# Patient Record
Sex: Female | Born: 1935 | Race: White | Hispanic: No | Smoking: Former smoker
Health system: Southern US, Community
[De-identification: ages and names within clinical notes are randomized; demographics above are authoritative.]

## PROBLEM LIST (undated history)

## (undated) DIAGNOSIS — I82409 Acute embolism and thrombosis of unspecified deep veins of unspecified lower extremity: Secondary | ICD-10-CM

## (undated) DIAGNOSIS — K5730 Diverticulosis of large intestine without perforation or abscess without bleeding: Secondary | ICD-10-CM

## (undated) DIAGNOSIS — I442 Atrioventricular block, complete: Secondary | ICD-10-CM

## (undated) DIAGNOSIS — K5520 Angiodysplasia of colon without hemorrhage: Secondary | ICD-10-CM

## (undated) DIAGNOSIS — E538 Deficiency of other specified B group vitamins: Secondary | ICD-10-CM

## (undated) DIAGNOSIS — R739 Hyperglycemia, unspecified: Secondary | ICD-10-CM

## (undated) DIAGNOSIS — Z860100 Personal history of colon polyps, unspecified: Secondary | ICD-10-CM

## (undated) DIAGNOSIS — F32A Depression, unspecified: Secondary | ICD-10-CM

## (undated) DIAGNOSIS — E119 Type 2 diabetes mellitus without complications: Secondary | ICD-10-CM

## (undated) DIAGNOSIS — I10 Essential (primary) hypertension: Secondary | ICD-10-CM

## (undated) DIAGNOSIS — I714 Abdominal aortic aneurysm, without rupture, unspecified: Secondary | ICD-10-CM

## (undated) DIAGNOSIS — E785 Hyperlipidemia, unspecified: Secondary | ICD-10-CM

## (undated) DIAGNOSIS — J449 Chronic obstructive pulmonary disease, unspecified: Secondary | ICD-10-CM

## (undated) DIAGNOSIS — N390 Urinary tract infection, site not specified: Secondary | ICD-10-CM

## (undated) DIAGNOSIS — I509 Heart failure, unspecified: Secondary | ICD-10-CM

## (undated) DIAGNOSIS — F329 Major depressive disorder, single episode, unspecified: Secondary | ICD-10-CM

## (undated) DIAGNOSIS — K219 Gastro-esophageal reflux disease without esophagitis: Secondary | ICD-10-CM

## (undated) DIAGNOSIS — I4892 Unspecified atrial flutter: Secondary | ICD-10-CM

## (undated) DIAGNOSIS — N179 Acute kidney failure, unspecified: Secondary | ICD-10-CM

## (undated) DIAGNOSIS — F419 Anxiety disorder, unspecified: Secondary | ICD-10-CM

## (undated) HISTORY — DX: Depression, unspecified: F32.A

## (undated) HISTORY — PX: BLADDER SUSPENSION: SHX72

## (undated) HISTORY — DX: Type 2 diabetes mellitus without complications: E11.9

## (undated) HISTORY — DX: Heart failure, unspecified: I50.9

## (undated) HISTORY — DX: Major depressive disorder, single episode, unspecified: F32.9

## (undated) HISTORY — DX: Abdominal aortic aneurysm, without rupture, unspecified: I71.40

## (undated) HISTORY — DX: Essential (primary) hypertension: I10

## (undated) HISTORY — DX: Deficiency of other specified B group vitamins: E53.8

## (undated) HISTORY — PX: APPENDECTOMY: SHX54

## (undated) HISTORY — DX: Atrioventricular block, complete: I44.2

## (undated) HISTORY — PX: ABDOMINAL HYSTERECTOMY: SHX81

## (undated) HISTORY — DX: Unspecified atrial flutter: I48.92

## (undated) HISTORY — DX: Anxiety disorder, unspecified: F41.9

## (undated) HISTORY — DX: Hyperlipidemia, unspecified: E78.5

## (undated) HISTORY — DX: Urinary tract infection, site not specified: N39.0

## (undated) HISTORY — PX: TUBAL LIGATION: SHX77

## (undated) HISTORY — PX: PACEMAKER INSERTION: SHX728

## (undated) HISTORY — DX: Acute kidney failure, unspecified: N17.9

## (undated) HISTORY — DX: Hyperglycemia, unspecified: R73.9

## (undated) HISTORY — DX: Personal history of colonic polyps: Z86.010

## (undated) HISTORY — DX: Abdominal aortic aneurysm, without rupture: I71.4

## (undated) HISTORY — DX: Diverticulosis of large intestine without perforation or abscess without bleeding: K57.30

## (undated) HISTORY — DX: Acute embolism and thrombosis of unspecified deep veins of unspecified lower extremity: I82.409

## (undated) HISTORY — DX: Personal history of colon polyps, unspecified: Z86.0100

## (undated) HISTORY — DX: Chronic obstructive pulmonary disease, unspecified: J44.9

## (undated) HISTORY — DX: Gastro-esophageal reflux disease without esophagitis: K21.9

## (undated) HISTORY — DX: Angiodysplasia of colon without hemorrhage: K55.20

---

## 1997-11-25 ENCOUNTER — Ambulatory Visit (HOSPITAL_COMMUNITY): Admission: RE | Admit: 1997-11-25 | Discharge: 1997-11-25 | Payer: Self-pay | Admitting: Internal Medicine

## 1998-01-23 ENCOUNTER — Ambulatory Visit (HOSPITAL_COMMUNITY): Admission: RE | Admit: 1998-01-23 | Discharge: 1998-01-23 | Payer: Self-pay | Admitting: Gastroenterology

## 1999-02-13 ENCOUNTER — Ambulatory Visit (HOSPITAL_COMMUNITY): Admission: RE | Admit: 1999-02-13 | Discharge: 1999-02-13 | Payer: Self-pay | Admitting: Internal Medicine

## 1999-02-13 ENCOUNTER — Encounter: Payer: Self-pay | Admitting: Internal Medicine

## 2000-02-29 ENCOUNTER — Ambulatory Visit (HOSPITAL_COMMUNITY): Admission: RE | Admit: 2000-02-29 | Discharge: 2000-02-29 | Payer: Self-pay | Admitting: Internal Medicine

## 2000-02-29 ENCOUNTER — Encounter: Payer: Self-pay | Admitting: Internal Medicine

## 2000-09-14 ENCOUNTER — Encounter: Payer: Self-pay | Admitting: Emergency Medicine

## 2000-09-14 ENCOUNTER — Inpatient Hospital Stay (HOSPITAL_COMMUNITY): Admission: EM | Admit: 2000-09-14 | Discharge: 2000-09-17 | Payer: Self-pay | Admitting: Emergency Medicine

## 2000-09-16 ENCOUNTER — Encounter: Payer: Self-pay | Admitting: Internal Medicine

## 2001-03-13 ENCOUNTER — Ambulatory Visit (HOSPITAL_COMMUNITY): Admission: RE | Admit: 2001-03-13 | Discharge: 2001-03-13 | Payer: Self-pay | Admitting: Internal Medicine

## 2001-03-13 ENCOUNTER — Encounter: Payer: Self-pay | Admitting: Internal Medicine

## 2002-02-06 ENCOUNTER — Encounter (INDEPENDENT_AMBULATORY_CARE_PROVIDER_SITE_OTHER): Payer: Self-pay | Admitting: Gastroenterology

## 2002-02-06 HISTORY — PX: COLONOSCOPY: SHX174

## 2002-04-27 ENCOUNTER — Encounter: Payer: Self-pay | Admitting: Internal Medicine

## 2002-04-27 ENCOUNTER — Ambulatory Visit (HOSPITAL_COMMUNITY): Admission: RE | Admit: 2002-04-27 | Discharge: 2002-04-27 | Payer: Self-pay | Admitting: Internal Medicine

## 2002-05-10 ENCOUNTER — Encounter: Admission: RE | Admit: 2002-05-10 | Discharge: 2002-05-10 | Payer: Self-pay | Admitting: Internal Medicine

## 2002-05-10 ENCOUNTER — Encounter: Payer: Self-pay | Admitting: Internal Medicine

## 2002-05-25 ENCOUNTER — Encounter: Admission: RE | Admit: 2002-05-25 | Discharge: 2002-05-25 | Payer: Self-pay | Admitting: Surgery

## 2002-05-25 ENCOUNTER — Encounter: Payer: Self-pay | Admitting: Surgery

## 2002-05-25 ENCOUNTER — Ambulatory Visit (HOSPITAL_BASED_OUTPATIENT_CLINIC_OR_DEPARTMENT_OTHER): Admission: RE | Admit: 2002-05-25 | Discharge: 2002-05-25 | Payer: Self-pay | Admitting: Surgery

## 2002-05-25 ENCOUNTER — Encounter (INDEPENDENT_AMBULATORY_CARE_PROVIDER_SITE_OTHER): Payer: Self-pay | Admitting: Specialist

## 2002-09-30 ENCOUNTER — Inpatient Hospital Stay (HOSPITAL_COMMUNITY): Admission: EM | Admit: 2002-09-30 | Discharge: 2002-10-02 | Payer: Self-pay

## 2003-07-10 ENCOUNTER — Ambulatory Visit (HOSPITAL_COMMUNITY): Admission: RE | Admit: 2003-07-10 | Discharge: 2003-07-10 | Payer: Self-pay | Admitting: Internal Medicine

## 2004-08-04 ENCOUNTER — Ambulatory Visit (HOSPITAL_COMMUNITY): Admission: RE | Admit: 2004-08-04 | Discharge: 2004-08-04 | Payer: Self-pay | Admitting: Internal Medicine

## 2004-09-15 ENCOUNTER — Encounter: Admission: RE | Admit: 2004-09-15 | Discharge: 2004-09-15 | Payer: Self-pay | Admitting: Internal Medicine

## 2005-03-17 ENCOUNTER — Encounter: Admission: RE | Admit: 2005-03-17 | Discharge: 2005-03-17 | Payer: Self-pay | Admitting: Internal Medicine

## 2005-05-28 ENCOUNTER — Encounter: Admission: RE | Admit: 2005-05-28 | Discharge: 2005-05-28 | Payer: Self-pay | Admitting: Orthopedic Surgery

## 2005-06-02 ENCOUNTER — Encounter (INDEPENDENT_AMBULATORY_CARE_PROVIDER_SITE_OTHER): Payer: Self-pay | Admitting: Specialist

## 2005-06-02 ENCOUNTER — Ambulatory Visit (HOSPITAL_BASED_OUTPATIENT_CLINIC_OR_DEPARTMENT_OTHER): Admission: RE | Admit: 2005-06-02 | Discharge: 2005-06-02 | Payer: Self-pay | Admitting: Orthopedic Surgery

## 2005-06-02 ENCOUNTER — Ambulatory Visit (HOSPITAL_COMMUNITY): Admission: RE | Admit: 2005-06-02 | Discharge: 2005-06-02 | Payer: Self-pay | Admitting: Orthopedic Surgery

## 2005-08-30 ENCOUNTER — Ambulatory Visit (HOSPITAL_COMMUNITY): Admission: RE | Admit: 2005-08-30 | Discharge: 2005-08-30 | Payer: Self-pay | Admitting: Internal Medicine

## 2006-05-05 ENCOUNTER — Ambulatory Visit (HOSPITAL_BASED_OUTPATIENT_CLINIC_OR_DEPARTMENT_OTHER): Admission: RE | Admit: 2006-05-05 | Discharge: 2006-05-05 | Payer: Self-pay | Admitting: Orthopedic Surgery

## 2006-07-05 ENCOUNTER — Encounter: Admission: RE | Admit: 2006-07-05 | Discharge: 2006-07-05 | Payer: Self-pay | Admitting: Internal Medicine

## 2006-09-14 ENCOUNTER — Ambulatory Visit (HOSPITAL_COMMUNITY): Admission: RE | Admit: 2006-09-14 | Discharge: 2006-09-14 | Payer: Self-pay | Admitting: Internal Medicine

## 2006-09-28 ENCOUNTER — Encounter: Admission: RE | Admit: 2006-09-28 | Discharge: 2006-09-28 | Payer: Self-pay | Admitting: Internal Medicine

## 2007-03-30 ENCOUNTER — Encounter: Admission: RE | Admit: 2007-03-30 | Discharge: 2007-03-30 | Payer: Self-pay | Admitting: Internal Medicine

## 2007-05-08 ENCOUNTER — Ambulatory Visit: Payer: Self-pay | Admitting: Internal Medicine

## 2007-06-19 ENCOUNTER — Ambulatory Visit (HOSPITAL_COMMUNITY): Admission: RE | Admit: 2007-06-19 | Discharge: 2007-06-19 | Payer: Self-pay | Admitting: Internal Medicine

## 2007-11-09 ENCOUNTER — Ambulatory Visit (HOSPITAL_COMMUNITY): Admission: RE | Admit: 2007-11-09 | Discharge: 2007-11-09 | Payer: Self-pay | Admitting: Internal Medicine

## 2007-11-10 DIAGNOSIS — K5730 Diverticulosis of large intestine without perforation or abscess without bleeding: Secondary | ICD-10-CM | POA: Insufficient documentation

## 2007-11-10 DIAGNOSIS — I714 Abdominal aortic aneurysm, without rupture, unspecified: Secondary | ICD-10-CM | POA: Insufficient documentation

## 2007-11-10 DIAGNOSIS — F341 Dysthymic disorder: Secondary | ICD-10-CM

## 2007-11-10 DIAGNOSIS — J45909 Unspecified asthma, uncomplicated: Secondary | ICD-10-CM | POA: Insufficient documentation

## 2008-05-15 ENCOUNTER — Ambulatory Visit (HOSPITAL_COMMUNITY): Admission: RE | Admit: 2008-05-15 | Discharge: 2008-05-15 | Payer: Self-pay | Admitting: Internal Medicine

## 2008-05-30 ENCOUNTER — Ambulatory Visit: Payer: Self-pay | Admitting: Internal Medicine

## 2008-06-05 ENCOUNTER — Encounter: Admission: RE | Admit: 2008-06-05 | Discharge: 2008-06-05 | Payer: Self-pay | Admitting: Internal Medicine

## 2008-06-12 ENCOUNTER — Encounter: Payer: Self-pay | Admitting: Adult Health

## 2008-06-12 ENCOUNTER — Ambulatory Visit: Payer: Self-pay | Admitting: Internal Medicine

## 2008-06-14 ENCOUNTER — Ambulatory Visit: Payer: Self-pay | Admitting: Cardiovascular Disease

## 2008-06-14 LAB — CONVERTED CEMR LAB
BUN: 14 mg/dL (ref 6–23)
Basophils Absolute: 0 10*3/uL (ref 0.0–0.1)
Chloride: 105 meq/L (ref 96–112)
Eosinophils Absolute: 0.1 10*3/uL (ref 0.0–0.7)
Eosinophils Relative: 1.6 % (ref 0.0–5.0)
GFR calc Af Amer: 47 mL/min
GFR calc non Af Amer: 39 mL/min
HCT: 40.9 % (ref 36.0–46.0)
MCHC: 34.4 g/dL (ref 30.0–36.0)
MCV: 94.5 fL (ref 78.0–100.0)
Monocytes Absolute: 0.5 10*3/uL (ref 0.1–1.0)
Neutrophils Relative %: 63.1 % (ref 43.0–77.0)
Platelets: 110 10*3/uL — ABNORMAL LOW (ref 150–400)
Potassium: 4 meq/L (ref 3.5–5.1)
Pro B Natriuretic peptide (BNP): 669 pg/mL — ABNORMAL HIGH (ref 0.0–100.0)
RDW: 13.3 % (ref 11.5–14.6)

## 2008-06-18 ENCOUNTER — Encounter: Payer: Self-pay | Admitting: Cardiovascular Disease

## 2008-06-18 ENCOUNTER — Ambulatory Visit: Payer: Self-pay | Admitting: Cardiovascular Disease

## 2008-06-18 ENCOUNTER — Inpatient Hospital Stay (HOSPITAL_COMMUNITY): Admission: AD | Admit: 2008-06-18 | Discharge: 2008-06-21 | Payer: Self-pay | Admitting: Cardiovascular Disease

## 2008-06-18 ENCOUNTER — Ambulatory Visit: Payer: Self-pay

## 2008-06-24 ENCOUNTER — Telehealth: Payer: Self-pay | Admitting: Internal Medicine

## 2008-07-04 ENCOUNTER — Ambulatory Visit: Payer: Self-pay

## 2008-07-04 ENCOUNTER — Ambulatory Visit: Payer: Self-pay | Admitting: Cardiovascular Disease

## 2008-07-22 ENCOUNTER — Ambulatory Visit: Payer: Self-pay | Admitting: Internal Medicine

## 2008-07-24 ENCOUNTER — Ambulatory Visit: Payer: Self-pay | Admitting: Internal Medicine

## 2008-07-24 LAB — CONVERTED CEMR LAB
Chloride: 107 meq/L (ref 96–112)
Eosinophils Absolute: 0.1 10*3/uL (ref 0.0–0.7)
GFR calc Af Amer: 44 mL/min
GFR calc non Af Amer: 36 mL/min
HCT: 38 % (ref 36.0–46.0)
INR: 1 (ref 0.8–1.0)
MCV: 94.4 fL (ref 78.0–100.0)
Monocytes Absolute: 0.4 10*3/uL (ref 0.1–1.0)
Monocytes Relative: 7.1 % (ref 3.0–12.0)
Neutrophils Relative %: 61.3 % (ref 43.0–77.0)
Platelets: 137 10*3/uL — ABNORMAL LOW (ref 150–400)
Potassium: 4.4 meq/L (ref 3.5–5.1)
RDW: 13 % (ref 11.5–14.6)
Sodium: 143 meq/L (ref 135–145)
WBC: 6 10*3/uL (ref 4.5–10.5)
aPTT: 26.4 s (ref 21.7–29.8)

## 2008-08-01 ENCOUNTER — Ambulatory Visit: Payer: Self-pay | Admitting: Internal Medicine

## 2008-08-01 ENCOUNTER — Inpatient Hospital Stay (HOSPITAL_COMMUNITY): Admission: RE | Admit: 2008-08-01 | Discharge: 2008-08-02 | Payer: Self-pay | Admitting: Internal Medicine

## 2008-08-14 ENCOUNTER — Ambulatory Visit: Payer: Self-pay

## 2008-08-20 ENCOUNTER — Ambulatory Visit (HOSPITAL_COMMUNITY): Admission: RE | Admit: 2008-08-20 | Discharge: 2008-08-20 | Payer: Self-pay | Admitting: Internal Medicine

## 2008-08-26 ENCOUNTER — Encounter: Payer: Self-pay | Admitting: Internal Medicine

## 2008-09-02 ENCOUNTER — Encounter: Admission: RE | Admit: 2008-09-02 | Discharge: 2008-09-02 | Payer: Self-pay | Admitting: Internal Medicine

## 2008-09-23 ENCOUNTER — Encounter (INDEPENDENT_AMBULATORY_CARE_PROVIDER_SITE_OTHER): Payer: Self-pay | Admitting: *Deleted

## 2008-10-15 ENCOUNTER — Ambulatory Visit: Payer: Self-pay | Admitting: Internal Medicine

## 2008-10-25 ENCOUNTER — Ambulatory Visit: Payer: Self-pay | Admitting: Internal Medicine

## 2008-11-19 ENCOUNTER — Telehealth: Payer: Self-pay | Admitting: Internal Medicine

## 2008-12-17 ENCOUNTER — Ambulatory Visit: Payer: Self-pay | Admitting: Internal Medicine

## 2008-12-17 DIAGNOSIS — Z860100 Personal history of colon polyps, unspecified: Secondary | ICD-10-CM | POA: Insufficient documentation

## 2009-01-01 ENCOUNTER — Telehealth: Payer: Self-pay | Admitting: Internal Medicine

## 2009-01-22 DIAGNOSIS — I509 Heart failure, unspecified: Secondary | ICD-10-CM | POA: Insufficient documentation

## 2009-01-22 DIAGNOSIS — I442 Atrioventricular block, complete: Secondary | ICD-10-CM

## 2009-01-22 DIAGNOSIS — Z950 Presence of cardiac pacemaker: Secondary | ICD-10-CM | POA: Insufficient documentation

## 2009-01-23 ENCOUNTER — Encounter (INDEPENDENT_AMBULATORY_CARE_PROVIDER_SITE_OTHER): Payer: Self-pay | Admitting: *Deleted

## 2009-01-23 ENCOUNTER — Ambulatory Visit: Payer: Self-pay | Admitting: Internal Medicine

## 2009-02-12 ENCOUNTER — Encounter: Payer: Self-pay | Admitting: Internal Medicine

## 2009-04-08 ENCOUNTER — Ambulatory Visit (HOSPITAL_COMMUNITY): Admission: RE | Admit: 2009-04-08 | Discharge: 2009-04-08 | Payer: Self-pay | Admitting: Internal Medicine

## 2009-05-05 ENCOUNTER — Encounter: Payer: Self-pay | Admitting: Internal Medicine

## 2009-05-05 ENCOUNTER — Ambulatory Visit (HOSPITAL_COMMUNITY): Admission: RE | Admit: 2009-05-05 | Discharge: 2009-05-05 | Payer: Self-pay | Admitting: Internal Medicine

## 2009-05-07 ENCOUNTER — Ambulatory Visit (HOSPITAL_COMMUNITY): Admission: RE | Admit: 2009-05-07 | Discharge: 2009-05-07 | Payer: Self-pay | Admitting: Internal Medicine

## 2009-05-07 ENCOUNTER — Encounter: Payer: Self-pay | Admitting: Internal Medicine

## 2009-05-08 ENCOUNTER — Ambulatory Visit: Payer: Self-pay

## 2009-05-08 ENCOUNTER — Encounter: Payer: Self-pay | Admitting: Internal Medicine

## 2009-05-09 ENCOUNTER — Telehealth: Payer: Self-pay | Admitting: Internal Medicine

## 2009-05-15 ENCOUNTER — Ambulatory Visit: Payer: Self-pay | Admitting: Internal Medicine

## 2009-05-27 ENCOUNTER — Encounter: Admission: RE | Admit: 2009-05-27 | Discharge: 2009-05-27 | Payer: Self-pay | Admitting: Internal Medicine

## 2009-06-26 ENCOUNTER — Ambulatory Visit: Payer: Self-pay | Admitting: Internal Medicine

## 2009-07-30 ENCOUNTER — Telehealth: Payer: Self-pay | Admitting: Internal Medicine

## 2009-08-01 ENCOUNTER — Inpatient Hospital Stay (HOSPITAL_COMMUNITY): Admission: EM | Admit: 2009-08-01 | Discharge: 2009-08-02 | Payer: Self-pay | Admitting: Emergency Medicine

## 2009-08-01 ENCOUNTER — Ambulatory Visit: Payer: Self-pay | Admitting: Vascular Surgery

## 2009-08-01 ENCOUNTER — Encounter (INDEPENDENT_AMBULATORY_CARE_PROVIDER_SITE_OTHER): Payer: Self-pay | Admitting: Emergency Medicine

## 2009-08-08 ENCOUNTER — Telehealth: Payer: Self-pay | Admitting: Cardiovascular Disease

## 2009-09-03 ENCOUNTER — Encounter: Payer: Self-pay | Admitting: Internal Medicine

## 2009-09-03 ENCOUNTER — Ambulatory Visit: Payer: Self-pay | Admitting: Vascular Surgery

## 2009-10-07 ENCOUNTER — Ambulatory Visit: Payer: Self-pay | Admitting: Internal Medicine

## 2009-10-08 LAB — CONVERTED CEMR LAB
Chloride: 102 meq/L (ref 96–112)
GFR calc non Af Amer: 35.9 mL/min (ref >60)
Potassium: 3.7 meq/L (ref 3.5–5.1)
Pro B Natriuretic peptide (BNP): 579 pg/mL — ABNORMAL HIGH (ref 0.0–100.0)
Sodium: 142 meq/L (ref 135–145)

## 2009-11-26 ENCOUNTER — Ambulatory Visit: Payer: Self-pay | Admitting: Cardiology

## 2010-04-01 ENCOUNTER — Ambulatory Visit: Payer: Self-pay

## 2010-04-01 ENCOUNTER — Encounter: Payer: Self-pay | Admitting: Internal Medicine

## 2010-06-01 ENCOUNTER — Ambulatory Visit (HOSPITAL_COMMUNITY): Admission: RE | Admit: 2010-06-01 | Discharge: 2010-06-01 | Payer: Self-pay | Admitting: Internal Medicine

## 2010-09-13 ENCOUNTER — Encounter: Payer: Self-pay | Admitting: Internal Medicine

## 2010-09-22 NOTE — Procedures (Signed)
Summary: DEVICE/SAF   Current Medications (verified): 1)  Aspirin Adult Low Strength 81 Mg Tbec (Aspirin) .... Once Daily 2)  Singulair 10 Mg Tabs (Montelukast Sodium) .Marland Kitchen.. 1 Once Daily 3)  Nexium 40 Mg Cpdr (Esomeprazole Magnesium) .... Take  One 30-60 Min Before First Meal of The Day 4)  Simvastatin 80 Mg Tabs (Simvastatin) .... 1/2 Tablet On Mon Wed and Fri 5)  Paroxetine Hcl 20 Mg Tabs (Paroxetine Hcl) .... One Tablet Once Daily 6)  Benicar Hct 20-12.5 Mg Tabs (Olmesartan Medoxomil-Hctz) .Marland Kitchen.. 1 Once Daily 7)  Vitamin D 2000 Unit Tabs (Cholecalciferol) .... Two Times A Day 8)  Spiriva Handihaler 18 Mcg Caps (Tiotropium Bromide Monohydrate) .... Inhale Contents of 1 Capsule Daily 9)  Duoneb 0.5-2.5 (3) Mg/18ml Soln (Ipratropium-Albuterol) .Marland Kitchen.. 1 Vial Two Times A Day 10)  Warfarin Sodium 5 Mg Tabs (Warfarin Sodium) .... Use As Directed By Anticoagulation Clinic 11)  Ventolin Hfa 108 (90 Base) Mcg/act Aers (Albuterol Sulfate) .... As Needed  Allergies (verified): 1)  ! * Mycins 2)  ! Sulfa  PPM Specifications Following MD:  Lewayne Bunting, MD     PPM Vendor:  Medtronic     PPM Model Number:  [id number]     PPM Serial Number:  [id number] PPM DOI:  06/19/2008      Lead 1    Location: RA     DOI: 06/19/2008     Model #: 4696     Serial #: [id number]     Status: active Lead 2    Location: RV     DOI: 06/19/2008     Model #: 4010     Serial #: [id number]     Status: active Lead 3    Location: LV     DOI: 06/19/2008     Model #: 3474     Serial #: [id number]     Status: active  Magnet Response Rate:  BOL 85 ERI  65  Indications:  CHF  Explantation Comments:  LV LEAD OFF Pacemaker dependent  PPM Follow Up Battery Voltage:  3.005 V     Battery Est. Longevity:  7 yrs     Pacer Dependent:  Yes       PPM Device Measurements Atrium  Amplitude: 2.80 mV, Impedance: 432 ohms, Threshold: 1.00 V at 0.40 msec Right Ventricle  Amplitude: 11.20 mV, Impedance: 560 ohms, Threshold: 0.50 V at 0.40  msec  Episodes MS Episodes:  4     Percent Mode Switch:  0%     Coumadin:  Yes Ventricular High Rate:  0     Atrial Pacing:  1.6%     Ventricular Pacing:  98.4%  Parameters Mode:  DDD     Lower Rate Limit:  50     Upper Rate Limit:  130 Paced AV Delay:  310     Sensed AV Delay:  280 Next Cardiology Appt Due:  09/23/2010 Tech Comments:  4 AHR EPISODES--LONGEST WAS 27 MINUTES 28 SECONDS. + COUMADIN.  NORMAL DEVICE FUNCTION.  NO CHANGES MADE.  ROV IN 6 MTHS W/GT. Vella Kohler  April 01, 2010 11:26 AM

## 2010-09-22 NOTE — Cardiovascular Report (Signed)
Summary: Office Visit   Office Visit   Imported By: Roderic Ovens 04/23/2010 15:49:03  _____________________________________________________________________  External Attachment:    Type:   Image     Comment:   External Document

## 2010-09-22 NOTE — Assessment & Plan Note (Signed)
Summary: [facility]   Referring Provider:  Dr. Oneta Rack Primary Provider:  Lucky Cowboy, MD   History of Present Illness: The patient presents for followup of her cardiomyopathy. She has a history of heart block as well and has a biventricular pacemaker. She is very limited by dyspnea but has chronic severe lung disease. She is switching her primary cardiology care here as she lives in this area. She continues to be dyspneic with mild activity and wears oxygen almost 24 hours a day. She had no change in his isn't describing any PND or orthopnea. She is not describing any chest discomfort, neck or arm discomfort. She is not describing any new PND or orthopnea. She does have fatigue which is chronic.  Of note the patient offered to me that she is profoundly depressed. She reports not wanting to leave the house and not "caring about her family". She does not express any suicidal thoughts however.  Current Medications (verified): 1)  Aspirin Adult Low Strength 81 Mg Tbec (Aspirin) .... Once Daily 2)  Singulair 10 Mg Tabs (Montelukast Sodium) .Marland Kitchen.. 1 Once Daily 3)  Nexium 40 Mg Cpdr (Esomeprazole Magnesium) .... Take  One 30-60 Min Before First Meal of The Day 4)  Simvastatin 80 Mg Tabs (Simvastatin) .... 1/2 Tablet On Mon Wed and Fri 5)  Paroxetine Hcl 20 Mg Tabs (Paroxetine Hcl) .... One Tablet Once Daily 6)  Benicar Hct 20-12.5 Mg Tabs (Olmesartan Medoxomil-Hctz) .Marland Kitchen.. 1 Once Daily 7)  Vitamin D 2000 Unit Tabs (Cholecalciferol) .... Two Times A Day 8)  Spiriva Handihaler 18 Mcg Caps (Tiotropium Bromide Monohydrate) .... Inhale Contents of 1 Capsule Daily 9)  Duoneb 0.5-2.5 (3) Mg/89ml Soln (Ipratropium-Albuterol) .Marland Kitchen.. 1 Vial Two Times A Day 10)  Warfarin Sodium 5 Mg Tabs (Warfarin Sodium) .... Use As Directed By Anticoagulation Clinic 11)  Ventolin Hfa 108 (90 Base) Mcg/act Aers (Albuterol Sulfate) .... As Needed  Allergies (verified): 1)  ! * Mycins 2)  ! Sulfa  Past  History:  Past Medical History: Reviewed history from 01/22/2009 and no changes required. AV BLOCK, COMPLETE (ICD-426.0) CONGESTIVE HEART FAILURE, UNSPECIFIED (ICD-428.0) PACEMAKER, PERMANENT (ICD-V45.01) COPD UNSPECIFIED (ICD-496) ABDOMINAL AORTIC ANEURYSM (ICD-441.4) DYSLIPIDEMIA (ICD-272.4) HYPERTENSION (ICD-401.9) PERSONAL HX COLONIC POLYPS (ICD-V12.72) ANXIETY DEPRESSION (ICD-300.4) DIABETES MELLITUS (ICD-250.00) GERD (ICD-530.81) DIVERTICULOSIS, COLON (ICD-562.10) NEOPLASM, MALIGNANT, COLON, FAMILY HX, SIBLING (ICD-V16.0) ASTHMA (ICD-493.90)    Past Surgical History: Reviewed history from 01/22/2009 and no changes required. Tubal Ligation Hysterectomy w/ bladder suspension Appendectomy  Review of Systems       As stated in the HPI and negative for all other systems.   Vital Signs:  Patient profile:   77 year old female Height:      63 inches Weight:      192 pounds BMI:     34.13 Pulse rate:   91 / minute Resp:     20 per minute BP sitting:   104 / 58  (right arm)  Vitals Entered By: Marrion Coy, CNA (November 26, 2009 11:48 AM)  Physical Exam  General:  chronically ill-appearing in no acute distress Head:  normocephalic and atraumatic Eyes:  PERRLA/EOM intact; conjunctiva and lids normal. Neck:  Neck supple, no JVD. No masses, thyromegaly or abnormal cervical nodes. Chest Wall:  well-healed ICD Lungs:  decreased breath sounds with no wheezes or crackles Heart:  distant heart sounds, S1 and S2 within normal limits, no S3, no S4, no clicks, no rubs, no murmu Abdomen:  Bowel sounds positive; abdomen soft and non-tender  without masses, organomegaly, or hernias noted. No hepatosplenomegaly. Msk:  Back normal, normal gait. Muscle strength and tone normal. Extremities:  No clubbing or cyanosis. Neurologic:  Alert and oriented x 3. Skin:  Intact without lesions or rashes. Cervical Nodes:  no significant adenopathy Axillary Nodes:  no significant  adenopathy Inguinal Nodes:  no significant adenopathy Psych:  Normal affect.   EKG  Procedure date:  11/26/2009  Findings:      sinus rhythm, rate 91, ventricular pacing 100% capture  PPM Specifications Following MD:  Lewayne Bunting, MD     PPM Vendor:  [employer]     PPM Model Number:  [id number]     PPM Serial Number:  [id number] PPM DOI:  06/19/2008      Lead 1    Location: RA     DOI: 06/19/2008     Model #: 1191     Serial #: [id number]     Status: active Lead 2    Location: RV     DOI: 06/19/2008     Model #: 0865     Serial #: [id number]     Status: active Lead 3    Location: LV     DOI: 06/19/2008     Model #: 8413     Serial #: [id number]     Status: active  Magnet Response Rate:  BOL 85 ERI  65  Indications:  CHF  Explantation Comments:  LV LEAD OFF Pacemaker dependent  PPM Follow Up Pacer Dependent:  Yes      Episodes Coumadin:  Yes  Parameters Mode:  DDD     Lower Rate Limit:  50     Paced AV Delay:  310     Sensed AV Delay:  280  Impression & Recommendations:  Problem # 1:  CONGESTIVE HEART FAILURE, UNSPECIFIED (ICD-428.0) Her ejection fraction was mild to moderately reduced at the last evaluation. I don't believe she's hypervolemic. At this point and made no change to her cardiac regimen.  Problem # 2:  COPD UNSPECIFIED (ICD-496) It appears that her dyspnea is very likely in large part related to this. She is continuing pulmonary therapy as above. She no longer sees a pulmonologist as there was nothing left to offer apparently.  Of note her LV lead is turned off apparently secondary to diaphragmatic stimulation  Problem # 3:  ANXIETY DEPRESSION (ICD-300.4) The patient discussed her profound depression. I called her primary care physician today and reported this to him and he will notify the patient and consider changing her antidepressant therapy.  Problem # 4:  AV BLOCK, COMPLETE (ICD-426.0) She has followup in our pacemaker clinic. Tolerates  Coumadin. Orders: EKG w/ Interpretation (93000)  Patient Instructions: 1)  Your physician recommends that you schedule a follow-up appointment in: 6 months with Dr Antoine Poche in [place] 2)  Your physician recommends that you continue on your current medications as directed. Please refer to the Current Medication list given to you today.

## 2010-09-22 NOTE — Letter (Signed)
Summary: [facility]   Imported By: Roderic Ovens 09/17/2009 14:19:50  _____________________________________________________________________  External Attachment:    Type:   Image     Comment:   External Document

## 2010-09-22 NOTE — Assessment & Plan Note (Signed)
Summary: DEVICE/SAF   Referring Provider:  Dr. Oneta Rack Primary Provider:  Lucky Cowboy, MD   History of Present Illness: [name] returns today for followup.  She is a 77 yo woman with a h/o HTN, obesity, CHF, and COPD.  She has continued to have problems with SOB.  The patient has a h/o CHB and underwent insertion of a BiV PM over a year ago.  She has noted worsening SOB.  She had diaphragmatic stimulation and I turned her LV lead.  The patient has remained dyspneic.  She has been placed on Home oxygen.  She was placed on diuretics but these were stopped as she has worsening renal insufficiency.  Current Medications (verified): 1)  Aspirin Adult Low Strength 81 Mg Tbec (Aspirin) .... Once Daily 2)  Singulair 10 Mg Tabs (Montelukast Sodium) .Marland Kitchen.. 1 Once Daily 3)  Nexium 40 Mg Cpdr (Esomeprazole Magnesium) .... Take  One 30-60 Min Before First Meal of The Day 4)  Simvastatin 80 Mg Tabs (Simvastatin) .... 1/2 Tablet On Mon Wed and Fri 5)  Paroxetine Hcl 20 Mg Tabs (Paroxetine Hcl) .... One Tablet Once Daily 6)  Benicar Hct 20-12.5 Mg Tabs (Olmesartan Medoxomil-Hctz) .Marland Kitchen.. 1 Once Daily 7)  Vitamin D 2000 Unit Tabs (Cholecalciferol) .... Two Times A Day 8)  Spiriva Handihaler 18 Mcg Caps (Tiotropium Bromide Monohydrate) .... Inhale Contents of 1 Capsule Daily 9)  Duoneb 0.5-2.5 (3) Mg/45ml Soln (Ipratropium-Albuterol) .Marland Kitchen.. 1 Vial Two Times A Day 10)  Furosemide 40 Mg Tabs (Furosemide) .Marland Kitchen.. 1  Tablet Daily 11)  Fish Oil   Oil (Fish Oil) .... 1000mg  Once Daily 12)  Prednisone 10 Mg Tabs (Prednisone) .Marland Kitchen.. 1 Tab Once Daily 13)  Warfarin Sodium 5 Mg Tabs (Warfarin Sodium) .... Use As Directed By Anticoagulation Clinic  Allergies (verified): 1)  ! * Mycins 2)  ! Sulfa  Past History:  Past Medical History: Last updated: 01/22/2009 AV BLOCK, COMPLETE (ICD-426.0) CONGESTIVE HEART FAILURE, UNSPECIFIED (ICD-428.0) PACEMAKER, PERMANENT (ICD-V45.01) COPD UNSPECIFIED (ICD-496) ABDOMINAL AORTIC  ANEURYSM (ICD-441.4) DYSLIPIDEMIA (ICD-272.4) HYPERTENSION (ICD-401.9) PERSONAL HX COLONIC POLYPS (ICD-V12.72) ANXIETY DEPRESSION (ICD-300.4) DIABETES MELLITUS (ICD-250.00) GERD (ICD-530.81) DIVERTICULOSIS, COLON (ICD-562.10) NEOPLASM, MALIGNANT, COLON, FAMILY HX, SIBLING (ICD-V16.0) ASTHMA (ICD-493.90)    Past Surgical History: Last updated: 01/22/2009 Tubal Ligation Hysterectomy w/ bladder suspension Appendectomy  Vital Signs:  Patient profile:   77 year old female Height:      63 inches Weight:      194 pounds BMI:     34.49 Pulse rate:   96 / minute BP sitting:   121 / 76  (right arm) Cuff size:   regular  Vitals Entered By: Hardin Negus, RMA (October 07, 2009 2:27 PM)  Physical Exam  General:  Well developed, well nourished, in no acute distress. Head:  normocephalic and atraumatic Eyes:  PERRLA/EOM intact; conjunctiva and lids normal. Mouth:  Teeth, gums and palate normal. Oral mucosa normal. Neck:  Neck supple, no JVD. No masses, thyromegaly or abnormal cervical nodes. Chest Wall:  Well healed PPM incision. Lungs:  Scattered rales in the bases, without wheezes, rhonchi or increased work of breathing. Heart:  RRR with normal S1 and a split S2.  PMI is enlarged and laterally displaced. Abdomen:  Bowel sounds positive; abdomen soft and non-tender without masses, organomegaly, or hernias noted. No hepatosplenomegaly. Pulses:  pulses normal in all 4 extremities Extremities:  No clubbing or cyanosis.1+ edema bilaterally. Neurologic:  Alert and oriented x 3.   PPM Specifications Following MD:  Lewayne Bunting, MD  PPM Vendor:  [employer]     PPM Model Number:  [id number]     PPM Serial Number:  [id number] PPM DOI:  06/19/2008      Lead 1    Location: RA     DOI: 06/19/2008     Model #: 3474     Serial #: [id number]     Status: active Lead 2    Location: RV     DOI: 06/19/2008     Model #: 4332     Serial #: [id number]     Status: active Lead 3    Location: LV     DOI:  06/19/2008     Model #: 6301     Serial #: [id number]     Status: active  Magnet Response Rate:  BOL 85 ERI  65  Indications:  CHF  Explantation Comments:  LV LEAD OFF Pacemaker dependent  PPM Follow Up Remote Check?  No Battery Voltage:  3.002 V     Battery Est. Longevity:  6.5 years     Pacer Dependent:  Yes       PPM Device Measurements Atrium  Amplitude: 2.8 mV, Impedance: 409 ohms, Threshold: 1.5 V at 0.4 msec Right Ventricle  Impedance: 609 ohms, Threshold: 1.0 V at 0.4 msec  Episodes MS Episodes:  3     Percent Mode Switch:  0%     Coumadin:  Yes Ventricular High Rate:  0     Atrial Pacing:  1.3%     Ventricular Pacing:  99.1%  Parameters Mode:  DDD     Lower Rate Limit:  50     Paced AV Delay:  310     Sensed AV Delay:  280 Next Cardiology Appt Due:  12/21/2009 Tech Comments:  No parameter changes.  Device function normal.  ROV 6 months clinic. Altha Harm, LPN  October 07, 2009 2:35 PM  MD Comments:  Agree with above.  Her LV lead remains turned off.  Impression & Recommendations:  Problem # 1:  CONGESTIVE HEART FAILURE, UNSPECIFIED (ICD-428.0) Her CHF symptoms are more severe today.  I have asked her to take lasix for 3 days and then as needed.  She states that she has not been taking her lasix since I last saw her secondary to renal insufficiency.  See plan below. Her updated medication list for this problem includes:    Aspirin Adult Low Strength 81 Mg Tbec (Aspirin) ..... Once daily    Benicar Hct 20-12.5 Mg Tabs (Olmesartan medoxomil-hctz) .Marland Kitchen... 1 once daily    Furosemide 40 Mg Tabs (Furosemide) .Marland Kitchen... 1  tablet daily    Warfarin Sodium 5 Mg Tabs (Warfarin sodium) ..... Use as directed by anticoagulation clinic  Orders: TLB-BMP (Basic Metabolic Panel-BMET) (80048-METABOL) TLB-BNP (B-Natriuretic Peptide) (83880-BNPR) CHF Clinic  (CHF Clinic)  Problem # 2:  PACEMAKER, PERMANENT (ICD-V45.01) Her LV lead remains deactivated.  Will followup. With her other  comorbidities, I am not inclined to try at this time to revise her LV lead.  Problem # 3:  HYPERTENSION (ICD-401.9) A low sodium diet is recommended. Her updated medication list for this problem includes:    Aspirin Adult Low Strength 81 Mg Tbec (Aspirin) ..... Once daily    Benicar Hct 20-12.5 Mg Tabs (Olmesartan medoxomil-hctz) .Marland Kitchen... 1 once daily    Furosemide 40 Mg Tabs (Furosemide) .Marland Kitchen... 1  tablet daily  Orders: TLB-BMP (Basic Metabolic Panel-BMET) (80048-METABOL) TLB-BNP (B-Natriuretic Peptide) (83880-BNPR)  Patient Instructions: 1)  Your physician recommends that you schedule  a follow-up appointment in: 6 months with device clinic and 12 months with DR Ladona Ridgel 2)  Your physician has recommended you make the following change in your medication: take your Furosemide one daily for the next 3 days and we will call with further insrtuctions 3)  Your physician recommends that you return for lab work in:BMP/BNP 4)  An appointment for you to see the CHF Clinic has been scheduled. Please keep your appointment or be sure to call if you need to reschedule.  Dr Michael Boston wants to be seen in [facility]

## 2010-09-22 NOTE — Cardiovascular Report (Signed)
Summary: Office Visit   Office Visit   Imported By: Roderic Ovens 10/13/2009 16:26:07  _____________________________________________________________________  External Attachment:    Type:   Image     Comment:   External Document

## 2010-10-13 ENCOUNTER — Encounter (INDEPENDENT_AMBULATORY_CARE_PROVIDER_SITE_OTHER): Payer: Medicare Other | Admitting: Internal Medicine

## 2010-10-13 ENCOUNTER — Encounter: Payer: Self-pay | Admitting: Internal Medicine

## 2010-10-13 DIAGNOSIS — I10 Essential (primary) hypertension: Secondary | ICD-10-CM

## 2010-10-13 DIAGNOSIS — I5032 Chronic diastolic (congestive) heart failure: Secondary | ICD-10-CM

## 2010-10-13 DIAGNOSIS — I4430 Unspecified atrioventricular block: Secondary | ICD-10-CM

## 2010-10-13 DIAGNOSIS — E782 Mixed hyperlipidemia: Secondary | ICD-10-CM

## 2010-10-14 ENCOUNTER — Telehealth (INDEPENDENT_AMBULATORY_CARE_PROVIDER_SITE_OTHER): Payer: Self-pay | Admitting: *Deleted

## 2010-10-20 NOTE — Assessment & Plan Note (Signed)
Summary: per check out/sf   Referring Provider:  Dr. Oneta Rack Primary Provider:  Lucky Cowboy, MD   History of Present Illness: [name] returns today for followup.  She is a 77 yo woman with a h/o HTN, obesity, CHF, and COPD.  She has continued to have problems with SOB.  The patient has a h/o CHB and underwent insertion of a BiV PM over a year ago.  She has noted worsening SOB.  She had diaphragmatic stimulation and I turned her LV lead.  The patient has remained dyspneic.  She has been placed on Home oxygen.  She was placed on diuretics but these were stopped as she has worsening renal insufficiency. On oxygen, her sats usually are maintained in the 90's at rest but drop lower with exertion.   Current Medications (verified): 1)  Aspirin Adult Low Strength 81 Mg Tbec (Aspirin) .... Once Daily 2)  Singulair 10 Mg Tabs (Montelukast Sodium) .Marland Kitchen.. 1 Once Daily 3)  Simvastatin 80 Mg Tabs (Simvastatin) .... 1/2 Tablet On Mon Wed and Fri 4)  Paroxetine Hcl 20 Mg Tabs (Paroxetine Hcl) .... One Tablet Once Daily 5)  Benicar Hct 20-12.5 Mg Tabs (Olmesartan Medoxomil-Hctz) .Marland Kitchen.. 1 Once Daily 6)  Vitamin D 2000 Unit Tabs (Cholecalciferol) .... Once Daily 7)  Spiriva Handihaler 18 Mcg Caps (Tiotropium Bromide Monohydrate) .... Inhale Contents of 1 Capsule Daily 8)  Duoneb 0.5-2.5 (3) Mg/42ml Soln (Ipratropium-Albuterol) .Marland Kitchen.. 1 Vial Two Times A Day 9)  Warfarin Sodium 5 Mg Tabs (Warfarin Sodium) .... Use As Directed By Anticoagulation Clinic 10)  Ventolin Hfa 108 (90 Base) Mcg/act Aers (Albuterol Sulfate) .... As Needed  Allergies: 1)  ! * Mycins 2)  ! Sulfa  Past History:  Past Medical History: Last updated: 01/22/2009 AV BLOCK, COMPLETE (ICD-426.0) CONGESTIVE HEART FAILURE, UNSPECIFIED (ICD-428.0) PACEMAKER, PERMANENT (ICD-V45.01) COPD UNSPECIFIED (ICD-496) ABDOMINAL AORTIC ANEURYSM (ICD-441.4) DYSLIPIDEMIA (ICD-272.4) HYPERTENSION (ICD-401.9) PERSONAL HX COLONIC POLYPS  (ICD-V12.72) ANXIETY DEPRESSION (ICD-300.4) DIABETES MELLITUS (ICD-250.00) GERD (ICD-530.81) DIVERTICULOSIS, COLON (ICD-562.10) NEOPLASM, MALIGNANT, COLON, FAMILY HX, SIBLING (ICD-V16.0) ASTHMA (ICD-493.90)    Past Surgical History: Last updated: 01/22/2009 Tubal Ligation Hysterectomy w/ bladder suspension Appendectomy  Review of Systems       The patient complains of dyspnea on exertion and peripheral edema.  The patient denies chest pain and syncope.    Vital Signs:  Patient profile:   77 year old female Height:      63 inches Weight:      182 pounds BMI:     32.36 Pulse rate:   96 / minute BP sitting:   120 / 60  (right arm)  Vitals Entered By: Laurance Flatten CMA (October 13, 2010 2:26 PM)  Physical Exam  General:  chronically ill-appearing in no acute distress Head:  normocephalic and atraumatic Eyes:  PERRLA/EOM intact; conjunctiva and lids normal. Mouth:  Teeth, gums and palate normal. Oral mucosa normal. Neck:  Neck supple, no JVD. No masses, thyromegaly or abnormal cervical nodes. Chest Wall:  well-healed ICD Lungs:  decreased breath sounds with no wheezes or crackles Heart:  distant heart sounds, S1 and S2 within normal limits, no S3, no S4, no clicks, no rubs, no murmu Abdomen:  Bowel sounds positive; abdomen soft and non-tender without masses, organomegaly, or hernias noted. No hepatosplenomegaly. Msk:  Back normal, normal gait. Muscle strength and tone normal. Pulses:  pulses normal in all 4 extremities Extremities:  No clubbing or cyanosis. Neurologic:  Alert and oriented x 3.   PPM Specifications Following MD:  [name] Gowda  Ladona Ridgel, MD     PPM Vendor:  [employer]     PPM Model Number:  [id number]     PPM Serial Number:  [id number] PPM DOI:  06/19/2008      Lead 1    Location: RA     DOI: 06/19/2008     Model #: 5076     Serial #: [id number]     Status: active Lead 2    Location: RV     DOI: 06/19/2008     Model #: 8657     Serial #: [id number]     Status: active Lead  3    Location: LV     DOI: 06/19/2008     Model #: 4132     Serial #: [id number]     Status: active  Magnet Response Rate:  BOL 85 ERI  65  Indications:  CHF  Explantation Comments:  LV LEAD OFF Pacemaker dependent  PPM Follow Up Pacer Dependent:  Yes      Episodes Coumadin:  Yes  Parameters Mode:  DDD     Lower Rate Limit:  50     Upper Rate Limit:  130 Paced AV Delay:  310     Sensed AV Delay:  280 MD Comments:  Normal device function except her LV lead remains off.  Impression & Recommendations:  Problem # 1:  PACEMAKER, PERMANENT (ICD-V45.01) Her PPM is stable. Her LV lead is turned off. I have recommended she follow up in several months.  Problem # 2:  CONGESTIVE HEART FAILURE, UNSPECIFIED (ICD-428.0) She does have class 3 dyspnea but I suspect that much of this is due to her COPD. She will continue meds as below. She has been intolerant to beta blockers previously. Her updated medication list for this problem includes:    Aspirin Adult Low Strength 81 Mg Tbec (Aspirin) ..... Once daily    Benicar Hct 20-12.5 Mg Tabs (Olmesartan medoxomil-hctz) .Marland Kitchen... 1 once daily    Warfarin Sodium 5 Mg Tabs (Warfarin sodium) ..... Use as directed by anticoagulation clinic  Problem # 3:  HYPERTENSION (ICD-401.9) She will continue a low sodium diet and her meds as below. Her updated medication list for this problem includes:    Aspirin Adult Low Strength 81 Mg Tbec (Aspirin) ..... Once daily    Benicar Hct 20-12.5 Mg Tabs (Olmesartan medoxomil-hctz) .Marland Kitchen... 1 once daily  Patient Instructions: 1)  Your physician wants you to follow-up in: 6 months in device clinic and 12 months with Dr Court [name] will receive a reminder letter in the mail two months in advance. If you don't receive a letter, please call our office to schedule the follow-up appointment. 2)  Your physician recommends that you continue on your current medications as directed. Please refer to the Current Medication list given to  you today.

## 2010-10-20 NOTE — Progress Notes (Signed)
Summary: pt having thumping in her chest  Phone Note Call from Patient   Caller: Patient Reason for Call: Talk to Nurse, Talk to Doctor Summary of Call: pt has a thumping in her side and she thinks it's her pacer and she is concerned no other symptoms Initial call taken by: Omer Jack,  October 14, 2010 12:17 PM  Follow-up for Phone Call        spoke w/pt --pt's device was checked yesterday and no changes were made.  pt aware to keep close check on thumping and call if gets worse. Vella Kohler  October 14, 2010 2:20 PM

## 2010-10-29 NOTE — Cardiovascular Report (Signed)
Summary: Office Visit   Office Visit   Imported By: Roderic Ovens 10/23/2010 12:50:39  _____________________________________________________________________  External Attachment:    Type:   Image     Comment:   External Document

## 2010-11-04 ENCOUNTER — Encounter (INDEPENDENT_AMBULATORY_CARE_PROVIDER_SITE_OTHER): Payer: Medicare Other

## 2010-11-04 ENCOUNTER — Encounter: Payer: Self-pay | Admitting: Internal Medicine

## 2010-11-04 DIAGNOSIS — I4430 Unspecified atrioventricular block: Secondary | ICD-10-CM

## 2010-11-10 NOTE — Procedures (Signed)
Summary: mdt ppm/pt hving left side thumping   Current Medications (verified): 1)  Aspirin Adult Low Strength 81 Mg Tbec (Aspirin) .... Once Daily 2)  Singulair 10 Mg Tabs (Montelukast Sodium) .Marland Kitchen.. 1 Once Daily 3)  Simvastatin 80 Mg Tabs (Simvastatin) .... 1/2 Tablet On Mon Wed and Fri 4)  Paroxetine Hcl 20 Mg Tabs (Paroxetine Hcl) .... One Tablet Once Daily 5)  Benicar Hct 20-12.5 Mg Tabs (Olmesartan Medoxomil-Hctz) .Marland Kitchen.. 1 Once Daily 6)  Vitamin D 2000 Unit Tabs (Cholecalciferol) .... Once Daily 7)  Spiriva Handihaler 18 Mcg Caps (Tiotropium Bromide Monohydrate) .... Inhale Contents of 1 Capsule Daily 8)  Duoneb 0.5-2.5 (3) Mg/51ml Soln (Ipratropium-Albuterol) .Marland Kitchen.. 1 Vial Two Times A Day 9)  Warfarin Sodium 5 Mg Tabs (Warfarin Sodium) .... Use As Directed By Anticoagulation Clinic 10)  Ventolin Hfa 108 (90 Base) Mcg/act Aers (Albuterol Sulfate) .... As Needed  Allergies (verified): 1)  ! * Mycins 2)  ! Sulfa  PPM Specifications Following MD:  Lewayne Bunting, MD     PPM Vendor:  Medtronic     PPM Model Number:  [id number]     PPM Serial Number:  [id number] PPM DOI:  06/19/2008      Lead 1    Location: RA     DOI: 06/19/2008     Model #: [id number]     Serial #: [id number]     Status: active Lead 2    Location: RV     DOI: 06/19/2008     Model #: [id number]     Serial #: [id number]     Status: active Lead 3    Location: LV     DOI: 06/19/2008     Model #: [id number]     Serial #: [id number]     Status: active  Magnet Response Rate:  BOL 85 ERI  65  Indications:  CHF  Explantation Comments:  LV LEAD OFF Pacemaker dependent  PPM Follow Up Pacer Dependent:  Yes      Episodes Coumadin:  Yes  Parameters Mode:  DDD     Lower Rate Limit:  50     Upper Rate Limit:  130 Paced AV Delay:  310     Sensed AV Delay:  280 Tech Comments:  See PaceArt

## 2010-11-20 ENCOUNTER — Encounter (INDEPENDENT_AMBULATORY_CARE_PROVIDER_SITE_OTHER): Payer: Medicare Other

## 2010-11-20 DIAGNOSIS — I714 Abdominal aortic aneurysm, without rupture: Secondary | ICD-10-CM

## 2010-11-24 LAB — CBC
Hemoglobin: 10 g/dL — ABNORMAL LOW (ref 12.0–15.0)
Hemoglobin: 10.5 g/dL — ABNORMAL LOW (ref 12.0–15.0)
RBC: 3.17 MIL/uL — ABNORMAL LOW (ref 3.87–5.11)
RDW: 15.1 % (ref 11.5–15.5)
RDW: 15.2 % (ref 11.5–15.5)
WBC: 9.8 10*3/uL (ref 4.0–10.5)

## 2010-11-24 LAB — GLUCOSE, CAPILLARY
Glucose-Capillary: 130 mg/dL — ABNORMAL HIGH (ref 70–99)
Glucose-Capillary: 153 mg/dL — ABNORMAL HIGH (ref 70–99)

## 2010-11-24 LAB — DIFFERENTIAL
Basophils Absolute: 0 10*3/uL (ref 0.0–0.1)
Lymphocytes Relative: 8 % — ABNORMAL LOW (ref 12–46)
Lymphs Abs: 0.8 10*3/uL (ref 0.7–4.0)
Monocytes Absolute: 0.4 10*3/uL (ref 0.1–1.0)
Monocytes Relative: 4 % (ref 3–12)
Neutro Abs: 8.6 10*3/uL — ABNORMAL HIGH (ref 1.7–7.7)

## 2010-11-24 LAB — COMPREHENSIVE METABOLIC PANEL
ALT: 10 U/L (ref 0–35)
Albumin: 2.7 g/dL — ABNORMAL LOW (ref 3.5–5.2)
Alkaline Phosphatase: 89 U/L (ref 39–117)
Glucose, Bld: 135 mg/dL — ABNORMAL HIGH (ref 70–99)
Potassium: 3.5 mEq/L (ref 3.5–5.1)
Sodium: 138 mEq/L (ref 135–145)
Total Protein: 5.9 g/dL — ABNORMAL LOW (ref 6.0–8.3)

## 2010-11-24 LAB — LIPID PANEL
Cholesterol: 167 mg/dL (ref 0–200)
Total CHOL/HDL Ratio: 4.6 RATIO
VLDL: 52 mg/dL — ABNORMAL HIGH (ref 0–40)

## 2010-11-24 LAB — FOLATE: Folate: 8.2 ng/mL

## 2010-11-24 LAB — RETICULOCYTES
RBC.: 3.17 MIL/uL — ABNORMAL LOW (ref 3.87–5.11)
Retic Ct Pct: 2.6 % (ref 0.4–3.1)

## 2010-11-24 LAB — APTT: aPTT: 30 seconds (ref 24–37)

## 2010-11-24 LAB — POCT I-STAT, CHEM 8
BUN: 56 mg/dL — ABNORMAL HIGH (ref 6–23)
Potassium: 4.1 mEq/L (ref 3.5–5.1)
Sodium: 142 mEq/L (ref 135–145)
TCO2: 29 mmol/L (ref 0–100)

## 2010-11-24 LAB — TSH: TSH: 0.828 u[IU]/mL (ref 0.350–4.500)

## 2010-11-24 LAB — IRON AND TIBC: TIBC: 261 ug/dL (ref 250–470)

## 2010-11-24 LAB — HEMOGLOBIN A1C: Hgb A1c MFr Bld: 6.6 % — ABNORMAL HIGH (ref 4.6–6.1)

## 2010-11-26 NOTE — Procedures (Unsigned)
DUPLEX ULTRASOUND OF ABDOMINAL AORTA  INDICATION:  History of abdominal aortic aneurysm.  HISTORY: Diabetes:  No. Cardiac:  No. Hypertension:  No. Smoking:  Previous. Connective Tissue Disorder: Family History:  No. Previous Surgery:  No.  DUPLEX EXAM:         AP (cm)                   TRANSVERSE (cm) Proximal             2.8 cm                    2.6 cm Mid                  2.3 cm                    2.1 cm Distal               3.1 cm                    2.9 cm Right Iliac          1.5 cm                    1.2 cm Left Iliac           1.3 cm                    1.5 cm  PREVIOUS:  Date:  05/27/2009  AP:  2.7  TRANSVERSE:  2.7  IMPRESSION:  Mild aneurysmal dilatation of the distal abdominal aorta noted with no significant change in maximum diameter when compared to the previous exam performed at [facility].  ___________________________________________ Larina Earthly, M.D.  CH/MEDQ  D:  11/20/2010  T:  11/20/2010  Job:  [id number]

## 2011-01-05 NOTE — Assessment & Plan Note (Signed)
[name] [name]                         ELECTROPHYSIOLOGY OFFICE NOTE   NAME:[name] [name] WEESNER                     MRN:          [id number]  DATE:10/25/2008                            DOB:          [birth year]     [name] [name] returns today for followup.  She is a very pleasant middle-  aged woman with a history of congestive heart failure and severe COPD,  left bundle-branch block, and intermittent complete heart block.  She  underwent BiV pacemaker insertion several months ago with early evidence  of  micro lead dislodgement resulting in diaphragmatic stimulation.  The  patient's LV lead has been turned off.  She returns today for followup.  I had seen her back several weeks ago and at that time, she continues to  complain of increasing shortness of breath and we could not program her  pacemaker LV lead on without diaphragmatic stimulation.  She remains  with congestive heart failure symptoms, though she thinks that these are  some somewhat improved.  It should be noted that when we saw her last,  she was conduction one-to-one, and we increased her AV delay to allow  for some intrinsic conduction.  She returns today for followup.  She  states that when she is inside, she feels short of breath, when she is  cooking, she feels short of breath, when she is outside walking and when  she goes to the store, she has no trouble with her activities.   CURRENT MEDICATIONS:  1. Potassium 10 mEq daily.  2. Potassium 12.5 daily.  3. Nexium 40 mg a day.  4. Singulair daily.  5. Spiriva daily.  6. Aspirin 81 mg daily.  7. Paxil 20 mg half tablet daily.  8. Benicar 20/12.5 daily.  9. Pulmicort inhaler.  10.Simvastatin 80 mg half tablet Monday, Wednesday, and Friday.  11.Furosemide 20 mg a day.   PHYSICAL EXAMINATION:  GENERAL:  She is a pleasant, middle-aged woman in  no acute distress.  VITAL SIGNS:  Blood pressure was 113/72, the pulse was 84 and regular,  the  respirations were 18, the weight was 186 pounds, which is down 2  pounds from her visit several weeks ago.  NECK:  No jugular venous distention.  LUNGS:  Clear to bilateral auscultation.  No wheezes, rales, or rhonchi  are present.  HEART:  Breath sounds were decreased throughout, but there were no  wheezes or rhonchi.  CARDIOVASCULAR:  Distant with a regular rate and rhythm and normal S1  and S2.  ABDOMEN:  Soft, nontender.  EXTREMITIES:  No edema.   IMPRESSION:  1. Symptomatic bradycardia.  2. Status post biventricular pacemaker insertion.  3. Congestive heart failure with mild left ventricular dysfunction.  4. Chronic obstructive pulmonary disease.   DISCUSSION:  I have recommended the patient stop HCTZ and increase her  Lasix to 40 mg a day in divided doses.  I will see her back in 3 months.  I have instructed her to maintain a low-salt diet.     Doylene Canning. Ladona Ridgel, MD  Electronically Signed    GWT/MedQ  DD: 10/25/2008  DT:  10/26/2008  Job #: [id number]

## 2011-01-05 NOTE — Assessment & Plan Note (Signed)
[facility]                         ELECTROPHYSIOLOGY OFFICE NOTE   NAME:Pohlman, CIARA KAGAN                     MRN:          [id number]  DATE:10/15/2008                            DOB:          Jun 22, 1932    Ms. Devora returns today for followup.  She is a very pleasant middle-  aged woman with history of both congestive heart failure and severe COPD  as well as left bundle-branch intermittent complete heart block who  underwent BiV pacemaker insertion several months ago.  The patient was  subsequently found to have diaphragmatic stimulation secondary to  microscopic dislodgement and we ultimately turned off her LV pacing  lead.  Fortunately, the patient's heart block is improved today.  She is  no longer requiring continuous pacing.  She denies chest pain.  Her  dyspnea is baseline.   Her medications include hydrochlorothiazide 12.5 a day, potassium 10 mEq  daily, Nexium 40 a day, Singulair 10 mg daily, Spiriva daily, aspirin 81  daily, Paxil 20 mg half tablet daily, Benicar/hydrochlorothiazide  20/12.5 daily, Nexium 40 a day, simvastatin 40 on Monday, Wednesday, and  Friday, furosemide 20 a day, and Pulmicort 4 times a day.   On physical exam, she is a pleasant middle-aged woman in no acute  distress.  The blood pressure was 118/78, the pulse 73 and regular,  respirations were 18, weight was 188 pounds.  Neck revealed no jugular  venous distention.  Lungs clear bilaterally to auscultation.  No  wheezes, rales, or rhonchi are present.  There is no increased work of  breathing.  Cardiovascular exam revealed a regular rate and rhythm.  Normal S1 and S2.  There are no murmurs, rubs, or gallops.  Abdominal  exam was soft, nontender.  Extremities demonstrated trace peripheral  edema.   Interrogation of her pacemaker today demonstrates a Medtronic BiV  pacemaker.  The P-waves were greater than 2 and the R-waves were greater  than 11, the impedance 477 in  the atrium and 574 in the ventricle, the  threshold was 1 at 0.4 in the A and 1 at 0.4 in the RV.  Diaphragmatic  stimulation was demonstrated pacing at 1 volt at 0.1 milliseconds and  her pacing threshold was approximately 1 volt at 0.4 milliseconds.  Battery voltage was 3 volts.  She was 99% V paced.  Today, we  reprogrammed her device to decrease her outputs in the A and the RV and  we increased her AV delay to allow for intrinsic conduction to persist,  hopefully it will.  We will plan to see the patient back in several  weeks to see how she is doing and to reassess whether a LV lead revision  would be required.   IMPRESSION:  1. Congestive heart failure.  2. Intermittent complete heart block, currently without complete heart      block.  3. Chronic obstructive pulmonary disease.  4. Status post biventricular pacemaker insertion now with left      ventricular dysfunction and diaphragmatic stimulation.   We will plan on a period of watchful waiting.  Hopefully the  reprogrammed in her device  will help with her symptoms and she should  have no more diaphragmatic stimulation.  I will see the patient back in  several weeks.     Doylene Canning. Ladona Ridgel, MD  Electronically Signed    GWT/MedQ  DD: 10/15/2008  DT: 10/16/2008  Job #: [id number]

## 2011-01-05 NOTE — Discharge Summary (Signed)
NAMEKIEARRA, OYERVIDES              ACCOUNT NO.:  [id number]   MEDICAL RECORD NO.:  [id number]          PATIENT TYPE:  INP   LOCATION:  3741                         FACILITY:  [facility]   PHYSICIAN:  Doylene Canning. Ladona Ridgel, MD    DATE OF BIRTH:  Jun 23, 1932   DATE OF ADMISSION:  06/18/2008  DATE OF DISCHARGE:  06/20/2008                               DISCHARGE SUMMARY   PULMONOLOGIST:  Charlaine Dalton. Sherene Sires, MD, [facility]   MEDICAL PRIMARY CAREGIVER:  Lucky Cowboy, MD   ALLERGIES:  PENICILLIN, KEFLEX, LEVAQUIN, CIPRO, and BIAXIN.   FINAL DIAGNOSES:  1. Discharging day #1 status post implant of a Medtronic InSync III      biventricular pacemaker (June 19, 2008).  2. Discharging day #1 status post left heart catheterization      demonstrating nonobstructive coronary artery disease, ejection      fraction 40%.  3. Admitted with progressive dyspnea, fatigue, and exercise      intolerance secondary to high-grade heart block/complete heart      block.   SECONDARY DIAGNOSES:  1. Chronic obstructive pulmonary disease/emphysema.  2. Chest x-ray consistent with pulmonary hypertension.  3. Dyslipidemia.  4. Hypertension.  5. Diabetes, diet controlled.  6. Gastroesophageal reflux disease.   PROCEDURES:  1. June 19, 2008, left heart catheterization.  There is a 30%      tandem stenosis in the mid right coronary artery.  The left      anterior descending and left circumflex have luminal      irregularities.  Ejection fraction 40% by Dr. Charlies Constable.  2. June 19, 2008, implant of Medtronic InSync III BiV  pacemaker by      Dr. Lewayne Bunting.  The patient tolerated the procedure well.  She      has no hematoma in the postprocedure.  The leads are in appropriate      position by x-ray.  Interrogation of the device shows all      electrical parameters within normal limits.   BRIEF HISTORY:  Ms. Whitmoyer is a 77 year old female.  As of June 18, 2008, she had no prior cardiac history, what she does  have is home  oxygen-dependent COPD/emphysema/asthmatic bronchitis.  She also has  possible pulmonary hypertension.   Up to 3 weeks ago, she has been very active.  She  trimmed her hedges,,  mowed her lawn.  She says she did get a flu shot on May 15, 2008,  and 5 days later she had rather abrupt symptoms of dyspnea on exertion  with dyspnea even at rest.  She was fatigued.  She had to increase the  use of her home oxygen.  The patient seemed to think that this was  secondary to her flu shot.   What we do know is that her electrocardiogram shows 2:1 heart block  Mobitz II type 2 and intermittent complete heart block.  Echocardiogram  on June 18, 2008, showed ejection fraction 40% with mild mitral  regurgitation, mild tricuspid regurgitation.  Dr. Eden Emms admitted her to  [facility] on June 18, 2008, for left heart catheterization,  to be followed by a possible pacemaker depending on Electrophysiology  consult.   HOSPITAL COURSE:  The patient presents from the office June 18, 2008,  admitted by Dr. Eden Emms.  She had rather abrupt onset of dyspnea, an  exercise intolerance.  Finding on electrocardiogram shows a complete  heart block; second-degree, high-grade heart block.  The patient was  prepared for left heart catheterization.  This was done on June 19, 2008, showed nonobstructive coronary artery disease, ejection fraction  of 40%.  She was seen in consultation by both doctors, by Dr. Lewayne Bunting, who recommended implant of a biventricular pacemaker for  indications of high-grade complete heart block, class IIIB congestive  heart failure, ejection fraction 40%.  On June 19, 2008, once again  the left heart catheterization showed nonobstructive coronary artery  disease, ejection fraction is 40%.  She then underwent BiV pacemaker  implant of Medtronic InSync III pacemaker by Dr. Lewayne Bunting.  The  patient is BiV pacing currently, A-sensing and BiV pacing.   She still  has some shortness of breath postprocedure day #1 and wheezes  throughout.  She will have to start nebulizer therapy with IV Lasix in  order to clear perhaps some element of advancing congestive heart  failure prior to discharge.  She will probably discharge on June 20, 2008.  Interrogation of the device show it is working well.  X-ray shows  no pneumothorax, but pulmonary vascular congestion.  Incision care and  mobility of the left arm will be discussed with the patient and she will  go home on the following medications:  1. Z-Pak for 5-day course.  2. Benicar 20/25 a new dose with advancement of her      hydrochlorothiazide for better control of her heart failure.  3. Enteric-coated aspirin 81 mg daily.  4. Paxil 10 mg daily.  5. Nexium 40 mg daily.  6. Pulmicort 2 puffs twice daily.  7. Spiriva 18 mcg per inhalation 1 puff daily.  8. Singulair 10 mg daily.  9. DuoNeb nebulizer therapy up to 4 times daily.  10.Simvastatin 40 mg daily.  11.Vitamin D 2000 mg 4 times daily.  12.Potassium chloride 10 mEq daily to help supplement new dose of      diuretic.   Once again Z-Pak at discharge.  She follows up at [facility] on  July 18, 2008, [place], [place].  She will see Dr.  Eden Emms on Thursday, July 04, 2008, at 9 o'clock.  She will see the  [facility] on Thursday, July 04, 2008, at 9:40.  She has an  appointment scheduled for Dr. Ladona Ridgel on February 2010.  His office will  call with that appointment.   LABORATORY STUDIES THIS ADMISSION:  Complete blood count on June 18, 2008, white cells 5.8, hemoglobin 12.7, hematocrit 37.3, and platelets  of 110.  Sodium 139, potassium 3.6, chloride 106, carbonate 28, BUN is  15, creatinine 1.42, and glucose 117.  TSH this admission 3.608.  HGB  A1c is 6.5.        Maple Mirza, PA      Doylene Canning. Ladona Ridgel, MD  Electronically Signed    GM/MEDQ  D:  06/20/2008  T:  06/20/2008  Job:   [id number]   cc:   Noralyn Pick. Eden Emms, MD, [name]  Charlaine Dalton. Sherene Sires, MD, [facility]  Lucky Cowboy, M.D.

## 2011-01-05 NOTE — Assessment & Plan Note (Signed)
[facility]                            CARDIOLOGY OFFICE NOTE   NAME:[name], [name]                     MRN:          [id number]  DATE:07/04/2008                            DOB:          Jul 29, 1932    [name] returns today for followup.  She was referred to me initially  by Dr. Sherene Sires for increasing shortness of breath.  She has end-stage COPD  on home O2.  She was found to be in high-grade heart block.  She  received a pacemaker by Dr. Ladona Ridgel.  She is improved.  Unfortunately,  her husband is in the hospital now with pneumonia.  She has gotten a flu  shot, but not the H1N1 shot.  I told her to follow up with pulmonary for  this.  She is back at her baseline in regards to her dyspnea.  She has  not had a fever or cough.  Her wound has healed well.   REVIEW OF SYSTEMS:  Otherwise negative.  Delsa Grana checked her  pacemaker today and it was doing fine.   Her meds currently include:  1. Klor-Con 10 a day.  2. Hydrochlorothiazide 12.5 a day.  3. Singulair 10 mg a day.  4. Spiriva.  5. Aspirin.  6. Paxil 10 mg a day.  7. Benicar 20/12.5.  8. Nexium 40 a day.  9. Pulmicort.  10.Paroxetine 10 mg a day.  11.Furosemide 20 a day.   PHYSICAL EXAMINATION:  GENERAL:  Remarkable for an overweight white  female in no distress.  Her pacer site is well healed with no hematoma.  VITAL SIGNS:  Weight is 189, blood pressure 134/70, pulse 86 and  regular.  HEENT:  Unremarkable.  Carotids normal without bruit.  No  lymphadenopathy, thyromegaly, or JVP elevation.  LUNGS:  Mild end-expiratory wheezes left greater than the right.  Good  diaphragmatic motion.  HEART:  S1 and S2.  Normal heart sounds.  PMI normal.  ABDOMEN:  Benign.  Bowel sounds positive.  No AAA.  No tenderness.  No  bruit.  No hepatosplenomegaly or hepatojugular reflux or tenderness.  EXTREMITIES:  Distal pulses are intact with trace edema.  NEUROLOGIC:  Nonfocal.  SKIN:  Warm and dry.  No  muscular weakness.   IMPRESSION:  1. High-grade heart block status post pacemaker functioning normally.      Follow up with Dr. Ladona Ridgel in 3 months.  2. Dyspnea, improved in regards to her cardiac component to it;      however, she still has end-stage chronic obstructive pulmonary      disease on home O2.  Follow up with Dr. Sherene Sires.  I encouraged to call      the Pulmonary Department to get H1N1 shot.  3. Lower extremity edema.  Continue low-dose diuretics.  It appears to      be at baseline.  4. Hyperlipidemia.  Continue statin drug.  Lipid and liver profile in      6 months.  5. Anxiety and depression.  Continue paroxetine 20 mg a day.  Mood      seems stable.   Overall, I  think [name] is doing well.  She will see Dr. Ladona Ridgel in 3  months and follow up with myself in 6.     Noralyn Pick. Eden Emms, MD, [facility]  Electronically Signed    PCN/MedQ  DD: 07/04/2008  DT: 07/04/2008  Job #: [id number]

## 2011-01-05 NOTE — Discharge Summary (Signed)
NAMEMARQUIS, [name]              ACCOUNT NO.:  [id number]   MEDICAL RECORD NO.:  [id number]          PATIENT TYPE:  INP   LOCATION:  2038                         FACILITY:  [facility]   PHYSICIAN:  Doylene Canning. Ladona Ridgel, MD    DATE OF BIRTH:  [birth year]   DATE OF ADMISSION:  08/01/2008  DATE OF DISCHARGE:  08/02/2008                               DISCHARGE SUMMARY   FINAL DIAGNOSES:  1. Biventricular pacemaker implanted on June 19, 2008, for 2:1      heart block with ventricular escape of 40 beats per minute.  2. Findings had interrogation of left ventricular lead dislodgement.  3. Discharged on day #1, status post revision of the left ventricular      lead of a Medtronic InSync III CRT-P system.  4. Diaphragmatic stimulation from left ventricular lead now turned      off.   SECONDARY DIAGNOSES:  1. History of dyspnea and fatigue in mid October 2009.  2. Echocardiogram on June 18, 2008; ejection fraction 40-50%, mild      mitral regurgitation, and mild tricuspid regurgitation.  3. Left heart catheterization on June 19, 2008, nonobstructive      coronary artery disease, a 30% right coronary artery stenosis, and      luminal irregularities in the left circumflex and the left anterior      descending.  4. Hypertension.  5. Dyslipidemia.  6. New York Heart Association Class III, chronic systolic congestive      heart failure.  7. Remote tobacco habituation.  8. End-stage chronic obstructive pulmonary disease, on home oxygen.  9. Abdominal aortic aneurysm with serial monitoring.  10.Chronic dyspnea/chronic lower extremity edema.  11.Anxiety/depression.   PROCEDURE:  On August 01, 2008, revision of the left ventricular  cardiac resynchronization lead, however, after implantation and removal  of the existing left ventricular lead, the patient did have  diaphragmatic stimulation and left ventricular lead was disabled.  The  patient discharged in post procedure day #1, she is not  short of breath.  She is very sore at the incision, but she will only take Tylenol for  this.   BRIEF HISTORY:  [name] is a 77 year old female as mentioned above she  had sudden onset of dyspnea and fatigue in mid October 2009.  She has  end-stage COPD.  She had cardiac workup late October 2009 including  echocardiogram and catheterization.  The echocardiogram showed ejection  fraction of 40-50%.  The catheterization showed nonobstructive coronary  artery disease.  She was seen by Electrophysiology in consult.  She was  found to have 2:1 heart block with slow ventricular escape rhythm and a  BiV pacer was implanted a Medtronic InSync III device.  The patient had  done well for a while, but then lapsed into increasing shortness of  breath, interrogation of the device showed that her cardiac  resynchronization lead in the left ventricular had become dislodged, and  she has been set up for a repeat study in electrophysiology lab and left  ventricular lead revision.   HOSPITAL COURSE:  The patient presents electively on August 01, 2008.  She had removal of the existing left ventricular lead with implantation  of a new LV lead.  It is well lateral in the left ventricle, however,  she has had diaphragmatic stimulation with this and is now disabled.  She will be rechecked in a 2-week visit.  The patient discharged on  August 02, 2008, with the admonition to keep her incision dry for the  next 7 days and sponge bathe until Thursday August 08, 2008.  She is  also to keep her arm quite for the next 4 days and to raise it on an  incremental basis as per directions.   MEDICATIONS AT DISCHARGE:  1. Potassium chloride 10 mEq daily.  2. Hydrochlorothiazide 12.5 mg daily.  3. Singulair 10 mg daily.  4. Spiriva daily.  5. Enteric-coated aspirin 81 mg daily.  6. Paxil 20 mg tablets one-half tab daily.  7. Benicar 20/12.5 one tab daily.  8. Pulmicort inhalation 4 times daily.  9.  Simvastatin 40 mg Monday, Wednesday, and Friday.  10.Furosemide 20 mg daily.  11.Home oxygen.  12.New medication clindamycin 300 mg one tab three times daily for 5      days antibiotic.   FOLLOWUP:  She follows up at Dr. Lubertha Basque office[facility],  Wednesday August 14, 2008, at 9:40.  She will see Dr. Ladona Ridgel on  Tuesday, October 15, 2008, at 11:00.   LABORATORY STUDIES:  Pertinent to this admission were drawn on July 24, 2008.  White cells are 6, hemoglobin 13, hematocrit 38, and platelets  are 137.  Protime 11 and INR 1.0.  Sodium 143, potassium 4.4, chloride  107, carbonate is 31, glucose 92, BUN is 15, and creatinine 1.5.      Maple Mirza, PA      Doylene Canning. Ladona Ridgel, MD  Electronically Signed    GM/MEDQ  D:  08/02/2008  T:  08/02/2008  Job:  [id number]   cc:   Doylene Canning. Ladona Ridgel, MD

## 2011-01-05 NOTE — Assessment & Plan Note (Signed)
[facility]                         ELECTROPHYSIOLOGY OFFICE NOTE   NAME:Bracy, EVANEE LUBRANO                     MRN:          [id number]  DATE:07/24/2008                            DOB:          1931/09/24    Ms. Hollenback is seen today as an add-on after she presented for device  followup and was found to have a left ventricular lead.  Radiography  demonstrated dislodgement of the lead into the right atrium.   The patient had undergone device implantation at the end of October,  receiving a CRT-P under Dr. Lubertha Basque care.  She had undergone  catheterization prior to that, that demonstrated nonobstructive coronary  artery disease.  The postoperative chest x-ray demonstrated high lateral  placement of the LV lead.  Today, her chest x-ray is noted, shows the  down below.   She has significant history of COPD and is described as having end-  stage on home oxygen.  She has chronic dyspnea and lower extremity  edema.  Her past medical history in addition is notable for anxiety and  depression.   SOCIAL HISTORY:  She is married.  She used to work in a Artist.  She does not use cigarettes.   CURRENT MEDICATIONS:  1. Potassium 10.  2. Hydrochlorothiazide 12.5.  3. Singulair 10.  4. Spiriva.  5. Aspirin 81.  6. Paxil 20.  7. Benicar 20/12.5.  8. Pulmicort 4 times a day.  9. Simvastatin 40 on Monday, Wednesday, and Friday.  10.Paroxetine 10.  11.Furosemide 20.  12.Potassium 10.    She is allergic to PENICILLIN, KEFLEX, BIAXIN, LEVAQUIN and CIPRO.   PHYSICAL EXAMINATION:  VITAL SIGNS:  Today her blood pressure was  128/65, her pulse was 74.  GENERAL:  She was in no acute distress.  LUNGS:  Clear.  NECK:  Veins were flat.  HEART:  Sounds were regular without murmurs or gallops.  Wound was well-  healed.  EXTREMITIES:  No edema.  NEUROLOGICAL:  Grossly normal.   Chest x-ray demonstrated dislodgement of the LV lead into the right  atrium as  well as retraction of the LA and the LV lead.   IMPRESSION:  1. 2:1 heart block.  2. Status post cardiac resynchronization therapy pacemaker for the      above.  3. Mild depression of left ventricular systolic function with notes in      the chart of 45-50%.  4. Intercurrent left ventricular lead dislodgement.  5. Chronic obstructive pulmonary disease-oxygen-dependent  6. Anxiety/depression.  7. PENICILLIN allergy.   Ms. Span has dislodgement of her left ventricular lead and is  functionally moderately impaired at baseline.  We discussed the  potential benefits as well as potential risks of:  1. Doing nothing.  2. Removing the LV lead.      a.     Repositioning the LV lead.   My recommendation was that we pursue the latter, given her modest  depression of LV systolic function.  Increasing data demonstrating  improvement in LV remodeling with resynchronization.  She is agreeable  to proceeding.  We have also discussed about the potential risk  of  infection.   I would anticipate that it might be able to maintain venous access for  using a long wire through the displaced LV lead, but I will defer this  to Dr. Ladona Ridgel, who will be doing this procedure next week.     Duke Salvia, MD, [facility]  Electronically Signed    SCK/MedQ  DD: 07/24/2008  DT: 07/25/2008  Job #: [id number]

## 2011-01-05 NOTE — Assessment & Plan Note (Signed)
[facility]                         GASTROENTEROLOGY OFFICE NOTE   NAME:[name], [name]                     MRN:          [id number]  DATE:05/08/2007                            DOB:          07-Apr-1932    The patient presented for colonoscopy.  She was not charged for this  visit as she really just needed to see the nurse.  She needs a followup  5-year colonoscopy because of a family history of colon cancer in her  sister.   PROBLEMS:  1. Apparent remote history of colon polyps.  Last colonoscopy 2003 by      Dr. Corinda Gubler showing diverticulosis, internal hemorrhoids, and an      angiodysplasia in the cecum with no bleeding.  2. Gastroesophageal reflux disease.  3. Asthma.  4. Abdominal aortic aneurysm 2.8-cm maximal size.  It is not changing.      She is undergoing followup with Dr. Oneta Rack.  5. Hypertension.  6. Diet-controlled diabetes mellitus.  7. Dyslipidemia.  8. Anxiety/depression.  9. Prior tubal ligation.  10.Prior hysterectomy, status post bladder suspension.  11.Prior appendectomy.  12.Overweight to obese.   PHYSICAL EXAMINATION:  VITAL SIGNS:  Today, weight 173 pounds, blood  pressure 98/66, height 5 feet 3 inches, pulse 68.  NECK:  Supple.  CHEST:  Clear.  HEART:  S1 S2.  No murmurs or gallops.   I appreciate the opportunity to care for this patient.     Iva Boop, MD,FACG  Electronically Signed    CEG/MedQ  DD: 05/08/2007  DT: 05/08/2007  Job #: [id number]   cc:   Lucky Cowboy, M.D.

## 2011-01-05 NOTE — Procedures (Signed)
DUPLEX DEEP VENOUS EXAM - LOWER EXTREMITY   INDICATION:  Left lower extremity pain and swelling.   HISTORY:  Edema:  Left lower extremity.  Trauma/Surgery:  No.  Pain:  Left lower extremity.  PE:  No.  Previous DVT:  On 08/01/09, left lower extremity DVT.  Anticoagulants:  Coumadin.  Other:   DUPLEX EXAM:                CFV   SFV   PopV  PTV    GSV                R  L  R  L  R  L  R   L  R  L  Thrombosis    o  +     +     +      o     o  Spontaneous   +  o     o     o      +     +  Phasic        +  o     o     o      +     +  Augmentation  +  o     o     o      +     +  Compressible  +  o     o     o      +     +  Competent     +  o     o     o      +     +   Legend:  + - yes  o - no  p - partial  D - decreased   IMPRESSION:  1. Evidence of subacute deep venous thrombosis in the left distal      external iliac vein, common femoral vein, profunda vein,      superficial femoral vein, and popliteal vein as well as superficial      venous thrombus in the short saphenous vein.  2. No evidence of deep venous thrombosis in the right common femoral      vein or left calf veins; however, some areas of limited      visualization in the calf due to edema, so cannot completely rule      out calf deep venous thrombosis.  3. Most areas of the deep venous thrombosis appear to be of complete      occlusion with trickle flow from the great saphenous vein in the      proximal common femoral vein and distal external iliac vein.   NOTE:  Patient seeing Dr. Darrick Penna today.       _____________________________  [name]. Fields, MD   AS/MEDQ  D:  09/03/2009  T:  09/03/2009  Job:  [id number]

## 2011-01-05 NOTE — Assessment & Plan Note (Signed)
[facility]                            CARDIOLOGY OFFICE NOTE   NAME:[name], [name]                     MRN:          [id number]  DATE:06/14/2008                            DOB:          12-30-1931    HISTORY OF PRESENT ILLNESS:  [name] is a 77 year old [name] referred  by Rubye Oaks in my court for increasing shortness of breath.   [name] has been seen multiple times recently in [name].  She is a longtime smoker who had previously quit in 1980s.  She  has significant emphysema and COPD.  She describes getting increasing  short of breath at [name] end of September after having a flu shot.  Prior  to [name] flu shot, she did yard work, pushed [employer].  She  subsequently was seen in pulmonary consultation on May 30, 2008 and  June 12, 2008.  She does not feel like she is getting better.  Her  chest x-ray showed chronic COPD changes with [name] suspicion for pulmonary  hypertension given dilated pulmonary arteries.  Her FEV1 by simple  spirometry was 41%.  She apparently wears 2 L of oxygen at night and had  sats in [name] mid 80s in our office visit.   [name] was supposed to have a chest CT that apparently was  cancelled due creatinine of 1.4.  She is a nondiabetic.  She was in our  office today.  I did not see a reason to cancel [name] CT scan.  I  therefore ordered it after our office visit.  I reviewed her CT scan, it  was technically adequate study.  There was no evidence of PE.  She had  central lobular emphysema with bilateral basilar scarring, enlargement  of [name] pulmonary arteries again suggesting pulmonary hypertension.   On talking to [name], she has not had PND or orthopnea.  She has  not had lower extremity edema.  However, it seems fairly clear that she  is developing pulmonary hypertension.   She denies any syncope, chest pain, or palpitations.   PAST MEDICAL HISTORY:  Remarkable for COPD,  dyslipidemia, anxiety, and  diabetes, although she is not on any medicine for this, hypertension,  abdominal aortic aneurysm, GERD, angiodysplasia of [name] cecum,  hemorrhoids, diverticulosis, and colonic polyps.   FAMILY HISTORY:  Remarkable for premature coronary artery disease on [name]  father's side, emphysema in a maternal uncle.   SOCIAL HISTORY:  [name] has a previous 30 pack year history of  smoking.  No EtOH.  She is married with four children.  She is a retired  Associate Professor.   CURRENT MEDICATIONS:  Aspirin, Singulair 10 a day, Nexium 40 a day,  simvastatin 80 a day, Paxil 20 a day, Benicar and hydrochlorothiazide  20/12.5, vitamin D, Mucinex, tramadol, prednisone, DuoNebs, and  Pulmicort.   ALLERGIES:  She is allergic to PENICILLIN which causes vomiting, KEFLEX  which causes vomiting, BIAXIN which causes nausea, LEVAQUIN which she  cannot tolerate, and CIPRO which causes nausea.   PHYSICAL EXAMINATION:  GENERAL:  Remarkable for an  overweight white  female who is slightly pale, respiratory rate is 20.  She does appear a  bit dyspneic at rest.  VITAL SIGNS:  Her blood pressure is equal to 150/80, pulse 52 and  regular, and weight is 188.  HEENT:  Unremarkable.  NECK:  JVP is elevated with a V-wave.  No carotid bruits.  No  lymphadenopathy or thyromegaly.  LUNGS:  Decreased breath sounds at [name] bases.  No active wheezing.  Poor  air movement.  HEART:  S1 and S2.  No obvious murmurs.  No obvious RV heave.  ABDOMEN:  Benign.  No abdominal ascites.  No fluid shift.  No  hepatosplenomegaly.  No hepatojugular reflex.  Distal pulses are intact.  No edema.  NEUROLOGIC:  Nonfocal.  SKIN:  Warm and dry.  No muscular weakness.   IMPRESSION:  1. Increasing shortness of breath in a [name] with chronic      obstructive pulmonary disease, central lobular emphysema, resting      desaturations on home O2.  I would say that at least part of her      problem is progressive chronic  obstructive pulmonary disease.  2. Probable pulmonary hypertension.  She has dilated pulmonary      arteries and worsening shortness of breath.  There is no evidence      of pulmonary embolism by CT.  We will do a 2-D echocardiogram to      assess for cor pulmonale and try to estimate her PA pressures by TR      velocity.  She does have some cardiomegaly and I will also check      her LV function.  I think it is most important to make sure that      there is not a fluid overload component to this since she had small      pleural effusions.  She would benefit from [name] addition of Lasix 20      mg a day in addition to her hydrochlorothiazide.  We will start      this and 10 mEq of potassium.  She will have a followup BMET and      BMP when I see her next week.  We tried to streamline her care.  We      had her initial consultation and CT scan done today.  She will have      her follow up blood work to make sure her creatinine is fine and      check her BMP at [name] same time she has her echo and sees me in      followup.  There is a high likelihood that she will need a right      heart catheterization to estimate her PA pressures at some point in      [name] near future.  3. Hypercholesterolemia with a coronary calcification on CT.  Continue      simvastatin.  4. Anxiety and depression.  Continue Paxil.  5. Hypertension, currently well controlled.  Continue current dose of      Benicar.   Further recommendation will be based on [name] results of her repeat blood  work and echo.   Addendum:  ECG shows periods of Wenkebach and 2:1 AV block.  Will  monitor rhythm and R/O higher grade heart block or bradycardia as this  may be contributing to her SOB.  Pulse today was irregular due to AV  node disease but not excessively bradycardic.  Will determine F/U  need  for pacer after echo.     Noralyn Pick. Eden Emms, MD, [facility]  Electronically Signed   PCN/MedQ  DD: 06/14/2008  DT: 06/15/2008  Job #: [id number]

## 2011-01-05 NOTE — Consult Note (Signed)
[name], [name]              ACCOUNT NO.:  [id number]   MEDICAL RECORD NO.:  [id number]          PATIENT TYPE:  INP   LOCATION:  3741                         FACILITY:  [facility]   PHYSICIAN:  Doylene Canning. Ladona Ridgel, MD    DATE OF BIRTH:  08/29/1931   DATE OF CONSULTATION:  06/19/2008  DATE OF DISCHARGE:                                 CONSULTATION   The consultation is requested by Dr. Charlton Haws.   INDICATION FOR CONSULTATION:  Evaluation of increasing shortness of  breath in the setting of high-grade heart block and left ventricular  dysfunction.   HISTORY OF PRESENT ILLNESS:  The patient is a 77 year old.  She has  known COPD, emphysema, and bronchitis.  She notes that approximately 3  weeks ago, she was outside and began to feel increasing shortness of  breath, which came on fairly suddenly and remained persistent for the  last several weeks.  She thought that maybe her flu vaccine had resulted  in these symptoms.  The patient presented to our office yesterday where  she was found to be in 2:1 heart block with a wide QRS and a 2D echo was  carried out demonstrating an EF of 40%.  She was found to have global  hypokinesis, mild MR and TR.  She underwent catheterization earlier  today where she was found to have no obstructive coronary artery  disease.  She had wedge pressure of 12.  Her PA pressures were in the  60s.  Her cardiac index was 1.9 x 6.  Her left ventriculogram  demonstrated an EF of 40%.  She is now referred for consideration for  BiV pacemaker insertion secondary to all of the above in the setting of  class III heart failure.  The patient has had no frank syncope.   PAST MEDICAL HISTORY:  As noted in the HPI.   SOCIAL HISTORY:  The patient is married and lives in [place].  She is a  retired Scientist, product/process development.  She has a history of tobacco use, but stopped  smoking in the 70s.  She has a 30 pack-year history.  She denies alcohol  abuse.   REVIEW OF SYSTEMS:  As noted  in the HPI.  She also has some problems  with anxiety and depression.  Otherwise, all systems were negative  except as noted above.   PHYSICAL EXAMINATION:  GENERAL:  She is a pleasant 77 year old woman in  no acute distress.  Blood pressure was 155/80, the pulse was 38 and  regular, respirations were 20, and temperature is 98.  HEENT:  Normocephalic and atraumatic.  Pupils equal and round.  Oropharynx is moist.  Sclerae anicteric.  NECK:  Intermittent cannon A-waves.  The JVD was approximately 8 cm.  Carotids are 2+ and symmetric.  Trachea was midline.  LUNGS:  Clear bilaterally except for rales in the bases bilaterally.  There are no wheezes or rhonchi noted.  There is no increased work of  breathing.  CARDIOVASCULAR:  Regular bradycardia with normal S1 and a split S2.  There is a soft systolic murmur at the left lower sternal  border.  Heart  sounds are somewhat distant.  ABDOMEN:  Obese, nontender, and nondistended.  There is no organomegaly.  Bowel sounds are present.  No rebound or guarding was noted.  EXTREMITIES:  No cyanosis or clubbing.  There was 1+ peripheral edema  bilaterally.  NEUROLOGIC:  Alert and oriented x3.  Cranial nerves intact.  Strength is  5/5 and symmetric.  The patient did appear somewhat anxious.  SKIN:  Normal.   The EKG demonstrates sinus rhythm with 2:1 heart block and a QRS  duration of 126 milliseconds.   IMPRESSION:  1. High-grade heart block with intermittent complete heart block as      well as 2:1 heart block and a wide QRS escape  2. Shortness of breath secondary to heart block in conjunction with      chronic obstructive pulmonary disease left ventricular dysfunction.  3. Left ventricular dysfunction with no obvious obstructive coronary      artery disease or prior myocardial infarction.   DISCUSSION:  The patient clearly requires pacemaker implantation.  With  her LV dysfunction and wide QRS, my recommendation would be BiV  pacemaker  insertion as she does not qualify or not currently having  indication for BiV ICD secondary to her only moderate LV dysfunction.  I  have discussed the risks, benefits, goals, and expectation of the  procedure and she is willing to proceed.  This will be scheduled later  today.      Doylene Canning. Ladona Ridgel, MD  Electronically Signed     GWT/MEDQ  D:  06/19/2008  T:  06/20/2008  Job:  [id number]   cc:   Jeoffrey Massed, MD  Charlaine Dalton. Sherene Sires, MD, FCCP

## 2011-01-05 NOTE — Op Note (Signed)
[name], [name]              ACCOUNT NO.:  [id number]   MEDICAL RECORD NO.:  [id number]          PATIENT TYPE:  INP   LOCATION:  3741                         FACILITY:  [facility]   PHYSICIAN:  Doylene Canning. Ladona Ridgel, MD    DATE OF BIRTH:  1932-04-02   DATE OF PROCEDURE:  06/19/2008  DATE OF DISCHARGE:                               OPERATIVE REPORT   PROCEDURE PERFORMED:  Implantation of a biventricular pacemaker.   INDICATIONS:  Symptomatic 2:1 heart block with class III heart failure  symptoms in the setting of the LV dysfunction, EF 40%.   INTRODUCTION:  The patient is a 77 year old.  She was in her usual state  of health until approximately 3 weeks ago when she suddenly began to  feel weak and short of breath and tired.  She ultimately sought medical  attention yesterday and was found to be in 2:1 heart block with a  ventricular escape which was wide in the 40 beat per minute range.  A 2-  D echo was done urgently and she was found to have an EF of 40% and was  admitted to the hospital where she underwent catheterization earlier  today demonstrating no obstructive coronary artery disease, PA pressure  in the 60s and 2:1 heart block with an EF of 40%, and global  hypokinesis.  She is now referred for biventricular pacemaker insertion  secondary to all the above.   PROCEDURE:  After informed consent was obtained, the patient was taken  to the Diagnostic EP Lab in a fasting state.  After usual preparation  and draping, intravenous fentanyl and midazolam was given for sedation.  A 30 mL of lidocaine was infiltrated in the left infraclavicular region.  A 7-cm incision was carried out over this region.  Electrocautery was  utilized to dissect down the fascial plane.  The left subclavian vein  was then punctured x3 with a Medtronic, model 5076, 52-cm active  fixation pacing lead, serial number [id number], being advanced into the  right ventricle.  A Medtronic model [id number], 45-cm active  fixation pacing  lead, serial number [id number] being advanced to the right atrium.  Mapping was then carried out in the right ventricle at the final site.  The R-waves were measured 23 mV.  The pacing impedance was 737 ohms and  the threshold was 0.8 volts at 0.5 milliseconds.  The 10-volt pacing did  not stimulate the diaphragm.  With the ventricular lead in satisfactory  position, attention was then turned to placement of the atrial lead,  which was paced in the anterolateral portion of the right atrium.  P-  waves measured 2 mV and pacing impedance 500 ohms with threshold of 0.6  volts at 0.5 milliseconds.  Again, 10-volt pacing did not stimulate the  diaphragm.  With the atrial and ventricular leads in satisfactory  position, temperature was then turned to placement of the left  ventricular lead.  The coronary sinus guiding catheter was advanced  along with EP catheter into the right atrium.  The coronary sinus was  cannulated without difficulty.  The mapping catheter was advanced into  the coronary sinus and the guiding catheter was advanced over it.  A 10  mL of contrast was injected into the coronary sinus demonstrating a high  lateral vein a lateral vein and 2 smaller posterior veins.  The lateral  vein was selected for LV lead placement.  The vein was cannulated with a  0.014 floppy guidewire and a Medtronic, model 4196, 88-cm bipolar pacing  lead, serial number [id number] (attainability) was advanced over the  guidewire and into the lateral vein.  In this location, the pacing  thresholds were satisfactory.  The R waves were 7, the impedance was  1200, and threshold 0.6 volts at 0.5 milliseconds.  The 10-volt pacing  did not stimulate the diaphragm.  With these satisfactory parameters,  the lead was liberated from guiding catheter in the usual manner.  The  electrocautery was utilized to make subcutaneous pocket.  Kanamycin  irrigation was given.  The Medtronic InSync III  biventricular pacemaker  was connected to the atrial RV and LV leads and placed back in the  subcutaneous pocket and secured with silk suture.  Additional kanamycin  was then utilized to irrigate the pocket and incision was closed with 2-  0 Vicryl followed by 3-0 Vicryl.  Benzoin was painted on the skin.  Steri-Strips were applied.  Pressure dressing was placed and the patient  was ultimately returned to her room in satisfactory condition.   COMPLICATIONS:  There were no immediate procedure complications.   RESULTS:  This demonstrates successful implantation of a Medtronic dual-  chamber biventricular pacemaker in a patient with symptomatic 2:1 heart  block with an EF of 40%, wide QRS and class IIIB congestive heart  failure.      Doylene Canning. Ladona Ridgel, MD  Electronically Signed     GWT/MEDQ  D:  06/19/2008  T:  06/20/2008  Job:  [id number]   cc:   Lucky Cowboy, M.D.  Charlaine Dalton. Sherene Sires, MD, FCCP

## 2011-01-05 NOTE — Assessment & Plan Note (Signed)
[facility]                            CARDIOLOGY OFFICE NOTE   NAME:[name], [name] SCIFRES                     MRN:          [id number]  DATE:06/18/2008                            DOB:          Jan 18, 1932    [name] returns today for followup.   The patient was initially seen at the request of Rubye Oaks on  June 14, 2008.  She is a long-time smoker who had quit in the 80s but  has significant emphysema and COPD.  She has been having increasing  shortness of breath.   Her chest x-ray showed suspicion for pulmonary hypertension with dilated  pulmonary arteries.  Her FEV-1 is 41% of predicted.  She wears 2 L of  oxygen at night.   We did a chest CT on the patient which confirms dilated pulmonary  arteries.  There was no pulmonary embolus.  There was a suggestion of  thickened septa and possible cephalization with congestive failure.  In  talking to the patient, she seems to think her shortness of breath is  slightly better.  I reviewed her EKG from June 14, 2008, and  initially I had thought it was sinus rhythm with sinus bradycardia and  PACs.  However, today her pulse was low and irregular.  Again, we did a  longer rhythm strip and I also reviewed her tele while she was having  her echocardiogram.  She appears to have 2:1 heart block.  There are  conducted PACs and I do not think she is in complete heart block;  however, she has interventricular conduction delay as well.  Initial  review of her first 2 parasternal echo images shows that her EF is  probably in the 45-50% range.  It is difficult to tell due to her  irregular rhythm.   I told [name] that given the extent of her COPD, being oxygen-  dependent, the fact that she is not on any AV nodal blocking drugs, and  the fact that she has to 2: 1 heart block, I prefer to admit her to the  hospital.  Her CT scan did not show anything specific in regards to her  pulmonary status that may  have changed.  I suspect her acute worsening  and shortness of breath has to do.  The patient is not having syncope.  She was unaware of her rhythm.  She has not had any significant chest  pain.   I think, the best course of action would be to hospitalize her.  We will  do a right and left heart cath tomorrow.  We will rule out coronary  artery disease which is the etiology of her high-grade heart block given  her age and previous smoking.  We will also rule out severe pulmonary  hypertension which may need to be treated further with vasodilators.  I  have also tentatively scheduled her to have a pacemaker placed Thursday  morning.  We are going to call our EP doctors to see the patient on  hospital admission.   ALLERGIES:  The patient's allergies include PENICILLIN that causes  vomiting, KEFLEX  causes vomiting, BIAXIN causes nausea, LEVAQUIN cannot  tolerate, CIPRO nausea.   CURRENT MEDICATIONS:  1. Simvastatin 40 mg a day.  2. Singulair 10 a day.  3. Spiriva once a day.  4. Baby aspirin a day.  5. Paxil 10 a day.  6. Benicar 12.5 a day.  7. Nexium 40 a day.  8. Prozac 10 a day.  9. Vitamin D.  10.Pulmicort.   FAMILY HISTORY:  Noncontributory.   The patient lives at home.  She has family in the area.  She is retired.  She has 2 L of oxygen at home.  She does not drink.  The patient's  daughter is also a patient of mine and her husband sees one of our  doctors as well.   PHYSICAL EXAMINATION:  Remarkable for an elderly white female in no  distress.  Her pulse is irregular at 48-54.  Her blood pressure is  130/70, afebrile.  HEENT:  Unremarkable.  NECK:  Carotids are normal without bruit.  No lymphadenopathy, no  thyromegaly, no JVP elevation.  LUNGS:  Clear, good diaphragmatic motion.  No wheezing.  HEART:  S1 and S2, normal heart sounds, PMI normal.  ABDOMEN:  Benign.  Bowel sounds positive.  No AAA, no tenderness, no  bruit, no hepatosplenomegaly, no hepatojugular  reflux, no tenderness.  EXTREMITIES:  Distal pulses intact.  No edema.  NEUROLOGIC:  Nonfocal.  SKIN: Warm and dry.  MUSCULOSKELETAL:  No muscular weakness.   EKGs were reviewed and show high-grade 2:1 heart block with  interventricular conduction delay, no acute ST elevation, echo results  pending.   CT results reviewed with no PE, possible congestive failure.   The patient also was noted to have central lobular emphysema.   I do not have the patient's lab work in front of me, but I am fairly  sure that we checked her BMET last week.  We will try to pull these up  and she will have routine admitting labs prior to her heart cath  tomorrow.     Noralyn Pick. Eden Emms, MD, [facility]  Electronically Signed    PCN/MedQ  DD: 06/18/2008  DT: 06/19/2008  Job #: [id number]

## 2011-01-05 NOTE — Discharge Summary (Signed)
[name], [name]              ACCOUNT NO.:  [id number]   MEDICAL RECORD NO.:  [id number]          PATIENT TYPE:  INP   LOCATION:  3741                         FACILITY:  [facility]   PHYSICIAN:  Doylene Canning. Ladona Ridgel, MD    DATE OF BIRTH:  [birth year]   DATE OF ADMISSION:  06/18/2008  DATE OF DISCHARGE:  06/21/2008                               DISCHARGE SUMMARY   FINAL DIAGNOSES:  1. Implant of Medtronic InSync III biventricular pacer on June 19, 2008.  2. Left heart catheterization on June 19, 2008 with finding of      nonobstructive coronary artery disease, ejection fraction 40%.  3. Admitted with high-grade heart block/complete heart block, now in      complete heart block.   SECONDARY DIAGNOSES:  1. Chronic obstructive pulmonary disease, emphysema with home oxygen.  2. Dyslipidemia.  3. Hypertension.  4. Diabetes.  5. Gastroesophageal reflux disease.  6. Chest x-ray consistent with pulmonary hypertension.   This addendum notes that the patient was kept an extra 24 hours  discharging on June 21, 2008 for pulmonary toilet.  She responded  well to mild diuresis and responded well to nebulizer therapy.  Once  again, she is asked to keep her incision dry for the next 7 days, sponge  bathe until Wednesday June 26, 2008.   MEDICATIONS:  1. Benicar 20/25, a new dose daily.  2. Enteric-coated aspirin 81 mg daily.  3. Paxil 10 mg daily.  4. Nexium 40 mg daily.  5. Pulmicort 2 puffs twice daily.  6. Spiriva 18 mcg per inhalation 1 puff daily.  7. Singulair 10 mg daily.  8. DuoNeb up to 4 times daily as needed.  9. Simvastatin 40 mg daily.  10.Vitamin D 200 mg 4 times daily.  11.A new medication potassium chloride 10 mEq daily.  12.Home oxygen at night and by day as needed.   The patient will also go home with a Z-Pak to follow with the  instructions on the insert.  She has prescriptions for the new dose of  Benicar, hydrochlorothiazide, and for potassium.   FOLLOWUP:  Her followup is at [facility], [address] [facility], Thursday, July 04, 2008 at 9:40.  Dr. Eden Emms  on July 04, 2008 at 9 and she will see Dr. Ladona Ridgel in February 2010  as scheduled.      Maple Mirza, PA      Doylene Canning. Ladona Ridgel, MD  Electronically Signed    GM/MEDQ  D:  06/21/2008  T:  06/21/2008  Job:  [id number]

## 2011-01-05 NOTE — Assessment & Plan Note (Signed)
[name]                         GASTROENTEROLOGY OFFICE NOTE   NAME:[name] [name] YAEGER                     MRN:          [id number]  DATE:06/15/2007                            DOB:          1931-12-30    CHIEF COMPLAINT:  Wheezing, cough.   [name] presented for a colonoscopy.  She had a temperature of 99.8.  She has been wheezing, and she has been coughing up green phlegm for the  last day or two.  She did prep for her colonoscopy, but she feels weak  and somewhat nauseous.  We have decided to cancer her colonoscopy.   PHYSICAL EXAMINATION:  GENERAL:  Slightly lethargic but alert and  oriented x3.  VITAL SIGNS:  Temperature 99.8, blood pressure 174.  HEENT:  Pharynx is clear.  There is a slight tan coat on the tongue that  looked like some mucus.  LUNGS:  Bibasilar crackles that improve but do not clear with  respiration.  She has fair air movement with equal to slightly prolonged  expiratory phase with wheezing.  HEART:  S1, S2, somewhat distant.  No rubs, murmurs or gallops heard.  ABDOMEN:  Soft and nontender.   ALLERGIES/MEDICATIONS:  Listed and reviewed today.   ASSESSMENT:  This lady has chronic asthma/bronchitis.  It sounds like  she is having acute exacerbation.   PLAN:  She has a Z-Pak at home to use, and I have told her to go ahead  and use that and follow up with Dr. Oneta [name] within the next week.  We  will try to arrange a follow up appointment.  Certainly, she should call  back sooner if there are problems.  She has a bronchodilator nebulizer  at home, and she is to use that, as well.  She is to drink plenty of  fluids at this time and return to her normal diet.  Regarding her family  history of colon cancer and need for colonoscopy, she can reschedule at  her convenience, but she should wait until she is over this.  This is  certainly an elective procedure.     Iva Boop, MD,FACG  Electronically Signed    CEG/MedQ  DD: 06/15/2007  DT: 06/15/2007  Job #: [id number]   cc:   Lucky Cowboy, M.D.

## 2011-01-05 NOTE — Assessment & Plan Note (Signed)
OFFICE VISIT   [name], AUST S  DOB:  03-Jun-1932                                       09/03/2009  ZOXWR#:[id number]   CHIEF COMPLAINT:  Left leg swelling.   HISTORY OF PRESENT ILLNESS:  The patient is a 77 year old female  referred by Dr. Oneta Rack for evaluation of left lower extremity leg  swelling.  The patient developed an acute onset of left leg swelling  approximately 1 month ago.  She had a duplex ultrasound at that time  which showed a DVT.  She was started on Coumadin approximately 2 weeks  ago.  She stated that apparently she did not want to start the Coumadin  initially so this was delayed a few weeks.  She states that the swelling  has gotten slightly worse over the last few weeks.  Of note, she has  fairly severe COPD and is on home oxygen 2-3 liters intermittently.  However, she denies any recent onset of chest pain, any recent new cough  or any episodes of hemoptysis.  She does have some numbness and tingling  in her left foot which is increased with the swelling.  She states that  her last INR was 1 week ago and as far she knows she was in the  therapeutic range.  She has had no prior episodes of DVT.  She denies  any previous trauma to the left leg.  She has had a previous knee  arthroscopy several years ago in the left leg.  She has no history of  hypercoagulable state.   CHRONIC MEDICAL PROBLEMS:  Include coronary artery disease, COPD and  elevated cholesterol, all of which are currently controlled.   FAMILY HISTORY:  Unremarkable.   SOCIAL HISTORY:  She is married.  She is retired.  She is a former  smoker, quit in 1990.  She does not consume alcohol regularly.  Part of  her COPD is thought to be due to cotton exposure when she was working as  well.   REVIEW OF SYSTEMS:  A full 12 point review of systems was performed with  the patient today.  Please see intake referral form for details  regarding this.   PHYSICAL EXAM:  Vital  signs:  Blood pressure 119/76 in the left arm,  heart rate is 80 and regular.  Oxygen saturation is 86% on 3 liters  oxygen.  Temperature is 97.8.  HEENT:  Unremarkable.  Neck:  Has 2+  carotid pulses without bruit.  Chest:  Clear to auscultation with  distant breath sounds.  Cardiac:  Exam is regular rate and rhythm  without murmur.  Abdomen:  Is soft, obese, nontender, nondistended.  No  masses.  Extremities:  She has 2+ femoral pulses bilaterally.  She has  absent popliteal and pedal pulses bilaterally.  She has swelling of the  left lower extremity from the thigh down to the foot.  It is  approximately 20% larger than the right leg.  She has hemosiderin  staining in the left medial calf area.  She also has a bluish hue due to  discoloration in the left leg.  Skin:  Has no other ulcers or rashes.  Musculoskeletal:  System shows no obvious major joint deformities.  Neurologic:  Exam shows symmetric upper extremity and lower extremity  motor strength which is 5/5.  Psychiatric:  She is  alert and oriented  x3.  No significant history of anxiety or depression.   She had a lower extremity venous duplex exam today which shows DVT which  is subacute in nature throughout the left femoral, popliteal and tibial  veins in the left leg.  She had no DVT in the right leg.   In summary, the patient has a left lower extremity DVT.  She is  currently on Coumadin therapy for this.  She has not had any evidence of  pulmonary embolus to this point and does not really have any  contraindication to anticoagulation.  I believe the best course of  action for her would be continued management with Coumadin for at least  3 months if not 6 months' therapy.  If she developed a contraindication  to anticoagulation we would consider an IVC filter at that time.  I  discussed with her that the swelling will persist for some time and  hopefully the clot will lyse over time but she may always have some  residual  swelling in the left leg.  She had no evidence of phlegmasia in  the left leg.  She would not be a candidate for lysis of her clot due to  her age and overall pulmonary morbidity as well as underlying renal  dysfunction history.  She will follow with me on an as-needed basis.     [name]. Fields, MD  Electronically Signed   CEF/MEDQ  D:  09/03/2009  T:  09/04/2009  Job:  [id number]   cc:   Doylene Canning. Ladona Ridgel, MD  Lucky Cowboy, M.D.

## 2011-01-05 NOTE — Op Note (Signed)
NAMECATHERINE, [name]              ACCOUNT NO.:  [id number]   MEDICAL RECORD NO.:  [id number]          PATIENT TYPE:  INP   LOCATION:  2038                         FACILITY:  [facility]   PHYSICIAN:  Doylene Canning. Ladona Ridgel, MD    DATE OF BIRTH:  1931/10/08   DATE OF PROCEDURE:  08/01/2008  DATE OF DISCHARGE:                               OPERATIVE REPORT   PROCEDURE PERFORMED:  Left ventricular lead revision with pacemaker  pocket revision utilizing coronary sinus venography.   INTRODUCTION:  The patient is a 77 year old woman with a history of high-  grade complete heart block with a wide QRS escape.  She has a history of  LV dysfunction and congestive heart failure, and she underwent BiV  pacemaker insertion back in October.  At that time, a Medtronic device  was placed and the patient did well initially but then developed  worsening shortness of breath and back in the clinic was found to be  with her left ventricular lead having retracted back into the right  atrium.  In addition to that, the right atrial and RV leads appeared be  still attached with satisfactory thresholds but had lost their slack.  The patient is now referred for revision of the LV pacing lead and  adjustment of the atrial and RV pacing leads.   PROCEDURE:  After informed consent was obtained, the patient was taken  to the Diagnostic EP Lab in a fasting state.  After the usual  preparation and draping, intravenous fentanyl and midazolam was given  for sedation.  Of note, the patient's saturations remained around 90  percentile.  A 30 mL of lidocaine was infiltrated into the left  infraclavicular region over the old pacemaker insertion site.  A 5-cm  incision was carried out over this region and electrocautery was  utilized to dissect down to the fascial plane.  The pacemaker leads were  freed up from their fibrous adhesions with electrocautery.  It was then  seen that there was a fairly significant amount of fibrous  adhesions  already present despite the lead being in for less than 2 months.  Having accomplished this, the sewing sleeves were freed up from the  pocket and a stylet was advanced into the right ventricular as well as  right atrial lead, and with a combination of pressure and counter  pressure, the RV and right atrial leads were advanced so that there were  more slack in the lead.  Thresholds were carried out in the atrium and  the ventricle and were satisfactory.  Having done this, the left  ventricular lead which had retracted into the right atrium was freed up  from its fibrous adhesions and the sewing sleeve was also dissected  free.  The [employer]man 0.014 angioplasty guide wire was advanced into the LV  lead and down into the inferior vena cava.  The LV lead which was a  bipolar Medtronic lead was removed over the guide wire and placed in a  gentamicin bath.  At this point, the 9-French sheath was advanced over  the 0.014 guide wire and into the left  subclavian vein.  The Medtronic  guiding catheter was advanced along with a 6-French hexapolar EP  catheter into the right atrium.  The coronary sinus was again cannulated  without difficulty.  Venography of the coronary sinus was then carried  out.  This demonstrated a lateral vein, which had previously been used  for LV lead placement, which was somewhat short and a high lateral vein.  Initial attempts to advance the pacing lead into the posterior vein were  successful; however, the lead could not go out more than about halfway  from base to apex, and in this location, the pacing thresholds were  elevated typically 4.5 to 5 volts at 0.8 milliseconds.  At this point,  it was deemed not to be particularly good site as the spacing between  the RV and the LV leads was quite small.  At this point, the LV lead was  retracted from the posterior vein, and utilizing an [facility] guide wire  after abandoning the [facility] guide wire, the [employer] wire was  advanced out  into the high lateral vein.  This high lateral vein came off and  branched away from the anterior vein very nicely.  The Medtronic lead  which had previously been removed was re-inserted and ran over the guide  wire and through the guiding catheter into the high lateral vein of the  left ventricle.  In this location, the threshold was less than 3 volts  at 0.8 milliseconds and 10-volt pacing did not stimulate the diaphragm.  With these satisfactory parameters, the lead was freed up from its  guiding catheter in the usual manner without difficulty.  The lead was  secured to the subpectoralis fascia.  All leads were re-secured to the  subpectoralis fascia with a figure-of-eight silk suture, and the sewing  sleeves again secured with silk suture.  At this point, electrocautery  was utilized to free up some of the fibrous adhesions and revise the  pocket.  The pocket was then irrigated with kanamycin.  Electrocautery  was utilized to assure hemostasis, and the Medtronic InSync BiV  pacemaker was reattached to the LV, right atrial, and RV leads and  placed back into the subcutaneous pocket.  The pocket was irrigated with  kanamycin, and the incision then closed with 2-0 Vicryl and 3-0 Vicryl.  Benzoin was painted on the skin.  Steri-Strips were applied, and a  pressure dressing was placed and the patient was returned to her room in  satisfactory condition.   COMPLICATIONS:  There were no immediate procedure complications.   RESULTS:  This demonstrate successful left ventricular lead revision in  a patient with nonischemic cardiomyopathy and congestive heart failure  status post initial BiV pacemaker insertion, which had resulted in  dislodgement of the LV lead.      Doylene Canning. Ladona Ridgel, MD  Electronically Signed     GWT/MEDQ  D:  08/01/2008  T:  08/02/2008  Job:  [id number]

## 2011-01-08 NOTE — H&P (Signed)
[facility]  Patient:    [name], [name]                    MRN: [id number] Adm. Date:  09/14/00 Attending:  Marinus Maw, M.D.                         History and Physical  SUBJECTIVE:  Patient is a very nice 77 year old married white female with HTN, GERD, hyperlipidemia, and COPD who is being admitted with exacerbation of asthma and bronchitis.  CHIEF COMPLAINT:  Can not breathe good.  HISTORY OF PRESENT ILLNESS:  Patient who is now apparently more alert was brought to the emergency room by her daughters earlier this day having called her and found her to have slurred speech and apparently being confused.  In retrospect, patient now being more alert relates she has had upper and lower congestion for approximately three days and initiated taking a Z-pak which she had at home three days previous and on several occasions had taken some of her husbands cough syrup which contained hydrocodone.  Speculated that due to her exacerbation of asthmatic bronchitis, lowered her threshold for tolerance to the hydrocodone containing cough syrup contributing to her apparent confusion and slurred speech.  Nonetheless, in the emergency room she was seen by EDP and arterial blood gas on 28% O2 mask found low PO2 of 64 and CO2 of 40 with normal pH.  O2 saturations this evening off nasal oxygen dropped from 94 to 89%.  Apparently, on attempt to ambulate to the bathroom patient became dyspneic and was unable to stand.  Peak flows done before and after several HHN bronchodilator treatments maxed out at a low rate of 140 and 150.  Patient was not felt stable for discharge and followup at home.  Database collected in the emergency room included CBC finding normal WBC, CMET essentially normal with borderline elevated glucose at 149, and two blood cultures were also ordered by the emergency department physician.  Patients daughter reports the patients expectorated  sputum was thick, creamy, and yellowish-green to gray. Patient denies any fevers, chills, sweats, although she has noted somewhat amnestic for the last 24-36 hours time interval.  She had apparently had no other focal neurologic findings and seems oriented to all spheres at present.  PAST MEDICAL HISTORY:  Other problems include long-standing hypertension, history of COPD/asthma, GERD, allergic rhinitis, and hyperlipidemia.  No reported unusual childhood illnesses, history of rheumatic fever, scarlet fever, murmur, diabetes, kidney or thyroid disease.  History of spinal stenosis by lumbar CT scan 1994, diverticulosis by barium enema 1994, polymyalgia rheumatic type syndrome 1993 treated with steroids without recurrence.  Patient had negative collagen vascular workup at that time. Also, history of chronic intermittent lumbago and superficial phlebitis of the left leg.  DT booster August 1995.  Pneumovax August 1995.  Has annual flu vaccine each fall.  MEDICATIONS:  1. Verapamil 240 mg SR.  2. Paxil 20 mg.  3. Claritin D 12 b.i.d.  4. Singulair 10 mg.  5. Axid 75 mg p.r.n.  6. Flovent MDI.  7. Serevent MDI.  8. Zocor 80 mg h.s.  9. Vitamin E. 10. Vitamin C. 11. Multivitamin supplements.  ALLERGIES:  PENICILLIN with oral rash and intolerance reported in the past to [facility], BIAXIN, and LEVAQUIN with nausea/vomiting, questionable reaction to [facility] in the remote past.  PAST SURGICAL HISTORY:  1974 vaginal hysterectomy/AP repair-Dr. Verne Carrow, 1984 bladder suspension/AP repair-Dr. Verne Carrow.  FAMILY HISTORY:  Father deceased 64 arteriosclerotic heart disease.  Mother deceased 12 ASHD and stroke.  Deceased sister age 45 heart disease.  Second sister 21 15 years post CA colon without recurrence.  No brothers.  Four children, seven grandchildren, one great grandchild all alleged good health. Family positive for ASHD mother, father, sister, stroke mother, CA breast in aunt,  CA colon sister.  Negative HTN, thyroid disease, ______.  SOCIAL HISTORY:  Married 43 years.  Husband 18 YO with hypertension, atherosclerotic heart disease post CABG, COPD, and hyperlipidemia.  A 38-year smoking history, quitting 1995 alleging none since.  Right-handed.  Denies alcohol use.  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  REVIEW OF SYSTEMS:  Vision corrected by bifocals without complaints of diplopia, visual blurring, spots, flashes, hearing difficulty, tinnitus, dysarthria, dysphagia except as noted above.  Does have history of year-round nasal stuffiness/postnasal drainage exacerbating in spring and fall. RESPIRATORY:  Above.  No complaints of headaches, paraesthesias, postural dizziness, exertional chest pain, palpitations, orthopnea, PND, claudication, problems with dependent edema.  Has history of reflux disease with occasional heartburn related to dietary indiscretion usually promptly rescued by taking OTC Axid on a fairly regular basis, at least several times weekly, and denying any lower tract symptoms as nausea, vomiting, cramping, diarrhea, constipation, hematemesis, melena, hematochezia.  No nocturia, dysuria, incontinence.  Doe have history of occasional low back pain and stiffness in her hands and back, but no sciatica.  Also, history of some knee pains.  No neurologic symptoms.  PHYSICAL EXAMINATION:  VITAL SIGNS:  Blood pressure 167/77, pulse 83, respirations 32 on presentation, temperature 100.8.  SKIN:  Clear without rash, lesions, cyanosis, icterus, clubbing with patient noted on 2-3 L nasal oxygen.  HEENT:  EAC patent.  TMs are normal.  EOMs:  Full, conjugate.  VF full to gross confrontation.  Lens with questionable mild cataracts.  Pupils:  Light responses normal with fundoscopic likewise normal, unremarkable.  Disks:  Flat without nicking, hemorrhages, exudates seen.  Naso/oropharynx:  Clear with full dental  compensation.  Tongue has normal texture.  Oropharynx:  Moist without lesions.  NECK:  Supple.  Carotids 2+ normal upstroke.  No bruit, JVD, thyromegaly, or adenopathy.   CHEST:  Kyphotic with increased AP diameter in part due to senile kyphosis with lower anterior costal flare.  Symmetric expansion with breath sounds diminished bilateral, equal, with scattered coarse inspiratory rales, coarse expiratory rhonchi exacerbated by forced inspiration/expiration and cough.  BREASTS:  Cystic changes without masses, nipple discharge, fixations, or retractions.  COR:  No ______.  Heart sounds are soft, distant, regular without appreciable murmurs, gallops, clicks, or rubs noted.  EXTREMITIES:  Pedal pulse 2+ bilateral without bruits.  ABDOMEN:  Soft without palpable masses, tenderness, or organomegaly.  Bowel sounds are normal.  RECTAL:  Deferred.  Not felt clinically appropriate.  MUSCULOSKELETAL:  General range of motion full throughout.  No deformities.  NEUROLOGIC:  Gait and station not tested, but reported unstable by nurse due to weakness and dyspnea.  Oriented x 3.  No focal lateralizing neurologic signs are evident.  IMPRESSION: 1. Acute respiratory insufficiency secondary to diagnosis #2. 2. Asthmatic tracheobronchitis. 3. Chronic obstructive pulmonary disease. 4. Hypertension. 5. Gastroesophageal reflux disease. 6. Hyperlipidemia. 7. Allergic rhinitis.  PLAN:  Admit as per orders for IV hydration, parenteral steroids, and aggressive bronchopulmonary and nebulized inhalant regimen.DD:  09/14/00 TD:  09/14/00 Job: [id number]

## 2011-01-08 NOTE — H&P (Signed)
NAME:  [name], [name]                        ACCOUNT NO.:  [id number]   MEDICAL RECORD NO.:  [id number]                   PATIENT TYPE:  EMS   LOCATION:  [facility]                                 FACILITY:  [facility]   PHYSICIAN:  Lovenia Kim, D.O.              DATE OF BIRTH:  29-Oct-1931   DATE OF ADMISSION:  09/30/2002  DATE OF DISCHARGE:                                HISTORY & PHYSICAL   HISTORY OF PRESENT ILLNESS:  The patient is a 77 year old patient of Lucky Cowboy, M.D., who comes into today to the emergency room complaining of  increasing shortness of breath, dyspnea on exertion, and cough over the last  three weeks.  She has actually been to our office on three separate  occasions for asthma exacerbation and has been treated with a cortisone  injection, as well as antibiotics and nebulizer treatment.  She gets  improved temporarily, but then her symptoms recur.  She has noticed today  especially that her dyspnea on exertion has been a lot worse, although at  the time of this interview she was on oxygen and feeling much better.  She  offered the information today that about two weeks she was given some  candles and she is wondering if maybe this is what is causing her asthma  exacerbation as her husband has also been complaining that he has not felt  well since the candles have been in the house.   PAST MEDICAL HISTORY:  1. Hypertension.  2. Asthma.  3. Seasonal allergies.  4. Depression.  5. Hyperlipidemia.   PAST SURGICAL HISTORY:  1. Partial hysterectomy.  2. Appendectomy.   CURRENT MEDICATIONS:  1. Flovent 110 mcg two puffs twice a day.  2. Serevent two puffs twice a day.  3. Singulair 10 mg daily.  4. Diltiazem 240 mg daily.  5. Paxil 10 mg daily.  6. Zocor 80 mg half of a pill daily.   ALLERGIES:  She has no known drug allergies.   SOCIAL HISTORY:  She quit smoking about 15 years ago.  She does not drink  alcohol.  She is married and lives at home with  her husband.  She has four  children.   FAMILY HISTORY:  Noncontributory.   REVIEW OF SYSTEMS:  She denies recent chest pain or pleuritic-type pain.  She has had shortness of breath and dyspnea on exertion.  No heart  palpitations, syncope, or presyncope.  No nausea, vomiting, diarrhea, or  constipation.  No joint or muscle pain.   PHYSICAL EXAMINATION:  VITAL SIGNS:  The blood pressure is 133/57, heart  rate 84, respiratory rate 28, temperature 97.9 degrees, and O2 saturation  89% on room air and 94% on 2 L via nasal cannula.  HEENT:  Her pupils are equal and react equally.  The mucous membranes are  moist.  NECK:  She has no carotid bruits, thyromegaly, or JVD.  HEART:  Regular rate and rhythm without murmur.  LUNGS:  Clear to auscultation anteriorly and posteriorly without crackles or  wheezes.  No forced end-expiratory wheezing is noted on exam today.  ABDOMEN:  Soft and nontender.  No hepatosplenomegaly.  Normal bowel sounds.  EXTREMITIES:  No edema, cyanosis, or calf tenderness.  Distal pulses 2+ of  the lower extremities, which are equal bilaterally.   LABORATORY DATA:  White blood cell count 7.7 without a left shift,  hematocrit 43.1, platelet count 183.  Sodium 139, potassium 4.3, chloride  105, CO2 26, BUN 11, creatinine 0.8, blood sugar 113.  She had a D-dimer  which was slightly elevated at 0.55.  She underwent a chest CT and lower  extremity CT which was negative for DVT or pulmonary embolus.  Her CPK was  normal at 77 with a normal troponin at less than 0.01.  The EKG showed a  normal sinus rhythm at 82 beats per minute with incomplete left bundle  branch block and no acute ST changes.   ASSESSMENT AND PLAN:  1. Asthma exacerbation.  Likely this is allergic versus infectious and she     has been treated with antibiotics as an outpatient without any     significant change in her symptoms, so I am not going to repeat those at     this point.  She did also have a chest  x-ray which showed some mild     atelectasis, but no infiltrate to suggest pneumonia.  Will treat her with     prednisone 60 mg IV today and tomorrow.  Will keep her on her Singulair,     Flovent, and albuterol/Atrovent nebulizers q.6h..  Will also add Zyrtec     10 mg daily for further treat the infectious component.  I have suggested     to her family that they go ahead and get the candles out of the house and     anything else that is new or they think may be triggering her asthma so     that when she does go home hopefully this will stay calm.  Will check     labs in the morning and also recheck CK and troponin just to make sure     there is no evidence of heart ischemia.  Continue with oxygen to keep her     saturations greater than 90%.  2. Hypertension.  Will continue her on diltiazem 240 mg once a day.  Her     blood pressure is currently controlled.  3. Hyperlipidemia.  Continue her on Zocor 40 mg daily.  4. Depression.  I also going to continue her on Paxil 10 mg daily to avoid     any withdrawal symptoms.                                               Lovenia Kim, D.O.    ARS/MEDQ  D:  09/30/2002  T:  09/30/2002  Job:  [id number]

## 2011-01-08 NOTE — Discharge Summary (Signed)
[facility]  Patient:    [name], [name]                     MRN: [id number] Adm. Date:  [id number] Disc. Date: [id number] Attending:  Nadean Corwin                           Discharge Summary  FINAL DIAGNOSES: 1. Acute respiratory insufficiency. 2. Asthmatic bronchitis. 3. Chronic obstructive pulmonary disease and emphysema, moderately severe. 4. Hypertension. 5. Glucose intolerance. 6. Hypercholesterolemia. 7. Gastroesophageal reflux disease.  PROCEDURES:  None.  COMPLICATIONS  None.  HOSPITAL SUMMARY:  The patient is a delightful, 77 year old, married, white female with multiple medical problems including hypertension, hyperlipidemia, GERD, COPD with asthma, who presented with a three day history of increasing chest congestion and dyspnea, was brought to the emergency room confused and found hypoxic with precipitating factor of confusion felt aggravated by her hypoxia and taking her husbands cough syrup containing hydrocodone.  The patient was noted dyspneic with speech, on supplemental nasal oxygen, and attempts to ambulate in the emergency room to the bathroom she became extremely short of breath.  Despite several inhalant bronchodilator treatments, her wheezing persisted, and arrangements were made for subsequent admission to the hospital.  For details, see admission note.  Following admission, the patient had blood cultures obtained which subsequently returned negative, and chest x-ray showed severe COPD without infiltrates.  The patient was begun on parenteral antibiotics with Levaquin or Tequin and given high-dose intravenous steroids, continued on her home bronchodilator regimen with Serevent and Flovent as well as the addition of hand-held nebulizer with albuterol and ______ .  The patient gradually improved over the next several days with vital signs remaining stable and O2 saturations remaining in sufficient range on  supplemental oxygen.  Follow-up chest x-ray showed no acute infiltrates and as the patient continued to improve, subsequent arrangements were made for discharge home.  The patient did demonstrate mild glucose intolerance with random glucoses measuring as high as 185 and 160 mg percent with prednisone being discontinued at the time of discharge.  ACCESSORY CLINICAL FINDINGS:  Two blood cultures on admission negative. Arterial blood gas on admission 28% mask found pH 7.40, PO2 65, PCO2 39.  CBC with normal hemoglobin 13.9 gram percent, WBC 7800, normal WBC differential. BMET had normal BUN and creatinine of 14 and 0.9 respectively with slightly low sodium 133, normal potassium 4.2, normal chloride, CO2, and calcium was slightly elevated, random glucose 149 mg percent.  Chest x-ray on follow-up showed COPD with fibrosis.  DISPOSITION:  The patient was discharged home to continue prehospitalization regimen.  DISCHARGE MEDICATIONS:  1. Paxil 20 mg q.h.s.  2. Verapamil 240 mg SR q.d.  3. Singulair 10 mg.  4. Zocor 80 mg q.h.s.  5. Axid 150 mg b.i.d. p.r.n.  6. Flovent and Serevent inhalers 2 inhalations each b.i.d.  7. Combivent inhaler 2 inhalations q.4h.  8. Claritin D 12 b.i.d. p.r.n.  9. New prescription for Levaquin 500 mg tabs #10 q.d. pc 10 days. 10. Xanax 0.5 mg tabs 1/2-1 t.i.d.-q.i.d. p.r.n. anxiety and advised to     judiciously use her husbands Histussin HC cough syrup, limiting to 1/2     teaspoon as needed.  Advised to continue a low cholesterol diet.  Arrangements are being made through discharge planning for home O2 to continue 2 liters oxygen at least 16 hours per day.  The patients O2 saturations  on room air measured 86% per nurse report.  CONDITION:  Stable and improved.  PROGNOSIS:  Fair.  Advised office follow-up one week or p.r.n. DD:  09/17/00 TD:  09/17/00 Job: [id number]

## 2011-01-08 NOTE — Op Note (Signed)
NAMEAYLIANA, CASCIANO              ACCOUNT NO.:  [id number]   MEDICAL RECORD NO.:  [id number]          PATIENT TYPE:  AMB   LOCATION:  [facility]                         FACILITY:  [facility]   PHYSICIAN:  Deidre Ala, M.D.    DATE OF BIRTH:  [birth year]   DATE OF PROCEDURE:  05/05/2006  DATE OF DISCHARGE:                                 OPERATIVE REPORT   PREOPERATIVE DIAGNOSIS:  Left knee osteoarthritis, rule out degenerative  medial and lateral meniscus tears, not bone-on-bone.   POSTOPERATIVE DIAGNOSES:  1. __________ knee displaced stellate complex posterior horn degenerative      medial meniscus tear.  2. Degenerative significant inner rim lateral meniscus tear.  3. Grade 3 degenerative joint disease, medial femoral condyle, medial      tibial plateau; grade 2 trochlea and posterior patella.  4. Tight lateral retinaculum.  5. Tricompartment synovitis.   OPERATION:  1. Left knee operative arthroscopy with partial medial and lateral      meniscectomies.  2. Abrasion ablation chondroplasties, tricompartment.  3. Arthroscopic lateral retinacular release.  4. Tricompartment synovectomies and plica excision.   SURGEON:  1. Charlesetta Shanks, M.D.   ASSISTANT:  Clarene Reamer, P.A.-C.   ANESTHESIA:  General with LMA.   CULTURES:  None.   DRAINS:  None.   ESTIMATED BLOOD LOSS:  Minimal.   TOURNIQUET TIME:  45 minutes.   PATHOLOGIC FINDINGS AND HISTORY:  [name] is a 77 year old female sent to  me for consultation by Lucky Cowboy, M.D.  She had left knee pain.  Exam  was consistent with narrowing of the medial joint line, a flare-up of  osteoarthritis on August 2007.  We gave her her cortisone injection.  She  initially had immediate pain relief and increased flexion.  She came back in  April 22, 2006, but did not have long-lasting effects.  She had some  catching, locking, giving away, the cortisone drove her blood pressure up,  and she desired to proceed with a knee  arthroscopy with debridement since  she was still not bone-on-bone.  At surgery she had a degenerative posterior  horn medial meniscus stellate complex displaceable with grade 3 changes on  the medial femoral condyle and medial tibial plateau.  ACL was intact.  The  lateral meniscus was degenerative with significant tearing from front to  back on the inner rim.  She had grade 1-2 changes on the lateral femoral,  grade 2-3 on the lateral tibial plateau.  The trochlea had grade 2 changes  over the entire trochlea as well as the posterior patella.  She had  synovitis in the gutters medially and laterally with a tight lateral  retinaculum with lateral patellar tilt and track.  She had pouch synovitis.  All this was debrided, smoothed and ablated, with a lateral release carried  out and menisci saucerized back to stable rims.   PROCEDURE:  With adequate anesthesia obtained using LMA technique, 1 g Ancef  given for IV prophylaxis, the patient was placed in a supine position.  The  left lower extremity was prepped from the malleoli to the leg holder  in the  standard fashion.  After standard prepping and draping, Esmarch  exsanguination was used.  The tourniquet was let up to 350 mmHg.  Superior  and lateral inflow portal was made and the knee was insufflated with normal  saline with an arthroscopic pump.  Medial and lateral scope portals were  then made and the joint was thoroughly inspected.  Then she had the medial  plica back to the sidewall and lysed the medial band.  Bleeding points were  cauterized.  I then used a basket to remove the medial osteophyte along with  a pituitary.  I then exposed the medial meniscus and with a basket and  shaver saucerized the medial meniscus back to a stable rim.  I smoothed the  medial tibial plateau and medial femoral condyle with a shaver and then used  the ablator on 1 to smooth the inner meniscal rim as well as the articular  surface of the tibiofemoral  joint.  I then reversed portals, shaved out the  lateral plica.  I then exposed the lateral meniscus and used basket and  shaver to saucerize its inner rim to a smooth layer as well as lightly  shaved on the posterior tibial plateau.  All of this was smoothed with the  ablator.  I then observed tilt and tract, and I smoothed the posterior  surface of the patella as well as the trochlea with the shaver and ablator.  I then released the lateral retinaculum from vastus lateralis to the joint  line with note improved tilt and tract.  Pouch synovitis was removed.  The  knee was irrigated through the scope.  Marcaine 0.5% with morphine was  injected about the joint.  The portals were left open.  A bulky sterile  compressive dressing was applied with lateral foam pad for tamponade and  Easy Wrap placed.  The patient then, having tolerated the procedure well,  was awakened and taken to the recovery room in satisfactory condition to be  discharged per outpatient routine and given Percocet for pain and told to  call the office for appointment for recheck tomorrow.  Laboratory data  within normal limits.           ______________________________  V. Charlesetta Shanks, M.D.     VEP/MEDQ  D:  05/05/2006  T:  05/06/2006  Job:  [id number]   cc:   Lucky Cowboy, M.D.  Fax: [phone]

## 2011-01-08 NOTE — Discharge Summary (Signed)
[name], [name]                        ACCOUNT NO.:  [id number]   MEDICAL RECORD NO.:  [id number]                   PATIENT TYPE:  INP   LOCATION:  5032                                 FACILITY:  [facility]   PHYSICIAN:  Lucky Cowboy, M.D.               DATE OF BIRTH:  December 16, 1931   DATE OF ADMISSION:  09/30/2002  DATE OF DISCHARGE:  10/02/2002                                 DISCHARGE SUMMARY   FINAL DIAGNOSES:  1. Acute respiratory insufficiency.  2. Chronic obstructive pulmonary disease with asthmatic exacerbation.  3. Hypertension.  4. Gastroesophageal reflux disease.  5. Glucose intolerance.   PROCEDURES:  None.   COMPLICATIONS:  None.   HOSPITAL SUMMARY:  The patient is a delightful 77 year old married white  female with history of multiple problems including hypertension, asthma,  cranial allergic seasonal rhinitis, history of depression, and  hyperlipidemia, who presented to the emergency room in acute respiratory  distress attributed to environmental exposures.  The patient was found to  have an O2 sat in the range of 89% on room air, and with concomitant  dyspnea, and it was felt prudent to admit her for evaluation.  For details,  please see Dr. Hardie Pulley admission note.   Following admission, the patient was given IV steroids and nebulized  bronchodilator treatments, as well as supplemental oxygen by nasal cannula  with apparent clinical improvement and stabilization of her condition.  The  patient did not have any history of putrid sputum, fever, or chills,  although her initial WBC was slightly elevated, as was her temperature just  following admission at 99 degrees.  CPK and troponins, as well as repeats  during hospitalization, were normal, and chest CT scan was negative for  pulmonary embolus.  EKG showed IVCD with incomplete left bundle branch block  pattern and no acute changes.  The patient was monitored during her initial  hospital course and begun  on anti-allergy type protocol with intravenous  steroids, Singulair, and Zyrtec with inhalant bronchodilator therapy, and  her condition stabilized and improved.  On the day following admission, the  patient was noted to have O2 saturations dropping to 87-88% on room air, and  again rising to 93-94% with restitution of her nasal cannula.  As the  patient was felt to have improved, stabilized, and achieved maximum benefit  of hospitalization, arrangements were made for discharge and follow up as an  outpatient.   LABORATORY DATA:  Accessory clinical findings include initial CBC with  hemoglobin of 14.5 gm%, WBC 7700, CPK 77, with MB of 1.7, and troponin less  than 0.01 ng/ml with negative D-dimer, and CT lung scan failed to rule out  pulmonary emboli or occult DVT.  The patient also had CMET showing no  significant abnormalities with glucoses 113 mg%, and followup CMET during  hospitalization did show a slight rise in random glucose of 167, consistent  with mild glucose intolerance.  The remainder of the CMET was within normal  limits with repeat CBC showing stable hemoglobin of 14.7 gm%, WBC 7400, as  well as negative CPK and troponin levels.  EKG as above.   DISPOSITION:  The patient was to be discharged home at the recognizance of  the family to continue her prehospital medications and advised to resume her  home Advair 100/50 Diskus on a b.i.d. schedule.  The patient also had a  Combivent inhaler at home, and was instructed to use two inhalations q.4h.  while awake.  Given a new  prescription for Z-Pak, prednisone 20 mg tabs #30, taken b.i.d., and  Histussin HC expectorant 1-2 teaspoons q.4h. p.r.n.  Arrangements were made  for the patient to be delivered home nasal oxygen by cannula administered at  three liters per minute.  The patient is scheduled a follow up office visit  in three days post-discharge.                                               Lucky Cowboy, M.D.     WM/MEDQ  D:  10/01/2002  T:  10/01/2002  Job:  [id number]

## 2011-01-08 NOTE — Op Note (Signed)
NAMEJAHAIRA, EARNHART              ACCOUNT NO.:  [id number]   MEDICAL RECORD NO.:  [id number]          PATIENT TYPE:  AMB   LOCATION:  DSC                          FACILITY:  [facility]   PHYSICIAN:  Matthew A. Weingold, M.D.DATE OF BIRTH:  06-25-1932   DATE OF PROCEDURE:  06/02/2005  DATE OF DISCHARGE:                                 OPERATIVE REPORT   PREOPERATIVE DIAGNOSIS:  Left long finger volar mass with stenosing  tenosynovitis.   POSTOPERATIVE DIAGNOSIS:  Left long finger volar mass with stenosing  tenosynovitis.   PROCEDURE:  Left long finger volar mass excision and A1 pulley release.   SURGEON:  Artist Pais. Mina Marble, M.D.   ASSISTANT:  Aura Fey. Bobbe Medico.   ANESTHESIA:  General anesthesia.   TOURNIQUET TIME:  20 minutes.   COMPLICATIONS:  None.   DRAINS:  None.   DESCRIPTION OF PROCEDURE:  Patient was taken to the operating room.  After  the induction of general anesthesia, left upper extremity was prepped and  draped in the usual sterile fashion.  Esmarch was used to exsanguinate the  limb.  Tourniquet was inflated to 250 mmHg.  At this point in time, a  Brunner incision was made over the A1 pulley area of the long finger and  only base flap was elevated.  Dissection was carried down to the flexor  sheath.  The A1 pulley was released.  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  The wound was then  thoroughly irrigated.  A synovectomy was performed locally in the area of  the incision and small amounts of hypertrophic synovium removed from the  flexor sheath.  The wound was then loosely closed with 5-0 nylon.  A sterile  dressing with Xeroform, 4x4s, fluffs and compressive dressing was applied.  The patient tolerated the procedure well and went to the recovery room in  stable condition.      Artist Pais Mina Marble, M.D.  Electronically Signed    MAW/MEDQ  D:  06/02/2005  T:  06/02/2005  Job:  [id number]   cc:   Lucky Cowboy, M.D.  Fax: [phone]

## 2011-01-08 NOTE — Op Note (Signed)
NAMERHINA, KRAMME              ACCOUNT NO.:  [id number]   MEDICAL RECORD NO.:  [id number]          PATIENT TYPE:  OUT   LOCATION:  DFTL                         FACILITY:  [facility]   PHYSICIAN:  Artist Pais. Weingold, M.D.DATE OF BIRTH:  [birth year]   DATE OF PROCEDURE:  06/02/2005  DATE OF DISCHARGE:  06/02/2005                                 OPERATIVE REPORT   PREOPERATIVE DIAGNOSIS:  Left long finger mass and triggering.   POSTOPERATIVE DIAGNOSIS:  Left long finger mass and triggering.   OPERATION PERFORMED:  Left long finger mass excision, A-1 pulley release.   SURGEON:  Artist Pais. Mina Marble, M.D.   ASSISTANT:  Aura Fey. Bobbe Medico.   ANESTHESIA:  General.   DESCRIPTION OF PROCEDURE:  Prepped and draped in sterile fashion.  Esmarch  used to exsanguinate the limb. Tourniquet inflated to 250 mmHg.  At this  point Brunner incision was made over the left long finger A-1 pulley area.  The skin was incised, flaps were raised accordingly, dissection was carried  down to the A-1 pulley area.  A large cystic mass was encountered and  resected and sent for pathologic confirmation.  The A-1 pulley was  identified and split with a 15 blade.  The FDP and FDS tendons were lysed of  all adhesions.  Wound was then thoroughly irrigated and loosely closed with  5-0 nylon.  Sterile dressing, Xeroform, 4 x 4s, compressive dressing was  applied.  The patient tolerated the procedure well, went to recovery room in  stable fashion.      Artist Pais Mina Marble, M.D.  Electronically Signed     MAW/MEDQ  D:  06/23/2005  T:  06/24/2005  Job:  [id number]

## 2011-01-08 NOTE — Op Note (Signed)
   [name], [name]                        ACCOUNT NO.:  [id number]   MEDICAL RECORD NO.:  [id number]                   PATIENT TYPE:  AMB   LOCATION:  DSC                                  FACILITY:  [facility]   PHYSICIAN:  Thornton Park. Daphine Deutscher, M.D.             DATE OF BIRTH:  1931/09/14   DATE OF PROCEDURE:  05/25/2002  DATE OF DISCHARGE:                                 OPERATIVE REPORT   PREOPERATIVE DIAGNOSIS:  Microcalcification's, right breast.   POSTOPERATIVE DIAGNOSIS:  Microcalcification's, right breast.   PROCEDURE:  Needle localized right breast biopsy.   SURGEON:  Thornton Park. Daphine Deutscher, M.D.   DESCRIPTION OF PROCEDURE:  [name] is a 77 year old lady who had some  microcalcification's that were deemed suspicious.  A wire was placed by Dr.  Kearney Hard.  It seemed to go beyond the calcifications.  The patient had the  breast prepped with Betadine and draped sterilely after needle localization.  I made a transverse incision incorporating the wire and affixed it with a 4-  0 Vicryl.  I then cut out a core following the wire downward.  It did go  rather deep to the nipple, and I got down below the wire and came medial to  try to sweep out the lesion, which seemed to be medial to the wire.  Once  removed, it was sent for specimen mammography which confirmed the presence  of the microcalcification's in the specimen.  In addition, I went deeper and  took a separate section of tissue.  It was the only tissue that appeared to  have any more substance to it, with most of the breast being a fatty  replacement.  This was sent as a separate specimen for permanent exam.  There is nothing in the specimen or the remaining breast tissue that was  grossly diagnostic or suspicious of cancer.  Bleeding was controlled by  going in and irrigating the space, and I found one inferolateral  neurovascular bundle that was ligated with 4-0 Vicryl suture ligature.  Any  oozing spots were controlled  with electrocautery.  The wound was irrigated  with saline and closed with 4-0 and 5-0 Vicryl with Benzoin and Steri-  Strips.  The patient seemed to tolerate the procedure well.  She will be  given Tylox to take as needed for pain, and will be followed up in the  office in two weeks.                                               Thornton Park Daphine Deutscher, M.D.    MBM/MEDQ  D:  05/25/2002  T:  05/28/2002  Job:  [id number]   cc:   Lucky Cowboy, M.D.  [address]  [place], [place] [address]  Fax: [phone]

## 2011-03-26 ENCOUNTER — Emergency Department (HOSPITAL_COMMUNITY): Payer: Medicare Other

## 2011-03-26 ENCOUNTER — Encounter: Payer: Self-pay | Admitting: Cardiology

## 2011-03-26 ENCOUNTER — Emergency Department (HOSPITAL_COMMUNITY)
Admission: EM | Admit: 2011-03-26 | Discharge: 2011-03-27 | Disposition: A | Discharge status: Home / Self Care | Payer: Medicare Other | Attending: Emergency Medicine | Admitting: Emergency Medicine

## 2011-03-26 DIAGNOSIS — J4489 Other specified chronic obstructive pulmonary disease: Secondary | ICD-10-CM | POA: Insufficient documentation

## 2011-03-26 DIAGNOSIS — Z7982 Long term (current) use of aspirin: Secondary | ICD-10-CM | POA: Insufficient documentation

## 2011-03-26 DIAGNOSIS — S0180XA Unspecified open wound of other part of head, initial encounter: Secondary | ICD-10-CM | POA: Insufficient documentation

## 2011-03-26 DIAGNOSIS — W010XXA Fall on same level from slipping, tripping and stumbling without subsequent striking against object, initial encounter: Secondary | ICD-10-CM | POA: Insufficient documentation

## 2011-03-26 DIAGNOSIS — J449 Chronic obstructive pulmonary disease, unspecified: Secondary | ICD-10-CM | POA: Insufficient documentation

## 2011-03-26 DIAGNOSIS — Z79899 Other long term (current) drug therapy: Secondary | ICD-10-CM | POA: Insufficient documentation

## 2011-03-26 DIAGNOSIS — Z950 Presence of cardiac pacemaker: Secondary | ICD-10-CM | POA: Insufficient documentation

## 2011-03-26 DIAGNOSIS — E7800 Pure hypercholesterolemia, unspecified: Secondary | ICD-10-CM | POA: Insufficient documentation

## 2011-03-26 DIAGNOSIS — S0993XA Unspecified injury of face, initial encounter: Secondary | ICD-10-CM | POA: Insufficient documentation

## 2011-03-26 DIAGNOSIS — Y92009 Unspecified place in unspecified non-institutional (private) residence as the place of occurrence of the external cause: Secondary | ICD-10-CM | POA: Insufficient documentation

## 2011-03-26 DIAGNOSIS — I10 Essential (primary) hypertension: Secondary | ICD-10-CM | POA: Insufficient documentation

## 2011-05-24 LAB — BASIC METABOLIC PANEL
BUN: 14
BUN: 15
BUN: 20
CO2: 28
CO2: 29
Calcium: 9
Calcium: 9.5
Chloride: 106
Creatinine, Ser: 1.57 — ABNORMAL HIGH
Creatinine, Ser: 1.61 — ABNORMAL HIGH
GFR calc non Af Amer: 31 — ABNORMAL LOW
GFR calc non Af Amer: 36 — ABNORMAL LOW
Glucose, Bld: 108 — ABNORMAL HIGH
Glucose, Bld: 117 — ABNORMAL HIGH
Glucose, Bld: 118 — ABNORMAL HIGH
Potassium: 3.6
Sodium: 139

## 2011-05-24 LAB — CBC
HCT: 37.3
Hemoglobin: 12.7
MCHC: 33.5
MCHC: 34
MCV: 95
Platelets: 110 — ABNORMAL LOW
RDW: 13.7
RDW: 13.9

## 2011-05-24 LAB — DIFFERENTIAL
Basophils Absolute: 0
Basophils Relative: 0
Eosinophils Absolute: 0.1
Neutro Abs: 4.2
Neutrophils Relative %: 68

## 2011-05-24 LAB — POCT I-STAT 3, ART BLOOD GAS (G3+)
Bicarbonate: 29.4 — ABNORMAL HIGH
O2 Saturation: 94
TCO2: 31
pCO2 arterial: 49.2 — ABNORMAL HIGH

## 2011-05-24 LAB — POCT I-STAT 3, VENOUS BLOOD GAS (G3P V)
Bicarbonate: 27.7 — ABNORMAL HIGH
TCO2: 29
pH, Ven: 7.345 — ABNORMAL HIGH
pO2, Ven: 33

## 2011-05-24 LAB — PROTIME-INR: INR: 0.9

## 2011-05-24 LAB — B-NATRIURETIC PEPTIDE (CONVERTED LAB): Pro B Natriuretic peptide (BNP): 305 — ABNORMAL HIGH

## 2011-06-08 ENCOUNTER — Other Ambulatory Visit (HOSPITAL_COMMUNITY): Payer: Self-pay | Admitting: Internal Medicine

## 2011-06-08 ENCOUNTER — Ambulatory Visit (HOSPITAL_COMMUNITY)
Admission: RE | Admit: 2011-06-08 | Discharge: 2011-06-08 | Disposition: A | Discharge status: Home / Self Care | Payer: Medicare Other | Source: Ambulatory Visit | Attending: Internal Medicine | Admitting: Internal Medicine

## 2011-06-08 DIAGNOSIS — J449 Chronic obstructive pulmonary disease, unspecified: Secondary | ICD-10-CM | POA: Insufficient documentation

## 2011-06-08 DIAGNOSIS — J4489 Other specified chronic obstructive pulmonary disease: Secondary | ICD-10-CM | POA: Insufficient documentation

## 2011-06-08 DIAGNOSIS — I10 Essential (primary) hypertension: Secondary | ICD-10-CM | POA: Insufficient documentation

## 2011-06-08 DIAGNOSIS — R0602 Shortness of breath: Secondary | ICD-10-CM

## 2011-06-28 ENCOUNTER — Other Ambulatory Visit (HOSPITAL_COMMUNITY): Payer: Self-pay | Admitting: Internal Medicine

## 2011-06-28 DIAGNOSIS — Z1231 Encounter for screening mammogram for malignant neoplasm of breast: Secondary | ICD-10-CM

## 2011-07-07 ENCOUNTER — Ambulatory Visit (INDEPENDENT_AMBULATORY_CARE_PROVIDER_SITE_OTHER): Payer: Medicare Other | Admitting: *Deleted

## 2011-07-07 ENCOUNTER — Encounter: Payer: Self-pay | Admitting: Internal Medicine

## 2011-07-07 DIAGNOSIS — I509 Heart failure, unspecified: Secondary | ICD-10-CM

## 2011-07-07 LAB — PACEMAKER DEVICE OBSERVATION
AL THRESHOLD: 1 V
BAMS-0001: 150 {beats}/min
BATTERY VOLTAGE: 3.001 V
RV LEAD THRESHOLD: 0.5 V

## 2011-07-07 NOTE — Progress Notes (Signed)
PPM check 

## 2011-07-14 ENCOUNTER — Ambulatory Visit (HOSPITAL_COMMUNITY)
Admission: RE | Admit: 2011-07-14 | Discharge: 2011-07-14 | Disposition: A | Discharge status: Home / Self Care | Payer: Medicare Other | Source: Ambulatory Visit | Attending: Internal Medicine | Admitting: Internal Medicine

## 2011-07-14 DIAGNOSIS — Z1231 Encounter for screening mammogram for malignant neoplasm of breast: Secondary | ICD-10-CM | POA: Insufficient documentation

## 2011-08-11 ENCOUNTER — Other Ambulatory Visit: Payer: Self-pay

## 2011-08-11 ENCOUNTER — Inpatient Hospital Stay (HOSPITAL_COMMUNITY)
Admission: EM | Admit: 2011-08-11 | Discharge: 2011-08-19 | DRG: 208 | Disposition: A | Discharge status: Home / Self Care | Payer: Medicare Other | Attending: Pulmonary Disease | Admitting: Pulmonary Disease

## 2011-08-11 ENCOUNTER — Emergency Department (HOSPITAL_COMMUNITY): Payer: Medicare Other

## 2011-08-11 DIAGNOSIS — R6521 Severe sepsis with septic shock: Secondary | ICD-10-CM

## 2011-08-11 DIAGNOSIS — I248 Other forms of acute ischemic heart disease: Secondary | ICD-10-CM | POA: Diagnosis present

## 2011-08-11 DIAGNOSIS — F341 Dysthymic disorder: Secondary | ICD-10-CM

## 2011-08-11 DIAGNOSIS — Z79899 Other long term (current) drug therapy: Secondary | ICD-10-CM

## 2011-08-11 DIAGNOSIS — E119 Type 2 diabetes mellitus without complications: Secondary | ICD-10-CM | POA: Diagnosis present

## 2011-08-11 DIAGNOSIS — J189 Pneumonia, unspecified organism: Principal | ICD-10-CM

## 2011-08-11 DIAGNOSIS — J9600 Acute respiratory failure, unspecified whether with hypoxia or hypercapnia: Secondary | ICD-10-CM | POA: Diagnosis present

## 2011-08-11 DIAGNOSIS — A419 Sepsis, unspecified organism: Secondary | ICD-10-CM

## 2011-08-11 DIAGNOSIS — Z882 Allergy status to sulfonamides status: Secondary | ICD-10-CM

## 2011-08-11 DIAGNOSIS — Z7982 Long term (current) use of aspirin: Secondary | ICD-10-CM

## 2011-08-11 DIAGNOSIS — I10 Essential (primary) hypertension: Secondary | ICD-10-CM

## 2011-08-11 DIAGNOSIS — I714 Abdominal aortic aneurysm, without rupture, unspecified: Secondary | ICD-10-CM

## 2011-08-11 DIAGNOSIS — R0902 Hypoxemia: Secondary | ICD-10-CM

## 2011-08-11 DIAGNOSIS — I4892 Unspecified atrial flutter: Secondary | ICD-10-CM | POA: Diagnosis present

## 2011-08-11 DIAGNOSIS — J449 Chronic obstructive pulmonary disease, unspecified: Secondary | ICD-10-CM

## 2011-08-11 DIAGNOSIS — N390 Urinary tract infection, site not specified: Secondary | ICD-10-CM | POA: Diagnosis present

## 2011-08-11 DIAGNOSIS — I442 Atrioventricular block, complete: Secondary | ICD-10-CM

## 2011-08-11 DIAGNOSIS — N179 Acute kidney failure, unspecified: Secondary | ICD-10-CM

## 2011-08-11 DIAGNOSIS — I279 Pulmonary heart disease, unspecified: Secondary | ICD-10-CM | POA: Diagnosis present

## 2011-08-11 DIAGNOSIS — J4489 Other specified chronic obstructive pulmonary disease: Secondary | ICD-10-CM

## 2011-08-11 DIAGNOSIS — R911 Solitary pulmonary nodule: Secondary | ICD-10-CM | POA: Diagnosis present

## 2011-08-11 DIAGNOSIS — R4182 Altered mental status, unspecified: Secondary | ICD-10-CM

## 2011-08-11 DIAGNOSIS — I2489 Other forms of acute ischemic heart disease: Secondary | ICD-10-CM | POA: Diagnosis present

## 2011-08-11 DIAGNOSIS — Z9981 Dependence on supplemental oxygen: Secondary | ICD-10-CM

## 2011-08-11 DIAGNOSIS — Z950 Presence of cardiac pacemaker: Secondary | ICD-10-CM

## 2011-08-11 DIAGNOSIS — K219 Gastro-esophageal reflux disease without esophagitis: Secondary | ICD-10-CM

## 2011-08-11 DIAGNOSIS — E785 Hyperlipidemia, unspecified: Secondary | ICD-10-CM | POA: Diagnosis present

## 2011-08-11 DIAGNOSIS — J9690 Respiratory failure, unspecified, unspecified whether with hypoxia or hypercapnia: Secondary | ICD-10-CM

## 2011-08-11 DIAGNOSIS — Z860100 Personal history of colon polyps, unspecified: Secondary | ICD-10-CM

## 2011-08-11 DIAGNOSIS — D72829 Elevated white blood cell count, unspecified: Secondary | ICD-10-CM

## 2011-08-11 DIAGNOSIS — I509 Heart failure, unspecified: Secondary | ICD-10-CM

## 2011-08-11 DIAGNOSIS — I4891 Unspecified atrial fibrillation: Secondary | ICD-10-CM | POA: Diagnosis present

## 2011-08-11 DIAGNOSIS — R6520 Severe sepsis without septic shock: Secondary | ICD-10-CM | POA: Diagnosis not present

## 2011-08-11 DIAGNOSIS — J13 Pneumonia due to Streptococcus pneumoniae: Secondary | ICD-10-CM

## 2011-08-11 DIAGNOSIS — F411 Generalized anxiety disorder: Secondary | ICD-10-CM | POA: Diagnosis present

## 2011-08-11 DIAGNOSIS — J45909 Unspecified asthma, uncomplicated: Secondary | ICD-10-CM

## 2011-08-11 DIAGNOSIS — J441 Chronic obstructive pulmonary disease with (acute) exacerbation: Secondary | ICD-10-CM | POA: Diagnosis not present

## 2011-08-11 LAB — POCT I-STAT 3, ART BLOOD GAS (G3+)
O2 Saturation: 89 %
O2 Saturation: 98 %
TCO2: 28 mmol/L (ref 0–100)
pCO2 arterial: 64 mmHg (ref 35.0–45.0)
pH, Arterial: 7.211 — ABNORMAL LOW (ref 7.350–7.400)
pO2, Arterial: 132 mmHg — ABNORMAL HIGH (ref 80.0–100.0)

## 2011-08-11 LAB — BASIC METABOLIC PANEL
Calcium: 8.8 mg/dL (ref 8.4–10.5)
GFR calc Af Amer: 30 mL/min — ABNORMAL LOW (ref >90)
GFR calc non Af Amer: 26 mL/min — ABNORMAL LOW (ref >90)
Glucose, Bld: 188 mg/dL — ABNORMAL HIGH (ref 70–99)
Potassium: 3.5 mEq/L (ref 3.5–5.1)
Sodium: 140 mEq/L (ref 135–145)

## 2011-08-11 LAB — URINALYSIS, ROUTINE W REFLEX MICROSCOPIC
Nitrite: NEGATIVE
Protein, ur: 100 mg/dL — AB
Urobilinogen, UA: 1 mg/dL (ref 0.0–1.0)

## 2011-08-11 LAB — URINE MICROSCOPIC-ADD ON

## 2011-08-11 LAB — CBC
Hemoglobin: 12.2 g/dL (ref 12.0–15.0)
MCH: 29.8 pg (ref 26.0–34.0)
MCHC: 30.8 g/dL (ref 30.0–36.0)
RDW: 13.3 % (ref 11.5–15.5)

## 2011-08-11 MED ORDER — FENTANYL CITRATE 0.05 MG/ML IJ SOLN
50.0000 ug | INTRAMUSCULAR | Status: DC | PRN
  Administered 2011-08-12: 25 ug via INTRAVENOUS
  Administered 2011-08-12 – 2011-08-13 (×2): 50 ug via INTRAVENOUS
  Filled 2011-08-11 (×2): qty 2

## 2011-08-11 MED ORDER — ROCURONIUM BROMIDE 50 MG/5ML IV SOLN
50.0000 mg | Freq: Once | INTRAVENOUS | Status: AC
  Administered 2011-08-11: 50 mg via INTRAVENOUS

## 2011-08-11 MED ORDER — ALBUTEROL SULFATE (5 MG/ML) 0.5% IN NEBU
2.5000 mg | INHALATION_SOLUTION | RESPIRATORY_TRACT | Status: DC
  Administered 2011-08-11 – 2011-08-12 (×4): 2.5 mg via RESPIRATORY_TRACT
  Filled 2011-08-11 (×2): qty 0.5
  Filled 2011-08-11: qty 1
  Filled 2011-08-11: qty 0.5

## 2011-08-11 MED ORDER — MOXIFLOXACIN HCL IN NACL 400 MG/250ML IV SOLN
400.0000 mg | Freq: Once | INTRAVENOUS | Status: AC
  Administered 2011-08-11: 400 mg via INTRAVENOUS
  Filled 2011-08-11: qty 250

## 2011-08-11 MED ORDER — MIDAZOLAM HCL 2 MG/2ML IJ SOLN
INTRAMUSCULAR | Status: AC
  Administered 2011-08-11: 2 mg
  Filled 2011-08-11: qty 4

## 2011-08-11 MED ORDER — FENTANYL CITRATE 0.05 MG/ML IJ SOLN
INTRAMUSCULAR | Status: AC
  Administered 2011-08-11: 50 ug
  Filled 2011-08-11: qty 2

## 2011-08-11 MED ORDER — DEXTROSE 5 % IV SOLN
1.0000 g | Freq: Two times a day (BID) | INTRAVENOUS | Status: DC
  Administered 2011-08-12: 1 g via INTRAVENOUS
  Filled 2011-08-11 (×3): qty 10

## 2011-08-11 MED ORDER — SUCCINYLCHOLINE CHLORIDE 20 MG/ML IJ SOLN
INTRAMUSCULAR | Status: AC
  Filled 2011-08-11: qty 10

## 2011-08-11 MED ORDER — MIDAZOLAM HCL 2 MG/2ML IJ SOLN
1.0000 mg | INTRAMUSCULAR | Status: DC | PRN
  Administered 2011-08-12 (×5): 1 mg via INTRAVENOUS
  Filled 2011-08-11 (×4): qty 2

## 2011-08-11 MED ORDER — MIDAZOLAM HCL 2 MG/2ML IJ SOLN
2.0000 mg | Freq: Once | INTRAMUSCULAR | Status: AC
Start: 2011-08-11 — End: 2011-08-11
  Administered 2011-08-11: 2 mg via INTRAVENOUS

## 2011-08-11 MED ORDER — ASPIRIN 300 MG RE SUPP
300.0000 mg | RECTAL | Status: DC
  Filled 2011-08-11: qty 1

## 2011-08-11 MED ORDER — ETOMIDATE 2 MG/ML IV SOLN
INTRAVENOUS | Status: AC
  Administered 2011-08-11: 10 mg
  Filled 2011-08-11: qty 20

## 2011-08-11 MED ORDER — ENOXAPARIN SODIUM 40 MG/0.4ML ~~LOC~~ SOLN: 40.0000 mg | SUBCUTANEOUS | Status: DC

## 2011-08-11 MED ORDER — SODIUM CHLORIDE 0.9 % IV SOLN
INTRAVENOUS | Status: AC
  Administered 2011-08-12: 09:00:00 via INTRAVENOUS

## 2011-08-11 MED ORDER — IPRATROPIUM-ALBUTEROL 18-103 MCG/ACT IN AERO
6.0000 | INHALATION_SPRAY | RESPIRATORY_TRACT | Status: DC
Start: 2011-08-12 — End: 2011-08-13
  Administered 2011-08-12 – 2011-08-13 (×7): 6 via RESPIRATORY_TRACT
  Filled 2011-08-11: qty 14.7

## 2011-08-11 MED ORDER — IPRATROPIUM BROMIDE 0.02 % IN SOLN
0.5000 mg | RESPIRATORY_TRACT | Status: DC
  Administered 2011-08-11 – 2011-08-12 (×4): 0.5 mg via RESPIRATORY_TRACT
  Filled 2011-08-11 (×4): qty 2.5

## 2011-08-11 MED ORDER — PANTOPRAZOLE SODIUM 40 MG IV SOLR: 40.0000 mg | Freq: Every day | INTRAVENOUS | Status: DC

## 2011-08-11 MED ORDER — INSULIN ASPART 100 UNIT/ML ~~LOC~~ SOLN
0.0000 [IU] | Freq: Three times a day (TID) | SUBCUTANEOUS | Status: DC
  Filled 2011-08-11: qty 3

## 2011-08-11 MED ORDER — MOXIFLOXACIN HCL IN NACL 400 MG/250ML IV SOLN
400.0000 mg | Freq: Every day | INTRAVENOUS | Status: DC
  Administered 2011-08-12 – 2011-08-14 (×3): 400 mg via INTRAVENOUS
  Filled 2011-08-11 (×5): qty 250

## 2011-08-11 MED ORDER — SODIUM CHLORIDE 0.9 % IV SOLN
250.0000 mL | INTRAVENOUS | Status: DC | PRN
  Administered 2011-08-12: 250 mL via INTRAVENOUS

## 2011-08-11 MED ORDER — ROCURONIUM BROMIDE 50 MG/5ML IV SOLN
INTRAVENOUS | Status: AC
  Administered 2011-08-11: 50 mg via INTRAVENOUS
  Filled 2011-08-11: qty 2

## 2011-08-11 MED ORDER — ETOMIDATE 2 MG/ML IV SOLN
20.0000 mg | Freq: Once | INTRAVENOUS | Status: AC
  Administered 2011-08-11: 10 mg via INTRAVENOUS

## 2011-08-11 MED ORDER — LIDOCAINE HCL (CARDIAC) 20 MG/ML IV SOLN
INTRAVENOUS | Status: AC
  Filled 2011-08-11: qty 5

## 2011-08-11 MED ORDER — ASPIRIN 81 MG PO CHEW: 324.0000 mg | CHEWABLE_TABLET | ORAL | Status: DC

## 2011-08-11 MED ORDER — ACETAMINOPHEN 325 MG PO TABS
650.0000 mg | ORAL_TABLET | Freq: Once | ORAL | Status: AC
  Administered 2011-08-11: 650 mg via ORAL
  Filled 2011-08-11: qty 2

## 2011-08-11 MED ORDER — SODIUM CHLORIDE 0.9 % IV SOLN
Freq: Once | INTRAVENOUS | Status: AC
  Administered 2011-08-11: 500 mL via INTRAVENOUS

## 2011-08-11 MED ORDER — INSULIN ASPART 100 UNIT/ML ~~LOC~~ SOLN
0.0000 [IU] | Freq: Every day | SUBCUTANEOUS | Status: DC
  Administered 2011-08-12: 2 [IU] via SUBCUTANEOUS
  Filled 2011-08-11: qty 3

## 2011-08-11 MED ORDER — FENTANYL CITRATE 0.05 MG/ML IJ SOLN
50.0000 ug | Freq: Once | INTRAMUSCULAR | Status: AC
  Administered 2011-08-11: 50 ug via INTRAVENOUS

## 2011-08-11 NOTE — ED Notes (Signed)
2 mg Versed and 50 mcg Fentenyl ordered by Dr. Kendrick Fries

## 2011-08-11 NOTE — ED Notes (Signed)
Pt arrived via EMS, pt coming from home, per EMS they were called out for low O2 sats, upon arrival pt was 67% RA.

## 2011-08-11 NOTE — Procedures (Signed)
Intubation Procedure Note [name] [id number] 09-01-1931  Procedure: Intubation Indications: Respiratory insufficiency  Procedure Details Consent: Risks of procedure as well as the alternatives and risks of each were explained to the (patient/caregiver).  Consent for procedure obtained. Time Out: Verified patient identification, verified procedure, site/side was marked, verified correct patient position, special equipment/implants available, medications/allergies/relevent history reviewed, required imaging and test results available.  Performed  Drugs: etomidate 10mg , rocuronium 50mg  MAC and 3 grade I view, 7.5 ETT passed through cords on direct visualization   Evaluation Hemodynamic Status: BP stable throughout; O2 sats: stable throughout Patient's Current Condition: stable Complications: No apparent complications Patient did tolerate procedure well. Chest X-ray ordered to verify placement.  CXR: pending.   MCQUAID, DOUGLAS 08/11/2011

## 2011-08-11 NOTE — ED Notes (Signed)
Patient lives at home and is on home oxygen at [facility]lnc. Her family states that when she called her this evening she did not answer the phone and when she went to check on her she found that her oxygen was not properly hooked up last night. ems came out to the house and they placed her back on oxygen. When she came to check on her again this evening she was confused and was not responding per norm. When she was placed back on oxygen she became more coherent. Iv started pta by ems. Protocols initiated upon arrival for wheezing and shortness of breath

## 2011-08-11 NOTE — ED Notes (Signed)
3040-01 READY

## 2011-08-11 NOTE — ED Notes (Signed)
In/Out cath performed.  Pt tolerated well.  Urine output was concentrated and cloudy and malodorous

## 2011-08-11 NOTE — ED Provider Notes (Signed)
History     CSN: [id number] Arrival date & time: 08/11/2011  4:01 PM   First MD Initiated Contact with Patient 08/11/11 1617      Chief Complaint  Patient presents with  . Altered Mental Status    (Consider location/radiation/quality/duration/timing/severity/associated sxs/prior treatment) Patient is a 77 y.o. female presenting with altered mental status. The history is provided by the patient and a relative. The history is limited by the condition of the patient.  Altered Mental Status This is a new problem. The current episode started today. The problem occurs constantly. The problem has been unchanged. Associated symptoms include urinary symptoms (loss of control of urine). Pertinent negatives include no abdominal pain, chest pain, coughing, fever, vomiting or weakness. The symptoms are aggravated by nothing. She has tried nothing for the symptoms. The treatment provided no relief.    Past Medical History  Diagnosis Date  . AV block, complete   . CHF (congestive heart failure)   . COPD (chronic obstructive pulmonary disease)   . AAA (abdominal aortic aneurysm)   . Dyslipidemia   . HTN (hypertension)   . History of colonic polyps   . Anxiety and depression   . DM (diabetes mellitus)   . GERD (gastroesophageal reflux disease)   . Diverticulosis of colon   . Asthma     Past Surgical History  Procedure Date  . Tubal ligation   . Hysterectomy with baldder suspension   . Appendectomy   . Pacemaker insertion     medtronic     Family History  Problem Relation Age of Onset  . Emphysema Maternal Uncle     multiple   . Asthma Daughter   . Colon cancer Sister     alive at 90     History  Substance Use Topics  . Smoking status: Former Games developer  . Smokeless tobacco: Not on file   Comment: quit in 1990, smoked 1 ppd x 30 years   . Alcohol Use: No    OB History    Grav Para Term Preterm Abortions TAB SAB Ect Mult Living                  Review of Systems  Unable  to perform ROS: Mental status change  Constitutional: Negative for fever.  Respiratory: Negative for cough.   Cardiovascular: Negative for chest pain.  Gastrointestinal: Negative for vomiting and abdominal pain.  Neurological: Negative for weakness.  Psychiatric/Behavioral: Positive for altered mental status.    Allergies  Sulfonamide derivatives  Home Medications   Current Outpatient Rx  Name Route Sig Dispense Refill  . ALBUTEROL SULFATE HFA 108 (90 BASE) MCG/ACT IN AERS Inhalation Inhale 2 puffs into the lungs every 6 (six) hours as needed. For shortness of breath.    . ASPIRIN 81 MG PO TBEC Oral Take 81 mg by mouth daily.      Marland Kitchen VITAMIN D 2000 UNITS PO TABS Oral Take 2,000 Units by mouth daily.      . IPRATROPIUM-ALBUTEROL 0.5-2.5 (3) MG/3ML IN SOLN Nebulization Take 3 mLs by nebulization 2 (two) times daily.      Marland Kitchen MONTELUKAST SODIUM 10 MG PO TABS Oral Take 10 mg by mouth daily.      Marland Kitchen OLMESARTAN MEDOXOMIL-HCTZ 20-12.5 MG PO TABS Oral Take 1 tablet by mouth daily.      Marland Kitchen PAROXETINE HCL 20 MG PO TABS Oral Take 20 mg by mouth daily.      Marland Kitchen SIMVASTATIN 80 MG PO TABS Oral Take 40  mg by mouth. M, W F     . TIOTROPIUM BROMIDE MONOHYDRATE 18 MCG IN CAPS Inhalation Place 18 mcg into inhaler and inhale daily.        BP 153/89  Pulse 88  Temp(Src) 100.3 F (37.9 C) (Oral)  Resp 34  SpO2 94%  Physical Exam  Nursing note and vitals reviewed. Constitutional: She appears well-developed and well-nourished.  HENT:  Head: Normocephalic and atraumatic.  Eyes: Pupils are equal, round, and reactive to light.  Cardiovascular: Normal rate, regular rhythm, normal heart sounds and intact distal pulses.   Pulmonary/Chest: Effort normal. No respiratory distress. She has decreased breath sounds (diffusely). She has wheezes (mild, bilaterally).  Abdominal: Soft. She exhibits no distension. There is no tenderness.  Neurological: She is alert. She is disoriented (to location, date). No cranial  nerve deficit. GCS eye subscore is 4. GCS verbal subscore is 4. GCS motor subscore is 6.       No focal neuro deficits  Skin: Skin is warm and dry.  Psychiatric: She has a normal mood and affect.    ED Course  Procedures (including critical care time)  Labs Reviewed  CBC - Abnormal; Notable for the following:    WBC 23.3 (*)    Platelets 109 (*) PLATELET COUNT CONFIRMED BY SMEAR   All other components within normal limits  BASIC METABOLIC PANEL - Abnormal; Notable for the following:    Glucose, Bld 188 (*)    BUN 27 (*)    Creatinine, Ser 1.82 (*)    GFR calc non Af Amer 26 (*)    GFR calc Af Amer 30 (*)    All other components within normal limits  URINALYSIS, ROUTINE W REFLEX MICROSCOPIC - Abnormal; Notable for the following:    Color, Urine AMBER (*) BIOCHEMICALS MAY BE AFFECTED BY COLOR   Hgb urine dipstick TRACE (*)    Bilirubin Urine SMALL (*)    Ketones, ur 15 (*)    Protein, ur 100 (*)    All other components within normal limits  POCT I-STAT 3, BLOOD GAS (G3+) - Abnormal; Notable for the following:    pO2, Arterial 58.0 (*)    Bicarbonate 25.3 (*)    All other components within normal limits  POCT I-STAT 3, BLOOD GAS (G3+) - Abnormal; Notable for the following:    pH, Arterial 7.211 (*)    pCO2 arterial 64.0 (*)    pO2, Arterial 132.0 (*)    Bicarbonate 25.6 (*)    Acid-base deficit 3.0 (*)    All other components within normal limits  COMPREHENSIVE METABOLIC PANEL - Abnormal; Notable for the following:    Glucose, Bld 222 (*)    BUN 32 (*)    Creatinine, Ser 2.09 (*)    Calcium 8.1 (*)    Albumin 2.8 (*)    Total Bilirubin 1.5 (*)    GFR calc non Af Amer 22 (*)    GFR calc Af Amer 25 (*)    All other components within normal limits  GLUCOSE, CAPILLARY - Abnormal; Notable for the following:    Glucose-Capillary 189 (*)    All other components within normal limits  URINE MICROSCOPIC-ADD ON  LACTIC ACID, PLASMA  CULTURE, BLOOD (ROUTINE X 2)  CULTURE,  BLOOD (ROUTINE X 2)  BLOOD GAS, ARTERIAL  CBC  BASIC METABOLIC PANEL  BLOOD GAS, ARTERIAL  CARDIAC PANEL(CRET KIN+CKTOT+MB+TROPI)  CARDIAC PANEL(CRET KIN+CKTOT+MB+TROPI)  CULTURE, RESPIRATORY  STREP PNEUMONIAE URINARY ANTIGEN  LEGIONELLA ANTIGEN, URINE  POCT  RAPID INFLUENZA A&B  INFLUENZA PANEL BY PCR  POCT CBG MONITORING  BLOOD GAS, ARTERIAL  CARDIAC PANEL(CRET KIN+CKTOT+MB+TROPI)  MRSA PCR SCREENING     Dg Chest Port 1 View  08/11/2011  *RADIOLOGY REPORT*  Clinical Data: Confusion, hypoxia  PORTABLE CHEST - 1 VIEW  Comparison: 06/08/2011  Findings: Cardiomegaly again noted.  Three leads cardiac pacemaker is unchanged in position.  There is consolidation in the right upper lobe with adjacent airspace disease.  This is highly suspicious for focal pneumonia.  Loculated associated fluid cannot be excluded.  No pulmonary edema.  IMPRESSION: There is consolidation in the right upper lobe with adjacent airspace disease.  This is highly suspicious for focal pneumonia. Loculated associated fluid cannot be excluded.  No pulmonary edema.  Original Report Authenticated By: Natasha Mead, M.D.     Date: 08/11/2011  Rate: 81  Rhythm: paced  QRS Axis: indeterminate  Intervals: no prolonged QT; no PR  ST/T Wave abnormalities: difficult to determine due to paced, but obvious abnormalities  Conduction Disutrbances:paced rhythm  Narrative Interpretation: paced rhythm, no acute abnormalities  Old EKG Reviewed: unchanged (08/01/08)     No diagnosis found.    MDM  This is a 77 year old female who presents today for altered mental status. According to her daughter 2 days ago the patient had tripped over her option tubing and had disconnected it. She had become progressively confused, and when EMS arrived her sats were down to 67%. After increasing her oxygen her sats normalized her mental status improved, and the decision was made to not go to the hospital at that point in time. Since then she has  been nearly back to baseline the daughter does say she has been slightly altered. However, today she has been progressively confused, did not get out of bed on her own, and her muscle control of her urine while in the bed. Patient is alert to herself, and is able to recognize her daughter but is otherwise confused. She is febrile, with low O2 sats on her home 4 L O2 (89%). She has decreased breath sounds, and wheezing noted on exam, as well as is mildly hypoxic. There are no focal neuro deficits. Exam is otherwise unremarkable. Will check labs, including UA, chest x-ray to evaluate for the source of her confusion.   Chest x-ray is suspicious for pneumonia. Will draw blood cultures and start antibiotics for community-acquired pneumonia. Patient was restarted on her home 4 L, and was initially doing relatively well, however was slow to begin to desaturate down to the 80s so was restarted on nonrebreather. Hospitalist medicine was initially consulted for admission, however due to the low oxygen on her ABG, as well as her continually decreasing sats requiring use of nonrebreather, the decision was made to consult critical care medicine.   By the time critical care arrived, she was having worsening respiratory distress, becoming tachypneic up to the 50s, and sats down to the low 80s when her oxygen was briefly removed to switch her from a nebulizer back to nonrebreather. After discussion with the family, the decision was made to intubate the patient due to expect a further decline. Patient was admitted to the critical care team, and admitted to the ICU.     Theotis Burrow, MD 08/12/11 [phone]

## 2011-08-11 NOTE — Procedures (Signed)
Central Venous Catheter Insertion Procedure Note [name] [id number] [birth year]  Procedure: Insertion of Central Venous Catheter Indications: Assessment of intravascular volume and Drug and/or fluid administration  Procedure Details Consent: Risks of procedure as well as the alternatives and risks of each were explained to the (patient/caregiver).  Consent for procedure obtained. Time Out: Verified patient identification, verified procedure, site/side was marked, verified correct patient position, special equipment/implants available, medications/allergies/relevent history reviewed, required imaging and test results available.  Performed  Maximum sterile technique was used including antiseptics, cap, gloves, gown, hand hygiene, mask and sheet. Skin prep: Chlorhexidine; local anesthetic administered A antimicrobial bonded/coated triple lumen catheter was placed in the left internal jugular vein using the Seldinger technique.  Evaluation Blood flow good Complications: No apparent complications Patient did tolerate procedure well. Chest X-ray ordered to verify placement.  CXR: pending.  MCQUAID, DOUGLAS 08/11/2011, 9:32 PM

## 2011-08-11 NOTE — ED Notes (Signed)
PT temp 103. EDP Pickering informed.

## 2011-08-11 NOTE — ED Notes (Signed)
Tylenol administered po . PT Alert and able to follow commands. Skin hot and dry.

## 2011-08-11 NOTE — ED Notes (Addendum)
Pt intubated by DR. McQuaid. 7.5 ET inserted 24 at the lip. Color change, and bilateral breath sounds heard. Chest rise and fall equal. Charge RN in room. [place] RT in room. Gena Fray in room

## 2011-08-11 NOTE — H&P (Signed)
Name: [name] MRN: [id number] DOB: May 23, 1932  LOS: 0  CRITICAL CARE ADMISSION NOTE  History of Present Illness: 77 y/o female with COPD, CHF who presented to the [facility] ED after she was found to be confused and short of breath at home.  Her daughter notes three days of intermittent and progressive confusion, cough, chest discomfort and slight shortness of breath.  She found her today acutely dyspnic and confused.  She called 911 and had her brought here.  Lines / Drains: 08/11/11 ETT >> 08/11/11 L IJ CVL >>  Cultures / Sepsis markers: 08/11/11 blood cx >> 08/11/11 sputum cx >> 08/11/11 urine st/leg ag >> 08/11/11 rapid flu >>  Antibiotics: 08/11/11 moxifloxacin >> 08/11/11 ceftriaxone >>  Tests / Events: 08/11/11 Intubated in ED 08/11/11 CXR RUL infiltrate     Past Medical History  Diagnosis Date  . AV block, complete   . CHF (congestive heart failure)   . COPD (chronic obstructive pulmonary disease)   . AAA (abdominal aortic aneurysm)   . Dyslipidemia   . HTN (hypertension)   . History of colonic polyps   . Anxiety and depression   . DM (diabetes mellitus)   . GERD (gastroesophageal reflux disease)   . Diverticulosis of colon   . Asthma    Past Surgical History  Procedure Date  . Tubal ligation   . Hysterectomy with baldder suspension   . Appendectomy   . Pacemaker insertion     medtronic    Prior to Admission medications   Medication Sig Start Date End Date Taking? Authorizing Provider  albuterol (VENTOLIN HFA) 108 (90 BASE) MCG/ACT inhaler Inhale 2 puffs into the lungs every 6 (six) hours as needed. For shortness of breath.   Yes Historical Provider, MD  aspirin 81 MG EC tablet Take 81 mg by mouth daily.     Yes Historical Provider, MD  Cholecalciferol (VITAMIN D) 2000 UNITS tablet Take 2,000 Units by mouth daily.     Yes Historical Provider, MD  ipratropium-albuterol (DUONEB) 0.5-2.5 (3) MG/3ML SOLN Take 3 mLs by nebulization 2 (two) times daily.      Yes Historical Provider, MD  montelukast (SINGULAIR) 10 MG tablet Take 10 mg by mouth daily.     Yes Historical Provider, MD  olmesartan-hydrochlorothiazide (BENICAR HCT) 20-12.5 MG per tablet Take 1 tablet by mouth daily.     Yes Historical Provider, MD  PARoxetine (PAXIL) 20 MG tablet Take 20 mg by mouth daily.     Yes Historical Provider, MD  simvastatin (ZOCOR) 80 MG tablet Take 40 mg by mouth. M, W F    Yes Historical Provider, MD  tiotropium (SPIRIVA) 18 MCG inhalation capsule Place 18 mcg into inhaler and inhale daily.     Yes Historical Provider, MD   Allergies  Allergen Reactions  . Sulfonamide Derivatives    Family History  Problem Relation Age of Onset  . Emphysema Maternal Uncle     multiple   . Asthma Daughter   . Colon cancer Sister     alive at 44    Social History  reports that she has quit smoking. She does not have any smokeless tobacco history on file. She reports that she does not drink alcohol. Her drug history not on file.  Review Of Systems   Cannot obtain due to confusion  Vital Signs:   Filed Vitals:   08/11/11 2004 08/11/11 2025 08/11/11 2055 08/11/11 2100  BP: 108/88  128/68   Pulse:  95  Temp: 103 F (39.4 C)     TempSrc: Oral     Resp: 20 25 15 15   Height:   5\' 2"  (1.575 m)   Weight:   72.576 kg (160 lb)   SpO2:  97%      Physical Examination: Gen: acute respiratory distress HEENT: NCAT, PERRL, EOMi, MM dry  Neck: supple without masses PULM: No wheezing, poor air movement, paradoxical breathing CV: paced, no mgr, no JVD AB: BS absent, soft, nontender, no hsm Ext: warm, no edema, no clubbing, no cyanosis Derm: no rash or skin breakdown Neuro:confused, follows simple commands (raise your hand), maew Psyche: cannot assess  Labs and Imaging:  Reviewed.  Please refer to the Assessment and Plan section for relevant results.  Assessment and Plan:  79/ y/o female with acute respiratory failure due to severe community acquired  pneumonia.   Severe Community Acquired Pneumonia (08/11/2011)   Assessment:    Plan:  -ceftriaxone and moxifloxacin -f/u cultures -f/u flu/pneumonia biomarkers -see resp failure  Respiratory failure (08/11/2011)   Assessment:    Plan:  -full vent support -f/u gas -f/u cxr -nebs, see COPD  DIABETES MELLITUS (11/10/2007)   Assessment:    Plan:  -SSI and CBG  HYPERTENSION (11/10/2007)   Assessment: stable   Plan:  -monitor  AV BLOCK, COMPLETE AND AFIB(01/22/2009)   Assessment: stable, paced   Plan:  -tele monitoring  Congestive heart failure, unspecified (01/22/2009)   Assessment: does not appear wet now   Plan:  -cycle enzymes -tele -monitor volume status closely  COPD  (05/30/2008)   Assessment: not in exacerbation   Plan:  -a/a nebs scheduled on vent  GERD (11/10/2007)   Assessment:    Plan:  -ppi  Absent bowel sounds?   Assessment: unclear why, daughter says no n/v recently but poor po intake   Plan: -KUB -serial exams  Best practices /  Disposition: ICU status on PCCM  Feeding/protein malnutrition: npo Analgesia: fentanyl prn Sedation: versed prn  Thromboprophylaxis:lovenox HOB >30 degrees Ulcer prophylaxis: ppi Glucose control/hyperglycemia: ssi cbg  The patient is critically ill with multiple organ systems failure and requires high complexity decision making for assessment and support, frequent evaluation and titration of therapies, application of advanced monitoring technologies and extensive interpretation of multiple databases. Critical Care Time devoted to patient care services described in this note is 90 minutes.  Heber Upham, M.D. Pulmonary and Critical Care Medicine [facility] Pager: [phone]  08/11/2011, 9:21 PM

## 2011-08-12 ENCOUNTER — Encounter (HOSPITAL_COMMUNITY): Payer: Self-pay | Admitting: *Deleted

## 2011-08-12 ENCOUNTER — Inpatient Hospital Stay (HOSPITAL_COMMUNITY): Payer: Medicare Other

## 2011-08-12 ENCOUNTER — Other Ambulatory Visit: Payer: Self-pay

## 2011-08-12 DIAGNOSIS — N179 Acute kidney failure, unspecified: Secondary | ICD-10-CM

## 2011-08-12 DIAGNOSIS — I369 Nonrheumatic tricuspid valve disorder, unspecified: Secondary | ICD-10-CM

## 2011-08-12 DIAGNOSIS — R6521 Severe sepsis with septic shock: Secondary | ICD-10-CM

## 2011-08-12 DIAGNOSIS — J9600 Acute respiratory failure, unspecified whether with hypoxia or hypercapnia: Secondary | ICD-10-CM

## 2011-08-12 DIAGNOSIS — A419 Sepsis, unspecified organism: Secondary | ICD-10-CM

## 2011-08-12 DIAGNOSIS — J13 Pneumonia due to Streptococcus pneumoniae: Secondary | ICD-10-CM

## 2011-08-12 LAB — CARDIAC PANEL(CRET KIN+CKTOT+MB+TROPI)
CK, MB: 3.8 ng/mL (ref 0.3–4.0)
Relative Index: 2.1 (ref 0.0–2.5)
Relative Index: 3.1 — ABNORMAL HIGH (ref 0.0–2.5)
Relative Index: 2.9 — ABNORMAL HIGH (ref 0.0–2.5)
Total CK: 124 U/L (ref 7–177)
Total CK: 101 U/L (ref 7–177)
Troponin I: 0.76 ng/mL (ref <0.30)
Troponin I: 0.81 ng/mL (ref <0.30)

## 2011-08-12 LAB — HEMOGLOBIN AND HEMATOCRIT, BLOOD
HCT: 33.9 % — ABNORMAL LOW (ref 36.0–46.0)
HCT: 31.9 % — ABNORMAL LOW (ref 36.0–46.0)
Hemoglobin: 10.7 g/dL — ABNORMAL LOW (ref 12.0–15.0)
Hemoglobin: 10.2 g/dL — ABNORMAL LOW (ref 12.0–15.0)

## 2011-08-12 LAB — BASIC METABOLIC PANEL
BUN: 31 mg/dL — ABNORMAL HIGH (ref 6–23)
CO2: 22 mEq/L (ref 19–32)
Calcium: 7.7 mg/dL — ABNORMAL LOW (ref 8.4–10.5)
Chloride: 107 mEq/L (ref 96–112)
Creatinine, Ser: 1.94 mg/dL — ABNORMAL HIGH (ref 0.50–1.10)
GFR calc Af Amer: 28 mL/min — ABNORMAL LOW (ref >90)
GFR calc non Af Amer: 24 mL/min — ABNORMAL LOW (ref >90)
Glucose, Bld: 182 mg/dL — ABNORMAL HIGH (ref 70–99)
Potassium: 4.2 mEq/L (ref 3.5–5.1)
Sodium: 140 mEq/L (ref 135–145)

## 2011-08-12 LAB — LEGIONELLA ANTIGEN, URINE: Legionella Antigen, Urine: NEGATIVE

## 2011-08-12 LAB — CBC
HCT: 36.7 % (ref 36.0–46.0)
Hemoglobin: 11.5 g/dL — ABNORMAL LOW (ref 12.0–15.0)
MCH: 30.9 pg (ref 26.0–34.0)
MCHC: 31.3 g/dL (ref 30.0–36.0)
MCV: 98.7 fL (ref 78.0–100.0)
Platelets: 107 10*3/uL — ABNORMAL LOW (ref 150–400)
RBC: 3.72 MIL/uL — ABNORMAL LOW (ref 3.87–5.11)
RDW: 13.6 % (ref 11.5–15.5)
WBC: 21.2 10*3/uL — ABNORMAL HIGH (ref 4.0–10.5)

## 2011-08-12 LAB — COMPREHENSIVE METABOLIC PANEL
AST: 20 U/L (ref 0–37)
Albumin: 2.8 g/dL — ABNORMAL LOW (ref 3.5–5.2)
Calcium: 8.1 mg/dL — ABNORMAL LOW (ref 8.4–10.5)
Creatinine, Ser: 2.09 mg/dL — ABNORMAL HIGH (ref 0.50–1.10)
GFR calc non Af Amer: 22 mL/min — ABNORMAL LOW (ref >90)

## 2011-08-12 LAB — LACTIC ACID, PLASMA: Lactic Acid, Venous: 2.2 mmol/L (ref 0.5–2.2)

## 2011-08-12 LAB — POCT I-STAT 3, ART BLOOD GAS (G3+)
Acid-base deficit: 4 mmol/L — ABNORMAL HIGH (ref 0.0–2.0)
Acid-base deficit: 5 mmol/L — ABNORMAL HIGH (ref 0.0–2.0)
Bicarbonate: 21.9 meq/L (ref 20.0–24.0)
O2 Saturation: 94 %
O2 Saturation: 92 %
Patient temperature: 99.2
Patient temperature: 99.5
TCO2: 23 mmol/L (ref 0–100)
pCO2 arterial: 49.1 mmHg — ABNORMAL HIGH (ref 35.0–45.0)
pH, Arterial: 7.261 — ABNORMAL LOW (ref 7.350–7.400)
pO2, Arterial: 77 mmHg — ABNORMAL LOW (ref 80.0–100.0)

## 2011-08-12 LAB — GLUCOSE, CAPILLARY
Glucose-Capillary: 130 mg/dL — ABNORMAL HIGH (ref 70–99)
Glucose-Capillary: 145 mg/dL — ABNORMAL HIGH (ref 70–99)
Glucose-Capillary: 155 mg/dL — ABNORMAL HIGH (ref 70–99)
Glucose-Capillary: 162 mg/dL — ABNORMAL HIGH (ref 70–99)
Glucose-Capillary: 175 mg/dL — ABNORMAL HIGH (ref 70–99)
Glucose-Capillary: 189 mg/dL — ABNORMAL HIGH (ref 70–99)

## 2011-08-12 LAB — BASIC METABOLIC PANEL WITH GFR
BUN: 30 mg/dL — ABNORMAL HIGH (ref 6–23)
CO2: 21 meq/L (ref 19–32)
Calcium: 8 mg/dL — ABNORMAL LOW (ref 8.4–10.5)
Chloride: 107 meq/L (ref 96–112)
Creatinine, Ser: 1.8 mg/dL — ABNORMAL HIGH (ref 0.50–1.10)
GFR calc Af Amer: 30 mL/min — ABNORMAL LOW
GFR calc non Af Amer: 26 mL/min — ABNORMAL LOW
Glucose, Bld: 188 mg/dL — ABNORMAL HIGH (ref 70–99)
Potassium: 4.1 meq/L (ref 3.5–5.1)
Sodium: 139 meq/L (ref 135–145)

## 2011-08-12 LAB — STREP PNEUMONIAE URINARY ANTIGEN: Strep Pneumo Urinary Antigen: NEGATIVE

## 2011-08-12 LAB — MRSA PCR SCREENING: MRSA by PCR: NEGATIVE

## 2011-08-12 MED ORDER — CHLORHEXIDINE GLUCONATE 0.12 % MT SOLN
15.0000 mL | Freq: Two times a day (BID) | OROMUCOSAL | Status: DC
  Administered 2011-08-12 – 2011-08-19 (×11): 15 mL via OROMUCOSAL
  Filled 2011-08-12 (×15): qty 15

## 2011-08-12 MED ORDER — POTASSIUM CHLORIDE 10 MEQ/100ML IV SOLN
INTRAVENOUS | Status: AC
  Administered 2011-08-12: 10 meq via INTRAVENOUS
  Filled 2011-08-12: qty 400

## 2011-08-12 MED ORDER — INSULIN ASPART 100 UNIT/ML ~~LOC~~ SOLN
0.0000 [IU] | SUBCUTANEOUS | Status: DC
  Administered 2011-08-12 (×2): 3 [IU] via SUBCUTANEOUS
  Administered 2011-08-12: 2 [IU] via SUBCUTANEOUS
  Administered 2011-08-12: 3 [IU] via SUBCUTANEOUS
  Administered 2011-08-12 (×2): 2 [IU] via SUBCUTANEOUS
  Administered 2011-08-13: 5 [IU] via SUBCUTANEOUS
  Administered 2011-08-13: 3 [IU] via SUBCUTANEOUS
  Administered 2011-08-13: 2 [IU] via SUBCUTANEOUS
  Administered 2011-08-13: 5 [IU] via SUBCUTANEOUS
  Administered 2011-08-13 – 2011-08-14 (×2): 2 [IU] via SUBCUTANEOUS
  Administered 2011-08-14: 3 [IU] via SUBCUTANEOUS
  Administered 2011-08-14 (×2): 5 [IU] via SUBCUTANEOUS
  Administered 2011-08-14: 3 [IU] via SUBCUTANEOUS
  Administered 2011-08-14: 21:00:00 5 [IU] via SUBCUTANEOUS
  Administered 2011-08-15: 3 [IU] via SUBCUTANEOUS
  Administered 2011-08-15: 04:00:00 2 [IU] via SUBCUTANEOUS
  Administered 2011-08-15 (×2): 3 [IU] via SUBCUTANEOUS
  Administered 2011-08-16 (×2): 5 [IU] via SUBCUTANEOUS
  Administered 2011-08-16 (×2): 3 [IU] via SUBCUTANEOUS
  Administered 2011-08-16: 8 [IU] via SUBCUTANEOUS
  Administered 2011-08-17: 3 [IU] via SUBCUTANEOUS
  Administered 2011-08-17: 5 [IU] via SUBCUTANEOUS
  Administered 2011-08-17: 3 [IU] via SUBCUTANEOUS
  Administered 2011-08-18: 5 [IU] via SUBCUTANEOUS
  Administered 2011-08-18: 3 [IU] via SUBCUTANEOUS
  Administered 2011-08-19: 2 [IU] via SUBCUTANEOUS
  Filled 2011-08-12: qty 3

## 2011-08-12 MED ORDER — SODIUM CHLORIDE 0.9 % IV BOLUS (SEPSIS)
1000.0000 mL | Freq: Once | INTRAVENOUS | Status: AC
  Administered 2011-08-12: 1000 mL via INTRAVENOUS

## 2011-08-12 MED ORDER — BIOTENE DRY MOUTH MT LIQD
15.0000 mL | Freq: Every day | OROMUCOSAL | Status: DC
  Administered 2011-08-12 – 2011-08-13 (×10): 15 mL via OROMUCOSAL

## 2011-08-12 MED ORDER — SODIUM CHLORIDE 0.9 % IV SOLN: Freq: Once | INTRAVENOUS | Status: AC

## 2011-08-12 MED ORDER — SODIUM CHLORIDE 0.9 % IV SOLN
Freq: Once | INTRAVENOUS | Status: AC
  Administered 2011-08-12: via INTRAVENOUS

## 2011-08-12 MED ORDER — CHLORHEXIDINE GLUCONATE 0.12 % MT SOLN
OROMUCOSAL | Status: AC
  Administered 2011-08-12: 15 mL
  Filled 2011-08-12: qty 15

## 2011-08-12 MED ORDER — ACETAMINOPHEN 650 MG RE SUPP
650.0000 mg | Freq: Four times a day (QID) | RECTAL | Status: DC | PRN
  Administered 2011-08-12: 650 mg via RECTAL
  Filled 2011-08-12: qty 1

## 2011-08-12 MED ORDER — DEXTROSE 5 % IV SOLN
1.0000 g | INTRAVENOUS | Status: DC
  Administered 2011-08-12 – 2011-08-15 (×4): 1 g via INTRAVENOUS
  Filled 2011-08-12 (×6): qty 10

## 2011-08-12 MED ORDER — METOPROLOL TARTRATE 1 MG/ML IV SOLN
INTRAVENOUS | Status: AC
  Administered 2011-08-12: 5 mg
  Filled 2011-08-12: qty 5

## 2011-08-12 MED ORDER — SODIUM CHLORIDE 0.9 % IV SOLN
INTRAVENOUS | Status: DC
  Administered 2011-08-13: 01:00:00 via INTRAVENOUS

## 2011-08-12 MED ORDER — PANTOPRAZOLE SODIUM 40 MG IV SOLR
40.0000 mg | Freq: Two times a day (BID) | INTRAVENOUS | Status: DC
  Administered 2011-08-12 – 2011-08-14 (×5): 40 mg via INTRAVENOUS
  Filled 2011-08-12 (×6): qty 40

## 2011-08-12 MED ORDER — SODIUM CHLORIDE 0.9 % IV BOLUS (SEPSIS)
1000.0000 mL | INTRAVENOUS | Status: AC | PRN
  Administered 2011-08-12: 1000 mL via INTRAVENOUS

## 2011-08-12 MED ORDER — POTASSIUM CHLORIDE 10 MEQ/100ML IV SOLN
10.0000 meq | INTRAVENOUS | Status: AC
  Administered 2011-08-12 (×4): 10 meq via INTRAVENOUS

## 2011-08-12 MED ORDER — DEXTROSE 5 % IV SOLN
2.0000 ug/min | INTRAVENOUS | Status: DC
  Administered 2011-08-12: 20 ug/min via INTRAVENOUS
  Filled 2011-08-12: qty 8

## 2011-08-12 MED ORDER — ENOXAPARIN SODIUM 30 MG/0.3ML ~~LOC~~ SOLN: 30.0000 mg | SUBCUTANEOUS | Status: DC

## 2011-08-12 NOTE — Progress Notes (Signed)
Orders received for Levophed gtt to initiate

## 2011-08-12 NOTE — Progress Notes (Signed)
  Echocardiogram 2D Echocardiogram has been performed.  Katheren Puller 08/12/2011, 3:27 PM

## 2011-08-12 NOTE — Progress Notes (Signed)
eLink Physician-Brief Progress Note Patient Name: [name] DOB: [birth year] MRN: [id number]  Date of Service  08/12/2011   HPI/Events of Note  Hypotension with SBP in 68/37.  Had received sedation for intubation earlier but no sedation currently.   eICU Interventions  Plan: 1. Check CVP 2. Give 1 liter of NS now for hypotension 3. Consider pressors if BP does not improve with fluid   Intervention Category Major Interventions: Hypotension - evaluation and management  Trulee Hamstra 08/12/2011, 12:09 AM

## 2011-08-12 NOTE — Progress Notes (Signed)
1 L NS started for sustained hypotension after 5 mg IV Metoprolol given and CVP 7

## 2011-08-12 NOTE — Progress Notes (Signed)
eLink Physician-Brief Progress Note Patient Name: [name] DOB: [birth year] MRN: [id number]  Date of Service  08/12/2011   HPI/Events of Note  BP has responded to pressor support and fluid bolus.  Has CVP of 10 and per family report has not had adequate intake for 2 days. First set of cardiac enzymes are positive.  Has freq PVCs on NE gtt.   eICU Interventions  Plan: 1. Additional fluid bolus for BP support - goal CVP of 12 2. Check Mag 3. Replete potassium 4. Cycle cardiac enzymes 5. Attempt to wean NE gtt to off due to arrythmia   Intervention Category Intermediate Interventions: Hypotension - evaluation and management  DETERDING,ELIZABETH 08/12/2011, 1:25 AM

## 2011-08-12 NOTE — Procedures (Signed)
Arterial Catheter Insertion Procedure Note SHINE SCROGHAM [id number] May 28, 1932  Procedure: Insertion of Arterial Catheter  Indications: Blood pressure monitoring  Procedure Details Consent: Unable to obtain consent because of emergent medical necessity. Time Out: Verified patient identification, verified procedure, site/side was marked, verified correct patient position, special equipment/implants available, medications/allergies/relevent history reviewed, required imaging and test results available.  Performed  Maximum sterile technique was used including antiseptics, cap, gloves, gown, hand hygiene, mask and sheet. Skin prep: Chlorhexidine;  20 gauge catheter was inserted into left radial artery using the Seldinger technique.  Evaluation Blood flow good; BP tracing good. Complications: No apparent complications.   Zipporah Plants 08/12/2011

## 2011-08-12 NOTE — Progress Notes (Signed)
Dr. Craige Cotta called regarding ongoing hypotension, decreasing urine output and CVP 10. 1 L NS to infuse over 30 min

## 2011-08-12 NOTE — Progress Notes (Signed)
Name: [name] MRN: [id number] DOB: 07/01/1932  ELECTRONIC ICU PHYSICIAN NOTE  Problem:  Hypotension with cvp 6  Intervention:  NS x one liter IV  Sandrea Hughs 08/12/2011, 6:41 PM

## 2011-08-12 NOTE — ED Provider Notes (Signed)
I saw and evaluated the patient, reviewed the resident's note and I agree with the findings and plan and agree with their ECG interpretation. Shortness of breath. Pneumonia on xray. Admitted. Patients condition worsened in ED   Harrold Donath R. Rubin Payor, MD 08/12/11 1451

## 2011-08-12 NOTE — Progress Notes (Addendum)
Name: [name] MRN: [id number] DOB: May 31, 1932  LOS: 1  CRITICAL CARE ADMISSION NOTE  History of Present Illness: 77 y/o female with COPD, CHF who presented to the [facility] ED after she was found to be confused and short of breath at home.  Her daughter notes three days of intermittent and progressive confusion, cough, chest discomfort and slight shortness of breath.  She found her today acutely dyspnic and confused.  She called 911 and had her brought here.  Lines / Drains: 08/11/11 ETT >> 08/11/11 L IJ CVL >>  Cultures / Sepsis markers: 08/11/11 blood cx >> 08/11/11 sputum cx >> 08/11/11 urine st/leg ag >> 08/11/11 rapid flu >>  Antibiotics: 08/11/11 moxifloxacin >> 08/11/11 ceftriaxone >>  Tests / Events: 08/11/11 Intubated in ED 08/11/11 CXR RUL infiltrate    Subjective: I was notified by [name] to come back and evaluate [name].  Since admission to 2300, [name] has become more hypotensive than in the ED and is now on levophed, her troponin is positive and she now has what looks like bloody gastric secretions.  Vital Signs:   Filed Vitals:   08/12/11 0050 08/12/11 0100 08/12/11 0115 08/12/11 0130  BP: 151/49 115/49    Pulse: 97 62 68 96  Temp:      TempSrc:      Resp: 20 20 20 20   Height:      Weight:      SpO2: 96% 96% 97% 97%    Physical Examination: Gen: sedated on vent HEENT: NCAT, PERRL, EOMi, ETT in place, OG in place Neck: supple without masses PULM: good air movement bilaterally CV: paced, no mgr, no JVD AB: BS absent, soft, nontender, no hsm Ext: warm, no edema, no clubbing, no cyanosis Derm: no rash or skin breakdown Neuro:sedated on vent, arouses and follows commands Psyche: cannot assess  Labs and Imaging:    Troponin 0.55, Creatinine up to 2.09, lactate 2.0  KUB non-obstructive bowel gas pattern  Assessment and Plan:  77/ y/o female with acute respiratory failure due to severe community acquired pneumonia.  Now with worsening  hemodynamic status, septic, elevated troponin, worsening acute renal failure and bloody gastric secretions.   Severe Community Acquired Pneumonia (08/11/2011)   Assessment:    Plan:  -ceftriaxone and moxifloxacin -f/u cultures -f/u flu/pneumonia biomarkers -see resp failure  Elevated Troponin   Assessment: likely class 2 MI (demand ischaemia in setting of sepsis and likely bleed)   Plan: -monitor serial hcts -treat sepsis -hold on anti-coagulation and antiplatelets while monitoring possible GI bleed -EKG now -2D TTE today -tele  GI bleed?   Assessment: bloody GI aspirate, suspect source of shock is septic and hypovolemic as bleed does not appear brisk  Plan: -PPI bid -serial hct -consider GI consult if worse  Shock   Assessment: likely some component of septic and hypovolemic; outside window to start full sepsis protocol, will resuscitate while monitoring CVP's and use levophed   Plan: -CVP monitoring -volume resuscitation  Respiratory failure (08/11/2011)   Assessment:    Plan:  -full vent support -f/u gas -f/u cxr -nebs, see COPD  AKI   Assessment: likely hypovolemic/shock related   Plan -continue resuscitation -repeat BMET at 1200 today -follow uop  DIABETES MELLITUS (11/10/2007)   Assessment:    Plan:  -SSI and CBG (change to q4 hours)  AV BLOCK, COMPLETE AND AFIB(01/22/2009)   Assessment: stable, paced   Plan:  -tele monitoring  Congestive heart failure, unspecified (01/22/2009)   Assessment: does not  appear wet now   Plan:  -cycle enzymes -tele -monitor volume status closely  COPD  (05/30/2008)   Assessment: not in exacerbation   Plan:  -a/a nebs scheduled on vent  Absent bowel sounds?   Assessment: unclear why, daughter says no n/v recently but poor po intake   Plan: -KUB: normal -serial exams  Best practices /  Disposition: ICU status on PCCM  Feeding/protein malnutrition: npo Analgesia: fentanyl prn Sedation: versed prn    Thromboprophylaxis: scd HOB >30 degrees Ulcer prophylaxis: ppi Glucose control/hyperglycemia: ssi cbg  The patient is critically ill with multiple organ systems failure and requires high complexity decision making for assessment and support, frequent evaluation and titration of therapies, application of advanced monitoring technologies and extensive interpretation of multiple databases. Critical Care Time devoted to patient care services described in this note is 35 minutes.  Heber [place], M.D. Pulmonary and Critical Care Medicine [facility] Pager: [phone]  08/12/2011, 1:58 AM

## 2011-08-12 NOTE — Progress Notes (Signed)
Dr. Darrick Penna called regarding hypotension; patient has not received any sedation since intubation. 1 L NS started. CVP to be assessed after bolus of IVF. Will continue to monitor.

## 2011-08-12 NOTE — Progress Notes (Addendum)
L radial pulse bounding despite BP of 63/34 on RUE. Exchanged to LUE and BP reassessed to be significantly higher 112/58

## 2011-08-13 LAB — GLUCOSE, CAPILLARY
Glucose-Capillary: 132 mg/dL — ABNORMAL HIGH (ref 70–99)
Glucose-Capillary: 214 mg/dL — ABNORMAL HIGH (ref 70–99)
Glucose-Capillary: 218 mg/dL — ABNORMAL HIGH (ref 70–99)

## 2011-08-13 LAB — CARDIAC PANEL(CRET KIN+CKTOT+MB+TROPI)
CK, MB: 4 ng/mL (ref 0.3–4.0)
Relative Index: INVALID (ref 0.0–2.5)
Relative Index: INVALID (ref 0.0–2.5)
Total CK: 87 U/L (ref 7–177)
Troponin I: 0.35 ng/mL (ref <0.30)
Troponin I: <0.3 ng/mL (ref <0.30)

## 2011-08-13 LAB — POCT I-STAT 3, ART BLOOD GAS (G3+)
O2 Saturation: 92 %
Patient temperature: 99
pCO2 arterial: 36 mmHg (ref 35.0–45.0)

## 2011-08-13 LAB — CBC
Hemoglobin: 10.1 g/dL — ABNORMAL LOW (ref 12.0–15.0)
MCHC: 31.3 g/dL (ref 30.0–36.0)
Platelets: 98 10*3/uL — ABNORMAL LOW (ref 150–400)
RBC: 3.29 MIL/uL — ABNORMAL LOW (ref 3.87–5.11)

## 2011-08-13 LAB — BASIC METABOLIC PANEL
CO2: 19 mEq/L (ref 19–32)
GFR calc non Af Amer: 34 mL/min — ABNORMAL LOW (ref >90)
Glucose, Bld: 149 mg/dL — ABNORMAL HIGH (ref 70–99)
Potassium: 3.9 mEq/L (ref 3.5–5.1)
Sodium: 141 mEq/L (ref 135–145)

## 2011-08-13 LAB — HEMOGLOBIN AND HEMATOCRIT, BLOOD
HCT: 29.6 % — ABNORMAL LOW (ref 36.0–46.0)
Hemoglobin: 9.4 g/dL — ABNORMAL LOW (ref 12.0–15.0)

## 2011-08-13 MED ORDER — IPRATROPIUM BROMIDE 0.02 % IN SOLN: 0.5000 mg | RESPIRATORY_TRACT | Status: DC | PRN

## 2011-08-13 MED ORDER — ASPIRIN EC 81 MG PO TBEC
81.0000 mg | DELAYED_RELEASE_TABLET | Freq: Every day | ORAL | Status: DC
  Administered 2011-08-13 – 2011-08-19 (×7): 81 mg via ORAL
  Filled 2011-08-13 (×7): qty 1

## 2011-08-13 MED ORDER — ALBUTEROL SULFATE (5 MG/ML) 0.5% IN NEBU: 2.5000 mg | INHALATION_SOLUTION | RESPIRATORY_TRACT | Status: DC | PRN

## 2011-08-13 MED ORDER — ALBUTEROL SULFATE (5 MG/ML) 0.5% IN NEBU
2.5000 mg | INHALATION_SOLUTION | RESPIRATORY_TRACT | Status: DC
  Administered 2011-08-13 – 2011-08-14 (×7): 2.5 mg via RESPIRATORY_TRACT
  Filled 2011-08-13 (×7): qty 0.5

## 2011-08-13 MED ORDER — IPRATROPIUM BROMIDE 0.02 % IN SOLN
0.5000 mg | RESPIRATORY_TRACT | Status: DC
  Administered 2011-08-13 – 2011-08-14 (×7): 0.5 mg via RESPIRATORY_TRACT
  Filled 2011-08-13 (×7): qty 2.5

## 2011-08-13 MED ORDER — BIOTENE DRY MOUTH MT LIQD
15.0000 mL | OROMUCOSAL | Status: DC
  Administered 2011-08-13 (×4): 15 mL via OROMUCOSAL

## 2011-08-13 MED ORDER — SIMVASTATIN 40 MG PO TABS
40.0000 mg | ORAL_TABLET | Freq: Every day | ORAL | Status: DC
  Administered 2011-08-13 – 2011-08-15 (×3): 40 mg via ORAL
  Filled 2011-08-13 (×8): qty 1

## 2011-08-13 MED ORDER — FUROSEMIDE 10 MG/ML IJ SOLN
40.0000 mg | Freq: Every day | INTRAMUSCULAR | Status: AC
  Administered 2011-08-13 – 2011-08-14 (×2): 40 mg via INTRAVENOUS
  Filled 2011-08-13 (×2): qty 4

## 2011-08-13 MED ORDER — METHYLPREDNISOLONE SODIUM SUCC 40 MG IJ SOLR
40.0000 mg | Freq: Two times a day (BID) | INTRAMUSCULAR | Status: DC
  Administered 2011-08-13 – 2011-08-14 (×3): 40 mg via INTRAVENOUS
  Filled 2011-08-13 (×4): qty 1

## 2011-08-13 NOTE — Progress Notes (Signed)
Respiratory Therapy Extubation Procedure Note   Procedure Time out: Performed  Verified Patient ID:  yes Verified Procedure:  yes  Reason For Extubation: no further need for ventilatory support  Special Equipment Available:  no   Pre Extubation Assessment:   Placed in sitting position: yes Lungs inflated prior to tube removal:  yes Gag Reflex:moderate  Cuff Deflation Test - Able to breathe around deflated cuff: yes Pharynx Suctioned prior to cuff deflation: yes          Post Extubation Oxygen Delivery Device: 6 liters/min via Patient connected to nasal cannula oxygen  clear to auscultation, no wheezes, rales, or rhonchi   Additional Assessment:  Pt extubated to 6 L [place] per MD. Pt tol well, no distress noted at this time. Will cont to monitor.  Inocente Salles RRT,RCP 08/13/2011 9:30 AM

## 2011-08-13 NOTE — Clinical Documentation Improvement (Signed)
ACUITY OF CHF DOCUMENTATION CLARIFICATION QUERY  THIS DOCUMENT IS NOT A PERMANENT PART OF THE MEDICAL RECORD  Please update your documentation within the medical record to reflect your response to this query.                                                                                    08/13/11  Dr. Marin Shutter,  In a better effort to capture your patient's severity of illness, reflect appropriate length of stay and utilization of resources, a review of the patient medical record has revealed the following indicators regarding the diagnosis of Heart Failure.    Based on your clinical judgment, please document in the progress notes and discharge summary the ACUITY of the diastolic heart failure treated this admission:  - Acute  - Acute on Chronic  - Chronic  Clinical Information:   Progress Notes pended by Lonia Farber, MD at 08/13/11 [phone]  "Congestive heart failure, diastolic. TTE>>>EF 50-55%. -->Lasix 40 mg IV daily as now off pressors"   In responding to this query please exercise your independent judgment.  The fact that a query is asked, does not imply that any particular answer is desired or expected.   Reviewed: additional documentation in the medical record  Thank You,  Jerral Ralph  RN BSN Certified Clinical Documentation Specialist: Cell   [phone]  Health Information Management [place]  TO RESPOND TO THE THIS QUERY, FOLLOW THE INSTRUCTIONS BELOW:  1. If needed, update documentation for the patient's encounter via the notes activity.  2. Access this query again and click edit on the In [employer].  3. After updating, or not, click F2 to complete all highlighted (required) fields concerning your review. Select "additional documentation in the medical record" OR "no additional documentation provided".  4. Click Sign note button.  5. The deficiency will fall out of your In Basket *Please let us know if you are not able to  complete this workflow by phone or e-mail (listed below).

## 2011-08-13 NOTE — Plan of Care (Signed)
Problem: ICU Phase Progression Outcomes Goal: Voiding-avoid urinary catheter unless indicated Outcome: Progressing Foley indicated for diuresis

## 2011-08-13 NOTE — Progress Notes (Addendum)
Name: [name] MRN: [id number] DOB: [birth year]    LOS: 2  PCCM PROGRESS NOTE  History of Present Illness: 77 y/o female with COPD, CHF who presented to the [facility] ED after she was found to be confused and short of breath at home. Her daughter notes three days of intermittent and progressive confusion, cough, chest discomfort and slight shortness of breath. She found her today acutely dyspnic and confused. She called 911 and had her brought here.  Lines / Drains: 12/19  ETT >>>  12/19  L IJ CVL >>> 12/19  L rad A-line>>>12/21  Cultures: 12/19  BC>>>ntd 12/19  [place]>>>ntd  Antibiotics: 12/19  Moxifloxacin >>>  12/19  Ceftriaxone >>>  Tests / Events: 12/19  Intubated in ED 12/19  CXR>>>RUL infiltrate 12/20  TTE>>>EF 50-55% 12/21  Extubated  Overnight: No acute events. Weaning in 5/5  X 2.5 hours without significant distress.  Vital Signs: Temp:  [98.8 F (37.1 C)-102.3 F (39.1 C)] 99.3 F (37.4 C) (12/21 0736) Pulse Rate:  [43-106] 52  (12/21 0900) Resp:  [19-30] 21  (12/21 0900) BP: (75-127)/(37-69) 84/69 mmHg (12/21 0000) SpO2:  [93 %-100 %] 95 % (12/21 0900) Arterial Line BP: (78-143)/(34-57) 117/42 mmHg (12/21 0900) FiO2 (%):  [39.8 %-60.5 %] 39.9 % (12/21 0900) Weight:  [89.1 kg (196 lb 6.9 oz)] 196 lb 6.9 oz (89.1 kg) (12/21 0500) I/O last 3 completed shifts: In: 11914 [I.V.:3590; NG/GT:200; IV Piggyback:13770] Out: 1485 [Urine:1135; Emesis/NG output:350]  Physical Examination: General:  Somewhat anxious, but not in significant distress Neuro:  Awake, alert, oriented HEENT:  PERRL, ETT in pleace Neck:  No JVD Cardiovascular:  Paced rhythm, no murmurs Lungs:  Bilateral diminished air entry Abdomen:  Soft, nontender, bowel sounds present Musculoskeletal: No pedal edema Skin:  No rash  Ventilator settings: Vent Mode:  [-] CPAP FiO2 (%):  [39.8 %-60.5 %] 39.9 % Set Rate:  [20 bmp] 20 bmp Vt Set:  [0 mL-500 mL] 0 mL PEEP:  [4.5 cmH20-5 cmH20] 5  cmH20 Pressure Support:  [5 cmH20] 5 cmH20 Plateau Pressure:  [19 cmH20-23 cmH20] 20 cmH20  Labs and Imaging:  Reviewed.  Please refer to the Assessment and Plan section for relevant results.  Assessment and Plan:  Pneumonia (CAP).  Sputum / blood cultures negative to date.  Influenza screen is negative.  Improving WBC.  Lab 08/13/11 0440 08/12/11 0425 08/11/11 1632  WBC 13.7* 21.2* 23.3*   -->antibiotics as above  COPD, acute exacerbation.  Cor pulmonale by TTE. Baseline oxygen requirements 5 liters via cannula. -->bronchodilatros, change MDI to nebulizer -->add Solu-Medrol 40 mg IV q12h -->goal SpO2 88-92%  Congestive heart failure, diastolic, acute TTE>>>EF 50-55%. -->Lasix 40 mg IV daily x 2 days as now off pressors x 48 hours  NSTEMI vs demand ischemia.  TEE>>>inadequate images for LV motion assessment.   Lab 08/12/11 1600 08/12/11 0755 08/12/11  TROPONINI 0.81* 0.76* 0.55*   -->continue to trend enzymes to assure downward trend -->reluctant to heparinize with recent suspected GI hemorrhage on the background of shock -->start ASA 81 mg PO daily -->start Zocor 40 mg PO daily -->beta blockers are not indicated as bradycardia -->may need coronary imaging before discharge  Acute respiratory failure, resolved, weaning well on minimal settings.  Likely secondary to CHF, COPD, Pneumonia.  There are sighs of RV strain on TTE, but this is likely secondary to Cor pulmonale.  Doubt PE as multiple alternative explanations for presentation.  Lab 08/13/11 0444 08/12/11 0415 08/12/11 0118 08/11/11 2154 08/11/11  1843  PHART 7.328* 7.261* 7.304* 7.211* 7.384  PCO2ART 36.0 49.1* 45.7* 64.0* 42.4  PO2ART 69.0* 77.0* 78.0* 132.0* 58.0*  HCO3 18.9* 21.9 22.6 25.6* 25.3*  TCO2 20 23 24 28 27   O2SAT 92.0 92.0 94.0 98.0 89.0   -->extubate -->BiPAP PRN as proven benefit in recently extubated patients with COPD  Shock, septic vs hypovolemic, resolved.  Off pressors x 24  hours. -->monitor  Presumed GI hemorrhage, however hemodynamically stable, maintains Hct.  Multiple alternatives for GI aspirate to appear bloody such as possible traumatic intubation.  Lab 08/13/11 0440 08/13/11 08/12/11 1800 08/12/11 1140 08/12/11 0425  HCT 32.3* 29.6* 31.9* 33.9* 36.7   -->monitor Hct  Acute renal failure  / acute kidney injury, unknown baseline  Lab 08/13/11 0440 08/12/11 1140 08/12/11 0425 08/12/11 0010 08/11/11 1632  CREATININE 1.45* 1.80* 1.94* 2.09* 1.82*   -->lock IVF -->Lasix as above -->trend Cr  Atriall fibrillation / flutter with complete AV block -->monitor  Diabetes mellitus / hyperglycemia, good control  Lab 08/13/11 0730 08/13/11 0325 08/12/11 2342 08/12/11 1909 08/12/11 1535  GLUCAP 141* 129* 132* 145* 130*  -->SSI  Best practices / Disposition -->ICU status under PCCM -->full code -->SCDs for DVT Px -->Protonix for GI Px -->start clear liquids, advance as tolerated -->family updated at bedside  The patient is critically ill with multiple organ systems failure and requires high complexity decision making for assessment and support, frequent evaluation and titration of therapies, application of advanced monitoring technologies and extensive interpretation of multiple databases. Critical Care Time devoted to patient care services described in this note is 45 minutes.  Orlean Bradford, M.D. Pulmonary and Critical Care Medicine [facility] Cell: [phone] Pager: [phone]  08/13/2011, 9:22 AM

## 2011-08-14 ENCOUNTER — Inpatient Hospital Stay (HOSPITAL_COMMUNITY): Payer: Medicare Other

## 2011-08-14 LAB — BASIC METABOLIC PANEL
CO2: 23 mEq/L (ref 19–32)
Calcium: 8.5 mg/dL (ref 8.4–10.5)
GFR calc non Af Amer: 34 mL/min — ABNORMAL LOW (ref >90)
Glucose, Bld: 169 mg/dL — ABNORMAL HIGH (ref 70–99)
Potassium: 3.6 mEq/L (ref 3.5–5.1)
Sodium: 139 mEq/L (ref 135–145)

## 2011-08-14 LAB — CULTURE, RESPIRATORY W GRAM STAIN

## 2011-08-14 LAB — GLUCOSE, CAPILLARY
Glucose-Capillary: 163 mg/dL — ABNORMAL HIGH (ref 70–99)
Glucose-Capillary: 154 mg/dL — ABNORMAL HIGH (ref 70–99)
Glucose-Capillary: 207 mg/dL — ABNORMAL HIGH (ref 70–99)

## 2011-08-14 LAB — CBC
Hemoglobin: 9.3 g/dL — ABNORMAL LOW (ref 12.0–15.0)
MCH: 31.2 pg (ref 26.0–34.0)
Platelets: 92 10*3/uL — ABNORMAL LOW (ref 150–400)
RBC: 2.98 MIL/uL — ABNORMAL LOW (ref 3.87–5.11)

## 2011-08-14 MED ORDER — TIOTROPIUM BROMIDE MONOHYDRATE 18 MCG IN CAPS
18.0000 ug | ORAL_CAPSULE | Freq: Every day | RESPIRATORY_TRACT | Status: DC
  Administered 2011-08-14 – 2011-08-19 (×5): 18 ug via RESPIRATORY_TRACT
  Filled 2011-08-14: qty 5

## 2011-08-14 MED ORDER — MONTELUKAST SODIUM 10 MG PO TABS
10.0000 mg | ORAL_TABLET | Freq: Every day | ORAL | Status: DC
  Administered 2011-08-14 – 2011-08-19 (×6): 10 mg via ORAL
  Filled 2011-08-14 (×6): qty 1

## 2011-08-14 MED ORDER — PREDNISONE 20 MG PO TABS
20.0000 mg | ORAL_TABLET | Freq: Every day | ORAL | Status: DC
  Administered 2011-08-15 – 2011-08-19 (×5): 20 mg via ORAL
  Filled 2011-08-14 (×6): qty 1

## 2011-08-14 MED ORDER — PANTOPRAZOLE SODIUM 40 MG PO TBEC
40.0000 mg | DELAYED_RELEASE_TABLET | Freq: Every day | ORAL | Status: DC
  Administered 2011-08-15 – 2011-08-19 (×5): 40 mg via ORAL
  Filled 2011-08-14 (×5): qty 1

## 2011-08-14 MED ORDER — ALBUTEROL SULFATE (5 MG/ML) 0.5% IN NEBU: 2.5000 mg | INHALATION_SOLUTION | RESPIRATORY_TRACT | Status: DC | PRN

## 2011-08-14 MED ORDER — PAROXETINE HCL 20 MG PO TABS
20.0000 mg | ORAL_TABLET | Freq: Every day | ORAL | Status: DC
  Administered 2011-08-14 – 2011-08-19 (×6): 20 mg via ORAL
  Filled 2011-08-14 (×6): qty 1

## 2011-08-14 NOTE — Progress Notes (Signed)
Spiritual Care Note:  Pt's Pastor requested follow-up visits to pt. Visited pt, we spoke of her feeling better today, but still in a frightening state of the unknown. Met pt son, who was visiting. Prayer given.  Leonette Most Kinsly Hild Chaplain 12:11 PM  08/14/11

## 2011-08-14 NOTE — Progress Notes (Signed)
Name: [name] MRN: [id number] DOB: 01/21/1932    LOS: 3  PCCM PROGRESS NOTE  History of Present Illness: 77 y/o female with COPD, CHF who presented to the [facility] ED after she was found to be confused and short of breath at home. Her daughter notes three days of intermittent and progressive confusion, cough, chest discomfort and slight shortness of breath. She found her today acutely dyspnic and confused. She called 911 and had her brought here.  Lines / Drains: 12/19  ETT >>> 12/21 12/19  L IJ CVL >>> 12/19  L rad A-line>>>12/21  Cultures: 12/19  BC>>>ntd 12/19  [place]>>>ntd  Antibiotics: 12/19  Moxifloxacin >>>  12/19  Ceftriaxone >>>  Tests / Events: 12/19  Intubated in ED 12/19  CXR>>>RUL infiltrate 12/20  TTE>>>EF 50-55% 12/21  Extubated  Overnight: comfortable, up to chair  Vital Signs: Temp:  [97.6 F (36.4 C)-98.8 F (37.1 C)] 97.6 F (36.4 C) (12/22 1105) Pulse Rate:  [48-60] 49  (12/22 0700) Resp:  [23-31] 24  (12/22 0700) BP: (98-151)/(45-95) 142/58 mmHg (12/22 0700) SpO2:  [87 %-95 %] 93 % (12/22 1155) Weight:  [88 kg (194 lb 0.1 oz)] 194 lb 0.1 oz (88 kg) (12/22 0600) I/O last 3 completed shifts: In: 40981 [P.O.:720; I.V.:1705; NG/GT:90; IV Piggyback:10630] Out: 3225 [Urine:3225]  Physical Examination: General:  Up to chair, interacting w family Neuro:  Awake, alert, oriented HEENT:  PERRL, ETT in pleace Neck:  No JVD Cardiovascular:  Paced rhythm, no murmurs Lungs:  R crackles, no wheeze Abdomen:  Soft, nontender, bowel sounds present Musculoskeletal: No pedal edema Skin:  No rash  Ventilator settings:    Labs and Imaging:  Reviewed.  Please refer to the Assessment and Plan section for relevant results.  Assessment and Plan:  Pneumonia (CAP).  Sputum / blood cultures negative to date.  Influenza screen is negative.  Improving WBC.  Lab 08/14/11 0345 08/13/11 0440 08/12/11 0425 08/11/11 1632  WBC 8.1 13.7* 21.2* 23.3*   -->antibiotics as  above, day 4 today 12/22; consider change to PO avelox next couple days  COPD, acute exacerbation.  Cor pulmonale by TTE. Baseline oxygen requirements 5 liters via cannula. -->bronchodilators, change to her home Spiriva + albuterol -->change to pred and wean rapidly -->goal SpO2 88-92%  Congestive heart failure, diastolic. TTE>>>EF 50-55%. -->Lasix 40 mg IV daily x 2 days as now off pressors  NSTEMI vs demand ischemia.  TEE>>>inadequate images for LV motion assessment.  Lab 08/13/11 2350 08/13/11 1800 08/13/11 1210 08/12/11 1600 08/12/11 0755  TROPONINI <0.30 <0.30 0.35* 0.81* 0.76*  -->continue to trend enzymes to assure downward trend -->reluctant to heparinize with recent suspected GI hemorrhage on the background of shock -->ASA 81 mg PO daily -->Zocor 40 mg PO daily -->may need coronary imaging before discharge or as outpt  Acute respiratory failure, resolved, secondary to CHF, COPD, Pneumonia.   Lab 08/13/11 0444 08/12/11 0415 08/12/11 0118 08/11/11 2154 08/11/11 1843  PHART 7.328* 7.261* 7.304* 7.211* 7.384  PCO2ART 36.0 49.1* 45.7* 64.0* 42.4  PO2ART 69.0* 77.0* 78.0* 132.0* 58.0*  HCO3 18.9* 21.9 22.6 25.6* 25.3*  TCO2 20 23 24 28 27   O2SAT 92.0 92.0 94.0 98.0 89.0  -->extubated 12/21 -->BiPAP PRN as proven benefit in recently extubated patients with COPD -> did not require  Shock, septic vs hypovolemic, resolved.   -->monitor  Presumed GI hemorrhage, however hemodynamically stable, maintains Hct.  Multiple alternatives for GI aspirate to appear bloody such as possible traumatic intubation.  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

## 2011-08-15 LAB — GLUCOSE, CAPILLARY: Glucose-Capillary: 129 mg/dL — ABNORMAL HIGH (ref 70–99)

## 2011-08-15 LAB — BASIC METABOLIC PANEL
BUN: 35 mg/dL — ABNORMAL HIGH (ref 6–23)
Calcium: 9.1 mg/dL (ref 8.4–10.5)
GFR calc non Af Amer: 37 mL/min — ABNORMAL LOW (ref >90)
Glucose, Bld: 158 mg/dL — ABNORMAL HIGH (ref 70–99)

## 2011-08-15 MED ORDER — OLMESARTAN MEDOXOMIL-HCTZ 20-12.5 MG PO TABS: 1.0000 | ORAL_TABLET | Freq: Every day | ORAL | Status: DC

## 2011-08-15 MED ORDER — OLMESARTAN MEDOXOMIL 20 MG PO TABS
20.0000 mg | ORAL_TABLET | Freq: Every day | ORAL | Status: DC
  Administered 2011-08-15 – 2011-08-19 (×5): 20 mg via ORAL
  Filled 2011-08-15 (×5): qty 1

## 2011-08-15 MED ORDER — MOXIFLOXACIN HCL 400 MG PO TABS
400.0000 mg | ORAL_TABLET | Freq: Every day | ORAL | Status: DC
  Administered 2011-08-15 – 2011-08-18 (×4): 400 mg via ORAL
  Filled 2011-08-15 (×5): qty 1

## 2011-08-15 MED ORDER — HYDROCHLOROTHIAZIDE 12.5 MG PO CAPS
12.5000 mg | ORAL_CAPSULE | Freq: Every day | ORAL | Status: DC
  Administered 2011-08-15 – 2011-08-19 (×5): 12.5 mg via ORAL
  Filled 2011-08-15 (×5): qty 1

## 2011-08-15 NOTE — Progress Notes (Signed)
PHARMACIST - PHYSICIAN COMMUNICATION CONCERNING: Antibiotic IV to Oral Route Change Policy  RECOMMENDATION: This patient is receiving Avelox by the intravenous route.  Based on criteria approved by the Pharmacy and Therapeutics Committee, the antibiotic(s) is/are being converted to the equivalent oral dose form(s).   DESCRIPTION: These criteria include:  Patient being treated for a respiratory tract infection, urinary tract infection, or cellulitis  The patient is not neutropenic and does not exhibit a GI malabsorption state  The patient is eating (either orally or via tube) and/or has been taking other orally administered medications for a least 24 hours  The patient is improving clinically and has a Tmax < 100.5  If you have questions about this conversion, please contact the Pharmacy Department  []   [phone] )  [facility] [x]   [phone] )  [facility]  []   [phone] )  [facility] []   [phone] )  [facility]

## 2011-08-16 ENCOUNTER — Inpatient Hospital Stay (HOSPITAL_COMMUNITY): Payer: Medicare Other

## 2011-08-16 LAB — GLUCOSE, CAPILLARY: Glucose-Capillary: 218 mg/dL — ABNORMAL HIGH (ref 70–99)

## 2011-08-16 MED ORDER — POTASSIUM CHLORIDE CRYS ER 20 MEQ PO TBCR
40.0000 meq | EXTENDED_RELEASE_TABLET | Freq: Three times a day (TID) | ORAL | Status: AC
  Administered 2011-08-16 (×2): 40 meq via ORAL
  Filled 2011-08-16 (×2): qty 2

## 2011-08-16 MED ORDER — FUROSEMIDE 10 MG/ML IJ SOLN
40.0000 mg | Freq: Three times a day (TID) | INTRAMUSCULAR | Status: AC
  Administered 2011-08-17: 40 mg via INTRAVENOUS
  Filled 2011-08-16 (×3): qty 4

## 2011-08-16 NOTE — Progress Notes (Signed)
Name: [name] MRN: [id number] DOB: [birth year]    08/16/2011  THIS IS A LATE ENTRY FOR A VISIT 08/15/11  PCCM PROGRESS NOTE  History of Present Illness: 77 y/o female with COPD, CHF who presented to the [facility] ED 12/19 after she was found to be confused and short of breath at home with three days of intermittent and progressive confusion, cough, chest discomfort and slight shortness of breath. She found her today acutely dyspneic and confused. She called 911 and had her brought here.  Lines / Drains: 12/19  ETT >>> 12/21 12/19  L IJ CVL >>> 12/19  L rad A-line>>>12/21  Cultures: 12/19  BC x2 >>>  12/20  Sputum > neg  Antibiotics: 12/19  Moxifloxacin (CAP) >>>  12/19  Ceftriaxone (CAP)>>>  Tests / Events: 12/19  Intubated in ED 12/19  CXR>>>RUL infiltrate 12/20  TTE>>>EF 50-55% 12/21  Extubated  Overnight: comfortable, up to chair  Vital Signs: Afeb. vss  Physical Examination: General:  Up to chair, interacting w family Neuro:  Awake, alert, oriented HEENT:  PERRL, ETT in pleace Neck:  No JVD Cardiovascular:  Paced rhythm, no murmurs Lungs:  Minimal crackles  no wheeze Abdomen:  Soft, nontender, bowel sounds present Musculoskeletal: No pedal edema Skin:  No rash       Labs and Imaging:  Reviewed.  Please refer to the Assessment and Plan section for relevant results.  Assessment and Plan:  Pneumonia (CAP).  Sputum / blood cultures negative to date.  Influenza screen is negative.  Improving WBC.  Lab 08/14/11 0345 08/13/11 0440 08/12/11 0425 08/11/11 1632  WBC 8.1 13.7* 21.2* 23.3*   -->antibiotics as above consider change to PO avelox next couple days  COPD, acute exacerbation.  Cor pulmonale by TTE. Baseline oxygen requirements 5 liters via cannula. -->bronchodilators, change to her home Spiriva + albuterol -->change to pred and wean rapidly -->goal SpO2 88-92%  Congestive heart failure, diastolic. TTE>>>EF 50-55%. -->Lasix 40 mg IV daily x 2 days as  now off pressors   NSTEMI vs demand ischemia.  TEE>>>inadequate images for LV motion assessment.  Lab 08/13/11 2350 08/13/11 1800 08/13/11 1210 08/12/11 1600 08/12/11 0755  TROPONINI <0.30 <0.30 0.35* 0.81* 0.76*  -->continue to trend enzymes to assure downward trend -->reluctant to heparinize with recent suspected GI hemorrhage on the background of shock -->ASA 81 mg PO daily -->Zocor 40 mg PO daily -->may need coronary imaging before discharge or as outpt   Acute respiratory failure, resolved, secondary to CHF, COPD, Pneumonia.   Lab 08/13/11 0444 08/12/11 0415 08/12/11 0118 08/11/11 2154 08/11/11 1843  PHART 7.328* 7.261* 7.304* 7.211* 7.384  PCO2ART 36.0 49.1* 45.7* 64.0* 42.4  PO2ART 69.0* 77.0* 78.0* 132.0* 58.0*  HCO3 18.9* 21.9 22.6 25.6* 25.3*  TCO2 20 23 24 28 27   O2SAT 92.0 92.0 94.0 98.0 89.0  -->extubated 12/21 -->BiPAP > did not require  Shock, septic vs hypovolemic, resolved.      Presumed GI hemorrhage, however hemodynamically stable, maintains Hct.  Multiple alternatives for GI aspirate to appear bloody such as possible traumatic intubation.  Lab 08/14/11 0345 08/13/11 0440 08/13/11 08/12/11 1800 08/12/11 1140  HCT 29.3* 32.3* 29.6* 31.9* 33.9*  -->monitor Hct  Acute renal failure  / acute kidney injury, unknown baseline  Lab 08/15/11 0658 08/14/11 0345 08/13/11 0440 08/12/11 1140 08/12/11 0425  CREATININE 1.35* 1.45* 1.45* 1.80* 1.94*  -->lock IVF -->Lasix as above -->trend Cr  Atriall fibrillation / flutter with complete AV block -->monitor  Diabetes mellitus /  hyperglycemia, good control  Lab 08/16/11 0401 08/16/11 0017 08/15/11 2134 08/15/11 1812 08/15/11 1251  GLUCAP 109* 152* 165* 159* 183*  -->SSI  Best practices / Disposition -->Step down status -->full code -->SCDs for DVT Px -->Protonix for GI Px -->advance diet -->family updated at bedside  Sandrea Hughs, MD Pulmonary and Critical Care Medicine [facility]  [phone]

## 2011-08-16 NOTE — Progress Notes (Signed)
Physical Therapy Evaluation Patient Details Name: [name] MRN: [id number] DOB: 10-03-1931 Today's Date: 08/16/2011  Problem List:  Patient Active Problem List  Diagnoses  . DIABETES MELLITUS  . DYSLIPIDEMIA  . ANXIETY DEPRESSION  . HYPERTENSION  . AV BLOCK, COMPLETE  . Congestive heart failure, unspecified  . ABDOMINAL AORTIC ANEURYSM  . ASTHMA  . COPD UNSPECIFIED  . GERD  . DIVERTICULOSIS, COLON  . PERSONAL HX COLONIC POLYPS  . PACEMAKER, PERMANENT  . Respiratory failure  . Severe Community Acquired Pneumonia    Past Medical History:  Past Medical History  Diagnosis Date  . AV block, complete   . CHF (congestive heart failure)   . COPD (chronic obstructive pulmonary disease)   . AAA (abdominal aortic aneurysm)   . Dyslipidemia   . HTN (hypertension)   . History of colonic polyps   . Anxiety and depression   . DM (diabetes mellitus)   . GERD (gastroesophageal reflux disease)   . Diverticulosis of colon   . Asthma    Past Surgical History:  Past Surgical History  Procedure Date  . Tubal ligation   . Hysterectomy with baldder suspension   . Appendectomy   . Pacemaker insertion     medtronic     PT Assessment/Plan/Recommendation PT Assessment Clinical Impression Statement: Pt is a 77 y/o female admitted with SOB and PNA along with the below PT problem list.  Pt would benefit from acute PT to maximize independence and facilitate d/c home with HHPT. PT Recommendation/Assessment: Patient will need skilled PT in the acute care venue PT Problem List: Decreased strength;Decreased balance;Decreased activity tolerance;Decreased mobility;Decreased knowledge of use of DME;Decreased safety awareness Barriers to Discharge: None PT Therapy Diagnosis : Generalized weakness;Difficulty walking PT Plan PT Frequency: Min 3X/week PT Treatment/Interventions: DME instruction;Gait training;Stair training;Functional mobility training;Therapeutic activities;Balance  training;Patient/family education PT Recommendation Follow Up Recommendations: Home health PT Equipment Recommended: Rolling walker with 5" wheels PT Goals  Acute Rehab PT Goals PT Goal Formulation: With patient Time For Goal Achievement: 7 days Pt will go Supine/Side to Sit: with modified independence PT Goal: Supine/Side to Sit - Progress: Not met Pt will go Sit to Supine/Side: with modified independence PT Goal: Sit to Supine/Side - Progress: Not met Pt will go Sit to Stand: with modified independence PT Goal: Sit to Stand - Progress: Not met Pt will go Stand to Sit: with modified independence PT Goal: Stand to Sit - Progress: Not met Pt will Ambulate: >150 feet;with modified independence;with least restrictive assistive device PT Goal: Ambulate - Progress: Not met Pt will Go Up / Down Stairs: 1-2 stairs;with modified independence;with least restrictive assistive device PT Goal: Up/Down Stairs - Progress: Not met  PT Evaluation Precautions/Restrictions  Precautions Precautions: Fall (O2 Desating.) Required Braces or Orthoses: No Restrictions Weight Bearing Restrictions: No Prior Functioning  Home Living Lives With: Son Type of Home: House Home Layout: One level Home Access: Stairs to enter Entrance Stairs-Rails: None Entrance Stairs-Number of Steps: 2 Home Adaptive Equipment:  (Supplemental O2.) Prior Function Level of Independence: Independent with basic ADLs;Independent with homemaking with ambulation;Independent with gait;Independent with transfers Able to Take Stairs?: Yes Driving: No Vocation: Retired Producer, television/film/video: Awake/alert Overall Cognitive Status: Appears within functional limits for tasks assessed (However, has decreased awareness of deficits.) Orientation Level: Oriented X4 Sensation/Coordination Sensation Light Touch: Appears Intact Stereognosis: Not tested Hot/Cold: Not tested Proprioception: Not tested Coordination Gross  Motor Movements are Fluid and Coordinated: Yes Fine Motor Movements are Fluid and Coordinated: Not tested  Extremity Assessment RUE Assessment RUE Assessment: Within Functional Limits LUE Assessment LUE Assessment: Within Functional Limits RLE Assessment RLE Assessment: Within Functional Limits LLE Assessment LLE Assessment: Within Functional Limits Pain 0/10 with treatment. Mobility (including Balance) Bed Mobility Bed Mobility: Yes Supine to Sit: 4: Min assist;HOB elevated (Comment degrees) (HOB 65 degrees.) Supine to Sit Details (indicate cue type and reason): Assist for trunk secondary to fatigue with cues for sequence. Transfers Transfers: Yes Sit to Stand: 4: Min assist;With upper extremity assist;From bed Sit to Stand Details (indicate cue type and reason): Assist for balance secondary to fatigue.  Cues for hand placement and safety. Stand to Sit: 4: Min assist;With upper extremity assist;To chair/3-in-1 Stand to Sit Details: Assist for balance with cues for safety. Ambulation/Gait Ambulation/Gait: Yes Ambulation/Gait Assistance: 4: Min assist Ambulation/Gait Assistance Details (indicate cue type and reason): Assist for balance with cues for safety.  Decreased awareness of deficits. Ambulation Distance (Feet): 5 Feet (Limited by DOE.) Assistive device: 1 person hand held assist Gait Pattern: Decreased step length - right;Decreased step length - left;Trunk flexed Stairs: No Wheelchair Mobility Wheelchair Mobility: No  Posture/Postural Control Posture/Postural Control: No significant limitations Balance Balance Assessed: Yes Static Sitting Balance Static Sitting - Balance Support: Bilateral upper extremity supported;Feet supported Static Sitting - Level of Assistance: 5: Stand by assistance Static Sitting - Comment/# of Minutes: 5 End of Session PT - End of Session Equipment Utilized During Treatment: Gait belt Activity Tolerance: Patient limited by fatigue Patient  left: in chair;with call bell in reach;with family/visitor present Nurse Communication: Mobility status for transfers;Mobility status for ambulation General Behavior During Session: [facility] for tasks performed Cognition: [facility] for tasks performed  Cephus Shelling 08/16/2011, 2:50 PM  08/16/2011 Cephus Shelling, PT, DPT [phone]

## 2011-08-16 NOTE — Progress Notes (Signed)
Name: [name] MRN: [id number] DOB: 26-Oct-1931    08/16/2011  THIS IS A LATE ENTRY FOR A VISIT 08/15/11  PCCM PROGRESS NOTE  History of Present Illness: 77 y/o female with COPD, CHF who presented to the [facility] ED 12/19 after she was found to be confused and short of breath at home with three days of intermittent and progressive confusion, cough, chest discomfort and slight shortness of breath. She found her today acutely dyspneic and confused. She called 911 and had her brought here.  Lines / Drains: 12/19  ETT >>> 12/21 12/19  L IJ CVL >>> 12/19  L rad A-line>>>12/21  Cultures: 12/19  BC x2 >>>  12/20  Sputum > neg  Antibiotics: 12/19  Moxifloxacin (CAP) >>>  12/19  Ceftriaxone (CAP)>>>  Tests / Events: 12/19  Intubated in ED>>>12/21 12/19  CXR>>>RUL infiltrate 12/20  TTE>>EF 50-55%  Overnight: comfortable, up to chair  Vital Signs: Afeb. vss  Physical Examination: General:  Up to chair, interacting w family Neuro:  Awake, alert, oriented HEENT:  PERRL, ETT in pleace Neck:  No JVD Cardiovascular:  Paced rhythm, no murmurs Lungs:  Minimal crackles  no wheeze Abdomen:  Soft, nontender, bowel sounds present Musculoskeletal: No pedal edema Skin:  No rash  Labs and Imaging:  Reviewed.    BMET    Component Value Date/Time   NA 139 08/15/2011 0658   K 3.4* 08/15/2011 0658   CL 103 08/15/2011 0658   CO2 22 08/15/2011 0658   GLUCOSE 158* 08/15/2011 0658   BUN 35* 08/15/2011 0658   CREATININE 1.35* 08/15/2011 0658   CALCIUM 9.1 08/15/2011 0658   GFRNONAA 37* 08/15/2011 0658   GFRAA 43* 08/15/2011 0658   CBC    Component Value Date/Time   WBC 8.1 08/14/2011 0345   RBC 2.98* 08/14/2011 0345   HGB 9.3* 08/14/2011 0345   HCT 29.3* 08/14/2011 0345   PLT 92* 08/14/2011 0345   MCV 98.3 08/14/2011 0345   MCH 31.2 08/14/2011 0345   MCHC 31.7 08/14/2011 0345   RDW 14.1 08/14/2011 0345   LYMPHSABS 0.8 08/01/2009 1647   MONOABS 0.4 08/01/2009 1647   EOSABS 0.1  08/01/2009 1647   BASOSABS 0.0 08/01/2009 1647   Assessment and Plan:  Pneumonia (CAP).  Sputum / blood cultures negative to date.  Influenza screen is negative.  Improving WBC.  Lab 08/14/11 0345 08/13/11 0440 08/12/11 0425 08/11/11 1632  WBC 8.1 13.7* 21.2* 23.3*  - Continue PO avelox. - Chest CT without contrast for ?RUL mass.  COPD, acute exacerbation.  Cor pulmonale by TTE. Baseline oxygen requirements 5 liters via cannula. - Bronchodilators, change to her home Spiriva + albuterol. - Change to pred and wean rapidly. - Goal SpO2 88-92%.  Congestive heart failure, diastolic. TTE>>>EF 50-55%. - Lasix 40 mg IV daily x 2 days as now off pressors.  NSTEMI vs demand ischemia.  TEE>>>inadequate images for LV motion assessment.  Lab 08/13/11 2350 08/13/11 1800 08/13/11 1210 08/12/11 1600 08/12/11 0755  TROPONINI <0.30 <0.30 0.35* 0.81* 0.76*  - Enzymes normalized, no role for additional Trop checks at this point. - Reluctant to heparinize with recent suspected GI hemorrhage on the background of shock. - ASA 81 mg PO daily. - Zocor 40 mg PO daily. - Given that the troponins and EKG have not been significant enough, will have cards see as OP.  Acute respiratory failure, resolved, secondary to CHF, COPD, Pneumonia.   Lab 08/13/11 0444 08/12/11 0415 08/12/11 0118 08/11/11 2154 08/11/11 1843  PHART 7.328* 7.261* 7.304* 7.211* 7.384  PCO2ART 36.0 49.1* 45.7* 64.0* 42.4  PO2ART 69.0* 77.0* 78.0* 132.0* 58.0*  HCO3 18.9* 21.9 22.6 25.6* 25.3*  TCO2 20 23 24 28 27   O2SAT 92.0 92.0 94.0 98.0 89.0  - Extubated 12/21 - D/C BiPAP.  Shock, septic vs hypovolemic, resolved.     Presumed GI hemorrhage, however hemodynamically stable, maintains Hct.  Multiple alternatives for GI aspirate to appear bloody such as possible traumatic intubation.  Lab 08/14/11 0345 08/13/11 0440 08/13/11 08/12/11 1800 08/12/11 1140  HCT 29.3* 32.3* 29.6* 31.9* 33.9*  - Monitor Hct.  Acute renal failure  /  acute kidney injury, unknown baseline  Lab 08/15/11 0658 08/14/11 0345 08/13/11 0440 08/12/11 1140 08/12/11 0425  CREATININE 1.35* 1.45* 1.45* 1.80* 1.94*  - Lock IVF - Lasix as ordered, will not redose after that and follow Cr as it is improving with improving volume status. - Trend Cr.  Atriall fibrillation / flutter with complete AV block - Monitor.  Diabetes mellitus / hyperglycemia, good control  Lab 08/16/11 0802 08/16/11 0401 08/16/11 0017 08/15/11 2134 08/15/11 1812  GLUCAP 104* 109* 152* 165* 159*  - SSI  Best practices / Disposition - Step down status - Full code - SCDs for DVT Px - Protonix for GI Px - Advance diet  [name], M.D. [phone].

## 2011-08-17 DIAGNOSIS — J13 Pneumonia due to Streptococcus pneumoniae: Secondary | ICD-10-CM

## 2011-08-17 DIAGNOSIS — J9600 Acute respiratory failure, unspecified whether with hypoxia or hypercapnia: Secondary | ICD-10-CM

## 2011-08-17 LAB — GLUCOSE, CAPILLARY
Glucose-Capillary: 172 mg/dL — ABNORMAL HIGH (ref 70–99)
Glucose-Capillary: 246 mg/dL — ABNORMAL HIGH (ref 70–99)

## 2011-08-17 LAB — BASIC METABOLIC PANEL
CO2: 32 mEq/L (ref 19–32)
Calcium: 9 mg/dL (ref 8.4–10.5)
Chloride: 103 mEq/L (ref 96–112)
Creatinine, Ser: 1.28 mg/dL — ABNORMAL HIGH (ref 0.50–1.10)
Glucose, Bld: 112 mg/dL — ABNORMAL HIGH (ref 70–99)
Sodium: 142 mEq/L (ref 135–145)

## 2011-08-17 LAB — CBC
HCT: 37.6 % (ref 36.0–46.0)
Hemoglobin: 11.8 g/dL — ABNORMAL LOW (ref 12.0–15.0)
MCH: 30.7 pg (ref 26.0–34.0)
MCHC: 31.4 g/dL (ref 30.0–36.0)
MCV: 97.9 fL (ref 78.0–100.0)

## 2011-08-17 LAB — PHOSPHORUS: Phosphorus: 3.9 mg/dL (ref 2.3–4.6)

## 2011-08-17 LAB — MAGNESIUM: Magnesium: 2 mg/dL (ref 1.5–2.5)

## 2011-08-17 MED ORDER — SODIUM CHLORIDE 0.9 % IJ SOLN
INTRAMUSCULAR | Status: AC
  Administered 2011-08-17: 04:00:00
  Filled 2011-08-17: qty 10

## 2011-08-17 NOTE — Progress Notes (Signed)
Name: [name] MRN: [id number] DOB: 01-13-1932      PCCM PROGRESS NOTE  History of Present Illness: 77 y/o female with COPD, CHF who presented to the [facility] ED 12/19 after she was found to be confused and short of breath at home with three days of intermittent and progressive confusion, cough, chest discomfort and slight shortness of breath. She found her today acutely dyspneic and confused. She called 911 and had her brought here.  TODAY- She is alert, looking forward to getting home. Lives with son after husband died 3 months ago. She has home O2. Nurse describes marked dyspnea across this room yesterday on O2 w/ PT. PCP Dr Lacretia Nicks. Oneta Rack.  Lines / Drains: 12/19  ETT >>> 12/21 12/19  L IJ CVL >>> 12/19  L rad A-line>>>12/21  Cultures: 12/19  BC x2 >>>  12/20  Sputum > neg  Antibiotics: 12/19  Moxifloxacin (CAP) >>>  12/19  Ceftriaxone (CAP)>>>  Tests / Events: 12/19  Intubated in ED>>>12/21 12/19  CXR>>>RUL infiltrate 12/20  TTE>>EF 50-55%  Overnight: comfortable, up to chair  Vital Signs: Afeb. vss  Physical Examination: General:  In bed, oriented, NAD Neuro:  Awake, alert, oriented HEENT:  PERRL, Speech clear Neck:  No JVD, L IJ line Cardiovascular:  Paced rhythm, no murmurs Lungs:  Minimal crackles  no wheeze Abdomen:  Soft, nontender, bowel sounds present Musculoskeletal: No pedal edema Skin:  No rash  Labs and Imaging:  Reviewed.    BMET    Component Value Date/Time   NA 142 08/17/2011 0400   K 4.1 08/17/2011 0400   CL 103 08/17/2011 0400   CO2 32 08/17/2011 0400   GLUCOSE 112* 08/17/2011 0400   BUN 34* 08/17/2011 0400   CREATININE 1.28* 08/17/2011 0400   CALCIUM 9.0 08/17/2011 0400   GFRNONAA 39* 08/17/2011 0400   GFRAA 46* 08/17/2011 0400   CBC    Component Value Date/Time   WBC 15.4* 08/17/2011 0400   RBC 3.84* 08/17/2011 0400   HGB 11.8* 08/17/2011 0400   HCT 37.6 08/17/2011 0400   PLT 201 08/17/2011 0400   MCV 97.9 08/17/2011 0400   MCH  30.7 08/17/2011 0400   MCHC 31.4 08/17/2011 0400   RDW 13.9 08/17/2011 0400   LYMPHSABS 0.8 08/01/2009 1647   MONOABS 0.4 08/01/2009 1647   EOSABS 0.1 08/01/2009 1647   BASOSABS 0.0 08/01/2009 1647   CT CHEST WITHOUT CONTRAST  Technique: Multidetector CT imaging of the chest was performed  following the standard protocol without IV contrast.  Comparison: Chest radiograph 08/12/2011, CT thorax 06/14/2008  Findings: There is consolidation with air bronchograms in the right  upper lobe. There is atelectasis in the anterior right upper lobe.  Findings are most suggestive of an infectious process. No clear  nodularity or mass. There is emphysematous change within both  lungs. Within the left lower lobe there is a 6 mm pulmonary nodule  (image 28) which is unchanged from CT 2009. There are mildly  enlarged paratracheal lymph nodes. For example, 11 mm right lower  paratracheal lymph node (image 21). This is increased in size  compared to prior. There is higher right paratracheal lymph node  measuring 10 mm (image 14). No axillary adenopathy. Pacemaker  left chest.  Limited view of the upper abdomen demonstrates a cyst in the right  kidney which appears benign. Adrenal glands are normal.  Limited view of the skeleton is unremarkable.  IMPRESSION:  1. Findings most consistent right upper lobe pneumonia.  Recommend  follow-up CT in 2 months to ensure resolution.  2. Mediastinal adenopathy likely related to infection. Recommend  attention on follow-up as above.  Original Report Authenticated By: Genevive Bi, M.D.       Assessment and Plan:  Pneumonia (CAP).  Sputum / blood cultures negative to date.  Influenza screen is negative.  Improving WBC.  Lab 08/17/11 0400 08/14/11 0345 08/13/11 0440 08/12/11 0425 08/11/11 1632  WBC 15.4* 8.1 13.7* 21.2* 23.3*  - Continue PO avelox. - Chest CT reviewed. RUL pneumonia and nodes. For f/u in 2 months> PCP  COPD, acute exacerbation.  Cor  pulmonale by TTE. Baseline oxygen requirements 5 liters via cannula. - Bronchodilators, change to her home Spiriva + albuterol. Very limited exercise tolerance, --- Consider outpatient pulmonary rehab. - Change to pred and wean rapidly. - Goal SpO2 88-92%.  Congestive heart failure, diastolic. TTE>>>EF 50-55%. - Lasix 40 mg IV daily x 2 days as now off pressors.  NSTEMI vs demand ischemia.  TEE>>>inadequate images for LV motion assessment.  Lab 08/13/11 2350 08/13/11 1800 08/13/11 1210 08/12/11 1600 08/12/11 0755  TROPONINI <0.30 <0.30 0.35* 0.81* 0.76*  - Enzymes normalized, no role for additional Trop checks at this point. - Reluctant to heparinize with recent suspected GI hemorrhage on the background of shock. - ASA 81 mg PO daily. - Zocor 40 mg PO daily. - Given that the troponins and EKG have not been significant enough, will have cards see as OP.  Acute respiratory failure, resolved, secondary to CHF, COPD, Pneumonia.   Lab 08/13/11 0444 08/12/11 0415 08/12/11 0118 08/11/11 2154 08/11/11 1843  PHART 7.328* 7.261* 7.304* 7.211* 7.384  PCO2ART 36.0 49.1* 45.7* 64.0* 42.4  PO2ART 69.0* 77.0* 78.0* 132.0* 58.0*  HCO3 18.9* 21.9 22.6 25.6* 25.3*  TCO2 20 23 24 28 27   O2SAT 92.0 92.0 94.0 98.0 89.0  - Extubated 12/21 - D/C BiPAP.  Shock, septic vs hypovolemic, resolved.     Presumed GI hemorrhage, however hemodynamically stable, maintains Hct.  Multiple alternatives for GI aspirate to appear bloody such as possible traumatic intubation.  Lab 08/17/11 0400 08/14/11 0345 08/13/11 0440 08/13/11 08/12/11 1800  HCT 37.6 29.3* 32.3* 29.6* 31.9*  - Monitor Hct.  Acute renal failure  / acute kidney injury, unknown baseline  Lab 08/17/11 0400 08/15/11 0658 08/14/11 0345 08/13/11 0440 08/12/11 1140  CREATININE 1.28* 1.35* 1.45* 1.45* 1.80*  - Lock IVF - Lasix as ordered, will not redose after that and follow Cr as it is improving with improving volume status. - Trend Cr.  Atriall  fibrillation / flutter with complete AV block - Monitor.  Diabetes mellitus / hyperglycemia, good control  Lab 08/17/11 0823 08/17/11 0404 08/16/11 2200 08/16/11 1950 08/16/11 1706  GLUCAP 112* 109* 179* 218* 282*  - SSI  Best practices / Disposition - Step down status - Full code - SCDs for DVT Px - Protonix for GI Px - Advance diet  Waymon Budge, M.D. [phone].

## 2011-08-18 DIAGNOSIS — N179 Acute kidney failure, unspecified: Secondary | ICD-10-CM

## 2011-08-18 DIAGNOSIS — R6521 Severe sepsis with septic shock: Secondary | ICD-10-CM

## 2011-08-18 DIAGNOSIS — A419 Sepsis, unspecified organism: Secondary | ICD-10-CM

## 2011-08-18 LAB — CULTURE, BLOOD (ROUTINE X 2)
Culture  Setup Time: 201212200136
Culture: NO GROWTH

## 2011-08-18 LAB — GLUCOSE, CAPILLARY
Glucose-Capillary: 105 mg/dL — ABNORMAL HIGH (ref 70–99)
Glucose-Capillary: 180 mg/dL — ABNORMAL HIGH (ref 70–99)
Glucose-Capillary: 226 mg/dL — ABNORMAL HIGH (ref 70–99)
Glucose-Capillary: 229 mg/dL — ABNORMAL HIGH (ref 70–99)

## 2011-08-18 NOTE — Progress Notes (Signed)
Name: [name] MRN: [id number] DOB: Oct 26, 1931      PCCM PROGRESS NOTE  History of Present Illness: 77 y/o female with COPD, CHF who presented to the [facility] ED 12/19 after she was found to be confused and short of breath at home with three days of intermittent and progressive confusion, cough, chest discomfort and slight shortness of breath. She found her today acutely dyspneic and confused. She called 911 and had her brought here.  TODAY- She is alert, looking forward to getting home. Lives with son after husband died 3 months ago. She has home O2. Nurse describes marked dyspnea across this room yesterday on O2 w/ PT. PCP Dr Lacretia Nicks. Oneta Rack.  Lines / Drains: 12/19  ETT >>> 12/21 12/19  L IJ CVL >>> 12/19  L rad A-line>>>12/21  Cultures: 12/19  BC x2 >>>  12/20  Sputum > neg  Antibiotics: 12/19  Moxifloxacin (CAP) >>>  12/19  Ceftriaxone (CAP)>>>  Tests / Events: 12/19  Intubated in ED>>>12/21 12/19  CXR>>>RUL infiltrate 12/20  TTE>>EF 50-55%  Overnight: comfortable, up to chair  Vital Signs: Afeb. vss  Physical Examination: General:  In bed, oriented, NAD Neuro:  Awake, alert, oriented HEENT:  PERRL, Speech clear Neck:  No JVD, L IJ line Cardiovascular:  Paced rhythm, no murmurs Lungs:  Minimal crackles  no wheeze Abdomen:  Soft, nontender, bowel sounds present Musculoskeletal: No pedal edema Skin:  No rash  Labs and Imaging:  Reviewed.    BMET    Component Value Date/Time   NA 142 08/17/2011 0400   K 4.1 08/17/2011 0400   CL 103 08/17/2011 0400   CO2 32 08/17/2011 0400   GLUCOSE 112* 08/17/2011 0400   BUN 34* 08/17/2011 0400   CREATININE 1.28* 08/17/2011 0400   CALCIUM 9.0 08/17/2011 0400   GFRNONAA 39* 08/17/2011 0400   GFRAA 46* 08/17/2011 0400   CBC    Component Value Date/Time   WBC 15.4* 08/17/2011 0400   RBC 3.84* 08/17/2011 0400   HGB 11.8* 08/17/2011 0400   HCT 37.6 08/17/2011 0400   PLT 201 08/17/2011 0400   MCV 97.9 08/17/2011 0400   MCH  30.7 08/17/2011 0400   MCHC 31.4 08/17/2011 0400   RDW 13.9 08/17/2011 0400   LYMPHSABS 0.8 08/01/2009 1647   MONOABS 0.4 08/01/2009 1647   EOSABS 0.1 08/01/2009 1647   BASOSABS 0.0 08/01/2009 1647   CT CHEST WITHOUT CONTRAST  Technique: Multidetector CT imaging of the chest was performed  following the standard protocol without IV contrast.  Comparison: Chest radiograph 08/12/2011, CT thorax 06/14/2008  Findings: There is consolidation with air bronchograms in the right  upper lobe. There is atelectasis in the anterior right upper lobe.  Findings are most suggestive of an infectious process. No clear  nodularity or mass. There is emphysematous change within both  lungs. Within the left lower lobe there is a 6 mm pulmonary nodule  (image 28) which is unchanged from CT 2009. There are mildly  enlarged paratracheal lymph nodes. For example, 11 mm right lower  paratracheal lymph node (image 21). This is increased in size  compared to prior. There is higher right paratracheal lymph node  measuring 10 mm (image 14). No axillary adenopathy. Pacemaker  left chest.  Limited view of the upper abdomen demonstrates a cyst in the right  kidney which appears benign. Adrenal glands are normal.  Limited view of the skeleton is unremarkable.  IMPRESSION:  1. Findings most consistent right upper lobe pneumonia.  Recommend  follow-up CT in 2 months to ensure resolution.  2. Mediastinal adenopathy likely related to infection. Recommend  attention on follow-up as above.  Original Report Authenticated By: Genevive Bi, M.D.   Assessment and Plan:  Pneumonia (CAP).  Sputum / blood cultures negative to date.  Influenza screen is negative.   Lab 08/17/11 0400 08/14/11 0345 08/13/11 0440 08/12/11 0425 08/11/11 1632  WBC 15.4* 8.1 13.7* 21.2* 23.3*  - Continue PO avelox. - Chest CT reviewed. RUL pneumonia and nodes. For f/u in 2 months with primary.  COPD, acute exacerbation.  Cor pulmonale by TTE.  Baseline oxygen requirements 5 liters via cannula. - Bronchodilators, change to her home Spiriva + albuterol. Very limited exercise tolerance, --- Consider outpatient pulmonary rehab. - Change to pred and wean rapidly, maintain at 20 mg daily. - Goal SpO2 88-92%.  Congestive heart failure, diastolic. TTE>>>EF 50-55%. - Change lasix to 40 mg PO daily. - AM Labs.  NSTEMI vs demand ischemia.  TEE>>>inadequate images for LV motion assessment.  Lab 08/13/11 2350 08/13/11 1800 08/13/11 1210 08/12/11 1600 08/12/11 0755  TROPONINI <0.30 <0.30 0.35* 0.81* 0.76*  - Enzymes normalized, no role for additional Trop checks at this point. - Reluctant to heparinize with recent suspected GI hemorrhage on the background of shock. - ASA 81 mg PO daily. - Zocor 40 mg PO daily. - Given that the troponins and EKG have not been significant enough, will have cards see as OP.  Acute respiratory failure, resolved, secondary to CHF, COPD, Pneumonia.   Lab 08/13/11 0444 08/12/11 0415 08/12/11 0118 08/11/11 2154 08/11/11 1843  PHART 7.328* 7.261* 7.304* 7.211* 7.384  PCO2ART 36.0 49.1* 45.7* 64.0* 42.4  PO2ART 69.0* 77.0* 78.0* 132.0* 58.0*  HCO3 18.9* 21.9 22.6 25.6* 25.3*  TCO2 20 23 24 28 27   O2SAT 92.0 92.0 94.0 98.0 89.0  - Extubated 12/21 and doing well from a respiratory standpoint, down to 2L [place] (home O2 level).  Shock, septic vs hypovolemic, resolved.     Presumed GI hemorrhage, however hemodynamically stable, maintains Hct.  Multiple alternatives for GI aspirate to appear bloody such as possible traumatic intubation.  Lab 08/17/11 0400 08/14/11 0345 08/13/11 0440 08/13/11 08/12/11 1800  HCT 37.6 29.3* 32.3* 29.6* 31.9*  - Monitor Hct, stable for now.  Acute renal failure  / acute kidney injury, unknown baseline  Lab 08/17/11 0400 08/15/11 0658 08/14/11 0345 08/13/11 0440 08/12/11 1140  CREATININE 1.28* 1.35* 1.45* 1.45* 1.80*  - Lock IVF - Lasix as ordered, will not redose after that and  follow Cr as it is improving with improving volume status. - Trend Cr.  Atriall fibrillation / flutter with complete AV block - Monitor.  Diabetes mellitus / hyperglycemia, good control  Lab 08/18/11 0853 08/18/11 0457 08/18/11 0019 08/17/11 2044 08/17/11 1739  GLUCAP 105* 106* 99 159* 246*  - SSI  Patient is very irritable this AM, she is refusing PT, refusing to sit in chair.  I have no way of assessing her physical capacity to insure that d/c to home is safe for her and she is refusing to cooperate.  Her mental status is good, she is capable of making decisions for herself.  She is insisting on going home today but without knowing what she is capable of doing physically I can not ascertain her safety at home.  If she is to leave then will need to sign out AMA.  Best practices / Disposition - Step down status - Full code - SCDs for DVT Px -  Protonix for GI Px - Advance diet  Bridie Colquhoun, M.D. [phone].

## 2011-08-18 NOTE — Progress Notes (Signed)
08/18/11 09:44 PT Note: Pt adamantly refused PT this am stating, "I can walk when I am home.  I do much better there."  Educated pt on importance of mobility to build endurance/strength as well as prevent medical complications.  Pt continued to refuse saying, "I am going home today.  I will call my son, and he will come get me."  RN made aware.  Will follow as able.  08/18/2011 Cephus Shelling, PT, DPT [phone]

## 2011-08-18 NOTE — Progress Notes (Signed)
Pt. Being transferred to 6705 via wheelchair. Pt. Made aware of the transfer after having a discussion with her doctor inregards to ambulating. Phone report called to Librarian, academic.

## 2011-08-19 DIAGNOSIS — J13 Pneumonia due to Streptococcus pneumoniae: Secondary | ICD-10-CM

## 2011-08-19 DIAGNOSIS — J9600 Acute respiratory failure, unspecified whether with hypoxia or hypercapnia: Secondary | ICD-10-CM

## 2011-08-19 DIAGNOSIS — N179 Acute kidney failure, unspecified: Secondary | ICD-10-CM

## 2011-08-19 DIAGNOSIS — A419 Sepsis, unspecified organism: Secondary | ICD-10-CM

## 2011-08-19 DIAGNOSIS — R6521 Severe sepsis with septic shock: Secondary | ICD-10-CM

## 2011-08-19 LAB — CBC
HCT: 38.5 % (ref 36.0–46.0)
Hemoglobin: 12.1 g/dL (ref 12.0–15.0)
MCH: 30.6 pg (ref 26.0–34.0)
MCHC: 31.4 g/dL (ref 30.0–36.0)
MCV: 97.2 fL (ref 78.0–100.0)

## 2011-08-19 LAB — BASIC METABOLIC PANEL
BUN: 27 mg/dL — ABNORMAL HIGH (ref 6–23)
Creatinine, Ser: 1.16 mg/dL — ABNORMAL HIGH (ref 0.50–1.10)
GFR calc non Af Amer: 44 mL/min — ABNORMAL LOW (ref >90)
Glucose, Bld: 89 mg/dL (ref 70–99)
Potassium: 3.6 mEq/L (ref 3.5–5.1)

## 2011-08-19 LAB — GLUCOSE, CAPILLARY
Glucose-Capillary: 110 mg/dL — ABNORMAL HIGH (ref 70–99)
Glucose-Capillary: 127 mg/dL — ABNORMAL HIGH (ref 70–99)
Glucose-Capillary: 89 mg/dL (ref 70–99)
Glucose-Capillary: 134 mg/dL — ABNORMAL HIGH (ref 70–99)

## 2011-08-19 MED ORDER — PREDNISONE 20 MG PO TABS: ORAL_TABLET | ORAL | Status: DC

## 2011-08-19 MED ORDER — AMOXICILLIN-POT CLAVULANATE 875-125 MG PO TABS: 1.0000 | ORAL_TABLET | Freq: Two times a day (BID) | ORAL | Status: AC

## 2011-08-19 NOTE — Discharge Summary (Signed)
Physician Discharge Summary  Patient ID: [name] MRN: [id number] DOB/AGE: [birth year] 75 y.o.  Admit date: 08/11/2011 Discharge date: 08/19/2011  PCP:  Dr. Oneta Rack Cards: Dr. Ladona Ridgel  Discharge Diagnoses:  Principal Problem:  *Severe Community Acquired Pneumonia Active Problems:  DIABETES MELLITUS  HYPERTENSION  AV BLOCK, COMPLETE  Congestive heart failure, unspecified  COPD UNSPECIFIED  GERD  Respiratory failure    Brief Summary: [name] is a 77 y.o. y/o female with a PMH of O2 dependent COPD (5L), CHF, HTN, Afib, pacemaker, GERD who presented to the [facility] ED 12/19 after she was found to be confused and short of breath at home with three days of intermittent and progressive confusion, cough, chest discomfort and slight shortness of breath. Family found her 12/19 acutely dyspneic and confused. EMS was activated and tx to [facility] ED.  Evaluation demonstrated right upper lobe infiltrate on CXR.  She was pan cultured, placed on antibiotics for CAP coverage to include Avelox & rocephin.  She unfortunately decompensated and required intubation in ER.  She remained on mechanical ventilation until 08/13/11 at which time she was successfully extubated to [place] O2.  Cultures negative during hospitalization, flu screen negative.  CT of the chest was evaluated to further examine RUL which demonstrated findings consistent with pneumonia (see results below).  Also noted, LLL 6mm pulmonary nodule that was unchanged from previous study in 2009.  Initially, [name] was noted to be hypotensive which responded to volume resuscitation and vasopressors.  Cardiac enzymes were cycled and were noted to be 0.55-->0.81 -consistent with demand ischemia.  TEE was performed but images were inadequate for LV motion assessment, EF 50-55% and evidence of right heart strain (thought secondary to cor pulmonale).  She was diuresed intermittently once weaned off vasopressors.  She was noted to have hyperglycemia in  setting of steroid use.  Acute renal failure was noted during shock state and resolved with treatment of PNA and supportive care. She continued to improve and was transitioned out of ICU.  Prior to discharge she was able to ambulate without assistance and had minimal dyspnea with approximately 50 feet.  Hospital Course by Discharge Diagnosis  Severe Community Acquired Pneumonia / COPD / Acute Respiratory Failure / Cor Pulmonale [name] is a 77 y.o. y/o female with a PMH of O2 dependent COPD (5L), CHF, HTN, Afib, pacemaker, GERD who presented to the [facility] ED 12/19 after she was found to be confused and short of breath at home with three days of intermittent and progressive confusion, cough, chest discomfort and slight shortness of breath. Family found her 12/19 acutely dyspneic and confused. EMS was activated and tx to [facility] ED.  Evaluation demonstrated right upper lobe infiltrate on CXR.  She was pan cultured, placed on antibiotics for CAP coverage to include Avelox & rocephin.  She unfortunately decompensated and required intubation in ER.  She remained on mechanical ventilation until 08/13/11 at which time she was successfully extubated to [place] O2. Cultures negative during hospitalization, flu screen negative.  CT of the chest was evaluated to further examine RUL which demonstrated findings consistent with pneumonia (see results below).  Also noted, LLL 6mm pulmonary nodule that was unchanged from previous study in 2009.  Recommend follow up in 3-6 months regarding resolution of RUL PNA and LLL pulmonary nodule.    HTN / AV Block / Atrial flutter / CHF / NSTEMI  Initially, [name] was noted to be hypotensive which responded to volume resuscitation and vasopressors.  Cardiac  enzymes were cycled and were noted to be 0.55-->0.81 -consistent with demand ischemia.  TEE was performed but images were inadequate for LV motion assessment, EF 50-55% and evidence of right heart strain (thought secondary to cor  pulmonale).  She was diuresed intermittently once weaned off vasopressors.  Hyperglycemia / ?DM She was noted to have hyperglycemia in setting of steroid use.  Blood sugars should normalize after completion of steroid taper.  Recommend follow up glucose screening as outpatient.  GERD [name] has a history of GERD and was maintained on PPI during hospitalization and will continue on previous home dosing.  Acute Renal Failure Acute renal failure was noted during shock state and resolved with treatment of PNA and supportive care.   UTI Subjective symptoms at time of discharge.  Patient insistent on going home.  Will treat with abx and given instructions to call PCP if any increase in symptoms or to report to [facility] ED.  Pt indicates verbal understanding.  Specifically instructed pt to return if fevers, chills, n/v, worsening of symptoms, or change in mental status.     Lines / Drains:  12/19 ETT >>> 12/21  12/19 L IJ CVL >>>12/25 12/19 L rad A-line>>>12/21   Cultures:  12/19 BC x2 >>>neg 12/20 Sputum > neg  12/20 MRSA PCR>>>neg  Antibiotics:  12/19 Moxifloxacin (CAP) >>>12/27 12/19 Ceftriaxone (CAP)>>>12/24  Tests / Events:  12/19 Intubated in ED>>>12/21  12/19 CXR>>>RUL infiltrate  12/20 TTE>>EF 50-55% 12/24 CT Chest>>>There is consolidation with air bronchograms in the right upper lobe. There is atelectasis in the anterior right upper lobe. Findings are most suggestive of an infectious process. No clear nodularity or mass. There is emphysematous change within both lungs. Within the left lower lobe there is a 6 mm pulmonary nodule (image 28) which is unchanged from CT 2009. There are mildly enlarged paratracheal lymph nodes. For example, 11 mm right lower paratracheal lymph node (image 21). This is increased in size compared to prior. There is higher right paratracheal lymph node measuring 10 mm (image 14). No axillary adenopathy. Pacemaker left chest.    Discharge  Exam: General:chronically ill elderly female in NAD Neuro: AAOx4, speech clear, MAE CV: s1s2 distant, irregular PULM: resp's even/non-labored, lungs bilaterally diminished WU:[id number] / soft, bsx4 active Extremities: warm/dry, no edema  Pt ambulated by NP prior to discharge.   She is able to ambulate without assistance and tolerated approximately 50 feet with minimal dyspnea.     Discharge Labs BMET    Component Value Date/Time   NA 142 08/19/2011 0620   K 3.6 08/19/2011 0620   CL 99 08/19/2011 0620   CO2 35* 08/19/2011 0620   GLUCOSE 89 08/19/2011 0620   BUN 27* 08/19/2011 0620   CREATININE 1.16* 08/19/2011 0620   CALCIUM 8.7 08/19/2011 0620   GFRNONAA 44* 08/19/2011 0620   GFRAA 51* 08/19/2011 0620   Lab Results  Component Value Date   WBC 12.7* 08/19/2011   HGB 12.1 08/19/2011   HCT 38.5 08/19/2011   MCV 97.2 08/19/2011   PLT 204 08/19/2011      Discharge Orders    Future Appointments: Provider: Department: Dept Phone: Center:   10/04/2011 11:00 AM Lewayne Bunting, MD [facility] [id number]     Future Orders Please Complete By Expires   Diet - low sodium heart healthy      Increase activity slowly      Nursing communication      Comments:   Review discharge instructions with patient.  Pt will need her son to bring home O2 tank for discharge.       [name], [name]  Home Medication Instructions QIO:[id number]   Printed on:08/19/11 0945  Medication Information                    aspirin 81 MG EC tablet Take 81 mg by mouth daily.             olmesartan-hydrochlorothiazide (BENICAR HCT) 20-12.5 MG per tablet Take 1 tablet by mouth daily.             ipratropium-albuterol (DUONEB) 0.5-2.5 (3) MG/3ML SOLN Take 3 mLs by nebulization 2 (two) times daily.             PARoxetine (PAXIL) 20 MG tablet Take 20 mg by mouth daily.             simvastatin (ZOCOR) 80 MG tablet Take 40 mg by mouth. M, W F            montelukast (SINGULAIR) 10 MG  tablet Take 10 mg by mouth daily.             tiotropium (SPIRIVA) 18 MCG inhalation capsule Place 18 mcg into inhaler and inhale daily.             albuterol (VENTOLIN HFA) 108 (90 BASE) MCG/ACT inhaler Inhale 2 puffs into the lungs every 6 (six) hours as needed. For shortness of breath.           Cholecalciferol (VITAMIN D) 2000 UNITS tablet Take 2,000 Units by mouth daily.             predniSONE (DELTASONE) 20 MG tablet 1 tab daily for 6 days, then 1/2 tab daily for 6 days then stop              Follow-up Information    Follow up with Nadean Corwin, MD on 08/26/2011. (at 2:30 PM)    Contact information:   [phone]      Follow up with Lewayne Bunting, MD on 10/04/2011. (11 am)    Contact information:   [address] [place] [address] [phone]          Disposition: Home or Self Care Discharged Condition: [name] has met maximum benefit of inpatient care and is medically stable and cleared for discharge.  Patient is pending follow up as above.      Time spent on disposition:  Greater than 35 minutes.   Signed: Canary Brim, NP-C [facility] Pgr: [phone]    Patient seen and examined, agree with above note.  I dictated the care and orders written for this patient under my direction.  Koren Bound, M.D.

## 2011-09-30 ENCOUNTER — Encounter: Payer: Self-pay | Admitting: Internal Medicine

## 2011-10-04 ENCOUNTER — Encounter: Payer: Self-pay | Admitting: Internal Medicine

## 2011-10-04 ENCOUNTER — Ambulatory Visit (INDEPENDENT_AMBULATORY_CARE_PROVIDER_SITE_OTHER): Payer: Medicare Other | Admitting: Internal Medicine

## 2011-10-04 DIAGNOSIS — Z950 Presence of cardiac pacemaker: Secondary | ICD-10-CM

## 2011-10-04 DIAGNOSIS — R0602 Shortness of breath: Secondary | ICD-10-CM

## 2011-10-04 DIAGNOSIS — I442 Atrioventricular block, complete: Secondary | ICD-10-CM

## 2011-10-04 LAB — PACEMAKER DEVICE OBSERVATION
AL AMPLITUDE: 2.8 mv
AL IMPEDENCE PM: 415 Ohm
BAMS-0001: 150 {beats}/min
BATTERY VOLTAGE: 3.001 V
RV LEAD IMPEDENCE PM: 557 Ohm
VENTRICULAR PACING PM: 99

## 2011-10-04 MED ORDER — POTASSIUM CHLORIDE CRYS ER 20 MEQ PO TBCR: EXTENDED_RELEASE_TABLET | ORAL | Status: DC

## 2011-10-04 MED ORDER — FUROSEMIDE 40 MG PO TABS: 40.0000 mg | ORAL_TABLET | Freq: Every day | ORAL | Status: DC

## 2011-10-04 NOTE — Assessment & Plan Note (Signed)
I suspect that the dyspnea associated with this is multifactorial. She is on oxygen. We will try additional diuretic therapy today in hopes of improving her symptoms. She will return next week for labs.

## 2011-10-04 NOTE — Assessment & Plan Note (Signed)
I have asked the patient to maintain a low-sodium diet. She will continue her current medical therapy.

## 2011-10-04 NOTE — Progress Notes (Signed)
HPI [name] returns today for followup. She is a pleasant 78 year old woman with a history of COPD, mixed chronic systolic heart failure, complete heart block, atrial fibrillation, status post biventricular pacemaker insertion. In the interim, she has been hospitalized with pneumonia. Today she complains of dyspnea. Minimal peripheral edema. No chest pain, fevers, or chills. Allergies  Allergen Reactions  . Sulfonamide Derivatives      Current Outpatient Prescriptions  Medication Sig Dispense Refill  . albuterol (VENTOLIN HFA) 108 (90 BASE) MCG/ACT inhaler Inhale 2 puffs into the lungs every 6 (six) hours as needed. For shortness of breath.      Marland Kitchen aspirin 81 MG EC tablet Take 81 mg by mouth daily.        . Cholecalciferol (VITAMIN D) 2000 UNITS tablet Take 2,000 Units by mouth daily.        Marland Kitchen ipratropium-albuterol (DUONEB) 0.5-2.5 (3) MG/3ML SOLN Take 3 mLs by nebulization 2 (two) times daily.        . montelukast (SINGULAIR) 10 MG tablet Take 10 mg by mouth daily.        Marland Kitchen olmesartan-hydrochlorothiazide (BENICAR HCT) 20-12.5 MG per tablet Take 1 tablet by mouth daily.        Marland Kitchen PARoxetine (PAXIL) 20 MG tablet Take 20 mg by mouth daily.        . simvastatin (ZOCOR) 80 MG tablet Take 40 mg by mouth. M, W F       . tiotropium (SPIRIVA) 18 MCG inhalation capsule Place 18 mcg into inhaler and inhale daily.           Past Medical History  Diagnosis Date  . AV block, complete   . CHF (congestive heart failure)   . COPD (chronic obstructive pulmonary disease)   . AAA (abdominal aortic aneurysm)   . Dyslipidemia   . HTN (hypertension)   . History of colonic polyps   . Anxiety and depression   . DM (diabetes mellitus)   . GERD (gastroesophageal reflux disease)   . Diverticulosis of colon   . Asthma   . Acute renal failure   . Atrial flutter   . Hyperglycemia   . UTI (lower urinary tract infection)     ROS:   All systems reviewed and negative except as noted in the HPI.   Past  Surgical History  Procedure Date  . Tubal ligation   . Hysterectomy with baldder suspension   . Appendectomy   . Pacemaker insertion     medtronic      Family History  Problem Relation Age of Onset  . Emphysema Maternal Uncle     multiple   . Asthma Daughter   . Colon cancer Sister     alive at 38      History   Social History  . Marital Status: Widowed    Spouse Name: N/A    Number of Children: N/A  . Years of Education: N/A   Occupational History  . Not on file.   Social History Main Topics  . Smoking status: Former Games developer  . Smokeless tobacco: Not on file   Comment: quit in 1990, smoked 1 ppd x 30 years   . Alcohol Use: No  . Drug Use: Not on file  . Sexually Active: Not on file   Other Topics Concern  . Not on file   Social History Narrative   Married, 4 children; retired Associate Professor.      BP 116/64  Pulse 92  Ht 5\' 3"  (1.6  m)  Wt 83.462 kg (184 lb)  BMI 32.59 kg/m2  Physical Exam:  Well appearing elderly woman, NAD HEENT: Unremarkable Neck:  7 cm JVD, no thyromegally Lungs:  Clear with no wheezes, rales, or rhonchi. HEART:  Regular rate rhythm, no murmurs, no rubs, no clicks Abd:  soft, positive bowel sounds, no organomegally, no rebound, no guarding Ext:  2 plus pulses, no edema, no cyanosis, no clubbing Skin:  No rashes no nodules Neuro:  CN II through XII intact, motor grossly intact   DEVICE  Normal device function.  See PaceArt for details.   Assess/Plan:

## 2011-10-04 NOTE — Assessment & Plan Note (Signed)
Her left ventricular lead has been turned off secondary to diaphragmatic stimulation. Her right ventricular lead is working normally.

## 2011-10-04 NOTE — Patient Instructions (Addendum)
Your physician has recommended you make the following change in your medication:  1) Start lasix (furosemide) 40 mg one tablet by mouth daily. 2) Start potassium (klor-con) 20 meq one tablet by mouth daily.  Your physician recommends that you return for lab work in: 1 week- bmp/bnp (786.05)  Your physician wants you to follow-up in: 6 months with device clinic.  You will receive a reminder letter in the mail two months in advance. If you don't receive a letter, please call our office to schedule the follow-up appointment.

## 2011-10-11 ENCOUNTER — Ambulatory Visit (INDEPENDENT_AMBULATORY_CARE_PROVIDER_SITE_OTHER): Payer: Medicare Other | Admitting: *Deleted

## 2011-10-11 DIAGNOSIS — R0602 Shortness of breath: Secondary | ICD-10-CM

## 2011-10-11 LAB — BASIC METABOLIC PANEL
GFR: 41.38 mL/min — ABNORMAL LOW (ref >60.00)
Glucose, Bld: 140 mg/dL — ABNORMAL HIGH (ref 70–99)
Potassium: 3.6 mEq/L (ref 3.5–5.1)
Sodium: 141 mEq/L (ref 135–145)

## 2011-10-13 LAB — BRAIN NATRIURETIC PEPTIDE: Pro B Natriuretic peptide (BNP): 51 pg/mL (ref 0.0–100.0)

## 2011-10-13 LAB — BASIC METABOLIC PANEL
BUN: 25 mg/dL — ABNORMAL HIGH (ref 6–23)
Chloride: 99 mEq/L (ref 96–112)
Potassium: 3.4 mEq/L — ABNORMAL LOW (ref 3.5–5.1)

## 2012-05-04 ENCOUNTER — Ambulatory Visit (INDEPENDENT_AMBULATORY_CARE_PROVIDER_SITE_OTHER): Payer: Medicare Other | Admitting: *Deleted

## 2012-05-04 DIAGNOSIS — I442 Atrioventricular block, complete: Secondary | ICD-10-CM

## 2012-05-04 LAB — PACEMAKER DEVICE OBSERVATION
AL THRESHOLD: 1 V
BAMS-0001: 150 {beats}/min
RV LEAD AMPLITUDE: 11.2 mv
RV LEAD THRESHOLD: 0.5 V

## 2012-05-04 NOTE — Progress Notes (Signed)
PPM check 

## 2012-06-16 ENCOUNTER — Encounter: Payer: Self-pay | Admitting: Internal Medicine

## 2012-06-21 ENCOUNTER — Other Ambulatory Visit (HOSPITAL_COMMUNITY): Payer: Self-pay | Admitting: Internal Medicine

## 2012-06-21 DIAGNOSIS — Z1231 Encounter for screening mammogram for malignant neoplasm of breast: Secondary | ICD-10-CM

## 2012-07-11 ENCOUNTER — Other Ambulatory Visit: Payer: Self-pay | Admitting: Internal Medicine

## 2012-07-14 ENCOUNTER — Ambulatory Visit (HOSPITAL_COMMUNITY)
Admission: RE | Admit: 2012-07-14 | Discharge: 2012-07-14 | Disposition: A | Discharge status: Home / Self Care | Payer: Medicare Other | Source: Ambulatory Visit | Attending: Internal Medicine | Admitting: Internal Medicine

## 2012-07-14 DIAGNOSIS — Z1231 Encounter for screening mammogram for malignant neoplasm of breast: Secondary | ICD-10-CM | POA: Insufficient documentation

## 2012-08-02 ENCOUNTER — Encounter: Payer: Self-pay | Admitting: *Deleted

## 2012-08-08 ENCOUNTER — Ambulatory Visit (INDEPENDENT_AMBULATORY_CARE_PROVIDER_SITE_OTHER): Payer: Medicare Other | Admitting: Internal Medicine

## 2012-08-08 ENCOUNTER — Encounter: Payer: Self-pay | Admitting: Internal Medicine

## 2012-08-08 VITALS — BP 150/75 | HR 85 | Ht 63.0 in | Wt 191.4 lb

## 2012-08-08 DIAGNOSIS — J449 Chronic obstructive pulmonary disease, unspecified: Secondary | ICD-10-CM

## 2012-08-08 DIAGNOSIS — Z950 Presence of cardiac pacemaker: Secondary | ICD-10-CM

## 2012-08-08 DIAGNOSIS — I442 Atrioventricular block, complete: Secondary | ICD-10-CM

## 2012-08-08 DIAGNOSIS — I509 Heart failure, unspecified: Secondary | ICD-10-CM

## 2012-08-08 LAB — PACEMAKER DEVICE OBSERVATION
AL AMPLITUDE: 2.8 mv
BATTERY VOLTAGE: 2.995 V
RV LEAD IMPEDENCE PM: 587 Ohm
VENTRICULAR PACING PM: 98

## 2012-08-08 NOTE — Progress Notes (Signed)
HPI [name] returns today for followup. She is a pleasant and 78 year old woman with a history of chronic systolic heart failure, complete heart block, hypertension, and COPD on home oxygen. In the interim, she has been stable but does note a chronic cough the last couple days. Perhaps some increased shortness of breath, but no peripheral edema. No syncope. Her left ventricular lead was turned off secondary to diaphragmatic stimulation. Allergies  Allergen Reactions  . Sulfonamide Derivatives      Current Outpatient Prescriptions  Medication Sig Dispense Refill  . albuterol (VENTOLIN HFA) 108 (90 BASE) MCG/ACT inhaler Inhale 2 puffs into the lungs every 6 (six) hours as needed. For shortness of breath.      Marland Kitchen aspirin 81 MG EC tablet Take 81 mg by mouth daily.        . Cholecalciferol (VITAMIN D) 2000 UNITS tablet Take 1,000 Units by mouth daily.       Marland Kitchen esomeprazole (NEXIUM) 40 MG capsule Take 40 mg by mouth daily before breakfast.      . furosemide (LASIX) 40 MG tablet TAKE ONE TABLET BY MOUTH ONE TIME DAILY  30 tablet  5  . ipratropium-albuterol (DUONEB) 0.5-2.5 (3) MG/3ML SOLN Take 3 mLs by nebulization 2 (two) times daily.        . montelukast (SINGULAIR) 10 MG tablet Take 10 mg by mouth daily.        Marland Kitchen olmesartan-hydrochlorothiazide (BENICAR HCT) 20-12.5 MG per tablet Take 1 tablet by mouth daily.        Marland Kitchen PARoxetine (PAXIL) 20 MG tablet Take 20 mg by mouth daily.       . simvastatin (ZOCOR) 80 MG tablet Take 40 mg by mouth. M, W F       . tiotropium (SPIRIVA) 18 MCG inhalation capsule Place 18 mcg into inhaler and inhale daily.        . vitamin B-12 (CYANOCOBALAMIN) 1000 MCG tablet Take 1,000 mcg by mouth daily.         Past Medical History  Diagnosis Date  . AV block, complete   . CHF (congestive heart failure)   . COPD (chronic obstructive pulmonary disease)   . AAA (abdominal aortic aneurysm)   . Dyslipidemia   . HTN (hypertension)   . History of colonic polyps   .  Anxiety and depression   . DM (diabetes mellitus)   . GERD (gastroesophageal reflux disease)   . Diverticulosis of colon   . Asthma   . Acute renal failure   . Atrial flutter   . Hyperglycemia   . UTI (lower urinary tract infection)     ROS:   All systems reviewed and negative except as noted in the HPI.   Past Surgical History  Procedure Date  . Tubal ligation   . Hysterectomy with baldder suspension   . Appendectomy   . Pacemaker insertion     medtronic      Family History  Problem Relation Age of Onset  . Emphysema Maternal Uncle     multiple   . Asthma Daughter   . Colon cancer Sister     alive at 68      History   Social History  . Marital Status: Widowed    Spouse Name: N/A    Number of Children: N/A  . Years of Education: N/A   Occupational History  . Not on file.   Social History Main Topics  . Smoking status: Former Games developer  . Smokeless tobacco: Not on file  Comment: quit in 1990, smoked 1 ppd x 30 years   . Alcohol Use: No  . Drug Use: Not on file  . Sexually Active: Not on file   Other Topics Concern  . Not on file   Social History Narrative   Married, 4 children; retired Associate Professor.      BP 150/75  Pulse 85  Ht 5\' 3"  (1.6 m)  Wt 191 lb 6.4 oz (86.818 kg)  BMI 33.90 kg/m2  SpO2 90%  Physical Exam:  Well appearing 78 year old woman, NAD HEENT: Unremarkable Neck:  7 cm JVD, no thyromegally Lungs:  Scattered rales in the bases with no wheezes HEART:  Regular rate rhythm, no murmurs, no rubs, no clicks Abd:  soft, positive bowel sounds, no organomegally, no rebound, no guarding Ext:  2 plus pulses, no edema, no cyanosis, no clubbing Skin:  No rashes no nodules Neuro:  CN II through XII intact, motor grossly intact  ECG - normal sinus rhythm with ventricular pacing DEVICE  Normal device function.  See PaceArt for details. Left ventricular lead remains turned off  Assess/Plan:

## 2012-08-08 NOTE — Assessment & Plan Note (Signed)
Her left ventricular lead remains off, but otherwise her device is working normally. We'll recheck in several months.

## 2012-08-08 NOTE — Assessment & Plan Note (Signed)
She remains on home oxygen and bronchodilators. No change in medical therapy.

## 2012-08-08 NOTE — Assessment & Plan Note (Signed)
Her chronic systolic heart failure is fairly stable. I am concerned that she may have a little bit of volume overload based on her examination and I've asked the patient to increase her Lasix to 40 mg twice daily for 2 days. If her breathlessness is improved as well as her cough, she is instructed to call and we will repeat prescribe her diuretic to 60 or 80 mg daily in divided doses.

## 2012-08-08 NOTE — Patient Instructions (Signed)
Your physician wants you to follow-up in: 6 months in the device clinic and 12 months with Dr Court [name] will receive a reminder letter in the mail two months in advance. If you don't receive a letter, please call our office to schedule the follow-up appointment.   Increase Lasix for 2 days and call if breathing not better

## 2012-08-10 ENCOUNTER — Telehealth: Payer: Self-pay | Admitting: Internal Medicine

## 2012-08-10 NOTE — Telephone Encounter (Signed)
New problem:    Patient calling stating the medication is working.

## 2012-08-11 ENCOUNTER — Telehealth: Payer: Self-pay | Admitting: *Deleted

## 2012-08-11 MED ORDER — FUROSEMIDE 40 MG PO TABS: 40.0000 mg | ORAL_TABLET | Freq: Two times a day (BID) | ORAL | Status: DC

## 2012-08-11 NOTE — Telephone Encounter (Signed)
Spoke with patient and the fluid pill is helping  Will call in new Rx

## 2012-08-11 NOTE — Telephone Encounter (Signed)
lmtcb re: lasix Mylo Red RN

## 2012-08-11 NOTE — Telephone Encounter (Signed)
Discussed with Dr Ladona Ridgel  Stop the extra fluid pill and call me back.  I have left her a message on her voicemail

## 2012-09-07 ENCOUNTER — Encounter: Payer: Self-pay | Admitting: Internal Medicine

## 2012-10-13 ENCOUNTER — Other Ambulatory Visit: Payer: Self-pay | Admitting: Internal Medicine

## 2012-12-06 ENCOUNTER — Encounter: Payer: Self-pay | Admitting: Internal Medicine

## 2013-02-19 ENCOUNTER — Ambulatory Visit (INDEPENDENT_AMBULATORY_CARE_PROVIDER_SITE_OTHER): Payer: Medicare Other | Admitting: *Deleted

## 2013-02-19 DIAGNOSIS — I442 Atrioventricular block, complete: Secondary | ICD-10-CM

## 2013-02-19 LAB — PACEMAKER DEVICE OBSERVATION
ATRIAL PACING PM: 1
BAMS-0001: 150 {beats}/min
RV LEAD IMPEDENCE PM: 520 Ohm
VENTRICULAR PACING PM: 98

## 2013-02-19 NOTE — Progress Notes (Signed)
PPM check in office. 

## 2013-03-15 ENCOUNTER — Encounter: Payer: Self-pay | Admitting: Internal Medicine

## 2013-06-16 ENCOUNTER — Other Ambulatory Visit: Payer: Self-pay | Admitting: Internal Medicine

## 2013-06-22 ENCOUNTER — Telehealth: Payer: Self-pay | Admitting: Internal Medicine

## 2013-06-22 NOTE — Telephone Encounter (Signed)
Patient's daughter reports that she has chronic diarrhea and vomiting.  She is scheduled for 07/26/13.  I have placed her on the cancellation list.  She has remote history with Dr. Doreatha Martin in [place].  She was scheduled for a direct colon in 2010 that was cancelled due to a fever.  She has had diarrhea for years according to the daughter, but vomiting has started recently.  She will discuss with her primary care until office visit.

## 2013-06-28 ENCOUNTER — Telehealth: Payer: Self-pay | Admitting: *Deleted

## 2013-06-28 NOTE — Telephone Encounter (Signed)
[name] IS CALLING HAS SCHEDULED PT AT HER GI DR BUT CANT GET HER IN TILL 12/4 . [name] WANTS TO KNOW IS THERE ANYWAY WE CAN TRY TO GET HER IN SOONER?

## 2013-06-28 NOTE — Telephone Encounter (Signed)
Called daughter.lm to call me in am

## 2013-06-29 NOTE — Telephone Encounter (Signed)
Spoke with Dr. Leone Payor office and was told that patient daughter should contact PCP for an appointment since the diarrhea has been long term but vomiting is new symptom.    Please let me know if you would like the patient to come in for a visit with GAAM or try to see if [facility] office could see patient sooner.

## 2013-07-11 ENCOUNTER — Encounter: Payer: Self-pay | Admitting: Internal Medicine

## 2013-07-11 DIAGNOSIS — I82409 Acute embolism and thrombosis of unspecified deep veins of unspecified lower extremity: Secondary | ICD-10-CM | POA: Insufficient documentation

## 2013-07-11 DIAGNOSIS — I10 Essential (primary) hypertension: Secondary | ICD-10-CM | POA: Insufficient documentation

## 2013-07-11 DIAGNOSIS — J449 Chronic obstructive pulmonary disease, unspecified: Secondary | ICD-10-CM | POA: Insufficient documentation

## 2013-07-11 DIAGNOSIS — E1121 Type 2 diabetes mellitus with diabetic nephropathy: Secondary | ICD-10-CM | POA: Insufficient documentation

## 2013-07-11 DIAGNOSIS — K219 Gastro-esophageal reflux disease without esophagitis: Secondary | ICD-10-CM | POA: Insufficient documentation

## 2013-07-11 DIAGNOSIS — E538 Deficiency of other specified B group vitamins: Secondary | ICD-10-CM | POA: Insufficient documentation

## 2013-07-12 ENCOUNTER — Encounter: Payer: Self-pay | Admitting: Emergency Medicine

## 2013-07-12 ENCOUNTER — Ambulatory Visit: Payer: Medicare Other | Admitting: Emergency Medicine

## 2013-07-12 VITALS — BP 112/80 | HR 90 | Temp 98.2°F | Resp 22 | Ht 63.0 in | Wt 193.0 lb

## 2013-07-12 DIAGNOSIS — J441 Chronic obstructive pulmonary disease with (acute) exacerbation: Secondary | ICD-10-CM

## 2013-07-12 DIAGNOSIS — R5381 Other malaise: Secondary | ICD-10-CM

## 2013-07-12 DIAGNOSIS — R197 Diarrhea, unspecified: Secondary | ICD-10-CM

## 2013-07-12 LAB — BASIC METABOLIC PANEL WITH GFR
Chloride: 106 mEq/L (ref 96–112)
GFR, Est Non African American: 30 mL/min — ABNORMAL LOW
Potassium: 4.5 mEq/L (ref 3.5–5.3)
Sodium: 144 mEq/L (ref 135–145)

## 2013-07-12 LAB — HEPATIC FUNCTION PANEL
Alkaline Phosphatase: 80 U/L (ref 39–117)
Bilirubin, Direct: 0.1 mg/dL (ref 0.0–0.3)
Indirect Bilirubin: 0.4 mg/dL (ref 0.0–0.9)
Total Bilirubin: 0.5 mg/dL (ref 0.3–1.2)

## 2013-07-12 LAB — CBC WITH DIFFERENTIAL/PLATELET
Eosinophils Relative: 2 % (ref 0–5)
HCT: 36 % (ref 36.0–46.0)
Lymphocytes Relative: 23 % (ref 12–46)
Lymphs Abs: 1.7 10*3/uL (ref 0.7–4.0)
MCV: 95.2 fL (ref 78.0–100.0)
Monocytes Absolute: 0.6 10*3/uL (ref 0.1–1.0)
Neutro Abs: 4.9 10*3/uL (ref 1.7–7.7)
Platelets: 162 10*3/uL (ref 150–400)
RBC: 3.78 MIL/uL — ABNORMAL LOW (ref 3.87–5.11)
WBC: 7.4 10*3/uL (ref 4.0–10.5)

## 2013-07-12 LAB — LIPASE: Lipase: 65 U/L (ref 0–75)

## 2013-07-12 MED ORDER — CEFTRIAXONE SODIUM 500 MG IJ SOLR
500.0000 mg | Freq: Once | INTRAMUSCULAR | Status: AC
  Administered 2013-07-12: 500 mg via INTRAMUSCULAR

## 2013-07-12 MED ORDER — BENZONATATE 100 MG PO CAPS: 100.0000 mg | ORAL_CAPSULE | Freq: Three times a day (TID) | ORAL | Status: DC | PRN

## 2013-07-12 NOTE — Patient Instructions (Signed)
Diarrhea Diarrhea is watery poop (stool). It can make you feel weak, tired, thirsty, or give you a dry mouth (signs of dehydration). Watery poop is a sign of another problem, most often an infection. It often lasts 2 3 days. It can last longer if it is a sign of something serious. Take care of yourself as told by your doctor. HOME CARE   Drink 1 cup (8 ounces) of fluid each time you have watery poop.  Do not drink the following fluids:  Those that contain simple sugars (fructose, glucose, galactose, lactose, sucrose, maltose).  Sports drinks.  Fruit juices.  Whole milk products.  Sodas.  Drinks with caffeine (coffee, tea, soda) or alcohol.  Oral rehydration solution may be used if the doctor says it is okay. You may make your own solution. Follow this recipe:    teaspoon table salt.   teaspoon baking soda.   teaspoon salt substitute containing potassium chloride.  1 tablespoons sugar.  1 liter (34 ounces) of water.  Avoid the following foods:  High fiber foods, such as raw fruits and vegetables.  Nuts, seeds, and whole grain breads and cereals.   Those that are sweetened with sugar alcohols (xylitol, sorbitol, mannitol).  Try eating the following foods:  Starchy foods, such as rice, toast, pasta, low-sugar cereal, oatmeal, baked potatoes, crackers, and bagels.  Bananas.  Applesauce.  Eat probiotic-rich foods, such as yogurt and milk products that are fermented.  Wash your hands well after each time you have watery poop.  Only take medicine as told by your doctor.  Take a warm bath to help lessen burning or pain from having watery poop. GET HELP RIGHT AWAY IF:   You cannot drink fluids without throwing up (vomiting).  You keep throwing up.  You have blood in your poop, or your poop looks black and tarry.  You do not pee (urinate) in 6 8 hours, or there is only a small amount of very dark pee.  You have belly (abdominal) pain that gets worse or stays in  the same spot (localizes).  You are weak, dizzy, confused, or lightheaded.  You have a very bad headache.  Your watery poop gets worse or does not get better.  You have a fever or lasting symptoms for more than 2 3 days.  You have a fever and your symptoms suddenly get worse. MAKE SURE YOU:   Understand these instructions.  Will watch your condition.  Will get help right away if you are not doing well or get worse. Document Released: 01/26/2008 Document Revised: 05/03/2012 Document Reviewed: 04/16/2012 [employer] Patient Information 2014 [employer].  

## 2013-07-12 NOTE — Progress Notes (Signed)
Subjective:    Patient ID: [name], female    DOB: February 11, 1932, 79 y.o.   MRN: [id number]  HPI Comments: 79 yo female presents with increased chest congestion and difficulty bring up sputum. Using duoneb 2 x a day, but meds are expired, and has not seen any improvement with symptoms. She is also still having diarrhea and now epigastric pain. She notices symptoms are worse after she eats, but denies particular food triggers. She does have her GB. She has a GI appointment early Dec. She is staying tired and feels like she is burning from her mouth all the way through her body.  Cough Associated symptoms include shortness of breath.  Shortness of Breath Associated symptoms include abdominal pain.  Diarrhea  Associated symptoms include abdominal pain and coughing.    Current Outpatient Prescriptions on File Prior to Visit  Medication Sig Dispense Refill  . albuterol (VENTOLIN HFA) 108 (90 BASE) MCG/ACT inhaler Inhale 2 puffs into the lungs every 6 (six) hours as needed. For shortness of breath.      Marland Kitchen aspirin 81 MG EC tablet Take 81 mg by mouth daily.        . Cholecalciferol (VITAMIN D) 2000 UNITS tablet Take 1,000 Units by mouth daily.       Marland Kitchen esomeprazole (NEXIUM) 40 MG capsule Take 40 mg by mouth daily before breakfast.      . furosemide (LASIX) 40 MG tablet Take 1 tablet (40 mg total) by mouth 2 (two) times daily.  60 tablet  5  . ipratropium-albuterol (DUONEB) 0.5-2.5 (3) MG/3ML SOLN Take 3 mLs by nebulization 2 (two) times daily.        . montelukast (SINGULAIR) 10 MG tablet Take 10 mg by mouth daily.        Marland Kitchen olmesartan-hydrochlorothiazide (BENICAR HCT) 20-12.5 MG per tablet Take 1 tablet by mouth daily.        Marland Kitchen PARoxetine (PAXIL) 20 MG tablet Take 20 mg by mouth daily.       . potassium chloride SA (K-DUR,KLOR-CON) 20 MEQ tablet TAKE ONE TABLET BY MOUTH ONE  TIME DAILY  30 tablet  2  . simvastatin (ZOCOR) 80 MG tablet Take 40 mg by mouth. M, W F       . tiotropium (SPIRIVA)  18 MCG inhalation capsule Place 18 mcg into inhaler and inhale daily.        . vitamin B-12 (CYANOCOBALAMIN) 1000 MCG tablet Take 1,000 mcg by mouth daily.       No current facility-administered medications on file prior to visit.   ALLERGIES Advair diskus; Biaxin; Ciprofloxacin; Keflex; Ketek; Levaquin; Macrobid; Penicillins; and Sulfonamide derivatives   Past Medical History  Diagnosis Date  . AV block, complete   . CHF (congestive heart failure)   . AAA (abdominal aortic aneurysm)   . Dyslipidemia   . History of colonic polyps   . Anxiety and depression   . Diverticulosis of colon   . Asthma   . Acute renal failure   . Atrial flutter   . Hyperglycemia   . UTI (lower urinary tract infection)   . COPD (chronic obstructive pulmonary disease)   . HTN (hypertension)   . DM (diabetes mellitus)   . GERD (gastroesophageal reflux disease)   . DVT (deep venous thrombosis)   . B12 deficiency     Review of Systems  Constitutional: Positive for fatigue.  HENT: Positive for congestion.   Respiratory: Positive for cough and shortness of breath.   Gastrointestinal: Positive  for abdominal pain and diarrhea.  All other systems reviewed and are negative.    BP 112/80  Pulse 90  Temp(Src) 98.2 F (36.8 C) (Temporal)  Resp 22  Ht 5\' 3"  (1.6 m)  Wt 193 lb (87.544 kg)  BMI 34.20 kg/m2  SpO2 91%     Objective:   Physical Exam  Nursing note and vitals reviewed. Constitutional: She is oriented to person, place, and time. She appears well-developed and well-nourished.  HENT:  Head: Normocephalic and atraumatic.  Right Ear: External ear normal.  Left Ear: External ear normal.  Nose: Nose normal.  Mouth/Throat: Oropharynx is clear and moist.  Thick exudate tan post pharynx  Eyes: Pupils are equal, round, and reactive to light.  Neck: Normal range of motion. No thyromegaly present.  Cardiovascular: Normal rate, regular rhythm, normal heart sounds and intact distal pulses.    Pulmonary/Chest: Effort normal and breath sounds normal.  Abdominal: Soft. Bowel sounds are normal. There is tenderness.  epigastric  Musculoskeletal: Normal range of motion.  Lymphadenopathy:    She has no cervical adenopathy.  Neurological: She is alert and oriented to person, place, and time.  Skin: Skin is warm and dry.  Psychiatric: She has a normal mood and affect. Judgment normal.          Assessment & Plan:  1. COPD exacerbation- patient has PRED 10 mg at home instructions given for 1 TID x 3d, 1 PO BID x 3d, 1 PO QD x 5d. Rocephin 1 gm given in office. Patient had 500 mg previous visit without any SE. Tessalon Perles AD, increase H2o. Increase Duoneb to TID sx x 1 box given  2. Diarrhea with mild improvement. Check labs if neg will need ABD u/s to r/o GB, she has GI appointment early 12/14. Restart Restora 1 qd sx #10 given. Bland diet, pt has lomotil RX at home will restart if diarrhea worsens. Push fluids.

## 2013-07-13 ENCOUNTER — Other Ambulatory Visit: Payer: Self-pay | Admitting: Emergency Medicine

## 2013-07-13 DIAGNOSIS — R197 Diarrhea, unspecified: Secondary | ICD-10-CM

## 2013-07-13 DIAGNOSIS — R1013 Epigastric pain: Secondary | ICD-10-CM

## 2013-07-17 ENCOUNTER — Other Ambulatory Visit: Payer: Medicare Other

## 2013-07-20 ENCOUNTER — Other Ambulatory Visit: Payer: Medicare Other

## 2013-07-24 ENCOUNTER — Other Ambulatory Visit: Payer: Medicare Other

## 2013-07-26 ENCOUNTER — Encounter: Payer: Self-pay | Admitting: Emergency Medicine

## 2013-07-26 ENCOUNTER — Ambulatory Visit: Payer: Medicare Other | Admitting: Internal Medicine

## 2013-07-26 NOTE — Progress Notes (Signed)
Patient ID: [name], female   DOB: 07-12-1932, 79 y.o.   MRN: [id number] Patient was scheduled for U/S of Abdomen to evaluate chronic diarrhea, patient cancelled appointment and did not reschedule.

## 2013-07-30 ENCOUNTER — Encounter: Payer: Self-pay | Admitting: Internal Medicine

## 2013-08-09 ENCOUNTER — Encounter: Payer: Medicare Other | Admitting: Internal Medicine

## 2013-08-28 ENCOUNTER — Encounter: Payer: Self-pay | Admitting: Internal Medicine

## 2013-08-28 ENCOUNTER — Ambulatory Visit (INDEPENDENT_AMBULATORY_CARE_PROVIDER_SITE_OTHER): Payer: Medicare Other | Admitting: Internal Medicine

## 2013-08-28 VITALS — BP 162/79 | HR 96 | Ht 63.0 in | Wt 191.4 lb

## 2013-08-28 DIAGNOSIS — I442 Atrioventricular block, complete: Secondary | ICD-10-CM

## 2013-08-28 DIAGNOSIS — I5022 Chronic systolic (congestive) heart failure: Secondary | ICD-10-CM

## 2013-08-28 DIAGNOSIS — I509 Heart failure, unspecified: Secondary | ICD-10-CM

## 2013-08-28 DIAGNOSIS — Z950 Presence of cardiac pacemaker: Secondary | ICD-10-CM

## 2013-08-28 LAB — MDC_IDC_ENUM_SESS_TYPE_INCLINIC
Battery Voltage: 2.98 V
Brady Statistic AP VP Percent: 3 %
Brady Statistic AS VP Percent: 95 %
Brady Statistic AS VS Percent: 2 %
Date Time Interrogation Session: 20150106134309
Implantable Pulse Generator Model: 8042
Lead Channel Impedance Value: 415 Ohm
Lead Channel Impedance Value: 566 Ohm
Lead Channel Pacing Threshold Amplitude: 1 V
Lead Channel Pacing Threshold Amplitude: 2 V
Lead Channel Pacing Threshold Pulse Width: 0.4 ms
Lead Channel Pacing Threshold Pulse Width: 1.5 ms
Lead Channel Sensing Intrinsic Amplitude: 2.8 mV
Lead Channel Setting Pacing Amplitude: 2.5 V
Lead Channel Setting Sensing Sensitivity: 2.8 mV
MDC IDC MSMT BATTERY REMAINING LONGEVITY: 62 mo
MDC IDC MSMT LEADCHNL LV IMPEDANCE VALUE: 0 Ohm
MDC IDC MSMT LEADCHNL LV SENSING INTR AMPL: 4 mV
MDC IDC MSMT LEADCHNL RA PACING THRESHOLD PULSEWIDTH: 0.4 ms
MDC IDC MSMT LEADCHNL RV PACING THRESHOLD AMPLITUDE: 1 V
MDC IDC SET LEADCHNL RA PACING AMPLITUDE: 2 V
MDC IDC SET LEADCHNL RV PACING PULSEWIDTH: 0.4 ms
MDC IDC STAT BRADY AP VS PERCENT: 0 %

## 2013-08-28 NOTE — Assessment & Plan Note (Signed)
Today we tried to reprogram her LV lead to allow for BiV pacing and not cause diaphragmatic stimulation. We could not. Her diaphragm threshold was 1-1.5 and her LV threshold was 2-2.5. LV pacing is left off. I will review her old angios to see if one of our new leads might be utilized to allow for LV pacing without diaphragmatic stimulation.

## 2013-08-28 NOTE — Progress Notes (Signed)
HPI [name] returns today for followup. She is a pleasant and 80 year old woman with a history of chronic systolic heart failure, complete heart block, hypertension, and COPD on home oxygen. In the interim, she has been stable but does note a chronic cough and increased shortness of breath, but no peripheral edema. No syncope. Her left ventricular lead was turned off secondary to diaphragmatic stimulation. Now she notes that with any exertion, her breath gets short and she has to stop what she is doing. Allergies  Allergen Reactions  . Advair Diskus [Fluticasone-Salmeterol]     Itching  . Biaxin [Clarithromycin]     N/V  . Ciprofloxacin     N/V  . Keflex [Cephalexin]     N/V  . Ketek [Telithromycin]     N/V  . Levaquin [Levofloxacin In D5w]     N/V  . Macrobid [Nitrofurantoin]     N/V  . Penicillins     Rash  . Sulfonamide Derivatives      Current Outpatient Prescriptions  Medication Sig Dispense Refill  . albuterol (VENTOLIN HFA) 108 (90 BASE) MCG/ACT inhaler Inhale 2 puffs into the lungs every 6 (six) hours as needed. For shortness of breath.      Marland Kitchen aspirin 81 MG EC tablet Take 81 mg by mouth daily.        . Cholecalciferol (VITAMIN D) 2000 UNITS tablet Take 5,000 Units by mouth daily.       Marland Kitchen esomeprazole (NEXIUM) 40 MG capsule Take 40 mg by mouth daily before breakfast.      . furosemide (LASIX) 40 MG tablet Take 40 mg by mouth daily.      . montelukast (SINGULAIR) 10 MG tablet Take 10 mg by mouth daily.        Marland Kitchen olmesartan-hydrochlorothiazide (BENICAR HCT) 20-12.5 MG per tablet Take 1 tablet by mouth daily.        Marland Kitchen PARoxetine (PAXIL) 20 MG tablet Take 20 mg by mouth daily.       . potassium chloride SA (K-DUR,KLOR-CON) 20 MEQ tablet TAKE ONE TABLET BY MOUTH ONE  TIME DAILY  30 tablet  2  . simvastatin (ZOCOR) 80 MG tablet Take 40 mg by mouth. M, W F       . tiotropium (SPIRIVA) 18 MCG inhalation capsule Place 18 mcg into inhaler and inhale daily.        . vitamin B-12  (CYANOCOBALAMIN) 1000 MCG tablet Take 1,000 mcg by mouth daily.       No current facility-administered medications for this visit.     Past Medical History  Diagnosis Date  . AV block, complete   . CHF (congestive heart failure)   . AAA (abdominal aortic aneurysm)   . Dyslipidemia   . History of colonic polyps   . Anxiety and depression   . Diverticulosis of colon   . Asthma   . Acute renal failure   . Atrial flutter   . Hyperglycemia   . UTI (lower urinary tract infection)   . COPD (chronic obstructive pulmonary disease)   . HTN (hypertension)   . DM (diabetes mellitus)   . GERD (gastroesophageal reflux disease)   . DVT (deep venous thrombosis)   . B12 deficiency     ROS:   All systems reviewed and negative except as noted in the HPI.   Past Surgical History  Procedure Laterality Date  . Tubal ligation    . Hysterectomy with baldder suspension    . Appendectomy    .  Pacemaker insertion      medtronic   . Colonoscopy  02/06/2002     Family History  Problem Relation Age of Onset  . Emphysema Maternal Uncle     multiple   . Asthma Daughter   . Colon cancer Sister     alive at 5080   . Heart disease Father   . CVA Mother   . Heart disease Mother      History   Social History  . Marital Status: Widowed    Spouse Name: N/A    Number of Children: N/A  . Years of Education: N/A   Occupational History  . Not on file.   Social History Main Topics  . Smoking status: Former Smoker    Quit date: 08/23/1988  . Smokeless tobacco: Not on file     Comment: quit in 1990, smoked 1 ppd x 30 years   . Alcohol Use: No  . Drug Use: Not on file  . Sexual Activity: Not on file   Other Topics Concern  . Not on file   Social History Narrative   Married, 4 children; retired Associate Professorretail worker.      BP 162/79  Pulse 96  Ht 5\' 3"  (1.6 m)  Wt 191 lb 6.4 oz (86.818 kg)  BMI 33.91 kg/m2  Physical Exam:  Well appearing 80 year old woman, NAD HEENT:  Unremarkable Neck:  7 cm JVD, no thyromegally Lungs:  Scattered rales in the bases with no wheezes HEART:  Regular rate rhythm, no murmurs, no rubs, no clicks Abd:  soft, positive bowel sounds, no organomegally, no rebound, no guarding Ext:  2 plus pulses, no edema, no cyanosis, no clubbing Skin:  No rashes no nodules Neuro:  CN II through XII intact, motor grossly intact  ECG - normal sinus rhythm with ventricular pacing DEVICE  Normal device function.  See PaceArt for details. Left ventricular lead remains turned off  Assess/Plan:

## 2013-08-28 NOTE — Patient Instructions (Signed)
Your physician wants you to follow-up in: 6 months in the device clinic and 12 months with Dr Court Joyaylor You will receive a reminder letter in the mail two months in advance. If you don't receive a letter, please call our office to schedule the follow-up appointment.   We will call you after Dr Ladona Ridgelaylor reviews films

## 2013-08-28 NOTE — Assessment & Plan Note (Signed)
Her heart failure symptoms are class 3B. While her dyspnea is multifactorial, I feel like a large part of it is related to RV dysynchronous pacing. Will review angios and consider whether we should try to proceed with LV lead revision. This might require removal of her old LV lead.

## 2013-09-05 ENCOUNTER — Ambulatory Visit (INDEPENDENT_AMBULATORY_CARE_PROVIDER_SITE_OTHER): Payer: Medicare Other | Admitting: Physician Assistant

## 2013-09-05 ENCOUNTER — Encounter: Payer: Self-pay | Admitting: Physician Assistant

## 2013-09-05 VITALS — BP 100/62 | HR 80 | Temp 98.9°F | Resp 16 | Ht 63.0 in

## 2013-09-05 DIAGNOSIS — J449 Chronic obstructive pulmonary disease, unspecified: Secondary | ICD-10-CM

## 2013-09-05 DIAGNOSIS — J209 Acute bronchitis, unspecified: Secondary | ICD-10-CM

## 2013-09-05 MED ORDER — DOXYCYCLINE HYCLATE 100 MG PO TABS
100.0000 mg | ORAL_TABLET | Freq: Two times a day (BID) | ORAL | Status: DC
Start: 2013-09-05 — End: 2013-09-11

## 2013-09-05 MED ORDER — PREDNISONE 20 MG PO TABS: ORAL_TABLET | ORAL | Status: DC

## 2013-09-05 NOTE — Progress Notes (Signed)
   Subjective:    Patient ID: [name], female    DOB: [birth year], 80 y.o.   MRN: [id number]  Cough This is a new problem. Episode onset: 2 weeks. The problem has been gradually worsening. The cough is productive of purulent sputum. Associated symptoms include chills, ear congestion, shortness of breath and wheezing. Pertinent negatives include no chest pain, ear pain, fever, headaches, heartburn, hemoptysis, myalgias, nasal congestion, postnasal drip, rash, rhinorrhea, sore throat, sweats or weight loss. She has tried OTC cough suppressant for the symptoms. The treatment provided no relief. Her past medical history is significant for COPD. 5L oxygen at home, wheel chair dependent    Review of Systems  Constitutional: Positive for chills. Negative for fever, weight loss and fatigue.  HENT: Negative for congestion, dental problem, ear pain, postnasal drip, rhinorrhea and sore throat.   Respiratory: Positive for cough, shortness of breath and wheezing. Negative for hemoptysis and chest tightness.   Cardiovascular: Negative for chest pain, palpitations and leg swelling.       Denies PND, orthopnea, weight gain. Saw cardiologist recently.   Gastrointestinal: Negative.  Negative for heartburn.  Musculoskeletal: Negative.  Negative for myalgias.  Skin: Negative for rash.  Neurological: Negative for headaches.       Objective:   Physical Exam  Constitutional: She is oriented to person, place, and time. She appears well-developed and well-nourished.  HENT:  Head: Normocephalic and atraumatic.  Right Ear: External ear normal.  Left Ear: External ear normal.  Nose: Nose normal.  Mouth/Throat: Oropharynx is clear and moist.  Eyes: Conjunctivae are normal. Pupils are equal, round, and reactive to light.  Neck: Normal range of motion. Neck supple.  Cardiovascular: Normal rate and regular rhythm.   Pulmonary/Chest: Effort normal. No respiratory distress. She has wheezes. She has no rales.  She exhibits no tenderness.  Abdominal: Soft. Bowel sounds are normal.  Lymphadenopathy:    She has no cervical adenopathy.  Neurological: She is alert and oriented to person, place, and time.  Skin: Skin is warm and dry.      Assessment & Plan:  Acute bronchitis/AECOPD - Plan: doxycycline (VIBRA-TABS) 100 MG tablet, predniSONE (DELTASONE) 20 MG tablet  No signs of fluid over load, no weight gain, edema, PNB, orthopnea, and no CP.    deuoneb at home, separate ipatropium and albuterol samples given to patient  If worse please go to ER.

## 2013-09-05 NOTE — Patient Instructions (Signed)

## 2013-09-07 ENCOUNTER — Ambulatory Visit: Payer: Medicare Other | Admitting: Internal Medicine

## 2013-09-11 ENCOUNTER — Encounter: Payer: Self-pay | Admitting: Internal Medicine

## 2013-09-11 ENCOUNTER — Ambulatory Visit (INDEPENDENT_AMBULATORY_CARE_PROVIDER_SITE_OTHER): Payer: Medicare Other | Admitting: Internal Medicine

## 2013-09-11 VITALS — BP 126/80 | HR 80 | Temp 98.2°F | Resp 28

## 2013-09-11 DIAGNOSIS — E119 Type 2 diabetes mellitus without complications: Secondary | ICD-10-CM

## 2013-09-11 DIAGNOSIS — I10 Essential (primary) hypertension: Secondary | ICD-10-CM | POA: Insufficient documentation

## 2013-09-11 DIAGNOSIS — J019 Acute sinusitis, unspecified: Secondary | ICD-10-CM

## 2013-09-11 DIAGNOSIS — E782 Mixed hyperlipidemia: Secondary | ICD-10-CM

## 2013-09-11 DIAGNOSIS — E559 Vitamin D deficiency, unspecified: Secondary | ICD-10-CM | POA: Insufficient documentation

## 2013-09-11 DIAGNOSIS — Z79899 Other long term (current) drug therapy: Secondary | ICD-10-CM

## 2013-09-11 LAB — CBC WITH DIFFERENTIAL/PLATELET
BASOS ABS: 0 10*3/uL (ref 0.0–0.1)
Basophils Relative: 0 % (ref 0–1)
EOS PCT: 2 % (ref 0–5)
Eosinophils Absolute: 0.2 10*3/uL (ref 0.0–0.7)
HCT: 36.6 % (ref 36.0–46.0)
Hemoglobin: 11.8 g/dL — ABNORMAL LOW (ref 12.0–15.0)
LYMPHS PCT: 22 % (ref 12–46)
Lymphs Abs: 1.5 10*3/uL (ref 0.7–4.0)
MCH: 30.3 pg (ref 26.0–34.0)
MCHC: 32.2 g/dL (ref 30.0–36.0)
MCV: 94.1 fL (ref 78.0–100.0)
Monocytes Absolute: 0.5 10*3/uL (ref 0.1–1.0)
Monocytes Relative: 7 % (ref 3–12)
NEUTROS ABS: 4.8 10*3/uL (ref 1.7–7.7)
NEUTROS PCT: 69 % (ref 43–77)
PLATELETS: 143 10*3/uL — AB (ref 150–400)
RBC: 3.89 MIL/uL (ref 3.87–5.11)
RDW: 14 % (ref 11.5–15.5)
WBC: 6.9 10*3/uL (ref 4.0–10.5)

## 2013-09-11 MED ORDER — AZITHROMYCIN 250 MG PO TABS: ORAL_TABLET | ORAL | Status: DC

## 2013-09-11 MED ORDER — AZITHROMYCIN 250 MG PO TABS: ORAL_TABLET | ORAL | Status: AC

## 2013-09-11 NOTE — Progress Notes (Signed)
Patient ID: [name], female   DOB: [birth year], 80 y.o.   MRN: [id number]   This very nice 80 y.o. [facility] presents for 3 month follow up with Hypertension, Hyperlipidemia, Pre-Diabetes and Vitamin D Deficiency.    HTN predates since 1996. BP has been controlled at home. Today's BP: 126/80 mmHg.  In 2009  She had a pacemaker inserted for CHB by Dr Graciela Husbands. Patient denies any cardiac type chest pain, palpitations, dyspnea/orthopnea/PND, dizziness, claudication, or dependent edema.   Hyperlipidemia is controlled with diet & meds. Last Cholesterol was 184, Triglycerides were  364, HDL 36 and LDL 75. Patient denies myalgias or other med SE's.     Patient has moderately severe O2 Dependent with home O2 sats at 90% at rest and she uses the O2 up to 4-5 LPM. About 2-3 weeks ago, she was treated with doxycycline for a UTI and developed an oral rash. She persists with a productive mucopurulent sputum.   Also, the patient has history of Diabetes since 2007 with last A1c of 6.7% in Oct 2014. Patient denies any symptoms of reactive hypoglycemia, diabetic polys, paresthesias or visual blurring.   Further, Patient has history of Vitamin D Deficiency of 6 in 2008 with last vitamin D of 57 in Oct 2014. Patient supplements vitamin D without any suspected side-effects.    Medication List       This list is accurate as of: 09/11/13  7:24 PM.  Always use your most recent med list.               aspirin 81 MG EC tablet  Take 81 mg by mouth daily.     azithromycin 250 MG tablet  Commonly known as:  ZITHROMAX  Take 2 tablets (500 mg) on  Day 1,  followed by 1 tablet (250 mg) once daily on Days 2 through 5.     esomeprazole 40 MG capsule  Commonly known as:  NEXIUM  Take 40 mg by mouth daily before breakfast.     furosemide 40 MG tablet  Commonly known as:  LASIX  Take 40 mg by mouth daily.     montelukast 10 MG tablet  Commonly known as:  SINGULAIR  Take 10 mg by mouth daily.     olmesartan-hydrochlorothiazide 20-12.5 MG per tablet  Commonly known as:  BENICAR HCT  Take 1 tablet by mouth daily.     PARoxetine 20 MG tablet  Commonly known as:  PAXIL  Take 20 mg by mouth daily.     potassium chloride SA 20 MEQ tablet  Commonly known as:  K-DUR,KLOR-CON  TAKE ONE TABLET BY MOUTH ONE  TIME DAILY     simvastatin 80 MG tablet  Commonly known as:  ZOCOR  Take 40 mg by mouth. M, W F     tiotropium 18 MCG inhalation capsule  Commonly known as:  SPIRIVA  Place 18 mcg into inhaler and inhale daily.     VENTOLIN HFA 108 (90 BASE) MCG/ACT inhaler  Generic drug:  albuterol  Inhale 2 puffs into the lungs every 6 (six) hours as needed. For shortness of breath.     vitamin B-12 1000 MCG tablet  Commonly known as:  CYANOCOBALAMIN  Take 1,000 mcg by mouth daily.     Vitamin D 2000 UNITS tablet  Take 5,000 Units by mouth daily.         Allergies  Allergen Reactions  . Advair Diskus [Fluticasone-Salmeterol]     Itching  . Biaxin [Clarithromycin]  N/V  . Ciprofloxacin     N/V  . Doxycycline Other (See Comments)    Inside of mouth and tongue red and peeled  . Keflex [Cephalexin]     N/V  . Ketek [Telithromycin]     N/V  . Levaquin [Levofloxacin In D5w]     N/V  . Macrobid [Nitrofurantoin]     N/V  . Penicillins     Rash  . Sulfonamide Derivatives     PMHx:   Past Medical History  Diagnosis Date  . AV block, complete   . CHF (congestive heart failure)   . AAA (abdominal aortic aneurysm)   . Dyslipidemia   . History of colonic polyps   . Anxiety and depression   . Diverticulosis of colon   . Asthma   . Acute renal failure   . Atrial flutter   . Hyperglycemia   . UTI (lower urinary tract infection)   . COPD (chronic obstructive pulmonary disease)   . HTN (hypertension)   . DM (diabetes mellitus)   . GERD (gastroesophageal reflux disease)   . DVT (deep venous thrombosis)   . B12 deficiency     FHx:    Reviewed / unchanged  SHx:     Reviewed / unchanged  Systems Review: Constitutional: Denies fever, chills, wt changes, headaches, insomnia, fatigue, night sweats, change in appetite. Eyes: Denies redness, blurred vision, diplopia, discharge, itchy, watery eyes.  ENT: Denies discharge, congestion, post nasal drip, epistaxis, sore throat, earache, hearing loss, dental pain, tinnitus, vertigo, sinus pain, snoring.  CV: Denies chest pain, palpitations, irregular heartbeat, syncope, dyspnea, diaphoresis, orthopnea, PND, claudication, edema. Respiratory: denies cough, dyspnea, DOE, pleurisy, hoarseness, laryngitis, wheezing.  Gastrointestinal: Denies dysphagia, odynophagia, heartburn, reflux, water brash, abdominal pain or cramps, nausea, vomiting, bloating, diarrhea, constipation, hematemesis, melena, hematochezia,  or hemorrhoids. Genitourinary: Denies dysuria, frequency, urgency, nocturia, hesitancy, discharge, hematuria, flank pain. Musculoskeletal: Denies arthralgias, myalgias, stiffness, jt. swelling, pain, limp, strain/sprain.  Skin: Denies pruritus, rash, hives, warts, acne, eczema, change in skin lesion(s). Neuro: No weakness, tremor, incoordination, spasms, paresthesia, or pain. Psychiatric: Denies confusion, memory loss, or sensory loss. Endo: Denies change in weight, skin, hair change.  Heme/Lymph: No excessive bleeding, bruising, orenlarged lymph nodes.  Filed Vitals:   09/11/13 1433  BP: 126/80  Pulse: 80  Temp: 98.2 F (36.8 C)  Resp: 28    Estimated body mass index is 33.91 kg/(m^2) as calculated from the following:   Height as of 09/05/13: 5\' 3"  (1.6 m).   Weight as of 08/28/13: 191 lb 6.4 oz (86.818 kg).  On Exam: Appears well nourished - in no distress. Eyes: PERRLA, EOMs, conjunctiva no swelling or erythema. Sinuses: No frontal/maxillary tenderness ENT/Mouth: EAC's clear, TM's nl w/o erythema, bulging. Nares clear w/o erythema, swelling, exudates. Oropharynx clear without erythema or exudates. Oral  hygiene is good. Tongue normal, non obstructing. Hearing intact.  Neck: Supple. Thyroid nl. Car 2+/2+ without bruits, nodes or JVD. Chest: Respirations nl with BS clear & equal w/o rales, rhonchi, wheezing or stridor.  Cor: Heart sounds normal w/ regular rate and rhythm without sig. murmurs, gallops, clicks, or rubs. Peripheral pulses normal and equal  without edema.  Abdomen: Soft & bowel sounds normal. Non-tender w/o guarding, rebound, hernias, masses, or organomegaly.  Lymphatics: Unremarkable.  Musculoskeletal: Full ROM all peripheral extremities, joint stability, 5/5 strength, and normal gait.  Skin: Warm, dry without exposed rashes, lesions, ecchymosis apparent.  Neuro: Cranial nerves intact, reflexes equal bilaterally. Sensory-motor testing grossly intact. Tendon reflexes grossly  intact.  Pysch: Alert & oriented x 3. Insight and judgement nl & appropriate. No ideations.  Assessment and Plan:  1. Hypertension - Continue monitor blood pressure at home. Continue diet/meds same.  2. Hyperlipidemia - Continue diet/meds, exercise,& lifestyle modifications. Continue monitor periodic cholesterol/liver & renal functions   3. Pre-diabetes/Insulin Resistance - Continue diet, exercise, lifestyle modifications. Monitor appropriate labs.  3. Diabetes - continue recommend prudent low glycemic diet, weight control, regular exercise, diabetic monitoring and periodic eye exams.  4. Vitamin D Deficiency - Continue supplementation.  5. AECB - Rx Zpak x 3rf if needed  Recommended regular exercise, BP monitoring, weight control, and discussed med and SE's. Recommended labs to assess and monitor clinical status. Further disposition pending results of labs.

## 2013-09-11 NOTE — Patient Instructions (Signed)

## 2013-09-12 LAB — BASIC METABOLIC PANEL WITH GFR
BUN: 24 mg/dL — ABNORMAL HIGH (ref 6–23)
CO2: 33 mEq/L — ABNORMAL HIGH (ref 19–32)
Calcium: 9.2 mg/dL (ref 8.4–10.5)
Chloride: 102 mEq/L (ref 96–112)
Creat: 1.59 mg/dL — ABNORMAL HIGH (ref 0.50–1.10)
GFR, EST NON AFRICAN AMERICAN: 31 mL/min — AB
GFR, Est African American: 35 mL/min — ABNORMAL LOW
Glucose, Bld: 175 mg/dL — ABNORMAL HIGH (ref 70–99)
POTASSIUM: 4.3 meq/L (ref 3.5–5.3)
SODIUM: 144 meq/L (ref 135–145)

## 2013-09-12 LAB — HEPATIC FUNCTION PANEL
ALT: <8 U/L (ref 0–35)
AST: 12 U/L (ref 0–37)
Albumin: 3.7 g/dL (ref 3.5–5.2)
Alkaline Phosphatase: 76 U/L (ref 39–117)
Bilirubin, Direct: 0.1 mg/dL (ref 0.0–0.3)
Indirect Bilirubin: 0.3 mg/dL (ref 0.0–0.9)
Total Bilirubin: 0.4 mg/dL (ref 0.3–1.2)
Total Protein: 6.1 g/dL (ref 6.0–8.3)

## 2013-09-12 LAB — LIPID PANEL
Cholesterol: 199 mg/dL (ref 0–200)
HDL: 41 mg/dL
Total CHOL/HDL Ratio: 4.9 ratio
Triglycerides: 489 mg/dL — ABNORMAL HIGH

## 2013-09-12 LAB — TSH: TSH: 1.16 u[IU]/mL (ref 0.350–4.500)

## 2013-09-12 LAB — INSULIN, FASTING: Insulin fasting, serum: 57 u[IU]/mL — ABNORMAL HIGH (ref 3–28)

## 2013-09-12 LAB — HEMOGLOBIN A1C
Hgb A1c MFr Bld: 7.1 % — ABNORMAL HIGH (ref <5.7)
Mean Plasma Glucose: 157 mg/dL — ABNORMAL HIGH (ref <117)

## 2013-09-12 LAB — MAGNESIUM: Magnesium: 2.1 mg/dL (ref 1.5–2.5)

## 2013-09-12 LAB — VITAMIN D 25 HYDROXY (VIT D DEFICIENCY, FRACTURES): Vit D, 25-Hydroxy: 58 ng/mL (ref 30–89)

## 2013-09-20 ENCOUNTER — Ambulatory Visit (INDEPENDENT_AMBULATORY_CARE_PROVIDER_SITE_OTHER): Payer: Medicare Other | Admitting: Emergency Medicine

## 2013-09-20 VITALS — BP 132/78 | HR 64 | Temp 97.9°F | Resp 20 | Wt 188.8 lb

## 2013-09-20 DIAGNOSIS — K3189 Other diseases of stomach and duodenum: Secondary | ICD-10-CM

## 2013-09-20 DIAGNOSIS — R079 Chest pain, unspecified: Secondary | ICD-10-CM

## 2013-09-20 DIAGNOSIS — K30 Functional dyspepsia: Secondary | ICD-10-CM

## 2013-09-20 DIAGNOSIS — R1013 Epigastric pain: Secondary | ICD-10-CM

## 2013-09-20 NOTE — Patient Instructions (Addendum)
Diet for Gastroesophageal Reflux Disease, Adult Reflux (acid reflux) is when acid from your stomach flows up into the esophagus. When acid comes in contact with the esophagus, the acid causes irritation and soreness (inflammation) in the esophagus. When reflux happens often or so severely that it causes damage to the esophagus, it is called gastroesophageal reflux disease (GERD). Nutrition therapy can help ease the discomfort of GERD. FOODS OR DRINKS TO AVOID OR LIMIT  Smoking or chewing tobacco. Nicotine is one of the most potent stimulants to acid production in the gastrointestinal tract.  Caffeinated and decaffeinated coffee and black tea.  Regular or low-calorie carbonated beverages or energy drinks (caffeine-free carbonated beverages are allowed).   Strong spices, such as black pepper, white pepper, red pepper, cayenne, curry powder, and chili powder.  Peppermint or spearmint.  Chocolate.  High-fat foods, including meats and fried foods. Extra added fats including oils, butter, salad dressings, and nuts. Limit these to less than 8 tsp per day.  Fruits and vegetables if they are not tolerated, such as citrus fruits or tomatoes.  Alcohol.  Any food that seems to aggravate your condition. If you have questions regarding your diet, call your caregiver or a registered dietitian. OTHER THINGS THAT MAY HELP GERD INCLUDE:   Eating your meals slowly, in a relaxed setting.  Eating 5 to 6 small meals per day instead of 3 large meals.  Eliminating food for a period of time if it causes distress.  Not lying down until 3 hours after eating a meal.  Keeping the head of your bed raised 6 to 9 inches (15 to 23 cm) by using a foam wedge or blocks under the legs of the bed. Lying flat may make symptoms worse.  Being physically active. Weight loss may be helpful in reducing reflux in overweight or obese adults.  Wear loose fitting clothing EXAMPLE MEAL PLAN This meal plan is approximately  2,000 calories based on https://www.[place].org/ChooseMyPlate.gov meal planning guidelines. Breakfast   cup cooked oatmeal.  1 cup strawberries.  1 cup low-fat milk.  1 oz almonds. Snack  1 cup cucumber slices.  6 oz yogurt (made from low-fat or fat-free milk). Lunch  2 slice whole-wheat bread.  2 oz sliced [place].  2 tsp mayonnaise.  1 cup blueberries.  1 cup snap peas. Snack  6 whole-wheat crackers.  1 oz string cheese. Dinner   cup brown rice.  1 cup mixed veggies.  1 tsp olive oil.  3 oz grilled fish. Document Released: 08/09/2005 Document Revised: 11/01/2011 Document Reviewed: 06/25/2011 [facility] Patient Information 2014 [place], [place]. Chest Pain (Nonspecific) Chest pain has many causes. Your pain could be caused by something serious, such as a heart attack or a blood clot in the lungs. It could also be caused by something less serious, such as a chest bruise or a virus. Follow up with your doctor. More lab tests or other studies may be needed to find the cause of your pain. Most of the time, nonspecific chest pain will improve within 2 to 3 days of rest and mild pain medicine. HOME CARE  For chest bruises, you may put ice on the sore area for 15-20 minutes, 03-04 times a day. Do this only if it makes you feel better.  Put ice in a plastic bag.  Place a towel between the skin and the bag.  Rest for the next 2 to 3 days.  Go back to work if the pain improves.  See your doctor if the pain lasts  longer than 1 to 2 weeks.  Only take medicine as told by your doctor.  Quit smoking if you smoke. GET HELP RIGHT AWAY IF:   There is more pain or pain that spreads to the arm, neck, jaw, back, or belly (abdomen).  You have shortness of breath.  You cough more than usual or cough up blood.  You have very bad back or belly pain, feel sick to your stomach (nauseous), or throw up (vomit).  You have very bad weakness.  You pass out (faint).  You have a fever. Any of these  problems may be serious and may be an emergency. Do not wait to see if the problems will go away. Get medical help right away. Call your local emergency services 911 in [place] Do not drive yourself to the hospital. MAKE SURE YOU:   Understand these instructions.  Will watch this condition.  Will get help right away if you or your child is not doing well or gets worse. Document Released: 01/26/2008 Document Revised: 11/01/2011 Document Reviewed: 01/26/2008 [facility] Patient Information 2014 [place], [place].

## 2013-09-20 NOTE — Progress Notes (Addendum)
Subjective:    Patient ID: [name], female    DOB: 04-03-1932, 80 y.o.   MRN: [id number]  HPI Comments: 80 YO FEMALE with hx of indigestion increase vs chest pressure.  She is concerned she may need to have esophagus stretched again. She has been eating more spicy foods ans noticed increase burning and pressure in chest. She is also concerned with heart history of a heart attack but denies any other CV symptoms. She notes the pain is worse in the evening and all food increases symptoms.    Current Outpatient Prescriptions on File Prior to Visit  Medication Sig Dispense Refill  . albuterol (VENTOLIN HFA) 108 (90 BASE) MCG/ACT inhaler Inhale 2 puffs into the lungs every 6 (six) hours as needed. For shortness of breath.      Marland Kitchen aspirin 81 MG EC tablet Take 81 mg by mouth daily.        . Cholecalciferol (VITAMIN D) 2000 UNITS tablet Take 5,000 Units by mouth daily.       Marland Kitchen esomeprazole (NEXIUM) 40 MG capsule Take 40 mg by mouth daily before breakfast.      . furosemide (LASIX) 40 MG tablet Take 40 mg by mouth daily.      . montelukast (SINGULAIR) 10 MG tablet Take 10 mg by mouth daily.        Marland Kitchen olmesartan-hydrochlorothiazide (BENICAR HCT) 20-12.5 MG per tablet Take 1 tablet by mouth daily.        Marland Kitchen PARoxetine (PAXIL) 20 MG tablet Take 20 mg by mouth daily.       . potassium chloride SA (K-DUR,KLOR-CON) 20 MEQ tablet TAKE ONE TABLET BY MOUTH ONE  TIME DAILY  30 tablet  2  . simvastatin (ZOCOR) 80 MG tablet Take 40 mg by mouth. M, W F       . tiotropium (SPIRIVA) 18 MCG inhalation capsule Place 18 mcg into inhaler and inhale daily.        . vitamin B-12 (CYANOCOBALAMIN) 1000 MCG tablet Take 1,000 mcg by mouth daily.       No current facility-administered medications on file prior to visit.   ALLERGIES Advair diskus; Biaxin; Ciprofloxacin; Doxycycline; Keflex; Ketek; Levaquin; Macrobid; Penicillins; and Sulfonamide derivatives  Past Medical History  Diagnosis Date  . AV block,  complete   . CHF (congestive heart failure)   . AAA (abdominal aortic aneurysm)   . Dyslipidemia   . History of colonic polyps   . Anxiety and depression   . Diverticulosis of colon   . Asthma   . Acute renal failure   . Atrial flutter   . Hyperglycemia   . UTI (lower urinary tract infection)   . COPD (chronic obstructive pulmonary disease)   . HTN (hypertension)   . DM (diabetes mellitus)   . GERD (gastroesophageal reflux disease)   . DVT (deep venous thrombosis)   . B12 deficiency      Review of Systems  Respiratory: Positive for chest tightness.   Cardiovascular: Positive for chest pain.  Gastrointestinal:       Indigestion  All other systems reviewed and are negative.   BP 132/78  Pulse 64  Temp(Src) 97.9 F (36.6 C)  Resp 20  Wt 188 lb 12.8 oz (85.639 kg)     Objective:   Physical Exam  Nursing note and vitals reviewed. Constitutional: She is oriented to person, place, and time. She appears well-developed and well-nourished. No distress.  HENT:  Head: Normocephalic and atraumatic.  Right Ear:  External ear normal.  Left Ear: External ear normal.  Nose: Nose normal.  Mouth/Throat: Oropharynx is clear and moist.  Eyes: Conjunctivae and EOM are normal.  Neck: Normal range of motion. Neck supple. No JVD present. No thyromegaly present.  Cardiovascular: Normal rate, regular rhythm, normal heart sounds and intact distal pulses.   Pulmonary/Chest: Effort normal and breath sounds normal.  Abdominal: Soft. Bowel sounds are normal. She exhibits no distension and no mass. There is no tenderness. There is no rebound and no guarding.  Musculoskeletal: Normal range of motion. She exhibits no edema and no tenderness.  Lymphadenopathy:    She has no cervical adenopathy.  Neurological: She is alert and oriented to person, place, and time. No cranial nerve deficit.  Skin: Skin is warm and dry. No rash noted. No erythema. No pallor.  Psychiatric: She has a normal mood and  affect. Her behavior is normal. Judgment and thought content normal.      EKG NSCSPT will send to cardiology for further evaluation/ comparison    Assessment & Plan:  1. CP vs Indigestion of hx of GERD and Esophageal Stretching in past- Switch from Nexium to Dexilant SX # 5 . GERD diet explained. Keep GI appointment Next, w/c if SX increase or ER.

## 2013-09-20 NOTE — Progress Notes (Signed)
Patient ID: [name] MarvelGertrude S Longley, female   DOB: Dec 22, 1931, 80 y.o.   MRN: [id number]

## 2013-09-23 ENCOUNTER — Encounter: Payer: Self-pay | Admitting: Emergency Medicine

## 2013-10-04 ENCOUNTER — Encounter: Payer: Self-pay | Admitting: *Deleted

## 2013-10-15 ENCOUNTER — Other Ambulatory Visit: Payer: Self-pay | Admitting: Internal Medicine

## 2013-10-15 ENCOUNTER — Ambulatory Visit: Payer: Medicare Other | Admitting: Internal Medicine

## 2013-10-19 ENCOUNTER — Other Ambulatory Visit: Payer: Self-pay | Admitting: Internal Medicine

## 2013-10-24 ENCOUNTER — Other Ambulatory Visit: Payer: Self-pay | Admitting: Internal Medicine

## 2013-11-27 ENCOUNTER — Telehealth: Payer: Self-pay

## 2013-11-27 NOTE — Telephone Encounter (Signed)
Make sure she's taking probiotic, eat bland. Ask if any weakness/pain or blood? ER

## 2013-11-27 NOTE — Telephone Encounter (Signed)
Pt called c/o diarrhea x3 days. Please advise.

## 2013-11-28 NOTE — Telephone Encounter (Signed)
Spoke with patient and she declines ER visit.  Does not feel symptoms are that severe.  Advised patient that she may be dehydrated and may need fluids.  Also reminded patient that these symptoms have been reoccurring for months now.  Patient continued to decline ER visit even after noting seeing blood in stool.  I spoke with patient's daughter, [name], and she states she has scheduled GI evaluation three different times for patient and she continued to cancel appointments with [facility].  [name] mentioned patient's family history of colon cancer and her concern for these recurrent symptoms. Please advise.

## 2013-11-28 NOTE — Telephone Encounter (Signed)
Please advise patient she has to follow up at GI with diarrhea on/off for months and family history of cancer.

## 2013-12-04 ENCOUNTER — Emergency Department (HOSPITAL_COMMUNITY): Payer: Medicare Other

## 2013-12-04 ENCOUNTER — Inpatient Hospital Stay (HOSPITAL_COMMUNITY)
Admission: EM | Admit: 2013-12-04 | Discharge: 2013-12-13 | DRG: 280 | Disposition: A | Discharge status: Home Health | Payer: Medicare Other | Attending: Internal Medicine | Admitting: Internal Medicine

## 2013-12-04 ENCOUNTER — Encounter (HOSPITAL_COMMUNITY): Payer: Self-pay | Admitting: Emergency Medicine

## 2013-12-04 DIAGNOSIS — Z86718 Personal history of other venous thrombosis and embolism: Secondary | ICD-10-CM

## 2013-12-04 DIAGNOSIS — Z87891 Personal history of nicotine dependence: Secondary | ICD-10-CM

## 2013-12-04 DIAGNOSIS — D696 Thrombocytopenia, unspecified: Secondary | ICD-10-CM | POA: Diagnosis present

## 2013-12-04 DIAGNOSIS — I442 Atrioventricular block, complete: Secondary | ICD-10-CM

## 2013-12-04 DIAGNOSIS — I129 Hypertensive chronic kidney disease with stage 1 through stage 4 chronic kidney disease, or unspecified chronic kidney disease: Secondary | ICD-10-CM | POA: Diagnosis present

## 2013-12-04 DIAGNOSIS — I214 Non-ST elevation (NSTEMI) myocardial infarction: Secondary | ICD-10-CM | POA: Diagnosis present

## 2013-12-04 DIAGNOSIS — N189 Chronic kidney disease, unspecified: Secondary | ICD-10-CM | POA: Diagnosis present

## 2013-12-04 DIAGNOSIS — E1165 Type 2 diabetes mellitus with hyperglycemia: Secondary | ICD-10-CM

## 2013-12-04 DIAGNOSIS — J9621 Acute and chronic respiratory failure with hypoxia: Secondary | ICD-10-CM | POA: Diagnosis present

## 2013-12-04 DIAGNOSIS — J449 Chronic obstructive pulmonary disease, unspecified: Secondary | ICD-10-CM

## 2013-12-04 DIAGNOSIS — E785 Hyperlipidemia, unspecified: Secondary | ICD-10-CM | POA: Diagnosis present

## 2013-12-04 DIAGNOSIS — E869 Volume depletion, unspecified: Secondary | ICD-10-CM

## 2013-12-04 DIAGNOSIS — E1121 Type 2 diabetes mellitus with diabetic nephropathy: Secondary | ICD-10-CM | POA: Diagnosis present

## 2013-12-04 DIAGNOSIS — Z9981 Dependence on supplemental oxygen: Secondary | ICD-10-CM

## 2013-12-04 DIAGNOSIS — E119 Type 2 diabetes mellitus without complications: Secondary | ICD-10-CM

## 2013-12-04 DIAGNOSIS — I5033 Acute on chronic diastolic (congestive) heart failure: Principal | ICD-10-CM | POA: Diagnosis present

## 2013-12-04 DIAGNOSIS — N390 Urinary tract infection, site not specified: Secondary | ICD-10-CM | POA: Diagnosis present

## 2013-12-04 DIAGNOSIS — D649 Anemia, unspecified: Secondary | ICD-10-CM | POA: Diagnosis not present

## 2013-12-04 DIAGNOSIS — J9620 Acute and chronic respiratory failure, unspecified whether with hypoxia or hypercapnia: Secondary | ICD-10-CM | POA: Diagnosis present

## 2013-12-04 DIAGNOSIS — Z950 Presence of cardiac pacemaker: Secondary | ICD-10-CM

## 2013-12-04 DIAGNOSIS — K219 Gastro-esophageal reflux disease without esophagitis: Secondary | ICD-10-CM | POA: Diagnosis present

## 2013-12-04 DIAGNOSIS — E538 Deficiency of other specified B group vitamins: Secondary | ICD-10-CM | POA: Diagnosis present

## 2013-12-04 DIAGNOSIS — J189 Pneumonia, unspecified organism: Secondary | ICD-10-CM | POA: Diagnosis present

## 2013-12-04 DIAGNOSIS — E860 Dehydration: Secondary | ICD-10-CM | POA: Diagnosis present

## 2013-12-04 DIAGNOSIS — I4892 Unspecified atrial flutter: Secondary | ICD-10-CM

## 2013-12-04 DIAGNOSIS — I509 Heart failure, unspecified: Secondary | ICD-10-CM | POA: Diagnosis present

## 2013-12-04 DIAGNOSIS — Z79899 Other long term (current) drug therapy: Secondary | ICD-10-CM

## 2013-12-04 DIAGNOSIS — A498 Other bacterial infections of unspecified site: Secondary | ICD-10-CM | POA: Diagnosis present

## 2013-12-04 DIAGNOSIS — I10 Essential (primary) hypertension: Secondary | ICD-10-CM

## 2013-12-04 DIAGNOSIS — Z7982 Long term (current) use of aspirin: Secondary | ICD-10-CM

## 2013-12-04 DIAGNOSIS — I4891 Unspecified atrial fibrillation: Secondary | ICD-10-CM | POA: Diagnosis present

## 2013-12-04 DIAGNOSIS — IMO0001 Reserved for inherently not codable concepts without codable children: Secondary | ICD-10-CM | POA: Diagnosis present

## 2013-12-04 DIAGNOSIS — IMO0002 Reserved for concepts with insufficient information to code with codable children: Secondary | ICD-10-CM

## 2013-12-04 DIAGNOSIS — J441 Chronic obstructive pulmonary disease with (acute) exacerbation: Secondary | ICD-10-CM | POA: Diagnosis present

## 2013-12-04 LAB — COMPREHENSIVE METABOLIC PANEL
ALT: 6 U/L (ref 0–35)
AST: 25 U/L (ref 0–37)
Albumin: 3.4 g/dL — ABNORMAL LOW (ref 3.5–5.2)
Alkaline Phosphatase: 65 U/L (ref 39–117)
BUN: 26 mg/dL — ABNORMAL HIGH (ref 6–23)
CALCIUM: 9.1 mg/dL (ref 8.4–10.5)
CHLORIDE: 99 meq/L (ref 96–112)
CO2: 25 meq/L (ref 19–32)
Creatinine, Ser: 1.57 mg/dL — ABNORMAL HIGH (ref 0.50–1.10)
GFR calc Af Amer: 35 mL/min — ABNORMAL LOW (ref >90)
GFR, EST NON AFRICAN AMERICAN: 30 mL/min — AB (ref >90)
Glucose, Bld: 200 mg/dL — ABNORMAL HIGH (ref 70–99)
Potassium: 4 mEq/L (ref 3.7–5.3)
SODIUM: 143 meq/L (ref 137–147)
Total Bilirubin: 0.7 mg/dL (ref 0.3–1.2)
Total Protein: 6.8 g/dL (ref 6.0–8.3)

## 2013-12-04 LAB — URINE MICROSCOPIC-ADD ON

## 2013-12-04 LAB — URINALYSIS, ROUTINE W REFLEX MICROSCOPIC
BILIRUBIN URINE: NEGATIVE
GLUCOSE, UA: NEGATIVE mg/dL
Ketones, ur: NEGATIVE mg/dL
Leukocytes, UA: NEGATIVE
NITRITE: NEGATIVE
Protein, ur: 30 mg/dL — AB
Specific Gravity, Urine: 1.02 (ref 1.005–1.030)
Urobilinogen, UA: 0.2 mg/dL (ref 0.0–1.0)
pH: 5 (ref 5.0–8.0)

## 2013-12-04 LAB — I-STAT ARTERIAL BLOOD GAS, ED
Acid-Base Excess: 5 mmol/L — ABNORMAL HIGH (ref 0.0–2.0)
Bicarbonate: 31.1 mEq/L — ABNORMAL HIGH (ref 20.0–24.0)
O2 Saturation: 99 %
PCO2 ART: 55.7 mmHg — AB (ref 35.0–45.0)
Patient temperature: 101.1
TCO2: 33 mmol/L (ref 0–100)
pH, Arterial: 7.362 (ref 7.350–7.450)
pO2, Arterial: 137 mmHg — ABNORMAL HIGH (ref 80.0–100.0)

## 2013-12-04 LAB — I-STAT CG4 LACTIC ACID, ED: Lactic Acid, Venous: 2.18 mmol/L (ref 0.5–2.2)

## 2013-12-04 MED ORDER — ALBUTEROL (5 MG/ML) CONTINUOUS INHALATION SOLN
10.0000 mg/h | INHALATION_SOLUTION | Freq: Once | RESPIRATORY_TRACT | Status: AC
  Administered 2013-12-04: 10 mg/h via RESPIRATORY_TRACT
  Filled 2013-12-04: qty 20

## 2013-12-04 MED ORDER — FUROSEMIDE 10 MG/ML IJ SOLN
40.0000 mg | Freq: Once | INTRAMUSCULAR | Status: AC
  Administered 2013-12-05: 40 mg via INTRAVENOUS
  Filled 2013-12-04: qty 4

## 2013-12-04 MED ORDER — DEXTROSE 5 % IV SOLN
500.0000 mg | Freq: Once | INTRAVENOUS | Status: AC
  Administered 2013-12-05: 500 mg via INTRAVENOUS

## 2013-12-04 MED ORDER — METOCLOPRAMIDE HCL 5 MG/ML IJ SOLN
10.0000 mg | Freq: Once | INTRAMUSCULAR | Status: AC
  Administered 2013-12-05: 10 mg via INTRAVENOUS
  Filled 2013-12-04: qty 2

## 2013-12-04 MED ORDER — DEXTROSE 5 % IV SOLN
2.0000 g | Freq: Once | INTRAVENOUS | Status: AC
  Administered 2013-12-05: 2 g via INTRAVENOUS
  Filled 2013-12-04: qty 2

## 2013-12-04 NOTE — ED Notes (Signed)
Pt from home by EMS- sts found with resp distress.  Pt placed on cpap -sats 78 prior to that.  Pt tolerated cpap sats now 92.  duonebsx 2 and solumedrol 125mg  given enroute by ems.

## 2013-12-04 NOTE — ED Notes (Signed)
Pt placed on bipap.  Dr. Clearance CootsHarper and Dr. Anitra LauthPlunkett at bedside to see pt.

## 2013-12-04 NOTE — ED Provider Notes (Signed)
CSN: [id number]     Arrival date & time 12/04/13  2219 History   First MD Initiated Contact with Patient 12/04/13 2228     Chief Complaint  Patient presents with  . Respiratory Distress     (Consider location/radiation/quality/duration/timing/severity/associated sxs/prior Treatment) HPI  [name] is a 80 y.o. female with a PMH of O2 dependent COPD (5L at home, daughter states sometimes 6 L), CHF (EF of 50-55% last echo in 2012), HTN, Afib, pacemaker, GERD who presents today via EMS with dyspnea.  Per son in law, pt was not answering her phone, so he went to check on her.  Pt was in bed having trouble breathing.  EMS was called.  Upon arrival pt was satting in the low 70s on RA.  Pt was given 2 duonebs, given IV solumedrol, placed on BiPAP en transit with improvement of condition.  On arrival, pt complains of dyspnea as well as cough.  Pt endorses cough for over a week at home.  Productive of brownish sputum.  Alleviated but not resolved with home treatments.  Pt denies CP, abdominal pain, nausea, vomiting, dysuria, hematuria, diarrhea, constipation, hematochezia, or melena.   Past Medical History  Diagnosis Date  . AV block, complete   . CHF (congestive heart failure)   . AAA (abdominal aortic aneurysm)   . Dyslipidemia   . History of colonic polyps   . Anxiety and depression   . Diverticulosis of colon   . Asthma   . Acute renal failure   . Atrial flutter   . Hyperglycemia   . UTI (lower urinary tract infection)   . COPD (chronic obstructive pulmonary disease)   . HTN (hypertension)   . DM (diabetes mellitus)   . GERD (gastroesophageal reflux disease)   . DVT (deep venous thrombosis)   . B12 deficiency   . Angiodysplasia of intestine (without mention of hemorrhage)    Past Surgical History  Procedure Laterality Date  . Tubal ligation    . Abdominal hysterectomy    . Appendectomy    . Pacemaker insertion      medtronic   . Colonoscopy  02/06/2002    213.08,[id number],562.10   . Bladder suspension     Family History  Problem Relation Age of Onset  . Emphysema Maternal Uncle     multiple   . Asthma Daughter   . Colon cancer Sister     alive at 8680   . Heart disease Father   . CVA Mother   . Heart disease Mother    History  Substance Use Topics  . Smoking status: Former Smoker    Quit date: 08/23/1988  . Smokeless tobacco: Never Used     Comment: quit in 1990, smoked 1 ppd x 30 years   . Alcohol Use: No   OB History   Grav Para Term Preterm Abortions TAB SAB Ect Mult Living                 Review of Systems  Constitutional: Negative for fever and chills.  HENT: Negative for facial swelling.   Eyes: Negative for photophobia and pain.  Respiratory: Positive for cough and shortness of breath.   Cardiovascular: Negative for chest pain and leg swelling.  Gastrointestinal: Negative for nausea, vomiting and abdominal pain.  Genitourinary: Negative for dysuria.  Musculoskeletal: Negative for arthralgias.  Skin: Negative for rash and wound.  Neurological: Negative for seizures.  Hematological: Negative for adenopathy.      Allergies  Advair diskus; Biaxin;  Ciprofloxacin; Doxycycline; Keflex; Ketek; Levaquin; Macrobid; Penicillins; and Sulfonamide derivatives  Home Medications   Prior to Admission medications   Medication Sig Start Date End Date Taking? Authorizing Provider  albuterol (VENTOLIN HFA) 108 (90 BASE) MCG/ACT inhaler Inhale 2 puffs into the lungs every 6 (six) hours as needed. For shortness of breath.   Yes Historical Provider, MD  aspirin 81 MG EC tablet Take 81 mg by mouth daily.     Yes Historical Provider, MD  azithromycin (ZITHROMAX) 250 MG tablet Take 1 tablet by mouth daily. 11/29/13  Yes Historical Provider, MD  Cholecalciferol (VITAMIN D) 2000 UNITS tablet Take 5,000 Units by mouth daily.    Yes Historical Provider, MD  esomeprazole (NEXIUM) 40 MG capsule Take 40 mg by mouth daily before breakfast.   Yes Historical Provider, MD   furosemide (LASIX) 40 MG tablet Take 40 mg by mouth daily. 08/11/12  Yes Marinus MawGregg W Taylor, MD  montelukast (SINGULAIR) 10 MG tablet Take 10 mg by mouth daily.     Yes Historical Provider, MD  predniSONE (DELTASONE) 10 MG tablet Take 10 mg by mouth daily with breakfast.   Yes Historical Provider, MD  tiotropium (SPIRIVA) 18 MCG inhalation capsule Place 18 mcg into inhaler and inhale daily.     Yes Historical Provider, MD  vitamin B-12 (CYANOCOBALAMIN) 1000 MCG tablet Take 1,000 mcg by mouth daily.   Yes Historical Provider, MD   BP 126/64  Pulse 92  Temp(Src) 101.1 F (38.4 C)  Resp 27  SpO2 97% Physical Exam  Constitutional: She is oriented to person, place, and time. She appears well-developed and well-nourished. No distress.  HENT:  Head: Normocephalic and atraumatic.  Mouth/Throat: No oropharyngeal exudate.  Eyes: Conjunctivae are normal. Pupils are equal, round, and reactive to light. No scleral icterus.  Neck: Normal range of motion. No tracheal deviation present. No thyromegaly present.  Cardiovascular: Normal rate, regular rhythm and normal heart sounds.  Exam reveals no gallop and no friction rub.   No murmur heard. Pulmonary/Chest: No stridor. She is in respiratory distress (tachypneic, supraclavicular retractions). She has wheezes. She has rales. She exhibits no tenderness.  Abdominal: Soft. She exhibits no distension and no mass. There is no tenderness. There is no rebound and no guarding.  Musculoskeletal: Normal range of motion. She exhibits no edema.  Neurological: She is alert and oriented to person, place, and time.  Skin: Skin is warm and dry. She is not diaphoretic.    ED Course  Procedures (including critical care time) Labs Review Labs Reviewed  I-STAT ARTERIAL BLOOD GAS, ED - Abnormal; Notable for the following:    pCO2 arterial 55.7 (*)    pO2, Arterial 137.0 (*)    Bicarbonate 31.1 (*)    Acid-Base Excess 5.0 (*)    All other components within normal limits   CULTURE, BLOOD (ROUTINE X 2)  CULTURE, BLOOD (ROUTINE X 2)  URINE CULTURE  URINALYSIS, ROUTINE W REFLEX MICROSCOPIC  TROPONIN I  PRO B NATRIURETIC PEPTIDE  COMPREHENSIVE METABOLIC PANEL  CBC WITH DIFFERENTIAL  I-STAT CG4 LACTIC ACID, ED    Imaging Review Dg Chest Port 1 View  12/04/2013   CLINICAL DATA:  Respiratory distress.  EXAM: PORTABLE CHEST - 1 VIEW  COMPARISON:  CT CHEST W/O CM dated 08/16/2011; DG CHEST 1V PORT dated 08/14/2011  FINDINGS: Advanced COPD again noted. There may be subtle infiltrates in the right upper lobe and right lower lobe. Parenchymal scarring is present bilaterally. No edema, pneumothorax or pleural effusion is identified.  The heart size is normal. There is a stable appearance to a biventricular pacer.  IMPRESSION: Advanced COPD with potential infiltrates in the right upper lobe and right lower lobe.   Electronically Signed   By: Irish Lack M.D.   On: 12/04/2013 23:44     EKG Interpretation   Date/Time:  Tuesday December 04 2013 22:44:17 EDT Ventricular Rate:  95 PR Interval:  135 QRS Duration: 137 QT Interval:  426 QTC Calculation: 536 R Axis:   -100 Text Interpretation:  Age not entered, assumed to be  80 years old for  purpose of ECG interpretation Sinus rhythm IVCD, consider atypical RBBB  Inferior infarct, old Abnormal lateral Q waves Probable anterior infarct,  age indeterminate Baseline wander in lead(s) I II III aVR aVL aVF V1 V2 V3      MDM   Final diagnoses:  None    ERIC MORGANTI is a 80 y.o. female with a PMH of O2 dependent COPD (5L at home, daughter states sometimes 6 L), CHF (EF of 50-55% last echo in 2012), HTN, Afib, pacemaker, GERD who presents today via EMS with dyspnea.  Per son in law, pt was not answering her phone, so he went to check on her.  Pt was in bed having trouble breathing.  EMS was called.  Upon arrival pt was satting in the low 70s on RA.  Pt was given 2 duonebs, given IV solumedrol, placed on BiPAP en  transit with improvement of condition.  On arrival, pt complains of dyspnea as well as cough.  Pt endorses cough for over a week at home.  Productive of brownish sputum.  Alleviated but not resolved with home treatments.  Pt denies CP, abdominal pain, nausea, vomiting, dysuria, hematuria, diarrhea, constipation, hematochezia, or melena.   Exam as above with tachypnea, retractions, wheezing, rales.  Pt requiring BiPap at this time.  The DDx is broad at this time and includes CHF exacerbation, PNA, COPD exacerbation, ACS, PE, PTX, pericarditis, tamponade, dissection, esophageal pathology, AKI.   Considering Hx, at this time I suspect CHF exacerbation vs COPD exacerbation vs PNA.  Considering fever, I have ordered cultures as well as basic labs, CXR.  Will administer 40 mg lasix.  CXR with infiltrates.  Pt is hypercarbic.  She has leukocytosis.  This appears to be more of a PNA picture.  Will treat for CAP, continue BiPap considering hypercarbia.   She also has significant increase in her BNP, indicating possible CHF exacerbation component.  Her troponin is .3 which is where past troponins have been.    Will admit to step down under triad service.  They have requested I call cards.  Cards requests we interrogate ICD.  Nursing interrogating at this time.    Nursing unable to interrogate device.  [employer] rep has been contacted and states her device is too old for our machine to interrogate.  We have asked rep to come interrogate device tonight.  Okey Regal (rep) will present to step down unit tonight in order to interrogate device.  Pt being admitted to the step down unit in stable condition.    I have discussed case and care has been guided by my attending physician, Dr. Anitra Lauth.  Loma Boston, MD 12/05/13 [phone]

## 2013-12-05 DIAGNOSIS — R7989 Other specified abnormal findings of blood chemistry: Secondary | ICD-10-CM

## 2013-12-05 DIAGNOSIS — I214 Non-ST elevation (NSTEMI) myocardial infarction: Secondary | ICD-10-CM | POA: Diagnosis present

## 2013-12-05 DIAGNOSIS — I5023 Acute on chronic systolic (congestive) heart failure: Secondary | ICD-10-CM

## 2013-12-05 DIAGNOSIS — J9621 Acute and chronic respiratory failure with hypoxia: Secondary | ICD-10-CM | POA: Diagnosis present

## 2013-12-05 DIAGNOSIS — J189 Pneumonia, unspecified organism: Secondary | ICD-10-CM | POA: Diagnosis present

## 2013-12-05 DIAGNOSIS — J9620 Acute and chronic respiratory failure, unspecified whether with hypoxia or hypercapnia: Secondary | ICD-10-CM

## 2013-12-05 DIAGNOSIS — I509 Heart failure, unspecified: Secondary | ICD-10-CM

## 2013-12-05 LAB — CBC WITH DIFFERENTIAL/PLATELET
BASOS PCT: 0 % (ref 0–1)
Basophils Absolute: 0 10*3/uL (ref 0.0–0.1)
EOS PCT: 0 % (ref 0–5)
Eosinophils Absolute: 0 10*3/uL (ref 0.0–0.7)
HEMATOCRIT: 39.3 % (ref 36.0–46.0)
Hemoglobin: 12.4 g/dL (ref 12.0–15.0)
Lymphocytes Relative: 9 % — ABNORMAL LOW (ref 12–46)
Lymphs Abs: 1.5 10*3/uL (ref 0.7–4.0)
MCH: 31.1 pg (ref 26.0–34.0)
MCHC: 31.6 g/dL (ref 30.0–36.0)
MCV: 98.5 fL (ref 78.0–100.0)
MONO ABS: 0.6 10*3/uL (ref 0.1–1.0)
MONOS PCT: 4 % (ref 3–12)
NEUTROS PCT: 87 % — AB (ref 43–77)
Neutro Abs: 14 10*3/uL — ABNORMAL HIGH (ref 1.7–7.7)
Platelets: 112 10*3/uL — ABNORMAL LOW (ref 150–400)
RBC: 3.99 MIL/uL (ref 3.87–5.11)
RDW: 13.6 % (ref 11.5–15.5)
WBC: 16.2 10*3/uL — ABNORMAL HIGH (ref 4.0–10.5)

## 2013-12-05 LAB — TROPONIN I
Troponin I: 0.37 ng/mL (ref <0.30)
Troponin I: <0.3 ng/mL (ref <0.30)
Troponin I: <0.3 ng/mL (ref <0.30)

## 2013-12-05 LAB — BASIC METABOLIC PANEL
BUN: 30 mg/dL — AB (ref 6–23)
CALCIUM: 9.1 mg/dL (ref 8.4–10.5)
CO2: 19 mEq/L (ref 19–32)
Chloride: 94 mEq/L — ABNORMAL LOW (ref 96–112)
Creatinine, Ser: 1.76 mg/dL — ABNORMAL HIGH (ref 0.50–1.10)
GFR calc Af Amer: 30 mL/min — ABNORMAL LOW (ref >90)
GFR, EST NON AFRICAN AMERICAN: 26 mL/min — AB (ref >90)
Glucose, Bld: 401 mg/dL — ABNORMAL HIGH (ref 70–99)
POTASSIUM: 4.1 meq/L (ref 3.7–5.3)
SODIUM: 138 meq/L (ref 137–147)

## 2013-12-05 LAB — BLOOD GAS, ARTERIAL
Acid-base deficit: 1.6 mmol/L (ref 0.0–2.0)
Bicarbonate: 23.5 mEq/L (ref 20.0–24.0)
Delivery systems: POSITIVE
Drawn by: 40415
EXPIRATORY PAP: 8
FIO2: 0.5 %
Inspiratory PAP: 16
LHR: 15 {breaths}/min
Mode: POSITIVE
O2 Saturation: 93.6 %
PH ART: 7.327 — AB (ref 7.350–7.450)
PO2 ART: 73.3 mmHg — AB (ref 80.0–100.0)
Patient temperature: 98.6
TCO2: 24.9 mmol/L (ref 0–100)
pCO2 arterial: 46.2 mmHg — ABNORMAL HIGH (ref 35.0–45.0)

## 2013-12-05 LAB — CBC
HCT: 38.9 % (ref 36.0–46.0)
Hemoglobin: 12.1 g/dL (ref 12.0–15.0)
MCH: 31.1 pg (ref 26.0–34.0)
MCHC: 31.1 g/dL (ref 30.0–36.0)
MCV: 100 fL (ref 78.0–100.0)
Platelets: 109 10*3/uL — ABNORMAL LOW (ref 150–400)
RBC: 3.89 MIL/uL (ref 3.87–5.11)
RDW: 14 % (ref 11.5–15.5)
WBC: 14.8 10*3/uL — ABNORMAL HIGH (ref 4.0–10.5)

## 2013-12-05 LAB — GLUCOSE, CAPILLARY
GLUCOSE-CAPILLARY: 342 mg/dL — AB (ref 70–99)
Glucose-Capillary: 244 mg/dL — ABNORMAL HIGH (ref 70–99)

## 2013-12-05 LAB — PRO B NATRIURETIC PEPTIDE: Pro B Natriuretic peptide (BNP): 17479 pg/mL — ABNORMAL HIGH (ref 0–450)

## 2013-12-05 LAB — HEMOGLOBIN A1C
Hgb A1c MFr Bld: 7.1 % — ABNORMAL HIGH (ref <5.7)
Mean Plasma Glucose: 157 mg/dL — ABNORMAL HIGH (ref <117)

## 2013-12-05 LAB — MRSA PCR SCREENING: MRSA by PCR: NEGATIVE

## 2013-12-05 MED ORDER — IPRATROPIUM BROMIDE 0.02 % IN SOLN
0.5000 mg | Freq: Four times a day (QID) | RESPIRATORY_TRACT | Status: DC
  Administered 2013-12-05 (×4): 0.5 mg via RESPIRATORY_TRACT
  Filled 2013-12-05 (×4): qty 2.5

## 2013-12-05 MED ORDER — DEXTROSE 5 % IV SOLN
250.0000 mg | INTRAVENOUS | Status: DC
  Administered 2013-12-06 (×2): 250 mg via INTRAVENOUS
  Filled 2013-12-05 (×2): qty 250

## 2013-12-05 MED ORDER — METHYLPREDNISOLONE SODIUM SUCC 125 MG IJ SOLR
60.0000 mg | Freq: Two times a day (BID) | INTRAMUSCULAR | Status: DC
  Administered 2013-12-06 – 2013-12-07 (×3): 60 mg via INTRAVENOUS
  Filled 2013-12-05 (×5): qty 0.96

## 2013-12-05 MED ORDER — INSULIN ASPART 100 UNIT/ML ~~LOC~~ SOLN
0.0000 [IU] | Freq: Three times a day (TID) | SUBCUTANEOUS | Status: DC
  Administered 2013-12-06: 5 [IU] via SUBCUTANEOUS
  Administered 2013-12-06: 3 [IU] via SUBCUTANEOUS
  Administered 2013-12-06: 5 [IU] via SUBCUTANEOUS
  Administered 2013-12-07: 3 [IU] via SUBCUTANEOUS
  Administered 2013-12-07: 5 [IU] via SUBCUTANEOUS
  Administered 2013-12-07: 9 [IU] via SUBCUTANEOUS
  Administered 2013-12-08: 5 [IU] via SUBCUTANEOUS
  Administered 2013-12-08: 7 [IU] via SUBCUTANEOUS
  Administered 2013-12-09 (×3): 3 [IU] via SUBCUTANEOUS
  Administered 2013-12-10: 2 [IU] via SUBCUTANEOUS
  Administered 2013-12-10 (×2): 3 [IU] via SUBCUTANEOUS
  Administered 2013-12-11: 2 [IU] via SUBCUTANEOUS
  Administered 2013-12-11: 3 [IU] via SUBCUTANEOUS
  Administered 2013-12-12: 1 [IU] via SUBCUTANEOUS

## 2013-12-05 MED ORDER — SODIUM CHLORIDE 0.9 % IJ SOLN
3.0000 mL | Freq: Two times a day (BID) | INTRAMUSCULAR | Status: DC
  Administered 2013-12-05: 3 mL via INTRAVENOUS

## 2013-12-05 MED ORDER — METHYLPREDNISOLONE SODIUM SUCC 125 MG IJ SOLR
80.0000 mg | Freq: Two times a day (BID) | INTRAMUSCULAR | Status: DC
  Administered 2013-12-05 (×2): 80 mg via INTRAVENOUS
  Filled 2013-12-05 (×3): qty 1.28

## 2013-12-05 MED ORDER — IPRATROPIUM-ALBUTEROL 0.5-2.5 (3) MG/3ML IN SOLN: 3.0000 mL | Freq: Four times a day (QID) | RESPIRATORY_TRACT | Status: DC

## 2013-12-05 MED ORDER — INSULIN ASPART 100 UNIT/ML ~~LOC~~ SOLN
0.0000 [IU] | SUBCUTANEOUS | Status: DC
  Administered 2013-12-05: 3 [IU] via SUBCUTANEOUS

## 2013-12-05 MED ORDER — SODIUM CHLORIDE 0.9 % IV SOLN: 250.0000 mL | INTRAVENOUS | Status: DC | PRN

## 2013-12-05 MED ORDER — ALBUTEROL SULFATE (2.5 MG/3ML) 0.083% IN NEBU
2.5000 mg | INHALATION_SOLUTION | Freq: Four times a day (QID) | RESPIRATORY_TRACT | Status: DC
  Administered 2013-12-05 (×4): 2.5 mg via RESPIRATORY_TRACT
  Filled 2013-12-05 (×4): qty 3

## 2013-12-05 MED ORDER — ONDANSETRON HCL 4 MG/2ML IJ SOLN: 4.0000 mg | Freq: Four times a day (QID) | INTRAMUSCULAR | Status: DC | PRN

## 2013-12-05 MED ORDER — SODIUM CHLORIDE 0.9 % IJ SOLN: 3.0000 mL | INTRAMUSCULAR | Status: DC | PRN

## 2013-12-05 MED ORDER — ASPIRIN EC 81 MG PO TBEC
81.0000 mg | DELAYED_RELEASE_TABLET | Freq: Every day | ORAL | Status: DC
  Administered 2013-12-05 – 2013-12-13 (×9): 81 mg via ORAL
  Filled 2013-12-05 (×9): qty 1

## 2013-12-05 MED ORDER — FUROSEMIDE 10 MG/ML IJ SOLN
40.0000 mg | Freq: Two times a day (BID) | INTRAMUSCULAR | Status: DC
  Administered 2013-12-05 – 2013-12-06 (×2): 40 mg via INTRAVENOUS
  Filled 2013-12-05 (×4): qty 4

## 2013-12-05 MED ORDER — ENOXAPARIN SODIUM 40 MG/0.4ML ~~LOC~~ SOLN
40.0000 mg | SUBCUTANEOUS | Status: DC
  Administered 2013-12-05: 40 mg via SUBCUTANEOUS
  Filled 2013-12-05 (×2): qty 0.4

## 2013-12-05 MED ORDER — ONDANSETRON HCL 4 MG PO TABS: 4.0000 mg | ORAL_TABLET | Freq: Four times a day (QID) | ORAL | Status: DC | PRN

## 2013-12-05 MED ORDER — DEXTROSE 5 % IV SOLN
1.0000 g | INTRAVENOUS | Status: AC
  Administered 2013-12-05 – 2013-12-11 (×7): 1 g via INTRAVENOUS
  Filled 2013-12-05 (×9): qty 10

## 2013-12-05 NOTE — Progress Notes (Signed)
[facility]  Salvatore MarvelGertrude S Dieppa WUJ:[id number] DOB: 23-Jul-1932 DOA: 12/04/2013 PCP: [name],[name], MD  Time spent : 25+ minutes  Brief narrative: 80 year old female patient with multiple medical problems. Chronically on 5 L nasal cannula oxygen at home for her COPD. She also has a history of chronic kidney disease as well as prior CHF and history of permanent pacemaker. On the date of admission her children had been attempting to contact her by phone for several hours but when they were unable to reach her they sent someone to check on the patient. Apparently on the morning of admission she was fine. The person that was sent to check on the patient found her at home on room air and having difficulty breathing noting patient is supposed to be on chronic oxygen. EMS was called to the home and upon their assessment her oxygen saturations were in the 70s. She was immediately placed on BiPAP and brought to the emergency department. Family members confirm patient has been having upper respiratory symptoms consisting of progressive coughing and progressive difficulty breathing. After the above treatment measures were initiated patient became more responsive and alert and reported her shortness of breath had improved. She was also noted to be febrile in the emergency department. Her white count was elevated at 16,200 and a pro BNP was greater than 17,000 without a baseline for comparison.  HPI/Subjective: Patient is seen for followup visit  Assessment/Plan:  Acute on chronic respiratory failure with hypoxia:   A) COPD (chronic obstructive pulmonary disease)   B) PNA (pneumonia) / right upper and lower lobe infiltrates   C) Possible Congestive heart failure, unspecified -Had similar presentation during hospitalization in 2012 - required intubation at that time -Currently has improved so we'll attempt to wean BiPAP -No wheezing therefore COPD seems  compensated-ABGs revealed elevation in CO2 but normal pH so appears compensated as well -Last echocardiogram 2012 revealed mild RV dilatation with preserved LV function and no evidence of diastolic dysfunction-given underlying COPD and likely cor pulmonale we'll repeat echo to see if has progressed or has developed pulmonary hypertension -Continue supportive care including nebulizers -Continue empiric antibiotic -Patient on chronic prednisone as well as Singulair -chest x-ray was more consistent with pneumonia   DM  -CBGs poorly controlled and most recent early a.m. was 342 and likely due to IV steroids -Not on diabetic meds prior to admission -provide sliding scale insulin -check HgbA1c  ?NSTEMI (non-ST elevated myocardial infarction) -Point-of-care troponin mildly elevated at 0.37 but subsequent troponins have been normal  PACEMAKER - [place] -Followed by Dr. Sharrell KuGreg Taylor as an outpatient -Recent evaluation January 2015 revealed left ventricular lead turned off due to malfunction/inability to program -Dr. Ladona Ridgelaylor felt patient's multifactorial dyspnea might be related to desynchronous RV pacing so plan was to review angios and consider potentially proceeding with LV lead revision  DVT prophylaxis: Lovenox Code Status: Full Family Communication: No family at bedside Disposition Plan/Expected LOS: Stepdown  Consultants: None  Procedures: 2-D echocardiogram - pending  Antibiotics: Zithromax 4/14 >>> Rocephin 4/14 >>>  Objective: Blood pressure 109/59, pulse 73, temperature 97.7 F (36.5 C), temperature source Axillary, resp. rate 21, height 5\' 3"  (1.6 m), weight 186 lb 1.1 oz (84.4 kg), SpO2 94.00%.  Intake/Output Summary (Last 24 hours) at 12/05/13 1444 Last data filed at 12/05/13 1121  Gross per 24 hour  Intake      3 ml  Output    425 ml  Net   -  422 ml   Exam: Followup exam completed. Patient admitted at 12:58 AM today  Scheduled Meds:  Scheduled Meds: . albuterol   2.5 mg Nebulization Q6H  . aspirin EC  81 mg Oral Daily  . [START ON 12/06/2013] azithromycin  250 mg Intravenous Q24H  . cefTRIAXone (ROCEPHIN)  IV  1 g Intravenous Q24H  . enoxaparin (LOVENOX) injection  40 mg Subcutaneous Q24H  . furosemide  40 mg Intravenous Q12H  . ipratropium  0.5 mg Nebulization Q6H  . methylPREDNISolone (SOLU-MEDROL) injection  80 mg Intravenous Q12H  . sodium chloride  3 mL Intravenous Q12H   Data Reviewed: Basic Metabolic Panel:  Recent Labs Lab 12/04/13 2258 12/05/13 0441  NA 143 138  K 4.0 4.1  CL 99 94*  CO2 25 19  GLUCOSE 200* 401*  BUN 26* 30*  CREATININE 1.57* 1.76*  CALCIUM 9.1 9.1   Liver Function Tests:  Recent Labs Lab 12/04/13 2258  AST 25  ALT 6  ALKPHOS 65  BILITOT 0.7  PROT 6.8  ALBUMIN 3.4*   CBC:  Recent Labs Lab 12/04/13 2258 12/05/13 0441  WBC 16.2* 14.8*  NEUTROABS 14.0*  --   HGB 12.4 12.1  HCT 39.3 38.9  MCV 98.5 100.0  PLT 112* 109*   Cardiac Enzymes:  Recent Labs Lab 12/04/13 2258 12/05/13 0441 12/05/13 0810  TROPONINI 0.37* <0.30 <0.30   BNP (last 3 results)  Recent Labs  12/04/13 2258  PROBNP 17479.0*   CBG:  Recent Labs Lab 12/05/13 0818  GLUCAP 342*    Recent Results (from the past 240 hour(s))  MRSA PCR SCREENING     Status: None   Collection Time    12/05/13  6:30 AM      Result Value Ref Range Status   MRSA by PCR NEGATIVE  NEGATIVE Final   Comment:            The GeneXpert MRSA Assay (FDA     approved for NASAL specimens     only), is one component of a     comprehensive MRSA colonization     surveillance program. It is not     intended to diagnose MRSA     infection nor to guide or     monitor treatment for     MRSA infections.     Studies:  Recent x-ray studies have been reviewed in detail by the Attending Physician       Junious SilkAllison Ellis, ANP [facility] Office  [phone] Pager [phone]  **If unable to reach the above provider after paging  please contact the Flow Manager @ [phone]  On-Call/Text Page:      [email or web address]      password [email or web address]  If 7PM-7AM, please contact night-coverage [email or web address] Password [email or web address] 12/05/2013, 2:44 PM   LOS: 1 day   I have personally examined this patient and reviewed the entire database. I have reviewed the above note, made any necessary editorial changes, and agree with its content.  Lonia BloodJeffrey T. Sacred Roa, MD [facility]

## 2013-12-05 NOTE — Consult Note (Signed)
Cardiology Consult Note Alesia Richards, MD No ref. provider found  Reason for consult: positive troponin  History of Present Illness (and review of medical records): [name] is a 80 y.o. female who presents for evaluation of shortness of breath.  She has been seen by Dr. Lovena Le with hx of chronic systolic heart failure, complete heart block s/p PPM, and hypertension  She also has advanced COPD on home oxygen.  Her family reports that she was not answering her phone today.  When her son in law when to check on her she was still in bed and was confused and having difficulty breathing.  EMS was called and patient was hypoxic O2 sats in 56s.  She was given Neb tx, IV solumedrol and placed on BIPAP en route to ED.  Patient family states that she has not felt well over the past week.  She had increased difficulty breathing.  There were no reports of chest pain.  In the ED she remained in resp dist requiring BiPAP, febrile and initial labs revealed ph 7.36/Co2 55. BNP 17479, trop 0.37.  CXR with COPD and possible PNA.  Cardiology was consulted for elevated troponin with concern for NSTEMI.  Echo 08/12/2011 Study Conclusions - Left ventricle: The cavity size was normal. Wall thickness was normal. Systolic function was normal. The estimated ejection fraction was in the range of 50% to 55%. - Right ventricle: The cavity size was mildly dilated. Hypokinesis of the RV apex. - Right atrium: The atrium was moderately dilated. - Atrial septum: The septum bowed from right to left, consistent with increased right atrial pressure. - Pulmonary arteries: Systolic pressure was moderately increased.  Review of Systems Review of systems not obtained due to patient factors.  Patient Active Problem List   Diagnosis Date Noted  . Acute on chronic respiratory failure 12/05/2013  . NSTEMI (non-ST elevated myocardial infarction) 12/05/2013  . PNA (pneumonia) 12/05/2013  . Pneumonia 12/05/2013  .  Unspecified essential hypertension 09/11/2013  . Mixed hyperlipidemia 09/11/2013  . Vitamin D Deficiency 09/11/2013  . HTN (hypertension)   . COPD (chronic obstructive pulmonary disease)   . DM (diabetes mellitus)   . GERD (gastroesophageal reflux disease)   . DVT (deep venous thrombosis)   . B12 deficiency   . AV BLOCK, COMPLETE 01/22/2009  . Congestive heart failure, unspecified 01/22/2009  . PACEMAKER-St.Jude 01/22/2009  . PERSONAL HX COLONIC POLYPS 12/17/2008  . ANXIETY DEPRESSION 11/10/2007  . ABDOMINAL AORTIC ANEURYSM 11/10/2007  . ASTHMA 11/10/2007  . DIVERTICULOSIS, COLON 11/10/2007   Past Medical History  Diagnosis Date  . AV block, complete   . CHF (congestive heart failure)   . AAA (abdominal aortic aneurysm)   . Dyslipidemia   . History of colonic polyps   . Anxiety and depression   . Diverticulosis of colon   . Asthma   . Acute renal failure   . Atrial flutter   . Hyperglycemia   . UTI (lower urinary tract infection)   . COPD (chronic obstructive pulmonary disease)   . HTN (hypertension)   . DM (diabetes mellitus)   . GERD (gastroesophageal reflux disease)   . DVT (deep venous thrombosis)   . B12 deficiency   . Angiodysplasia of intestine (without mention of hemorrhage)     Past Surgical History  Procedure Laterality Date  . Tubal ligation    . Abdominal hysterectomy    . Appendectomy    . Pacemaker insertion      medtronic   . Colonoscopy  02/06/2002    403.47,425.95  . Bladder suspension      Current Facility-Administered Medications  Medication Dose Route Frequency Provider Last Rate Last Dose  . azithromycin (ZITHROMAX) 500 mg in dextrose 5 % 250 mL IVPB  500 mg Intravenous Once Doy Hutching, MD 250 mL/hr at 12/05/13 0100 500 mg at 12/05/13 0100   Current Outpatient Prescriptions  Medication Sig Dispense Refill  . albuterol (VENTOLIN HFA) 108 (90 BASE) MCG/ACT inhaler Inhale 2 puffs into the lungs every 6 (six) hours as needed. For shortness  of breath.      Marland Kitchen aspirin 81 MG EC tablet Take 81 mg by mouth daily.        Marland Kitchen azithromycin (ZITHROMAX) 250 MG tablet Take 1 tablet by mouth daily.      . Cholecalciferol (VITAMIN D) 2000 UNITS tablet Take 5,000 Units by mouth daily.       Marland Kitchen esomeprazole (NEXIUM) 40 MG capsule Take 40 mg by mouth daily before breakfast.      . furosemide (LASIX) 40 MG tablet Take 40 mg by mouth daily.      . montelukast (SINGULAIR) 10 MG tablet Take 10 mg by mouth daily.        . predniSONE (DELTASONE) 10 MG tablet Take 10 mg by mouth daily with breakfast.      . tiotropium (SPIRIVA) 18 MCG inhalation capsule Place 18 mcg into inhaler and inhale daily.        . vitamin B-12 (CYANOCOBALAMIN) 1000 MCG tablet Take 1,000 mcg by mouth daily.        Allergies  Allergen Reactions  . Advair Diskus [Fluticasone-Salmeterol]     Itching  . Biaxin [Clarithromycin]     N/V  . Ciprofloxacin     N/V  . Doxycycline Other (See Comments)    Inside of mouth and tongue red and peeled  . Keflex [Cephalexin]     N/V  . Ketek [Telithromycin]     N/V  . Levaquin [Levofloxacin In D5w]     N/V  . Macrobid [Nitrofurantoin]     N/V  . Penicillins     Rash  . Sulfonamide Derivatives     History  Substance Use Topics  . Smoking status: Former Smoker    Quit date: 08/23/1988  . Smokeless tobacco: Never Used     Comment: quit in 1990, smoked 1 ppd x 30 years   . Alcohol Use: No    Family History  Problem Relation Age of Onset  . Emphysema Maternal Uncle     multiple   . Asthma Daughter   . Colon cancer Sister     alive at 64   . Heart disease Father   . CVA Mother   . Heart disease Mother      Objective:  Patient Vitals for the past 8 hrs:  BP Temp Pulse Resp SpO2  12/05/13 0034 113/55 mmHg - 99 - 98 %  12/05/13 0018 - - - - 100 %  12/05/13 0015 119/61 mmHg 100.6 F (38.1 C) 100 24 -  12/05/13 0000 118/59 mmHg 100.6 F (38.1 C) 97 28 -  12/04/13 2345 125/59 mmHg 100.8 F (38.2 C) 95 23 -  12/04/13  2330 133/62 mmHg 100.9 F (38.3 C) 93 25 -  12/04/13 2300 126/64 mmHg 101.1 F (38.4 C) 92 27 97 %  12/04/13 2245 141/68 mmHg 100.6 F (38.1 C) 95 23 -  12/04/13 2235 126/67 mmHg - 98 - 96 %  12/04/13 2230 126/67 mmHg -  97 30 -   General appearance: lethargic, elderly appearing female on BiPAP Head: Normocephalic, without obvious abnormality, atraumatic Eyes:  PERRL, EOM's intact.  Neck: thick neck difficult to appreciated JVD Lungs: coarse breath sounds bilaterally Chest wall: no tenderness Heart: regular rate and rhythm, S1, S2 normal,  Abdomen: soft, non-tender; bowel sounds normal; no masses,  no organomegaly Extremities: extremities normal, atraumatic, no cyanosis or edema Pulses: 2+ and symmetric Neurologic: awakens to voice, answers questions yes/no  Results for orders placed during the hospital encounter of 12/04/13 (from the past 48 hour(s))  URINALYSIS, ROUTINE W REFLEX MICROSCOPIC     Status: Abnormal   Collection Time    12/04/13 10:48 PM      Result Value Ref Range   Color, Urine YELLOW  YELLOW   APPearance CLOUDY (*) CLEAR   Specific Gravity, Urine 1.020  1.005 - 1.030   pH 5.0  5.0 - 8.0   Glucose, UA NEGATIVE  NEGATIVE mg/dL   Hgb urine dipstick SMALL (*) NEGATIVE   Bilirubin Urine NEGATIVE  NEGATIVE   Ketones, ur NEGATIVE  NEGATIVE mg/dL   Protein, ur 30 (*) NEGATIVE mg/dL   Urobilinogen, UA 0.2  0.0 - 1.0 mg/dL   Nitrite NEGATIVE  NEGATIVE   Leukocytes, UA NEGATIVE  NEGATIVE  URINE MICROSCOPIC-ADD ON     Status: Abnormal   Collection Time    12/04/13 10:48 PM      Result Value Ref Range   Squamous Epithelial / LPF RARE  RARE   WBC, UA 0-2  <3 WBC/hpf   RBC / HPF 0-2  <3 RBC/hpf   Bacteria, UA MANY (*) RARE   Casts HYALINE CASTS (*) NEGATIVE  TROPONIN I     Status: Abnormal   Collection Time    12/04/13 10:58 PM      Result Value Ref Range   Troponin I 0.37 (*) <0.30 ng/mL   Comment:            Due to the release kinetics of cTnI,     a negative  result within the first hours     of the onset of symptoms does not rule out     myocardial infarction with certainty.     If myocardial infarction is still suspected,     repeat the test at appropriate intervals.     CRITICAL RESULT CALLED TO, READ BACK BY AND VERIFIED WITH:     PYLANT,M RN 12/05/2013 0013 JORDANS  PRO B NATRIURETIC PEPTIDE     Status: Abnormal   Collection Time    12/04/13 10:58 PM      Result Value Ref Range   Pro B Natriuretic peptide (BNP) 17479.0 (*) 0 - 450 pg/mL  COMPREHENSIVE METABOLIC PANEL     Status: Abnormal   Collection Time    12/04/13 10:58 PM      Result Value Ref Range   Sodium 143  137 - 147 mEq/L   Potassium 4.0  3.7 - 5.3 mEq/L   Chloride 99  96 - 112 mEq/L   CO2 25  19 - 32 mEq/L   Glucose, Bld 200 (*) 70 - 99 mg/dL   BUN 26 (*) 6 - 23 mg/dL   Creatinine, Ser 9.01 (*) 0.50 - 1.10 mg/dL   Calcium 9.1  8.4 - 22.2 mg/dL   Total Protein 6.8  6.0 - 8.3 g/dL   Albumin 3.4 (*) 3.5 - 5.2 g/dL   AST 25  0 - 37 U/L   ALT 6  0 - 35 U/L   Alkaline Phosphatase 65  39 - 117 U/L   Total Bilirubin 0.7  0.3 - 1.2 mg/dL   GFR calc non Af Amer 30 (*) >90 mL/min   GFR calc Af Amer 35 (*) >90 mL/min   Comment: (NOTE)     The eGFR has been calculated using the CKD EPI equation.     This calculation has not been validated in all clinical situations.     eGFR's persistently <90 mL/min signify possible Chronic Kidney     Disease.  CBC WITH DIFFERENTIAL     Status: Abnormal   Collection Time    12/04/13 10:58 PM      Result Value Ref Range   WBC 16.2 (*) 4.0 - 10.5 K/uL   RBC 3.99  3.87 - 5.11 MIL/uL   Hemoglobin 12.4  12.0 - 15.0 g/dL   HCT 39.3  36.0 - 46.0 %   MCV 98.5  78.0 - 100.0 fL   MCH 31.1  26.0 - 34.0 pg   MCHC 31.6  30.0 - 36.0 g/dL   RDW 13.6  11.5 - 15.5 %   Platelets 112 (*) 150 - 400 K/uL   Comment: SPECIMEN CHECKED FOR CLOTS     REPEATED TO VERIFY     PLATELET COUNT CONFIRMED BY SMEAR   Neutrophils Relative % 87 (*) 43 - 77 %    Neutro Abs 14.0 (*) 1.7 - 7.7 K/uL   Lymphocytes Relative 9 (*) 12 - 46 %   Lymphs Abs 1.5  0.7 - 4.0 K/uL   Monocytes Relative 4  3 - 12 %   Monocytes Absolute 0.6  0.1 - 1.0 K/uL   Eosinophils Relative 0  0 - 5 %   Eosinophils Absolute 0.0  0.0 - 0.7 K/uL   Basophils Relative 0  0 - 1 %   Basophils Absolute 0.0  0.0 - 0.1 K/uL  I-STAT CG4 LACTIC ACID, ED     Status: None   Collection Time    12/04/13 11:07 PM      Result Value Ref Range   Lactic Acid, Venous 2.18  0.5 - 2.2 mmol/L  I-STAT ARTERIAL BLOOD GAS, ED     Status: Abnormal   Collection Time    12/04/13 11:45 PM      Result Value Ref Range   pH, Arterial 7.362  7.350 - 7.450   pCO2 arterial 55.7 (*) 35.0 - 45.0 mmHg   pO2, Arterial 137.0 (*) 80.0 - 100.0 mmHg   Bicarbonate 31.1 (*) 20.0 - 24.0 mEq/L   TCO2 33  0 - 100 mmol/L   O2 Saturation 99.0     Acid-Base Excess 5.0 (*) 0.0 - 2.0 mmol/L   Patient temperature 101.1 F     Collection site RADIAL, ALLEN'S TEST ACCEPTABLE     Drawn by RT     Sample type ARTERIAL     Dg Chest Port 1 View  12/04/2013   CLINICAL DATA:  Respiratory distress.  EXAM: PORTABLE CHEST - 1 VIEW  COMPARISON:  CT CHEST W/O CM dated 08/16/2011; DG CHEST 1V PORT dated 08/14/2011  FINDINGS: Advanced COPD again noted. There may be subtle infiltrates in the right upper lobe and right lower lobe. Parenchymal scarring is present bilaterally. No edema, pneumothorax or pleural effusion is identified. The heart size is normal. There is a stable appearance to a biventricular pacer.  IMPRESSION: Advanced COPD with potential infiltrates in the right upper lobe and right lower lobe.  Electronically Signed   By: Aletta Edouard M.D.   On: 12/04/2013 23:44    ECG:  Probable sinus rhythm, NS IVCD, wandering baseline, prior ventricular paced at 70 BPM,   Impression: Troponin elevation- Suspect Demand Ischemia secondary to multifactorial conditions as below: Acute on chronic respiratory failure Sepsis secondary to  PNA Acute on chronic systolic CHF  Recommendations: -Close monitoring on telemetry -Repeat ekg, poor baseline on initial -Trend cardiac enzymes -ASA $RemoveBefo'81mg'[id number]$ , No anticoagulation at this time -IV diuresis with Lasix $RemoveBe'40mg'bFtRGranX$  BID -Patient was on [place] as of last office visit with cardiology, not current on home med list, will need to clarify why discontinued. -Strict I/Os, daily weights, 1.5 liter fluid restriction -TTE in am reassess LV function, wall motion, valvular disorders -Manangement of Respiratory Failure, PNA, COPD as per primary team.  Thank you for this consult  We will follow along with you and make further recommendations as indicated.

## 2013-12-05 NOTE — ED Notes (Signed)
Report called to 3s. Pt transported upstairs.

## 2013-12-05 NOTE — Progress Notes (Signed)
Inpatient Diabetes Program Recommendations  AACE/ADA: New Consensus Statement on Inpatient Glycemic Control (2013)  Target Ranges:  Prepandial:   less than 140 mg/dL      Peak postprandial:   less than 180 mg/dL (1-2 hours)      Critically ill patients:  140 - 180 mg/dL  Results for [name], [name] (MRN [id number]) as of 12/05/2013 12:43  Ref. Range 09/11/2013 15:05  Hemoglobin A1C Latest Range: <5.7 % 7.1 (H)    Results for [name], [name] (MRN [id number]) as of 12/05/2013 12:43  Ref. Range 12/04/2013 22:58 12/05/2013 04:41  Glucose Latest Range: 70-99 mg/dL 782200 (H) 956401 (H)   Diabetes history: DM2 Outpatient Diabetes medications: None Current orders for Inpatient glycemic control: None  Inpatient Diabetes Program Recommendations Correction (SSI): Please consider ordering Novolog moderate correction scale Q4H. HgbA1C: Last A1C in the chart was 7.1% on 09/11/13.  Please consider ordering an A1C to evaluate glycemic control over the past 2-3 months.  Thanks, Orlando PennerMarie Loran Auguste, RN, MSN, CCRN Diabetes Coordinator Inpatient Diabetes Program [phone] (Team Pager) [phone] ([facility] office) [phone] [facility] office)

## 2013-12-05 NOTE — H&P (Signed)
PCP:   [name],[name], MD   Chief Complaint:  Found down sob in her bed  HPI: 80 yo female h/o chf, copd, CRF on 5 Liter [place] at home, ckd, was called all day by her children, after several hours when she would not answer the phone, they sent someone to check on her.  One of her sons lives with her, when he left this am she was fine.  Another son went to check on her and found her in her bed, having much difficulty in breathing on RA.  EMS was called, on arrival her oxygen sats were in the mid 70s.  bipap was placed and pt brought to the ED.  Daughter and son present with her, say she has been coughing really since Saturday (3 days ago) and having more difficultly breathing.  More sob than normal.  No fevers.  No n/v/d.  No swelling.  Her children say she sometimes does not take her lasix.  Pt has improved since being on bipap (for past hour or so).  She is becoming more responsive and alert.  She denies any cp right now.  She says her sob is better.  She can answer minimal questions on bipap, and is still in moderate respiratory distress.  In ED also found to be febrile.  Review of Systems:  Unobtainable from patient  Past Medical History: Past Medical History  Diagnosis Date  . AV block, complete   . CHF (congestive heart failure)   . AAA (abdominal aortic aneurysm)   . Dyslipidemia   . History of colonic polyps   . Anxiety and depression   . Diverticulosis of colon   . Asthma   . Acute renal failure   . Atrial flutter   . Hyperglycemia   . UTI (lower urinary tract infection)   . COPD (chronic obstructive pulmonary disease)   . HTN (hypertension)   . DM (diabetes mellitus)   . GERD (gastroesophageal reflux disease)   . DVT (deep venous thrombosis)   . B12 deficiency   . Angiodysplasia of intestine (without mention of hemorrhage)    Past Surgical History  Procedure Laterality Date  . Tubal ligation    . Abdominal hysterectomy    . Appendectomy    . Pacemaker insertion       medtronic   . Colonoscopy  02/06/2002    562.13,[id number],562.10  . Bladder suspension      Medications: Prior to Admission medications   Medication Sig Start Date End Date Taking? Authorizing Provider  albuterol (VENTOLIN HFA) 108 (90 BASE) MCG/ACT inhaler Inhale 2 puffs into the lungs every 6 (six) hours as needed. For shortness of breath.   Yes Historical Provider, MD  aspirin 81 MG EC tablet Take 81 mg by mouth daily.     Yes Historical Provider, MD  azithromycin (ZITHROMAX) 250 MG tablet Take 1 tablet by mouth daily. 11/29/13  Yes Historical Provider, MD  Cholecalciferol (VITAMIN D) 2000 UNITS tablet Take 5,000 Units by mouth daily.    Yes Historical Provider, MD  esomeprazole (NEXIUM) 40 MG capsule Take 40 mg by mouth daily before breakfast.   Yes Historical Provider, MD  furosemide (LASIX) 40 MG tablet Take 40 mg by mouth daily. 08/11/12  Yes Marinus MawGregg W Taylor, MD  montelukast (SINGULAIR) 10 MG tablet Take 10 mg by mouth daily.     Yes Historical Provider, MD  predniSONE (DELTASONE) 10 MG tablet Take 10 mg by mouth daily with breakfast.   Yes Historical Provider, MD  tiotropium (SPIRIVA) 18 MCG inhalation capsule Place 18 mcg into inhaler and inhale daily.     Yes Historical Provider, MD  vitamin B-12 (CYANOCOBALAMIN) 1000 MCG tablet Take 1,000 mcg by mouth daily.   Yes Historical Provider, MD    Allergies:   Allergies  Allergen Reactions  . Advair Diskus [Fluticasone-Salmeterol]     Itching  . Biaxin [Clarithromycin]     N/V  . Ciprofloxacin     N/V  . Doxycycline Other (See Comments)    Inside of mouth and tongue red and peeled  . Keflex [Cephalexin]     N/V  . Ketek [Telithromycin]     N/V  . Levaquin [Levofloxacin In D5w]     N/V  . Macrobid [Nitrofurantoin]     N/V  . Penicillins     Rash  . Sulfonamide Derivatives     Social History:  reports that she quit smoking about 25 years ago. She has never used smokeless tobacco. She reports that she does not drink alcohol or  use illicit drugs.  Family History: Family History  Problem Relation Age of Onset  . Emphysema Maternal Uncle     multiple   . Asthma Daughter   . Colon cancer Sister     alive at 28   . Heart disease Father   . CVA Mother   . Heart disease Mother     Physical Exam: Filed Vitals:   12/05/13 0000 12/05/13 0015 12/05/13 0018 12/05/13 0034  BP: 118/59 119/61  113/55  Pulse: 97 100  99  Temp: 100.6 F (38.1 C) 100.6 F (38.1 C)    Resp: 28 24    SpO2:   100% 98%   General appearance: alert, cooperative and moderate distress Head: Normocephalic, without obvious abnormality, atraumatic Eyes: negative Nose: Nares normal. Septum midline. Mucosa normal. No drainage or sinus tenderness. Neck: no JVD and supple, symmetrical, trachea midline Lungs: diminished breath sounds bilaterally and rales bilaterally Heart: regular rate and rhythm, S1, S2 normal, no murmur, click, rub or gallop Abdomen: soft, non-tender; bowel sounds normal; no masses,  no organomegaly Extremities: extremities normal, atraumatic, no cyanosis or edema Pulses: 2+ and symmetric Skin: Skin color, texture, turgor normal. No rashes or lesions Neurologic: Grossly normal    Labs on Admission:   Recent Labs  12/04/13 2258  NA 143  K 4.0  CL 99  CO2 25  GLUCOSE 200*  BUN 26*  CREATININE 1.57*  CALCIUM 9.1    Recent Labs  12/04/13 2258  AST 25  ALT 6  ALKPHOS 65  BILITOT 0.7  PROT 6.8  ALBUMIN 3.4*    Recent Labs  12/04/13 2258  WBC 16.2*  NEUTROABS 14.0*  HGB 12.4  HCT 39.3  MCV 98.5  PLT 112*    Recent Labs  12/04/13 2258  TROPONINI 0.37*   Radiological Exams on Admission: Dg Chest Port 1 View  12/04/2013   CLINICAL DATA:  Respiratory distress.  EXAM: PORTABLE CHEST - 1 VIEW  COMPARISON:  CT CHEST W/O CM dated 08/16/2011; DG CHEST 1V PORT dated 08/14/2011  FINDINGS: Advanced COPD again noted. There may be subtle infiltrates in the right upper lobe and right lower lobe. Parenchymal  scarring is present bilaterally. No edema, pneumothorax or pleural effusion is identified. The heart size is normal. There is a stable appearance to a biventricular pacer.  IMPRESSION: Advanced COPD with potential infiltrates in the right upper lobe and right lower lobe.   Electronically Signed   By: Sherrine Maples  Fredia SorrowYamagata M.D.   On: 12/04/2013 23:44    Assessment/Plan  80 yo female with acute on chronic respiratory failure secondary to pna and probable acute chf exacerbation with possible nstemi  Principal Problem:   Acute on chronic respiratory failure-  Likely multifactorial.  Place on rocephin and azithro.  Also place on lasix iv.  Trop mildly elevated, cardiology also has been consulted.  Cont bipap thru the night.  Iv solumedrol.  Place in stepdown.  Repeat abg in am.  She seems to be improving with only one hour of bipap already.  Active Problems:   Congestive heart failure, unspecified-  Iv lasix, increase to 40mg  iv q12   PACEMAKER-St.Jude  stable   COPD (chronic obstructive pulmonary disease)  Nebs.  solumedrol   NSTEMI (non-ST elevated myocardial infarction) serial enzymes.  Cardiology consult   PNA (pneumonia)  Iv abx  Pt is FULL CODE.  Rachal A Onalee HuaDavid 12/05/2013, 12:58 AM

## 2013-12-05 NOTE — Progress Notes (Signed)
Pt removed from BiPap and placed on 5L [place] for transport to ICU. BiPap on standby there. Pt tolerating [place] well. Sats 98%/ HR 90, RR 20

## 2013-12-05 NOTE — ED Notes (Signed)
Dr. Clearance CootsHarper at bedside to speak with pt. Informed Dr. Clearance CootsHarper the critical lab regarding Trop

## 2013-12-05 NOTE — Progress Notes (Signed)
  Echocardiogram 2D Echocardiogram has been performed.  Arvil ChacoRachel Foster 12/05/2013, 2:09 PM

## 2013-12-05 NOTE — ED Provider Notes (Signed)
I saw and evaluated the patient, reviewed the resident's note and I agree with the findings and plan.   EKG Interpretation   Date/Time:  Tuesday December 04 2013 22:44:17 EDT Ventricular Rate:  95 PR Interval:  135 QRS Duration: 137 QT Interval:  426 QTC Calculation: 536 R Axis:   -100 Text Interpretation:  Age not entered, assumed to be  80 years old for  purpose of ECG interpretation Sinus rhythm IVCD, consider atypical RBBB  Inferior infarct, old Abnormal lateral Q waves Probable anterior infarct,  age indeterminate Baseline wander in lead(s) I II III aVR aVL aVF V1 V2 V3      I have reviewed EKG and agree with the resident interpretation.  you Pt presenting with hypoxia concrening for infection vs CHF exacerbation vs COPD exacerbation vs a component of all 3.  Pt sating 70's on home O2 and placed on bipap with signifincant improvement in resp status and resolution of accessory muscle use.  Labs and imaging indicate chf and infection.  Started on CAP and lasix.  Admitted for further care. CRITICAL CARE Performed by: Jermaine Tholl Total critical care time: 30 Critical care time was exclusive of separately billable procedures and treating other patients. Critical care was necessary to treat or prevent imminent or life-threatening deterioration. Critical care was time spent personally by me on the following activities: development of treatment plan with patient and/or surrogate as well as nursing, discussions with consultants, evaluation of patient's response to treatment, examination of patient, obtaining history from patient or surrogate, ordering and performing treatments and interventions, ordering and review of laboratory studies, ordering and review of radiographic studies, pulse oximetry and re-evaluation of patient's condition.   Gwyneth SproutWhitney Ladon Heney, MD 12/05/13 1014

## 2013-12-05 NOTE — Progress Notes (Signed)
RT removed pt from BIPAP. Placed on 4LNC. 02saturations are 93%. Vital signs stable. No complications. RN aware. Daughter at bedside. RT will continue to monitor.

## 2013-12-06 DIAGNOSIS — E119 Type 2 diabetes mellitus without complications: Secondary | ICD-10-CM

## 2013-12-06 DIAGNOSIS — E869 Volume depletion, unspecified: Secondary | ICD-10-CM

## 2013-12-06 DIAGNOSIS — I442 Atrioventricular block, complete: Secondary | ICD-10-CM

## 2013-12-06 LAB — GLUCOSE, CAPILLARY
GLUCOSE-CAPILLARY: 238 mg/dL — AB (ref 70–99)
GLUCOSE-CAPILLARY: 257 mg/dL — AB (ref 70–99)
GLUCOSE-CAPILLARY: 256 mg/dL — AB (ref 70–99)
GLUCOSE-CAPILLARY: 293 mg/dL — AB (ref 70–99)
GLUCOSE-CAPILLARY: 234 mg/dL — AB (ref 70–99)

## 2013-12-06 LAB — CBC
HEMATOCRIT: 34.6 % — AB (ref 36.0–46.0)
Hemoglobin: 10.7 g/dL — ABNORMAL LOW (ref 12.0–15.0)
MCH: 30.2 pg (ref 26.0–34.0)
MCHC: 30.9 g/dL (ref 30.0–36.0)
MCV: 97.7 fL (ref 78.0–100.0)
Platelets: 111 10*3/uL — ABNORMAL LOW (ref 150–400)
RBC: 3.54 MIL/uL — AB (ref 3.87–5.11)
RDW: 13.6 % (ref 11.5–15.5)
WBC: 15.2 10*3/uL — ABNORMAL HIGH (ref 4.0–10.5)

## 2013-12-06 LAB — BASIC METABOLIC PANEL
BUN: 45 mg/dL — AB (ref 6–23)
CHLORIDE: 94 meq/L — AB (ref 96–112)
CO2: 26 meq/L (ref 19–32)
CREATININE: 1.97 mg/dL — AB (ref 0.50–1.10)
Calcium: 9.2 mg/dL (ref 8.4–10.5)
GFR calc Af Amer: 26 mL/min — ABNORMAL LOW (ref >90)
GFR calc non Af Amer: 23 mL/min — ABNORMAL LOW (ref >90)
GLUCOSE: 332 mg/dL — AB (ref 70–99)
Potassium: 3.7 mEq/L (ref 3.7–5.3)
Sodium: 139 mEq/L (ref 137–147)

## 2013-12-06 LAB — PROCALCITONIN: PROCALCITONIN: 6.51 ng/mL

## 2013-12-06 MED ORDER — ALUM & MAG HYDROXIDE-SIMETH 200-200-20 MG/5ML PO SUSP
30.0000 mL | ORAL | Status: DC | PRN
  Administered 2013-12-06: 30 mL via ORAL
  Filled 2013-12-06: qty 30

## 2013-12-06 MED ORDER — IPRATROPIUM-ALBUTEROL 0.5-2.5 (3) MG/3ML IN SOLN
3.0000 mL | Freq: Three times a day (TID) | RESPIRATORY_TRACT | Status: DC
  Administered 2013-12-06 – 2013-12-08 (×8): 3 mL via RESPIRATORY_TRACT
  Filled 2013-12-06 (×9): qty 3

## 2013-12-06 MED ORDER — ALBUTEROL SULFATE (2.5 MG/3ML) 0.083% IN NEBU: 2.5000 mg | INHALATION_SOLUTION | Freq: Four times a day (QID) | RESPIRATORY_TRACT | Status: DC | PRN

## 2013-12-06 MED ORDER — SODIUM CHLORIDE 0.9 % IV SOLN
INTRAVENOUS | Status: DC
  Administered 2013-12-06 – 2013-12-09 (×3): via INTRAVENOUS

## 2013-12-06 MED ORDER — ENOXAPARIN SODIUM 30 MG/0.3ML ~~LOC~~ SOLN
30.0000 mg | SUBCUTANEOUS | Status: DC
Start: 2013-12-06 — End: 2013-12-10
  Administered 2013-12-06 – 2013-12-09 (×4): 30 mg via SUBCUTANEOUS
  Filled 2013-12-06 (×5): qty 0.3

## 2013-12-06 MED ORDER — VANCOMYCIN HCL IN DEXTROSE 1-5 GM/200ML-% IV SOLN
1000.0000 mg | Freq: Once | INTRAVENOUS | Status: AC
  Administered 2013-12-06: 1000 mg via INTRAVENOUS
  Filled 2013-12-06: qty 200

## 2013-12-06 MED ORDER — VANCOMYCIN HCL IN DEXTROSE 1-5 GM/200ML-% IV SOLN
1000.0000 mg | INTRAVENOUS | Status: DC
  Administered 2013-12-07: 1000 mg via INTRAVENOUS
  Filled 2013-12-06: qty 200

## 2013-12-06 NOTE — Progress Notes (Signed)
Physician notified: A. Rennis HardingEllis, PA At: [id number]  Regarding: Pt requesting antacid PRN. Thanks.   Order placed in CPOE.

## 2013-12-06 NOTE — Care Management Note (Addendum)
    Page 1 of 2   12/13/2013     2:06:06 PM CARE MANAGEMENT NOTE 12/13/2013  Patient:  [name],[name]   Account Number:  [id number]  Date Initiated:  12/06/2013  Documentation initiated by:  GRAVES-BIGELOW,BRENDA  Subjective/Objective Assessment:   Pt admitted for SOB. On Home 02 5L.Pt is from home with son. Initiated on Bipap.     Action/Plan:   CM will continue to monitor for dispositon needs.   Anticipated DC Date:  12/11/2013   Anticipated DC Plan:  HOME W HOME HEALTH SERVICES      DC Planning Services  CM consult      [facility]   Choice offered to / List presented to:  C-4 Adult Children   DME arranged  OXYGEN      [facility]     HH arranged  HH-1 RN  HH-10 DISEASE MANAGEMENT  HH-2 PT      HH agency  [facility]   Status of service:  Completed, signed off Medicare Important Message given?   (If response is "NO", the following Medicare IM given date fields will be blank) Date Medicare IM given:   Date Additional Medicare IM given:    Discharge Disposition:    Per UR Regulation:  Reviewed for med. necessity/level of care/duration of stay  If discussed at Long Length of Stay Meetings, dates discussed:   12/11/2013    Comments:  12/13/2013 1345 Camila Liamellia Johnson, RN, BSN, [place] [phone] Pt currently recieves DME oxygen through [facility] and needs a tank to go home.  NCM called company and they will deliver tank to pt room asap.  12/12/13 1315 Oletta Cohnamellia Noely Kuhnle, RN, BSN, [place] [phone] Spoke with pt daughter [name]([name]) via telephone regarding discharge planning for Home Health Services. Read  list of home health agencies to choose from.  Daughter chose [facility] to render services. Kizzie Furnishonna Fellmy, [facility] of [facility] notified.  No DME needs identified at this time

## 2013-12-06 NOTE — Progress Notes (Signed)
ANTIBIOTIC CONSULT NOTE - INITIAL  Pharmacy Consult for vancomycin Indication: bacteremia  Allergies  Allergen Reactions  . Advair Diskus [Fluticasone-Salmeterol]     Itching  . Biaxin [Clarithromycin]     N/V  . Ciprofloxacin     N/V  . Doxycycline Other (See Comments)    Inside of mouth and tongue red and peeled  . Keflex [Cephalexin]     N/V  . Ketek [Telithromycin]     N/V  . Levaquin [Levofloxacin In D5w]     N/V  . Macrobid [Nitrofurantoin]     N/V  . Penicillins     Rash  . Sulfonamide Derivatives     Patient Measurements: Height: 5\' 3"  (160 cm) Weight: 186 lb 1.1 oz (84.4 kg) IBW/kg (Calculated) : 52.4  Vital Signs: Temp: 98 F (36.7 C) (04/15 2010) Temp src: Oral (04/15 2010) BP: 107/58 mmHg (04/15 1558) Pulse Rate: 90 (04/15 1558) Intake/Output from previous day: 04/15 0701 - 04/16 0700 In: 243 [P.O.:240; I.V.:3] Out: 525 [Urine:525]  Labs:  Recent Labs  12/04/13 2258 12/05/13 0441  WBC 16.2* 14.8*  HGB 12.4 12.1  PLT 112* 109*  CREATININE 1.57* 1.76*   Estimated Creatinine Clearance: 26.2 ml/min (by C-G formula based on Cr of 1.76).   Microbiology: Recent Results (from the past 720 hour(s))  CULTURE, BLOOD (ROUTINE X 2)     Status: None   Collection Time    12/04/13 10:58 PM      Result Value Ref Range Status   Specimen Description BLOOD BLOOD LEFT FOREARM   Final   Special Requests BOTTLES DRAWN AEROBIC AND ANAEROBIC 10CC EACH   Final   Culture  Setup Time     Final   Value: 12/05/2013 04:57     Performed at [facility]   Culture     Final   Value: GRAM POSITIVE COCCI IN CLUSTERS     Note: Gram Stain Report Called to,Read Back By and Verified With: LEE WORLEY 12/06/13 1242A FULKC     Performed at [facility]   Report Status PENDING   Incomplete  MRSA PCR SCREENING     Status: None   Collection Time    12/05/13  6:30 AM      Result Value Ref Range Status   MRSA by PCR NEGATIVE  NEGATIVE Final   Comment:             The GeneXpert MRSA Assay (FDA     approved for NASAL specimens     only), is one component of a     comprehensive MRSA colonization     surveillance program. It is not     intended to diagnose MRSA     infection nor to guide or     monitor treatment for     MRSA infections.    Medical History: Past Medical History  Diagnosis Date  . AV block, complete   . CHF (congestive heart failure)   . AAA (abdominal aortic aneurysm)   . Dyslipidemia   . History of colonic polyps   . Anxiety and depression   . Diverticulosis of colon   . Asthma   . Acute renal failure   . Atrial flutter   . Hyperglycemia   . UTI (lower urinary tract infection)   . COPD (chronic obstructive pulmonary disease)   . HTN (hypertension)   . DM (diabetes mellitus)   . GERD (gastroesophageal reflux disease)   . DVT (deep venous thrombosis)   .  B12 deficiency   . Angiodysplasia of intestine (without mention of hemorrhage)     Medications:  Prescriptions prior to admission  Medication Sig Dispense Refill  . albuterol (VENTOLIN HFA) 108 (90 BASE) MCG/ACT inhaler Inhale 2 puffs into the lungs every 6 (six) hours as needed. For shortness of breath.      Marland Kitchen. aspirin 81 MG EC tablet Take 81 mg by mouth daily.        Marland Kitchen. azithromycin (ZITHROMAX) 250 MG tablet Take 1 tablet by mouth daily.      . Cholecalciferol (VITAMIN D) 2000 UNITS tablet Take 5,000 Units by mouth daily.       Marland Kitchen. esomeprazole (NEXIUM) 40 MG capsule Take 40 mg by mouth daily before breakfast.      . furosemide (LASIX) 40 MG tablet Take 40 mg by mouth daily.      . montelukast (SINGULAIR) 10 MG tablet Take 10 mg by mouth daily.        . predniSONE (DELTASONE) 10 MG tablet Take 10 mg by mouth daily with breakfast.      . tiotropium (SPIRIVA) 18 MCG inhalation capsule Place 18 mcg into inhaler and inhale daily.        . vitamin B-12 (CYANOCOBALAMIN) 1000 MCG tablet Take 1,000 mcg by mouth daily.       Scheduled:  . aspirin EC  81 mg Oral Daily  .  azithromycin  250 mg Intravenous Q24H  . cefTRIAXone (ROCEPHIN)  IV  1 g Intravenous Q24H  . enoxaparin (LOVENOX) injection  40 mg Subcutaneous Q24H  . furosemide  40 mg Intravenous Q12H  . insulin aspart  0-9 Units Subcutaneous TID WC  . ipratropium-albuterol  3 mL Nebulization Q6H  . methylPREDNISolone (SOLU-MEDROL) injection  60 mg Intravenous Q12H    Assessment: 80yo female admitted 4/15 w/ severe SOB, known to skip Lasix at times, improved w/ BiPAP, on azithro/Rocephin for HCAP, now w/ GPC in clusters on blood Cx, to broaden IV ABX.  Goal of Therapy:  Vancomycin trough level 15-20 mcg/ml  Plan:  Will begin vancomycin 1000mg  IV Q24H and monitor CBC, Cx, levels prn.  Vernard GamblesVeronda Seira Cody, PharmD, BCPS  12/06/2013,1:00 AM

## 2013-12-06 NOTE — Progress Notes (Signed)
Results for Salvatore MarvelBIGGS, Talissa S (MRN [id number]) as of 12/06/2013 09:31  Ref. Range 12/05/2013 08:18 12/05/2013 15:53 12/05/2013 20:19 12/05/2013 22:02 12/06/2013 08:09  Glucose-Capillary Latest Range: 70-99 mg/dL 629342 (H) 528244 (H) 413238 (H) 257 (H) 256 (H)  CBGs continue to be greater than 180 mg/dl.  Continues to be on steroids.  Recommend increasing Novolog correction scale to MODERATE.  If CBGs continue to be greater than 180 mg/dl, may need to start Lantus 10-15 units daily.  Will need to follow to see if patient will go home on insulin.  Smith MinceKendra Lemoyne Scarpati RN BSN CDE

## 2013-12-06 NOTE — Progress Notes (Signed)
INITIAL NUTRITION ASSESSMENT  DOCUMENTATION CODES Per approved criteria  -Obesity Unspecified   INTERVENTION: Pt declined scheduled oral nutrition supplements at this time. Encouraged adequate intake. RD to continue to follow nutrition care plan.  NUTRITION DIAGNOSIS: Increased nutrient needs related to COPD as evidenced by estimated needs.   Goal: Intake to meet >90% of estimated nutrition needs.  Monitor:  weight trends, lab trends, I/O's, PO intake, supplement tolerance  Reason for Assessment: Low Braden Score  80 y.o. female  Admitting Dx: acute on chronic respiratory failure  ASSESSMENT: PMHx significant for CHF, COPD (on 5 liters O2), CKD. Admitted with SOB. Work-up reveals acute on chronic respiratory failure.  Pt's CXR is consistent with PNA. Currently ordered for Carbohydrate Modified Medium diet. Intake is adequate, pt is consuming 75% of meals. Pt states that she is eating well. States that she doesn't want any supplements, confirms that her weight has been stable.  Patient with low braden score.  HgbA1c is 7.1 CBG's: 256, 257, 238  Height: Ht Readings from Last 1 Encounters:  12/05/13 5\' 3"  (1.6 m)    Weight: Wt Readings from Last 1 Encounters:  12/06/13 188 lb 0.8 oz (85.3 kg)    Ideal Body Weight: 115 lb  % Ideal Body Weight: 163%  Wt Readings from Last 10 Encounters:  12/06/13 188 lb 0.8 oz (85.3 kg)  09/20/13 188 lb 12.8 oz (85.639 kg)  08/28/13 191 lb 6.4 oz (86.818 kg)  07/12/13 193 lb (87.544 kg)  08/08/12 191 lb 6.4 oz (86.818 kg)  10/04/11 184 lb (83.462 kg)  08/18/11 178 lb 2.1 oz (80.8 kg)  10/13/10 182 lb (82.555 kg)  11/26/09 192 lb (87.091 kg)  10/07/09 194 lb (87.998 kg)    Usual Body Weight: 180 - 190 lb  % Usual Body Weight: 100%  BMI:  Body mass index is 33.32 kg/(m^2). Obese Class I  Estimated Nutritional Needs: Kcal: 1500 - 1700 Protein: 90 - 100 g Fluid: 1.5 - 1.7 liters daily  Skin: intact  Diet Order: Carb  Control  EDUCATION NEEDS: -No education needs identified at this time   Intake/Output Summary (Last 24 hours) at 12/06/13 0949 Last data filed at 12/06/13 0813  Gross per 24 hour  Intake    493 ml  Output   1025 ml  Net   -532 ml    Last BM: PTA  Labs:   Recent Labs Lab 12/04/13 2258 12/05/13 0441 12/06/13 0234  NA 143 138 139  K 4.0 4.1 3.7  CL 99 94* 94*  CO2 25 19 26   BUN 26* 30* 45*  CREATININE 1.57* 1.76* 1.97*  CALCIUM 9.1 9.1 9.2  GLUCOSE 200* 401* 332*    CBG (last 3)   Recent Labs  12/05/13 2019 12/05/13 2202 12/06/13 0809  GLUCAP 238* 257* 256*   Lab Results  Component Value Date   HGBA1C 7.1* 12/05/2013    Scheduled Meds: . aspirin EC  81 mg Oral Daily  . azithromycin  250 mg Intravenous Q24H  . cefTRIAXone (ROCEPHIN)  IV  1 g Intravenous Q24H  . enoxaparin (LOVENOX) injection  30 mg Subcutaneous Q24H  . insulin aspart  0-9 Units Subcutaneous TID WC  . ipratropium-albuterol  3 mL Nebulization TID  . methylPREDNISolone (SOLU-MEDROL) injection  60 mg Intravenous Q12H  . [START ON 12/07/2013] vancomycin  1,000 mg Intravenous Q24H    Continuous Infusions:   Past Medical History  Diagnosis Date  . AV block, complete   . CHF (congestive heart failure)   .  AAA (abdominal aortic aneurysm)   . Dyslipidemia   . History of colonic polyps   . Anxiety and depression   . Diverticulosis of colon   . Asthma   . Acute renal failure   . Atrial flutter   . Hyperglycemia   . UTI (lower urinary tract infection)   . COPD (chronic obstructive pulmonary disease)   . HTN (hypertension)   . DM (diabetes mellitus)   . GERD (gastroesophageal reflux disease)   . DVT (deep venous thrombosis)   . B12 deficiency   . Angiodysplasia of intestine (without mention of hemorrhage)     Past Surgical History  Procedure Laterality Date  . Tubal ligation    . Abdominal hysterectomy    . Appendectomy    . Pacemaker insertion      medtronic   . Colonoscopy   02/06/2002    295.62,130.86569.84,562.10  . Bladder suspension      Jarold MottoSamantha Raneen Jaffer MS, RD, LDN Inpatient Registered Dietitian Pager: [phone] After-hours pager: [phone]

## 2013-12-06 NOTE — Progress Notes (Signed)
Physician notified: A. Rennis HardingEllis, PA At: 1247  Regarding: Pt having more frequent wet cough, non productive. Bilat breath sounds more coarse crackles; IVF increased to 75 this AM. Awaiting return response.   Returned Response at: 1250  Order(s): Continue IVF. Start IS and ambulation after PT/OT, up to chair as tolerated for increased sputum movement.

## 2013-12-06 NOTE — Plan of Care (Cosign Needed)
Renal fnx has worsened. Admission CXR more c/w PNA as opposed to CHF. Will dc IV Lasix for now. Check PCT to see if can help clarify.  Junious SilkAllison Shanicqua Coldren, ANP

## 2013-12-06 NOTE — Progress Notes (Signed)
[facility]  Salvatore MarvelGertrude S Schoenberger ZOX:[id number] DOB: 01-Apr-1932 DOA: 12/04/2013 PCP: [name],[name], MD  Time spent : 25+ minutes  Brief narrative: 80 year old female patient with multiple medical problems. Chronically on 5 L nasal cannula oxygen at home for her COPD. She also has a history of chronic kidney disease as well as prior CHF and history of permanent pacemaker. On the date of admission her children had been attempting to contact her by phone for several hours but when they were unable to reach her they sent someone to check on the patient. Apparently on the morning of admission she was fine. The person that was sent to check on the patient found her at home on room air and having difficulty breathing noting patient is supposed to be on chronic oxygen. EMS was called to the home and upon their assessment her oxygen saturations were in the 70s. She was immediately placed on BiPAP and brought to the emergency department. Family members confirm patient has been having upper respiratory symptoms consisting of progressive coughing and progressive difficulty breathing. After the above treatment measures were initiated patient became more responsive and alert and reported her shortness of breath had improved. She was also noted to be febrile in the emergency department. Her white count was elevated at 16,200 and a pro BNP was greater than 17,000 without a baseline for comparison.  HPI/Subjective: Patient is seen for followup visit  Assessment/Plan:  Acute on chronic respiratory failure with hypoxia:   A) COPD (chronic obstructive pulmonary disease)   B) PNA (pneumonia) / right upper and lower lobe infiltrates   C) Possible Congestive heart failure, unspecified -Had similar presentation during hospitalization in 2012 - required intubation at that time -Currently has improved so we'll attempt to wean BiPAP -some wheezing today so will cont IV steroids  for now -ABGs revealed elevation in CO2 but normal pH  -Last echocardiogram 2012 revealed mild RV dilatation with preserved LV function and no evidence of diastolic dysfunction-given underlying COPD and likely cor pulmonale we'll repeat echo with preserved LV fnx but inadequate to eval for DD- dry with CVP 3- no RV/RA dilatation but paradoxical septal wall motion (see below) -Continue supportive care including nebulizers and IS-OOB to chair -Continue empiric antibiotic -Patient on chronic prednisone as well as Singulair -chest x-ray was more consistent with pneumonia  -PCT elevated at 6.51 (although should be high given UTI)   E.Coli UTI -cont empiric anbxs  ?? GPC Bacteremia/?Sepsis -1/2 cx's positive -empiric Vanco until cx returned- concerning given pacer/leads -worsened chronic thrombocytopenia   Volume depletion -BUN/Scr have worsened with attempts to diurese -CVP on ECHO 3 -dc Lasix and begin gentle IVF hydration  DM  -CBGs poorly controlled and most recent early a.m. was 342 and likely due to IV steroids -Not on diabetic meds prior to admission -provide sliding scale insulin-increase to moderate scale -HgbA1c 7.1  ?NSTEMI (non-ST elevated myocardial infarction) -Point-of-care troponin mildly elevated at 0.37 but subsequent troponins have been normal  PACEMAKER - [facility] -Followed by Dr. Sharrell KuGreg Taylor as an outpatient -Recent evaluation January 2015 revealed left ventricular lead turned off due to malfunction/inability to program -Dr. Ladona Ridgelaylor felt patient's multifactorial dyspnea might be related to desynchronous RV pacing so plan was to review angios and consider potentially proceeding with LV lead revision -ECHO shows septal motion paradoxical so they have consulted EP  DVT prophylaxis: Lovenox Code Status: Full Family Communication: No family at bedside Disposition Plan/Expected LOS: Stepdown-mobilize-PT/OT  Consultants: None  Procedures: 2-D echocardiogram  -  Left ventricle: The cavity size was normal. Wall thickness was normal. Systolic function was normal. The estimated ejection fraction was in the range of 55% to 60%. Wall motion was normal; there were no regional wall motion abnormalities. The study is not technically sufficient to allow evaluation of LV diastolic function. - Ventricular septum: Septal motion showed paradox.  Antibiotics: Zithromax 4/14 >>> Rocephin 4/14 >>>  Objective: Blood pressure 115/53, pulse 50, temperature 97.5 F (36.4 C), temperature source Oral, resp. rate 24, height 5\' 3"  (1.6 m), weight 188 lb 0.8 oz (85.3 kg), SpO2 91.00%.  Intake/Output Summary (Last 24 hours) at 12/06/13 1121 Last data filed at 12/06/13 0813  Gross per 24 hour  Intake    490 ml  Output   1025 ml  Net   -535 ml   Exam: General: No acute respiratory distress Lungs: Clear to auscultation bilaterally with expiratory wheeze, increasnig O2 demands over the past 24 hrs now up to 6L with wet cough Cardiovascular: Regular rate and rhythm without murmur gallop or rub normal S1 and S2, no peripheral edema or JVD Abdomen: Nontender, nondistended, soft, bowel sounds positive, no rebound, no ascites, no appreciable mass Musculoskeletal: No significant cyanosis, clubbing of bilateral lower extremities Neurological: Alert and oriented x 3, moves all extremities x 4 without focal neurological deficits, CN 2-12 intact   Scheduled Meds:  Scheduled Meds: . aspirin EC  81 mg Oral Daily  . azithromycin  250 mg Intravenous Q24H  . cefTRIAXone (ROCEPHIN)  IV  1 g Intravenous Q24H  . enoxaparin (LOVENOX) injection  30 mg Subcutaneous Q24H  . insulin aspart  0-9 Units Subcutaneous TID WC  . ipratropium-albuterol  3 mL Nebulization TID  . methylPREDNISolone (SOLU-MEDROL) injection  60 mg Intravenous Q12H  . [START ON 12/07/2013] vancomycin  1,000 mg Intravenous Q24H   Data Reviewed: Basic Metabolic Panel:  Recent Labs Lab 12/04/13 2258  12/05/13 0441 12/06/13 0234  NA 143 138 139  K 4.0 4.1 3.7  CL 99 94* 94*  CO2 25 19 26   GLUCOSE 200* 401* 332*  BUN 26* 30* 45*  CREATININE 1.57* 1.76* 1.97*  CALCIUM 9.1 9.1 9.2   Liver Function Tests:  Recent Labs Lab 12/04/13 2258  AST 25  ALT 6  ALKPHOS 65  BILITOT 0.7  PROT 6.8  ALBUMIN 3.4*   CBC:  Recent Labs Lab 12/04/13 2258 12/05/13 0441 12/06/13 0234  WBC 16.2* 14.8* 15.2*  NEUTROABS 14.0*  --   --   HGB 12.4 12.1 10.7*  HCT 39.3 38.9 34.6*  MCV 98.5 100.0 97.7  PLT 112* 109* 111*   Cardiac Enzymes:  Recent Labs Lab 12/04/13 2258 12/05/13 0441 12/05/13 0810 12/05/13 1502  TROPONINI 0.37* <0.30 <0.30 <0.30   BNP (last 3 results)  Recent Labs  12/04/13 2258  PROBNP 17479.0*   CBG:  Recent Labs Lab 12/05/13 0818 12/05/13 1553 12/05/13 2019 12/05/13 2202 12/06/13 0809  GLUCAP 342* 244* 238* 257* 256*    Recent Results (from the past 240 hour(s))  URINE CULTURE     Status: None   Collection Time    12/04/13 10:48 PM      Result Value Ref Range Status   Specimen Description URINE, CATHETERIZED   Final   Special Requests NONE   Final   Culture  Setup Time     Final   Value: 12/05/2013 00:04     Performed at [facility] Count  Final   Value: >=100,000 COLONIES/ML     Performed at [facility]   Culture     Final   Value: ESCHERICHIA COLI     Performed at [facility]   Report Status PENDING   Incomplete  CULTURE, BLOOD (ROUTINE X 2)     Status: None   Collection Time    12/04/13 10:58 PM      Result Value Ref Range Status   Specimen Description BLOOD BLOOD RIGHT FOREARM   Final   Special Requests     Final   Value: BOTTLES DRAWN AEROBIC AND ANAEROBIC 7CC AEROBIC 3CC ANAEROBIC   Culture  Setup Time     Final   Value: 12/05/2013 04:57     Performed at [facility]   Culture     Final   Value:        BLOOD CULTURE RECEIVED NO GROWTH TO DATE CULTURE WILL BE HELD FOR 5 DAYS  BEFORE ISSUING A FINAL NEGATIVE REPORT     Performed at [facility]   Report Status PENDING   Incomplete  CULTURE, BLOOD (ROUTINE X 2)     Status: None   Collection Time    12/04/13 10:58 PM      Result Value Ref Range Status   Specimen Description BLOOD BLOOD LEFT FOREARM   Final   Special Requests BOTTLES DRAWN AEROBIC AND ANAEROBIC 10CC EACH   Final   Culture  Setup Time     Final   Value: 12/05/2013 04:57     Performed at [facility]   Culture     Final   Value: GRAM POSITIVE COCCI IN CLUSTERS     Note: Gram Stain Report Called to,Read Back By and Verified With: LEE WORLEY 12/06/13 1242A FULKC     Performed at [facility]   Report Status PENDING   Incomplete  MRSA PCR SCREENING     Status: None   Collection Time    12/05/13  6:30 AM      Result Value Ref Range Status   MRSA by PCR NEGATIVE  NEGATIVE Final   Comment:            The GeneXpert MRSA Assay (FDA     approved for NASAL specimens     only), is one component of a     comprehensive MRSA colonization     surveillance program. It is not     intended to diagnose MRSA     infection nor to guide or     monitor treatment for     MRSA infections.     Studies:  Recent x-ray studies have been reviewed in detail by the Attending Physician       Junious SilkAllison Ellis, ANP [facility] Office  [phone] Pager [phone]  **If unable to reach the above provider after paging please contact the Flow Manager @ [phone]  On-Call/Text Page:      [email or web address]      password [email or web address]  If 7PM-7AM, please contact night-coverage [email or web address] Password [email or web address] 12/06/2013, 11:21 AM   LOS: 2 days   Have examined the patient and reviewed assessment and plan with ANP Revonda StandardAllison and agree with the above plan. Plan explained to patient and all questions answered

## 2013-12-06 NOTE — Progress Notes (Signed)
Patient ID: Salvatore MarvelGertrude S Steelman, female   DOB: [birth year], 80 y.o.   MRN: [id number]    Subjective:  Tremulous and dyspnea improved   Objective:  Filed Vitals:   12/06/13 0457 12/06/13 0812 12/06/13 0813 12/06/13 0900  BP:  115/53    Pulse:  50    Temp:   97.5 F (36.4 C)   TempSrc:   Oral   Resp:  24    Height:      Weight: 188 lb 0.8 oz (85.3 kg)     SpO2:  92%  89%    Intake/Output from previous day:  Intake/Output Summary (Last 24 hours) at 12/06/13 [id number] Last data filed at 12/06/13 0813  Gross per 24 hour  Intake    493 ml  Output   1025 ml  Net   -532 ml    Physical Exam: Affect appropriate Chronically ill white female  HEENT: normal Neck supple with no adenopathy JVP normal no bruits no thyromegaly Lungs rhonchi and  wheezing and good diaphragmatic motion Heart:  S1/S2 no murmur, no rub, gallop or click PMI normal Abdomen: benighn, BS positve, no tenderness, no AAA no bruit.  No HSM or HJR Distal pulses intact with no bruits No edema Neuro non-focal Skin warm and dry No muscular weakness   Lab Results: Basic Metabolic Panel:  Recent Labs  96/29/5204/15/15 0441 12/06/13 0234  NA 138 139  K 4.1 3.7  CL 94* 94*  CO2 19 26  GLUCOSE 401* 332*  BUN 30* 45*  CREATININE 1.76* 1.97*  CALCIUM 9.1 9.2   Liver Function Tests:  Recent Labs  12/04/13 2258  AST 25  ALT 6  ALKPHOS 65  BILITOT 0.7  PROT 6.8  ALBUMIN 3.4*   No results found for this basename: LIPASE, AMYLASE,  in the last 72 hours CBC:  Recent Labs  12/04/13 2258 12/05/13 0441 12/06/13 0234  WBC 16.2* 14.8* 15.2*  NEUTROABS 14.0*  --   --   HGB 12.4 12.1 10.7*  HCT 39.3 38.9 34.6*  MCV 98.5 100.0 97.7  PLT 112* 109* 111*   Cardiac Enzymes:  Recent Labs  12/05/13 0441 12/05/13 0810 12/05/13 1502  TROPONINI <0.30 <0.30 <0.30   Hemoglobin A1C:  Recent Labs  12/05/13 1605  HGBA1C 7.1*    Imaging: Dg Chest Port 1 View  12/04/2013   CLINICAL DATA:  Respiratory distress.   EXAM: PORTABLE CHEST - 1 VIEW  COMPARISON:  CT CHEST W/O CM dated 08/16/2011; DG CHEST 1V PORT dated 08/14/2011  FINDINGS: Advanced COPD again noted. There may be subtle infiltrates in the right upper lobe and right lower lobe. Parenchymal scarring is present bilaterally. No edema, pneumothorax or pleural effusion is identified. The heart size is normal. There is a stable appearance to a biventricular pacer.  IMPRESSION: Advanced COPD with potential infiltrates in the right upper lobe and right lower lobe.   Electronically Signed   By: Irish LackGlenn  Yamagata M.D.   On: 12/04/2013 23:44    Cardiac Studies:  ECG:    Telemetry: P synch pacing   Echo:  Study Conclusions  - Left ventricle: The cavity size was normal. Wall thickness was normal. Systolic function was normal. The estimated ejection fraction was in the range of 55% to 60%. Wall motion was normal; there were no regional wall motion abnormalities. The study is not technically sufficient to allow evaluation of LV diastolic function. - Ventricular septum: Septal motion showed paradox.   Medications:   . aspirin EC  81  mg Oral Daily  . azithromycin  250 mg Intravenous Q24H  . cefTRIAXone (ROCEPHIN)  IV  1 g Intravenous Q24H  . enoxaparin (LOVENOX) injection  30 mg Subcutaneous Q24H  . insulin aspart  0-9 Units Subcutaneous TID WC  . ipratropium-albuterol  3 mL Nebulization TID  . methylPREDNISolone (SOLU-MEDROL) injection  60 mg Intravenous Q12H  . [START ON 12/07/2013] vancomycin  1,000 mg Intravenous Q24H       Assessment/Plan:  Dyspnea:  CXR with COPD and ? Pneumonia no CHF.  EF normal by echo with paradoxic septal motion.  BNP elevated may relfect RV function.  Agree With holding diuretic for now with elevated Cr and clinical picture more consistent with COPD exacerbation.  Continue Azithromycin and Rocephin  With  Steroids and inhalers.   Pacer:  Normal RV lead function.  Dr Ladona Ridgelaylor felt some of her "CHF" was related to  asynchronous RV contraction and was considering LV lead revision Current lead not functional due to low diaphragmatic stimulation Will ask EP to see while in hospital  Wendall Stadeeter C Ahsha Hinsley 12/06/2013, 9:39 AM

## 2013-12-07 LAB — BASIC METABOLIC PANEL
BUN: 53 mg/dL — ABNORMAL HIGH (ref 6–23)
CHLORIDE: 99 meq/L (ref 96–112)
CO2: 26 mEq/L (ref 19–32)
Calcium: 9.2 mg/dL (ref 8.4–10.5)
Creatinine, Ser: 1.61 mg/dL — ABNORMAL HIGH (ref 0.50–1.10)
GFR calc Af Amer: 34 mL/min — ABNORMAL LOW (ref >90)
GFR calc non Af Amer: 29 mL/min — ABNORMAL LOW (ref >90)
Glucose, Bld: 298 mg/dL — ABNORMAL HIGH (ref 70–99)
Potassium: 3.7 mEq/L (ref 3.7–5.3)
SODIUM: 142 meq/L (ref 137–147)

## 2013-12-07 LAB — GLUCOSE, CAPILLARY
GLUCOSE-CAPILLARY: 289 mg/dL — AB (ref 70–99)
GLUCOSE-CAPILLARY: 276 mg/dL — AB (ref 70–99)
GLUCOSE-CAPILLARY: 389 mg/dL — AB (ref 70–99)
Glucose-Capillary: 246 mg/dL — ABNORMAL HIGH (ref 70–99)
Glucose-Capillary: 215 mg/dL — ABNORMAL HIGH (ref 70–99)
Glucose-Capillary: 258 mg/dL — ABNORMAL HIGH (ref 70–99)

## 2013-12-07 LAB — CBC
HCT: 36.6 % (ref 36.0–46.0)
Hemoglobin: 11.3 g/dL — ABNORMAL LOW (ref 12.0–15.0)
MCH: 30.4 pg (ref 26.0–34.0)
MCHC: 30.9 g/dL (ref 30.0–36.0)
MCV: 98.4 fL (ref 78.0–100.0)
PLATELETS: 154 10*3/uL (ref 150–400)
RBC: 3.72 MIL/uL — AB (ref 3.87–5.11)
RDW: 13.6 % (ref 11.5–15.5)
WBC: 12.8 10*3/uL — AB (ref 4.0–10.5)

## 2013-12-07 LAB — CULTURE, BLOOD (ROUTINE X 2)

## 2013-12-07 LAB — URINE CULTURE: Colony Count: >=100000

## 2013-12-07 MED ORDER — AZITHROMYCIN 250 MG PO TABS
250.0000 mg | ORAL_TABLET | Freq: Every day | ORAL | Status: DC
  Administered 2013-12-07 – 2013-12-09 (×3): 250 mg via ORAL
  Filled 2013-12-07 (×4): qty 1

## 2013-12-07 MED ORDER — METHYLPREDNISOLONE SODIUM SUCC 40 MG IJ SOLR
40.0000 mg | Freq: Two times a day (BID) | INTRAMUSCULAR | Status: AC
  Administered 2013-12-07: 40 mg via INTRAVENOUS
  Filled 2013-12-07 (×2): qty 1

## 2013-12-07 MED ORDER — PREDNISONE 10 MG PO TABS
10.0000 mg | ORAL_TABLET | Freq: Every day | ORAL | Status: DC
  Administered 2013-12-08: 10 mg via ORAL
  Filled 2013-12-07 (×2): qty 1

## 2013-12-07 NOTE — Evaluation (Signed)
Physical Therapy Evaluation Patient Details Name: [name] MRN: [id number] DOB: 10/24/1931 Today's Date: 12/07/2013   History of Present Illness  This 80 yo female with h/o COPD who is on 5L 02 at home was admitted with respiratory distress and initially required BiPaP.  Dx:  PNA; UTI; ? sepsis; ? NSTEMI as her troponins were initially elevated.    Clinical Impression  Pt admitted with pneumonia with h/o COPD and uses home O2 at 5L. Pt currently with functional limitations due to the deficits listed below (see PT Problem List). Unclear if pt will have necessary assist at home on discharge. She currently will need supervision/assist with mobility. Pt will benefit from skilled PT to increase their independence and safety with mobility to allow discharge to the venue listed below.       Follow Up Recommendations Home health PT;Supervision for mobility/OOB    Equipment Recommendations  None recommended by PT    Recommendations for Other Services OT consult     Precautions / Restrictions Precautions Precautions: Fall      Mobility  Bed Mobility Overal bed mobility: Needs Assistance Bed Mobility: Supine to Sit     Supine to sit: Supervision;HOB elevated     General bed mobility comments: no rail, moves quickly, supervision for safety  Transfers Overall transfer level: Needs assistance Equipment used: None Transfers: Sit to/from BJ'sStand;Stand Pivot Transfers Sit to Stand: Min guard Stand pivot transfers: Min assist       General transfer comment: began to pivot to her left with staggering step and lean to her left  Ambulation/Gait                Stairs            Wheelchair Mobility    Modified Rankin (Stroke Patients Only)       Balance Overall balance assessment: Needs assistance Sitting-balance support: No upper extremity supported;Feet unsupported Sitting balance-Leahy Scale: Good     Standing balance support: No upper extremity  supported Standing balance-Leahy Scale: Fair Standing balance comment: static standing steady;                              Pertinent Vitals/Pain SaO2 on 5L at rest 97% On standing with 5L decr to 81% (?true reading, however required 1 minute to incr to 88%) Sitting after 4 minutes 97%     Home Living Family/patient expects to be discharged to:: Private residence Living Arrangements: Children Available Help at Discharge: Family;Available PRN/intermittently (son works days) Type of Home: House Home Access: Stairs to enter Entrance Stairs-Rails: Right Entrance Stairs-Number of Steps: 2 Home Layout: One level Home Equipment: Walker - 4 wheels;Cane - single point;Grab bars - tub/shower Additional Comments: Reports equipment was for her husband; denies h/o balance problems    Prior Function Level of Independence: Needs assistance   Gait / Transfers Assistance Needed: walks with no device; denies balanace problems  ADL's / Homemaking Assistance Needed: children do grocery shopping; has someone come clean   Comments: Pt denies falls     Hand Dominance   Dominant Hand: Right    Extremity/Trunk Assessment   Upper Extremity Assessment: Defer to OT evaluation           Lower Extremity Assessment: Generalized weakness      Cervical / Trunk Assessment: Normal  Communication   Communication: HOH  Cognition Arousal/Alertness: Awake/alert Behavior During Therapy: Flat affect Overall Cognitive Status: Within Functional Limits for  tasks assessed Area of Impairment: Safety/judgement         Safety/Judgement: Decreased awareness of deficits     General Comments: Pt with dyspnea 4/4 when donning socks and states "I do better at home than I do here"... " at home I can walk around fine, and do what I need to do"    General Comments General comments (skin integrity, edema, etc.): Pt instructed in bil. UE shoulder flexion - perform 5-10 reps every 2-3 hours.  She  performed 2 sets with long rest break in between and DOE 3/4    Exercises General Exercises - Upper Extremity Shoulder Flexion: AROM;Both;10 reps;Seated      Assessment/Plan    PT Assessment Patient needs continued PT services  PT Diagnosis Difficulty walking;Generalized weakness   PT Problem List Decreased strength;Decreased activity tolerance;Decreased balance;Decreased mobility;Decreased knowledge of use of DME;Cardiopulmonary status limiting activity  PT Treatment Interventions DME instruction;Gait training;Functional mobility training;Therapeutic activities;Balance training;Patient/family education   PT Goals (Current goals can be found in the Care Plan section) Acute Rehab PT Goals Patient Stated Goal: to go home PT Goal Formulation: With patient Time For Goal Achievement: 12/14/13 Potential to Achieve Goals: Good    Frequency Min 3X/week   Barriers to discharge Decreased caregiver support pt typically home alone during day    Co-evaluation               End of Session Equipment Utilized During Treatment: Gait belt;Oxygen Activity Tolerance: Treatment limited secondary to medical complications (Comment) Patient left: in chair;with call bell/phone within reach;with nursing/sitter in room Nurse Communication: Mobility status;Other (comment) (desaturates with activity)         Time: 1205-1221 PT Time Calculation (min): 16 min   Charges:   PT Evaluation $Initial PT Evaluation Tier I: 1 Procedure     PT G CodesScherrie [name]:          [name] 12/07/2013, 2:42 PM Pager [phone]

## 2013-12-07 NOTE — Progress Notes (Signed)
[facility]  [name] NFA:[id number] DOB: Aug 07, 1932 DOA: 12/04/2013 PCP: [name],[name], MD  Time spent : 35 minutes  Brief narrative: 80 year old female patient with multiple medical problems. Chronically on 5 L nasal cannula oxygen at home for her COPD. She also has a history of chronic kidney disease as well as prior CHF and history of permanent pacemaker. On the date of admission her children had been attempting to contact her by phone for several hours but when they were unable to reach her they sent someone to check on the patient. Apparently on the morning of admission she was fine. The person that was sent to check on the patient found her at home on room air and having difficulty breathing noting patient is supposed to be on chronic oxygen. EMS was called to the home and upon their assessment her oxygen saturations were in the 70s. She was immediately placed on BiPAP and brought to the emergency department. Family members confirm patient has been having upper respiratory symptoms consisting of progressive coughing and progressive difficulty breathing. After the above treatment measures were initiated patient became more responsive and alert and reported her shortness of breath had improved. She was also noted to be febrile in the emergency department. Her white count was elevated at 16,200 and a pro BNP was greater than 17,000 without a baseline for comparison.  HPI/Subjective: Awake and still coughing, no CP- has not been OOB to the chair yet.  Reports that she feels much better.  Assessment/Plan:  Acute on chronic respiratory failure with hypoxia:   A) COPD (chronic obstructive pulmonary disease)   B) PNA (pneumonia) / right upper and lower lobe infiltrates   C) Possible Congestive heart failure, unspecified -Had similar presentation during hospitalization in 2012 - required intubation at that time -has weaned from BiPAP to  baseline of 5L oxygen -decrease IV steroids from 60 to 40 mg IV BID-hopefully can transition to home Prednisone soon -Last echocardiogram 2012 revealed mild RV dilatation with preserved LV function and no evidence of diastolic dysfunction - repeat echo with preserved LV fnx but inadequate to eval for DD- dry with CVP 3- no RV/RA dilatation but paradoxical septal wall motion (see below) -Continue supportive care including nebulizers and IS-OOB to chair and anbx's -Patient on chronic prednisone as well as Singulair -chest x-ray was more consistent with pneumonia    E.Coli UTI -cont empiric anbxs-pan sensitive  Thrombocytopenia -has resolved and was likely due to GN infection  Coag negative positive blood cx's -1/2 cx's positive so c/w contaminant -DC empiric Vanco   Volume depletion -BUN/Scr have worsened with attempts to diurese -CVP on ECHO 3 -dc'd Lasix and began gentle IVF hydration 4/16  DM  -CBGs poorly controlled due to IV steroids -Not on diabetic meds prior to admission -provide sliding scale insulin -HgbA1c 7.1  ? NSTEMI (non-ST elevated myocardial infarction) -Point-of-care troponin mildly elevated at 0.37 but subsequent troponins have been normal  PACEMAKER - [facility] -Followed by Dr. Sharrell KuGreg Taylor as an outpatient -Recent evaluation January 2015 revealed left ventricular lead turned off due to malfunction/inability to program -Dr. Ladona Ridgelaylor felt patient's multifactorial dyspnea might be related to desynchronous RV pacing so plan was to review angios and consider potentially proceeding with LV lead revision -ECHO shows septal motion paradoxical so they have consulted EP  DVT prophylaxis: Lovenox Code Status: Full Family Communication: No family at bedside Disposition Plan/Expected LOS: Stepdown-mobilize-PT/OT  Consultants: None  Procedures: 2-D  echocardiogram  - Left ventricle: The cavity size was normal. Wall thickness was normal. Systolic function was normal. The  estimated ejection fraction was in the range of 55% to 60%. Wall motion was normal; there were no regional wall motion abnormalities. The study is not technically sufficient to allow evaluation of LV diastolic function. - Ventricular septum: Septal motion showed paradox.  Antibiotics: Zithromax 4/14 >>> Rocephin 4/14 >>> Vanco 4/16 >>> 4/17  Objective: Blood pressure 124/60, pulse 51, temperature 97.7 F (36.5 C), temperature source Oral, resp. rate 24, height 5\' 3"  (1.6 m), weight 196 lb 10.4 oz (89.2 kg), SpO2 96.00%.  Intake/Output Summary (Last 24 hours) at 12/07/13 1310 Last data filed at 12/07/13 1139  Gross per 24 hour  Intake    690 ml  Output   1476 ml  Net   -786 ml   Exam: General: No acute respiratory distress Lungs: Clear to auscultation bilaterally with faint  expiratory wheeze, 5 L with wet cough Cardiovascular: Regular rate and rhythm without murmur gallop or rub normal S1 and S2, no peripheral edema or JVD Abdomen: Nontender, nondistended, soft, bowel sounds positive, no rebound, no ascites, no appreciable mass Musculoskeletal: No significant cyanosis, clubbing of bilateral lower extremities Neurological: Alert and oriented x 3, moves all extremities x 4 without focal neurological deficits, CN 2-12 intact   Scheduled Meds:  Scheduled Meds: . aspirin EC  81 mg Oral Daily  . azithromycin  250 mg Oral QHS  . cefTRIAXone (ROCEPHIN)  IV  1 g Intravenous Q24H  . enoxaparin (LOVENOX) injection  30 mg Subcutaneous Q24H  . insulin aspart  0-9 Units Subcutaneous TID WC  . ipratropium-albuterol  3 mL Nebulization TID  . methylPREDNISolone (SOLU-MEDROL) injection  40 mg Intravenous Q12H   Data Reviewed: Basic Metabolic Panel:  Recent Labs Lab 12/04/13 2258 12/05/13 0441 12/06/13 0234 12/07/13 0905  NA 143 138 139 142  K 4.0 4.1 3.7 3.7  CL 99 94* 94* 99  CO2 25 19 26 26   GLUCOSE 200* 401* 332* 298*  BUN 26* 30* 45* 53*  CREATININE 1.57* 1.76* 1.97* 1.61*    CALCIUM 9.1 9.1 9.2 9.2   Liver Function Tests:  Recent Labs Lab 12/04/13 2258  AST 25  ALT 6  ALKPHOS 65  BILITOT 0.7  PROT 6.8  ALBUMIN 3.4*   CBC:  Recent Labs Lab 12/04/13 2258 12/05/13 0441 12/06/13 0234 12/07/13 0905  WBC 16.2* 14.8* 15.2* 12.8*  NEUTROABS 14.0*  --   --   --   HGB 12.4 12.1 10.7* 11.3*  HCT 39.3 38.9 34.6* 36.6  MCV 98.5 100.0 97.7 98.4  PLT 112* 109* 111* 154   Cardiac Enzymes:  Recent Labs Lab 12/04/13 2258 12/05/13 0441 12/05/13 0810 12/05/13 1502  TROPONINI 0.37* <0.30 <0.30 <0.30   BNP (last 3 results)  Recent Labs  12/04/13 2258  PROBNP 17479.0*   CBG:  Recent Labs Lab 12/06/13 1618 12/06/13 1656 12/06/13 2153 12/07/13 0831 12/07/13 1141  GLUCAP 246* 234* 289* 276* 389*    Recent Results (from the past 240 hour(s))  URINE CULTURE     Status: None   Collection Time    12/04/13 10:48 PM      Result Value Ref Range Status   Specimen Description URINE, CATHETERIZED   Final   Special Requests NONE   Final   Culture  Setup Time     Final   Value: 12/05/2013 00:04     Performed at [facility]   Colony  Count     Final   Value: >=100,000 COLONIES/ML     Performed at [facility]   Culture     Final   Value: ESCHERICHIA COLI     Performed at [facility]   Report Status 12/07/2013 FINAL   Final   Organism ID, Bacteria ESCHERICHIA COLI   Final  CULTURE, BLOOD (ROUTINE X 2)     Status: None   Collection Time    12/04/13 10:58 PM      Result Value Ref Range Status   Specimen Description BLOOD BLOOD RIGHT FOREARM   Final   Special Requests     Final   Value: BOTTLES DRAWN AEROBIC AND ANAEROBIC 7CC AEROBIC 3CC ANAEROBIC   Culture  Setup Time     Final   Value: 12/05/2013 04:57     Performed at [facility]   Culture     Final   Value:        BLOOD CULTURE RECEIVED NO GROWTH TO DATE CULTURE WILL BE HELD FOR 5 DAYS BEFORE ISSUING A FINAL NEGATIVE REPORT     Performed at [facility]   Report Status PENDING   Incomplete  CULTURE, BLOOD (ROUTINE X 2)     Status: None   Collection Time    12/04/13 10:58 PM      Result Value Ref Range Status   Specimen Description BLOOD BLOOD LEFT FOREARM   Final   Special Requests BOTTLES DRAWN AEROBIC AND ANAEROBIC 10CC EACH   Final   Culture  Setup Time     Final   Value: 12/05/2013 04:57     Performed at [facility]   Culture     Final   Value: STAPHYLOCOCCUS SPECIES (COAGULASE NEGATIVE)     Note: THE SIGNIFICANCE OF ISOLATING THIS ORGANISM FROM A SINGLE SET OF BLOOD CULTURES WHEN MULTIPLE SETS ARE DRAWN IS UNCERTAIN. PLEASE NOTIFY THE MICROBIOLOGY DEPARTMENT WITHIN ONE WEEK IF SPECIATION AND SENSITIVITIES ARE REQUIRED.     Note: Gram Stain Report Called to,Read Back By and Verified With: LEE WORLEY 12/06/13 1242A FULKC     Performed at [facility]   Report Status 12/07/2013 FINAL   Final  MRSA PCR SCREENING     Status: None   Collection Time    12/05/13  6:30 AM      Result Value Ref Range Status   MRSA by PCR NEGATIVE  NEGATIVE Final   Comment:            The GeneXpert MRSA Assay (FDA     approved for NASAL specimens     only), is one component of a     comprehensive MRSA colonization     surveillance program. It is not     intended to diagnose MRSA     infection nor to guide or     monitor treatment for     MRSA infections.     Studies:  Recent x-ray studies have been reviewed in detail by the Attending Physician       Junious Silk, ANP [facility] Office  [phone] Pager [phone]  **If unable to reach the above provider after paging please contact the Flow Manager @ [phone]  On-Call/Text Page:      [email or web address]      password [email or web address]  If 7PM-7AM, please contact night-coverage [email or web address] Password [email or web address] 12/07/2013, 1:10 PM   LOS: 3 days   I have personally examined this patient and reviewed the entire database.  I have reviewed the above note, made any necessary  editorial changes, and agree with its content.  Lonia Blood, MD [facility]

## 2013-12-07 NOTE — Consult Note (Signed)
ELECTROPHYSIOLOGY CONSULT NOTE    Patient ID: [name] MRN: [id number], DOB/AGE: 05/08/1932 80 y.o.  Admit date: 12/04/2013 Date of Consult: 12-07-13  Primary Physician: Nadean Corwin, MD Electrophysiologist: Ladona Ridgel  Reason for Consultation: evaluate device, shortness of breath  HPI:  [name] is a 80 y.o. female with a past medical history of severe COPD (on chronic home O2), hypertension, diabetes, and complete heart block (s/p MDT CRTP implanted 2009 - LV lead turned off due to diaphragmatic stimulation).  She states that several days before admission, she had increased cough and low grade fever.  On the day of admission, her children were unable to reach her by phone and she was found without her oxygen and in respiratory distress.  She was placed on Bipap by EMS and transferred to [facility] for further evaluation.  Her WBC was elevated at 16.2K and her BNP was 17K with elevated istat troponin (all subsequent measurements normal).    She has been treated with antibiotics, steroids, and nebulizer with symptomatic improvement.  She still has productive cough and dyspnea on exertion. Echo this admission demonstrated EF 55-60%, no RWMA, LA 32.  Lab work has also been notable for renal insufficiency.   At last office visit with Dr Ladona Ridgel, it was felt that it would be reasonable to consider LV lead revision.  EP has been asked to evaluate during this admission for that reason.    She denies chest pain, lower extremity edema, palpitations, recent GI illness.  ROS is otherwise negative.    Device interrogation today demonstrates atrial fibrillation with ventricular pacing.  Otherwise normal device function.  AF duration 42 hours 13 minutes.  AV delays shortened today to 180/160 due to complete heart block and 100% RV pacing.    Past Medical History  Diagnosis Date  . AV block, complete   . CHF (congestive heart failure)   . AAA (abdominal aortic aneurysm)   . Dyslipidemia    . History of colonic polyps   . Anxiety and depression   . Diverticulosis of colon   . Asthma   . Acute renal failure   . Atrial flutter   . Hyperglycemia   . UTI (lower urinary tract infection)   . COPD (chronic obstructive pulmonary disease)   . HTN (hypertension)   . DM (diabetes mellitus)   . GERD (gastroesophageal reflux disease)   . DVT (deep venous thrombosis)   . B12 deficiency   . Angiodysplasia of intestine (without mention of hemorrhage)      Surgical History:  Past Surgical History  Procedure Laterality Date  . Tubal ligation    . Abdominal hysterectomy    . Appendectomy    . Pacemaker insertion      medtronic   . Colonoscopy  02/06/2002    119.14,782.95  . Bladder suspension       Prescriptions prior to admission  Medication Sig Dispense Refill  . albuterol (VENTOLIN HFA) 108 (90 BASE) MCG/ACT inhaler Inhale 2 puffs into the lungs every 6 (six) hours as needed. For shortness of breath.      Marland Kitchen aspirin 81 MG EC tablet Take 81 mg by mouth daily.        Marland Kitchen azithromycin (ZITHROMAX) 250 MG tablet Take 1 tablet by mouth daily.      . Cholecalciferol (VITAMIN D) 2000 UNITS tablet Take 5,000 Units by mouth daily.       Marland Kitchen esomeprazole (NEXIUM) 40 MG capsule Take 40 mg by mouth daily before breakfast.      .  furosemide (LASIX) 40 MG tablet Take 40 mg by mouth daily.      . montelukast (SINGULAIR) 10 MG tablet Take 10 mg by mouth daily.        . predniSONE (DELTASONE) 10 MG tablet Take 10 mg by mouth daily with breakfast.      . tiotropium (SPIRIVA) 18 MCG inhalation capsule Place 18 mcg into inhaler and inhale daily.        . vitamin B-12 (CYANOCOBALAMIN) 1000 MCG tablet Take 1,000 mcg by mouth daily.        Inpatient Medications:  . aspirin EC  81 mg Oral Daily  . azithromycin  250 mg Oral QHS  . cefTRIAXone (ROCEPHIN)  IV  1 g Intravenous Q24H  . enoxaparin (LOVENOX) injection  30 mg Subcutaneous Q24H  . insulin aspart  0-9 Units Subcutaneous TID WC  .  ipratropium-albuterol  3 mL Nebulization TID  . methylPREDNISolone (SOLU-MEDROL) injection  40 mg Intravenous Q12H    Allergies:  Allergies  Allergen Reactions  . Advair Diskus [Fluticasone-Salmeterol]     Itching  . Biaxin [Clarithromycin]     N/V  . Ciprofloxacin     N/V  . Doxycycline Other (See Comments)    Inside of mouth and tongue red and peeled  . Keflex [Cephalexin]     N/V  . Ketek [Telithromycin]     N/V  . Levaquin [Levofloxacin In D5w]     N/V  . Macrobid [Nitrofurantoin]     N/V  . Penicillins     Rash  . Sulfonamide Derivatives     History   Social History  . Marital Status: Widowed    Spouse Name: N/A    Number of Children: N/A  . Years of Education: N/A   Occupational History  . Not on file.   Social History Main Topics  . Smoking status: Former Smoker    Quit date: 08/23/1988  . Smokeless tobacco: Never Used     Comment: quit in 1990, smoked 1 ppd x 30 years   . Alcohol Use: No  . Drug Use: No  . Sexual Activity: Not on file   Other Topics Concern  . Not on file   Social History Narrative   Married, 4 children; retired Associate Professorretail worker.      Family History  Problem Relation Age of Onset  . Emphysema Maternal Uncle     multiple   . Asthma Daughter   . Colon cancer Sister     alive at 6880   . Heart disease Father   . CVA Mother   . Heart disease Mother      Physical Exam  Chronically ill and diskempt appearing woman, NAD HEENT: Unremarkable,[place], AT Neck:  6 JVD, no thyromegally Back:  No CVA tenderness Lungs: - scattered rales and rhonchi with no wheezes. Egophony is present. HEART:  Regular rate rhythm, no murmurs, no rubs, no clicks Abd:  soft, obese, positive bowel sounds, no organomegally, no rebound, no guarding Ext:  2 plus pulses, 3+ edema, no cyanosis, no clubbing Skin:  No rashes no nodules Neuro:  CN II through XII intact, motor grossly intact   Labs:   Lab Results  Component Value Date   WBC 12.8* 12/07/2013    HGB 11.3* 12/07/2013   HCT 36.6 12/07/2013   MCV 98.4 12/07/2013   PLT 154 12/07/2013    Recent Labs Lab 12/04/13 2258  12/07/13 0905  NA 143  < > 142  K 4.0  < >  3.7  CL 99  < > 99  CO2 25  < > 26  BUN 26*  < > 53*  CREATININE 1.57*  < > 1.61*  CALCIUM 9.1  < > 9.2  PROT 6.8  --   --   BILITOT 0.7  --   --   ALKPHOS 65  --   --   ALT 6  --   --   AST 25  --   --   GLUCOSE 200*  < > 298*  < > = values in this interval not displayed.   Radiology/Studies: Dg Chest Port 1 View 12/04/2013   CLINICAL DATA:  Respiratory distress.  EXAM: PORTABLE CHEST - 1 VIEW  COMPARISON:  CT CHEST W/O CM dated 08/16/2011; DG CHEST 1V PORT dated 08/14/2011  FINDINGS: Advanced COPD again noted. There may be subtle infiltrates in the right upper lobe and right lower lobe. Parenchymal scarring is present bilaterally. No edema, pneumothorax or pleural effusion is identified. The heart size is normal. There is a stable appearance to a biventricular pacer.  IMPRESSION: Advanced COPD with potential infiltrates in the right upper lobe and right lower lobe.   Electronically Signed   By: Irish LackGlenn  Yamagata M.D.   On: 12/04/2013 23:44   EKG: atrial fib with ventricular pacing  TELEMETRY: atrial fibrillation with ventricular pacing  DEVICE HISTORY: MDT CRTP implanted 2009 by Dr Ladona Ridgelaylor - LV lead off due to diaphragmatic stimulation. Her device is functioning normally except for ventricular lead.  CHADS2VASC score is 485 (age, sex, hypertension, diabetes)  A/P  1. Acute respiratory failure,  2. Pneumonia 3. Acute on chronic diastolic CHF 3. Recurrent atrial fib with a controlled VR 4. Morbid obesity 5. S/p PPM insertion with normal function but no LV lead pacing due to diaphragmatic stimulation Rec: At this point would suggest treatment of underlying pulmonary problem. She will return to my office in several weeks. With preserved LV function, I would be loath to retry insertion of a new LV lead. Her ventricular rate in  atrial fib is well controlled. Her stroke risk is very high and would consider initiating anti-coagulation once we know she is not likely to fall.   Leonia ReevesGregg Taylor,M.D.

## 2013-12-07 NOTE — Progress Notes (Signed)
Results for Salvatore MarvelBIGGS, Nakyla S (MRN [id number]) as of 12/07/2013 14:13  Ref. Range 12/06/2013 16:18 12/06/2013 16:56 12/06/2013 21:53 12/07/2013 08:31 12/07/2013 11:41  Glucose-Capillary Latest Range: 70-99 mg/dL 409246 (H) 811234 (H) 914289 (H) 276 (H) 389 (H)   CBGs continue to rise.  Recommend that Novolog correction scale be increased to MODERATE TID & HS.  May need to add Lantus 10-15 units daily if CBGs continue to be greater than 180 mg/dl.  Smith MinceKendra Ronnetta Currington RN BSN CDE

## 2013-12-07 NOTE — Evaluation (Signed)
Occupational Therapy Evaluation Patient Details Name: [name] MRN: [id number] DOB: 1932-08-12 Today's Date: 12/07/2013    History of Present Illness This 80 yo female with h/o COPD who is on 5L 02 was admitted with respiratory distress.  Dx:  PNA; UTI; ? sepsis; ? NSTEMI as her troponins were initially elevated.    Clinical Impression   Pt admitted with above. She demonstrates the below listed deficits and will benefit from continued OT to maximize safety and independence with BADLs.  Pt presents to OT with generalized weakness and deconditioning.  She fatigues very rapidly with minimal activity.  DOE 4/4 with sats decreasing to 84% when attempting to doff socks.  She will likely need 24 hour assist at discharge, at least initially.  Dtr initially states that all children work during the day, but they would check in and out on her.  Explained that pt will likely need 24 hour assist, she states they will arrange that.  Will continue to follow     Follow Up Recommendations  Supervision/Assistance - 24 hour;Home health OT    Equipment Recommendations  Tub/shower seat    Recommendations for Other Services       Precautions / Restrictions Precautions Precautions: Fall      Mobility Bed Mobility                  Transfers                      Balance                                            ADL Overall ADL's : Needs assistance/impaired Eating/Feeding: Modified independent;Sitting   Grooming: Wash/dry hands;Wash/dry face;Oral care;Brushing hair;Set up;Sitting   Upper Body Bathing: Moderate assistance;Sitting   Lower Body Bathing: Maximal assistance;Sit to/from stand   Upper Body Dressing : Maximal assistance;Sitting   Lower Body Dressing: Maximal assistance;Sit to/from stand   Toilet Transfer: Minimal assistance;Stand-pivot;BSC   Toileting- Clothing Manipulation and Hygiene: Maximal assistance;Sit to/from stand          General ADL Comments: Pt requires assist due to poor activity tolerance DOE 4/4 with basic task such as attempting to don socks with sats decreased to 84% on 5L.  Pt instructed in pursed lip breathing and required increased time to recover.       Vision                     Perception     Praxis      Pertinent Vitals/Pain Denies pain      Hand Dominance Right   Extremity/Trunk Assessment Upper Extremity Assessment Upper Extremity Assessment: Generalized weakness   Lower Extremity Assessment Lower Extremity Assessment: Defer to PT evaluation       Communication Communication Communication: HOH   Cognition Arousal/Alertness: Awake/alert Behavior During Therapy: Flat affect Overall Cognitive Status: Impaired/Different from baseline Area of Impairment: Safety/judgement         Safety/Judgement: Decreased awareness of deficits     General Comments: Pt with dyspnea 4/4 when donning socks and states "I do better at home than I do here"... " at home I can walk around fine, and do what I need to do"   General Comments       Exercises Exercises: General Upper Extremity     Shoulder Instructions  Home Living Family/patient expects to be discharged to:: Private residence Living Arrangements: Children Available Help at Discharge: Family;Available PRN/intermittently Type of Home: House Home Access: Stairs to enter Entergy CorporationEntrance Stairs-Number of Steps: 2 Entrance Stairs-Rails: Right Home Layout: One level     Bathroom Shower/Tub: Walk-in shower;Tub/shower unit Shower/tub characteristics: Curtain FirefighterBathroom Toilet: Standard     Home Equipment: Environmental consultantWalker - 4 wheels;Cane - single point;Grab bars - tub/shower   Additional Comments: Pt reports she takes tub baths      Prior Functioning/Environment Level of Independence: Needs assistance  Gait / Transfers Assistance Needed: walks with no device; denies balanace problems ADL's / Homemaking Assistance Needed:  children do grocery shopping; has someone come clean    Comments: Pt denies falls    OT Diagnosis: Generalized weakness   OT Problem List: Decreased strength;Decreased activity tolerance;Decreased safety awareness;Decreased knowledge of use of DME or AE;Cardiopulmonary status limiting activity   OT Treatment/Interventions: Self-care/ADL training;Therapeutic exercise;Energy conservation;DME and/or AE instruction;Therapeutic activities;Patient/family education    OT Goals(Current goals can be found in the care plan section) Acute Rehab OT Goals Patient Stated Goal: to go home OT Goal Formulation: With patient Time For Goal Achievement: 12/21/13 Potential to Achieve Goals: Good ADL Goals Pt Will Perform Grooming: with min assist;standing Pt Will Perform Upper Body Bathing: with supervision;sitting Pt Will Perform Lower Body Bathing: with min assist;sit to/from stand Pt Will Perform Upper Body Dressing: with min assist;sitting Pt Will Perform Lower Body Dressing: with min assist;sit to/from stand Pt Will Transfer to Toilet: with min guard assist;ambulating;grab bars;regular height toilet Pt Will Perform Toileting - Clothing Manipulation and hygiene: with min guard assist;sit to/from stand Pt/caregiver will Perform Home Exercise Program: Increased strength;Both right and left upper extremity  OT Frequency: Min 2X/week   Barriers to D/C:    unsure if family able to provide 24 hour assist       Co-evaluation              End of Session Equipment Utilized During Treatment: Oxygen Nurse Communication: Mobility status  Activity Tolerance: Patient limited by fatigue;Other (comment) (DOE 4/4) Patient left: in chair;with call bell/phone within reach   Time: 1240-1301 OT Time Calculation (min): 21 min Charges:  OT General Charges $OT Visit: 1 Procedure OT Evaluation $Initial OT Evaluation Tier I: 1 Procedure OT Treatments $Therapeutic Activity: 8-22 mins G-Codes:    Harlei Lehrmann M  Apryle Stowell 12/07/2013, 1:40 PM

## 2013-12-08 LAB — BASIC METABOLIC PANEL
BUN: 50 mg/dL — AB (ref 6–23)
CHLORIDE: 104 meq/L (ref 96–112)
CO2: 26 mEq/L (ref 19–32)
CREATININE: 1.5 mg/dL — AB (ref 0.50–1.10)
Calcium: 9.2 mg/dL (ref 8.4–10.5)
GFR calc Af Amer: 37 mL/min — ABNORMAL LOW (ref >90)
GFR, EST NON AFRICAN AMERICAN: 32 mL/min — AB (ref >90)
GLUCOSE: 308 mg/dL — AB (ref 70–99)
Potassium: 4.6 mEq/L (ref 3.7–5.3)
Sodium: 143 mEq/L (ref 137–147)

## 2013-12-08 LAB — GLUCOSE, CAPILLARY
Glucose-Capillary: 260 mg/dL — ABNORMAL HIGH (ref 70–99)
Glucose-Capillary: 240 mg/dL — ABNORMAL HIGH (ref 70–99)
Glucose-Capillary: 288 mg/dL — ABNORMAL HIGH (ref 70–99)
Glucose-Capillary: 314 mg/dL — ABNORMAL HIGH (ref 70–99)
Glucose-Capillary: 212 mg/dL — ABNORMAL HIGH (ref 70–99)

## 2013-12-08 MED ORDER — SALINE SPRAY 0.65 % NA SOLN
1.0000 | NASAL | Status: DC | PRN
  Administered 2013-12-09: 1 via NASAL
  Filled 2013-12-08: qty 44

## 2013-12-08 MED ORDER — METHYLPREDNISOLONE SODIUM SUCC 40 MG IJ SOLR
40.0000 mg | Freq: Two times a day (BID) | INTRAMUSCULAR | Status: DC
  Administered 2013-12-08 – 2013-12-09 (×3): 40 mg via INTRAVENOUS
  Filled 2013-12-08 (×4): qty 1

## 2013-12-08 MED ORDER — IPRATROPIUM-ALBUTEROL 0.5-2.5 (3) MG/3ML IN SOLN
3.0000 mL | Freq: Four times a day (QID) | RESPIRATORY_TRACT | Status: DC
  Administered 2013-12-08 – 2013-12-10 (×8): 3 mL via RESPIRATORY_TRACT
  Filled 2013-12-08 (×7): qty 3

## 2013-12-08 MED ORDER — PANTOPRAZOLE SODIUM 40 MG PO TBEC
40.0000 mg | DELAYED_RELEASE_TABLET | Freq: Two times a day (BID) | ORAL | Status: DC
  Administered 2013-12-08 – 2013-12-13 (×10): 40 mg via ORAL
  Filled 2013-12-08 (×9): qty 1

## 2013-12-08 MED ORDER — ALBUTEROL SULFATE (2.5 MG/3ML) 0.083% IN NEBU
2.5000 mg | INHALATION_SOLUTION | RESPIRATORY_TRACT | Status: DC | PRN
  Administered 2013-12-11: 2.5 mg via RESPIRATORY_TRACT
  Filled 2013-12-08: qty 3

## 2013-12-08 MED ORDER — INSULIN GLARGINE 100 UNIT/ML ~~LOC~~ SOLN
10.0000 [IU] | Freq: Every day | SUBCUTANEOUS | Status: DC
  Administered 2013-12-08: 10 [IU] via SUBCUTANEOUS
  Filled 2013-12-08 (×2): qty 0.1

## 2013-12-08 MED ORDER — INSULIN ASPART 100 UNIT/ML ~~LOC~~ SOLN
3.0000 [IU] | Freq: Three times a day (TID) | SUBCUTANEOUS | Status: DC
  Administered 2013-12-08 – 2013-12-09 (×4): 3 [IU] via SUBCUTANEOUS

## 2013-12-08 NOTE — Progress Notes (Signed)
D/C's pt foley catheter. Catheter intact. Pt w/o c/o of pain or distress. Will monitor for urine output.

## 2013-12-08 NOTE — Progress Notes (Signed)
[facility]  [name] MarvelGertrude S [name] WUJ:[id number] DOB: 02/01/1932 DOA: 12/04/2013 PCP: [name],[name], MD  Time spent : 35 minutes  Brief narrative: 80 year old female patient with multiple medical problems. Chronically on 5 L nasal cannula oxygen at home for her COPD. She also has a history of chronic kidney disease as well as prior CHF and history of permanent pacemaker. On the date of admission her children had been attempting to contact her by phone for several hours but when they were unable to reach her they sent someone to check on the patient. Apparently on the morning of admission she was fine. The person that was sent to check on the patient found her at home on room air and having difficulty breathing noting patient is supposed to be on chronic oxygen. EMS was called to the home and upon their assessment her oxygen saturations were in the 70s. She was immediately placed on BiPAP and brought to the emergency department. Family members confirm patient has been having upper respiratory symptoms consisting of progressive coughing and progressive difficulty breathing. After the above treatment measures were initiated patient became more responsive and alert and reported her shortness of breath had improved. She was also noted to be febrile in the emergency department. Her white count was elevated at 16,200 and a pro BNP was greater than 17,000 without a baseline for comparison.  HPI/Subjective: States she is anxious to get home.  In mild resp distress however w/ tahcypnea and O2 sats dipping into mid 80s despite O2 via [employer] at 6L.  Some wheezing audible, which pt is not aware of.  Denies cp or distress.  Assessment/Plan:  Acute on chronic respiratory failure with hypoxia:    -) PNA (pneumonia) / right upper and lower lobe infiltrates     -) COPD (chronic obstructive pulmonary disease) - severe at baseline     -) No evidence of "CHF" -Had similar  presentation during hospitalization in 2012 - required intubation at that time -has weaned from BiPAP to baseline of 5L oxygen -tapering steroids  -Last echocardiogram 2012 revealed mild RV dilatation with preserved LV function and no evidence of diastolic dysfunction - repeat echo with preserved LV fnx but inadequate to eval for DD- dry with CVP 3- no RV/RA dilatation but paradoxical septal wall motion (see below) -Patient on chronic prednisone as well as Singulair -chest x-ray consistent with pneumonia  -adjust nebs given increased wheezing today - increase steroid again   E.Coli UTI -cont empiric anbxs - pan sensitive - d/c foley cath   Thrombocytopenia -has resolved and was likely due to GN infection  Coag negative positive blood cx's -1/2 cx's positive c/w contaminant  Volume depletion -BUN/Scr worsened with attempts to diurese -CVP on ECHO 3 -dc'd Lasix and began gentle IVF hydration 4/16 - slow fluids today   DM  -CBGs poorly controlled due to IV steroids -Not on diabetic meds prior to admission -adjust tx and follow  -HgbA1c 7.1  ? NSTEMI (non-ST elevated myocardial infarction) -Point-of-care troponin mildly elevated at 0.37 but subsequent troponins have been normal  PACEMAKER - [place] -Followed by Dr. Sharrell KuGreg Taylor as an outpatient -Recent evaluation January 2015 > left ventricular lead turned off due to malfunction/inability to program -Dr. Ladona Ridgelaylor felt patient's multifactorial dyspnea might be related to desynchronous RV pacing so plan was to review angios and consider potentially proceeding with LV lead revision  DVT prophylaxis: Lovenox Code Status: Full Family Communication: spoke w/ pt  and daughter at bedside  Disposition Plan/Expected LOS: Stepdown - mobilize - PT/OT  Consultants: None  Procedures: 2-D echocardiogram  - Left ventricle: The cavity size was normal. Wall thickness was normal. Systolic function was normal. The estimated ejection fraction was  in the range of 55% to 60%. Wall motion was normal; there were no regional wall motion abnormalities. The study is not technically sufficient to allow evaluation of LV diastolic function. - Ventricular septum: Septal motion showed paradox.  Antibiotics: Zithromax 4/14 >>> Rocephin 4/14 >>> Vanco 4/15 >>> 4/16  Objective: Blood pressure 149/67, pulse 68, temperature 97.6 F (36.4 C), temperature source Oral, resp. rate 24, height 5\' 3"  (1.6 m), weight 86.9 kg (191 lb 9.3 oz), SpO2 92.00%.  Intake/Output Summary (Last 24 hours) at 12/08/13 1459 Last data filed at 12/08/13 1100  Gross per 24 hour  Intake    690 ml  Output   1050 ml  Net   -360 ml   Exam: General: mild resp distress w/ exp wheeze  Lungs: no focal crackles - diffuse exp wheeze - not tachypneic  Cardiovascular: Regular rate and rhythm without murmur gallop or rub  Abdomen: Nontender, nondistended, soft, bowel sounds positive, no rebound, no ascites, no appreciable mass Musculoskeletal: No significant cyanosis, clubbing of bilateral lower extremities - no signif LE edema  Neurological: Alert and oriented x 3, moves all extremities x 4 without focal neurological deficits, CN 2-12 intact   Scheduled Meds:  Scheduled Meds: . aspirin EC  81 mg Oral Daily  . azithromycin  250 mg Oral QHS  . cefTRIAXone (ROCEPHIN)  IV  1 g Intravenous Q24H  . enoxaparin (LOVENOX) injection  30 mg Subcutaneous Q24H  . insulin aspart  0-9 Units Subcutaneous TID WC  . ipratropium-albuterol  3 mL Nebulization TID  . predniSONE  10 mg Oral Q breakfast   Data Reviewed: Basic Metabolic Panel:  Recent Labs Lab 12/04/13 2258 12/05/13 0441 12/06/13 0234 12/07/13 0905 12/08/13 0420  NA 143 138 139 142 143  K 4.0 4.1 3.7 3.7 4.6  CL 99 94* 94* 99 104  CO2 25 19 26 26 26   GLUCOSE 200* 401* 332* 298* 308*  BUN 26* 30* 45* 53* 50*  CREATININE 1.57* 1.76* 1.97* 1.61* 1.50*  CALCIUM 9.1 9.1 9.2 9.2 9.2   Liver Function Tests:  Recent  Labs Lab 12/04/13 2258  AST 25  ALT 6  ALKPHOS 65  BILITOT 0.7  PROT 6.8  ALBUMIN 3.4*   CBC:  Recent Labs Lab 12/04/13 2258 12/05/13 0441 12/06/13 0234 12/07/13 0905  WBC 16.2* 14.8* 15.2* 12.8*  NEUTROABS 14.0*  --   --   --   HGB 12.4 12.1 10.7* 11.3*  HCT 39.3 38.9 34.6* 36.6  MCV 98.5 100.0 97.7 98.4  PLT 112* 109* 111* 154   Cardiac Enzymes:  Recent Labs Lab 12/04/13 2258 12/05/13 0441 12/05/13 0810 12/05/13 1502  TROPONINI 0.37* <0.30 <0.30 <0.30   BNP (last 3 results)  Recent Labs  12/04/13 2258  PROBNP 17479.0*   CBG:  Recent Labs Lab 12/07/13 1141 12/07/13 1728 12/07/13 2124 12/08/13 0728 12/08/13 1133  GLUCAP 389* 215* 258* 260* 240*    Recent Results (from the past 240 hour(s))  URINE CULTURE     Status: None   Collection Time    12/04/13 10:48 PM      Result Value Ref Range Status   Specimen Description URINE, CATHETERIZED   Final   Special Requests NONE   Final   Culture  Setup Time     Final   Value: 12/05/2013 00:04     Performed at [facility] Count     Final   Value: >=100,000 COLONIES/ML     Performed at [facility]   Culture     Final   Value: ESCHERICHIA COLI     Performed at [facility]   Report Status 12/07/2013 FINAL   Final   Organism ID, Bacteria ESCHERICHIA COLI   Final  CULTURE, BLOOD (ROUTINE X 2)     Status: None   Collection Time    12/04/13 10:58 PM      Result Value Ref Range Status   Specimen Description BLOOD BLOOD RIGHT FOREARM   Final   Special Requests     Final   Value: BOTTLES DRAWN AEROBIC AND ANAEROBIC 7CC AEROBIC 3CC ANAEROBIC   Culture  Setup Time     Final   Value: 12/05/2013 04:57     Performed at [facility]   Culture     Final   Value:        BLOOD CULTURE RECEIVED NO GROWTH TO DATE CULTURE WILL BE HELD FOR 5 DAYS BEFORE ISSUING A FINAL NEGATIVE REPORT     Performed at [facility]   Report Status PENDING   Incomplete  CULTURE,  BLOOD (ROUTINE X 2)     Status: None   Collection Time    12/04/13 10:58 PM      Result Value Ref Range Status   Specimen Description BLOOD BLOOD LEFT FOREARM   Final   Special Requests BOTTLES DRAWN AEROBIC AND ANAEROBIC 10CC EACH   Final   Culture  Setup Time     Final   Value: 12/05/2013 04:57     Performed at [facility]   Culture     Final   Value: STAPHYLOCOCCUS SPECIES (COAGULASE NEGATIVE)     Note: THE SIGNIFICANCE OF ISOLATING THIS ORGANISM FROM A SINGLE SET OF BLOOD CULTURES WHEN MULTIPLE SETS ARE DRAWN IS UNCERTAIN. PLEASE NOTIFY THE MICROBIOLOGY DEPARTMENT WITHIN ONE WEEK IF SPECIATION AND SENSITIVITIES ARE REQUIRED.     Note: Gram Stain Report Called to,Read Back By and Verified With: LEE WORLEY 12/06/13 1242A FULKC     Performed at [facility]   Report Status 12/07/2013 FINAL   Final  MRSA PCR SCREENING     Status: None   Collection Time    12/05/13  6:30 AM      Result Value Ref Range Status   MRSA by PCR NEGATIVE  NEGATIVE Final   Comment:            The GeneXpert MRSA Assay (FDA     approved for NASAL specimens     only), is one component of a     comprehensive MRSA colonization     surveillance program. It is not     intended to diagnose MRSA     infection nor to guide or     monitor treatment for     MRSA infections.     Studies:  Recent x-ray studies have been reviewed in detail by the Attending Physician  Lonia Blood, MD [facility] For Consults/Admissions - Flow Manager - [phone] Office  [phone] Pager [phone]  On-Call/Text Page:      [email or web address]      password [email or web address]  If 7PM-7AM, please contact night-coverage [email or web address] Password [email or web address] 12/08/2013, 2:59 PM   LOS: 4 days

## 2013-12-09 ENCOUNTER — Inpatient Hospital Stay (HOSPITAL_COMMUNITY): Payer: Medicare Other

## 2013-12-09 LAB — GLUCOSE, CAPILLARY
GLUCOSE-CAPILLARY: 244 mg/dL — AB (ref 70–99)
GLUCOSE-CAPILLARY: 234 mg/dL — AB (ref 70–99)
Glucose-Capillary: 237 mg/dL — ABNORMAL HIGH (ref 70–99)
Glucose-Capillary: 266 mg/dL — ABNORMAL HIGH (ref 70–99)

## 2013-12-09 MED ORDER — INSULIN ASPART 100 UNIT/ML ~~LOC~~ SOLN
6.0000 [IU] | Freq: Three times a day (TID) | SUBCUTANEOUS | Status: DC
  Administered 2013-12-10 (×2): 6 [IU] via SUBCUTANEOUS

## 2013-12-09 MED ORDER — INSULIN GLARGINE 100 UNIT/ML ~~LOC~~ SOLN
14.0000 [IU] | Freq: Every day | SUBCUTANEOUS | Status: DC
  Administered 2013-12-09: 14 [IU] via SUBCUTANEOUS
  Filled 2013-12-09 (×2): qty 0.14

## 2013-12-09 MED ORDER — PREDNISONE 20 MG PO TABS
20.0000 mg | ORAL_TABLET | Freq: Two times a day (BID) | ORAL | Status: DC
  Administered 2013-12-10: 20 mg via ORAL
  Filled 2013-12-09 (×3): qty 1

## 2013-12-09 NOTE — Progress Notes (Signed)
[facility]  [name] NWG:[id number] DOB: [birth year] DOA: 12/04/2013 PCP: [name],[name], MD  Time spent : 25 minutes  Brief narrative: 80 year old female patient with multiple medical problems. Chronically on 5 L nasal cannula oxygen at home for her COPD. She also has a history of chronic kidney disease as well as prior CHF and history of permanent pacemaker. On the date of admission her children had been attempting to contact her by phone for several hours but when they were unable to reach her they sent someone to check on the patient. Apparently on the morning of admission she was fine. The person that was sent to check on the patient found her at home on room air and having difficulty breathing noting patient is supposed to be on chronic oxygen. EMS was called to the home and upon their assessment her oxygen saturations were in the 70s. She was immediately placed on BiPAP and brought to the emergency department. Family members confirm patient has been having upper respiratory symptoms consisting of progressive coughing and progressive difficulty breathing. After the above treatment measures were initiated patient became more responsive and alert and reported her shortness of breath had improved. She was also noted to be febrile in the emergency department. Her white count was elevated at 16,200 and a pro BNP was greater than 17,000 without a baseline for comparison.  HPI/Subjective: Less distress today w/ less wheezing.  No new complaints.    Assessment/Plan:  Acute on chronic respiratory failure with hypoxia:    -) PNA (pneumonia) / right upper and lower lobe infiltrates     -) COPD (chronic obstructive pulmonary disease) - severe at baseline     -) No evidence of "CHF" -Had similar presentation during hospitalization in 2012 - required intubation at that time -has weaned from BiPAP to baseline of 5L oxygen -Last echocardiogram 2012  revealed mild RV dilatation with preserved LV function and no evidence of diastolic dysfunction - repeat echo with preserved LV fnx but inadequate to eval for DD- dry with CVP 3- no RV/RA dilatation but paradoxical septal wall motion (see below) -Patient on chronic prednisone as well as Singulair -chest x-ray consistent with pneumonia  -COPD exac appears to be slowly improving w/ return to high dose steroid and nebs   E.Coli UTI -cont empiric anbxs - pan sensitive - d/c foley cath   Thrombocytopenia -has resolved and was likely due to GN infection  Coag negative positive blood cx's -1/2 cx's positive c/w contaminant  Volume depletion -BUN/Scr worsened with attempts to diurese -CVP on ECHO 3 -dc'd Lasix and began gentle IVF hydration 4/16 - NSL fluids today   DM  -CBGs poorly controlled due to IV steroids -Not on diabetic meds prior to admission -adjust tx again and follow  -HgbA1c 7.1  ? NSTEMI (non-ST elevated myocardial infarction) -Point-of-care troponin mildly elevated at 0.37 but subsequent troponins have been normal  PACEMAKER - [place] -Followed by Dr. Sharrell KuGreg Taylor as an outpatient -Recent evaluation January 2015 > left ventricular lead turned off due to malfunction/inability to program -Dr. Ladona Ridgelaylor felt patient's multifactorial dyspnea might be related to desynchronous RV pacing so plan was to review angios and consider potentially proceeding with LV lead revision  DVT prophylaxis: Lovenox Code Status: Full Family Communication: spoke w/ pt at bedside  Disposition Plan/Expected LOS: Stepdown - mobilize - PT/OT  Consultants: None  Procedures: 2-D echocardiogram  - Left ventricle: The cavity size was normal. Wall  thickness was normal. Systolic function was normal. The estimated ejection fraction was in the range of 55% to 60%. Wall motion was normal; there were no regional wall motion abnormalities. The study is not technically sufficient to allow evaluation of LV  diastolic function. - Ventricular septum: Septal motion showed paradox.  Antibiotics: Zithromax 4/14 >>> Rocephin 4/14 >>> Vanco 4/15 >>> 4/16  Objective: Blood pressure 156/90, pulse 51, temperature 98 F (36.7 C), temperature source Oral, resp. rate 27, height 5\' 3"  (1.6 m), weight 86.8 kg (191 lb 5.8 oz), SpO2 93.00%.  Intake/Output Summary (Last 24 hours) at 12/09/13 1555 Last data filed at 12/09/13 0600  Gross per 24 hour  Intake   1110 ml  Output    575 ml  Net    535 ml   Exam: General: no acute resp distress Lungs: no focal crackles - diffuse exp wheeze but improved - not tachypneic  Cardiovascular: Regular rate and rhythm without murmur gallop or rub  Abdomen: Nontender, nondistended, soft, bowel sounds positive, no rebound, no ascites, no appreciable mass Musculoskeletal: No significant cyanosis, clubbing of bilateral lower extremities - no signif LE edema    Scheduled Meds:  Scheduled Meds: . aspirin EC  81 mg Oral Daily  . azithromycin  250 mg Oral QHS  . cefTRIAXone (ROCEPHIN)  IV  1 g Intravenous Q24H  . enoxaparin (LOVENOX) injection  30 mg Subcutaneous Q24H  . insulin aspart  0-9 Units Subcutaneous TID WC  . insulin aspart  3 Units Subcutaneous TID WC  . insulin glargine  10 Units Subcutaneous QHS  . ipratropium-albuterol  3 mL Nebulization QID  . methylPREDNISolone (SOLU-MEDROL) injection  40 mg Intravenous Q12H  . pantoprazole  40 mg Oral BID   Data Reviewed: Basic Metabolic Panel:  Recent Labs Lab 12/04/13 2258 12/05/13 0441 12/06/13 0234 12/07/13 0905 12/08/13 0420  NA 143 138 139 142 143  K 4.0 4.1 3.7 3.7 4.6  CL 99 94* 94* 99 104  CO2 25 19 26 26 26   GLUCOSE 200* 401* 332* 298* 308*  BUN 26* 30* 45* 53* 50*  CREATININE 1.57* 1.76* 1.97* 1.61* 1.50*  CALCIUM 9.1 9.1 9.2 9.2 9.2   Liver Function Tests:  Recent Labs Lab 12/04/13 2258  AST 25  ALT 6  ALKPHOS 65  BILITOT 0.7  PROT 6.8  ALBUMIN 3.4*   CBC:  Recent Labs Lab  12/04/13 2258 12/05/13 0441 12/06/13 0234 12/07/13 0905  WBC 16.2* 14.8* 15.2* 12.8*  NEUTROABS 14.0*  --   --   --   HGB 12.4 12.1 10.7* 11.3*  HCT 39.3 38.9 34.6* 36.6  MCV 98.5 100.0 97.7 98.4  PLT 112* 109* 111* 154   Cardiac Enzymes:  Recent Labs Lab 12/04/13 2258 12/05/13 0441 12/05/13 0810 12/05/13 1502  TROPONINI 0.37* <0.30 <0.30 <0.30   BNP (last 3 results)  Recent Labs  12/04/13 2258  PROBNP 17479.0*   CBG:  Recent Labs Lab 12/08/13 1608 12/08/13 1642 12/08/13 2118 12/09/13 0726 12/09/13 1234  GLUCAP 288* 314* 212* 244* 234*    Recent Results (from the past 240 hour(s))  URINE CULTURE     Status: None   Collection Time    12/04/13 10:48 PM      Result Value Ref Range Status   Specimen Description URINE, CATHETERIZED   Final   Special Requests NONE   Final   Culture  Setup Time     Final   Value: 12/05/2013 00:04     Performed at [facility]   Colony Count     Final   Value: >=100,000 COLONIES/ML     Performed at [facility]     Final   Value: ESCHERICHIA COLI     Performed at [facility]   Report Status 12/07/2013 FINAL   Final   Organism ID, Bacteria ESCHERICHIA COLI   Final  CULTURE, BLOOD (ROUTINE X 2)     Status: None   Collection Time    12/04/13 10:58 PM      Result Value Ref Range Status   Specimen Description BLOOD BLOOD RIGHT FOREARM   Final   Special Requests     Final   Value: BOTTLES DRAWN AEROBIC AND ANAEROBIC 7CC AEROBIC 3CC ANAEROBIC   Culture  Setup Time     Final   Value: 12/05/2013 04:57     Performed at [facility]   Culture     Final   Value:        BLOOD CULTURE RECEIVED NO GROWTH TO DATE CULTURE WILL BE HELD FOR 5 DAYS BEFORE ISSUING A FINAL NEGATIVE REPORT     Performed at [facility]   Report Status PENDING   Incomplete  CULTURE, BLOOD (ROUTINE X 2)     Status: None   Collection Time    12/04/13 10:58 PM      Result Value Ref Range Status   Specimen  Description BLOOD BLOOD LEFT FOREARM   Final   Special Requests BOTTLES DRAWN AEROBIC AND ANAEROBIC 10CC EACH   Final   Culture  Setup Time     Final   Value: 12/05/2013 04:57     Performed at [facility]   Culture     Final   Value: STAPHYLOCOCCUS SPECIES (COAGULASE NEGATIVE)     Note: THE SIGNIFICANCE OF ISOLATING THIS ORGANISM FROM A SINGLE SET OF BLOOD CULTURES WHEN MULTIPLE SETS ARE DRAWN IS UNCERTAIN. PLEASE NOTIFY THE MICROBIOLOGY DEPARTMENT WITHIN ONE WEEK IF SPECIATION AND SENSITIVITIES ARE REQUIRED.     Note: Gram Stain Report Called to,Read Back By and Verified With: LEE WORLEY 12/06/13 1242A FULKC     Performed at [facility]   Report Status 12/07/2013 FINAL   Final  MRSA PCR SCREENING     Status: None   Collection Time    12/05/13  6:30 AM      Result Value Ref Range Status   MRSA by PCR NEGATIVE  NEGATIVE Final   Comment:            The GeneXpert MRSA Assay (FDA     approved for NASAL specimens     only), is one component of a     comprehensive MRSA colonization     surveillance program. It is not     intended to diagnose MRSA     infection nor to guide or     monitor treatment for     MRSA infections.     Studies:  Recent x-ray studies have been reviewed in detail by the Attending Physician  Lonia Blood, MD [facility] For Consults/Admissions - Flow Manager - [phone] Office  [phone] Pager [phone]  On-Call/Text Page:      [email or web address]      password [email or web address]  If 7PM-7AM, please contact night-coverage [email or web address] Password [email or web address] 12/09/2013, 3:55 PM   LOS: 5 days

## 2013-12-10 ENCOUNTER — Ambulatory Visit: Payer: Self-pay | Admitting: Emergency Medicine

## 2013-12-10 DIAGNOSIS — I5033 Acute on chronic diastolic (congestive) heart failure: Secondary | ICD-10-CM

## 2013-12-10 DIAGNOSIS — J9620 Acute and chronic respiratory failure, unspecified whether with hypoxia or hypercapnia: Secondary | ICD-10-CM

## 2013-12-10 DIAGNOSIS — I4891 Unspecified atrial fibrillation: Secondary | ICD-10-CM

## 2013-12-10 LAB — BASIC METABOLIC PANEL
BUN: 42 mg/dL — AB (ref 6–23)
CHLORIDE: 105 meq/L (ref 96–112)
CO2: 23 mEq/L (ref 19–32)
Calcium: 9.1 mg/dL (ref 8.4–10.5)
Creatinine, Ser: 1.43 mg/dL — ABNORMAL HIGH (ref 0.50–1.10)
GFR calc Af Amer: 39 mL/min — ABNORMAL LOW (ref >90)
GFR calc non Af Amer: 34 mL/min — ABNORMAL LOW (ref >90)
Glucose, Bld: 285 mg/dL — ABNORMAL HIGH (ref 70–99)
POTASSIUM: 4.7 meq/L (ref 3.7–5.3)
Sodium: 141 mEq/L (ref 137–147)

## 2013-12-10 LAB — GLUCOSE, CAPILLARY
GLUCOSE-CAPILLARY: 203 mg/dL — AB (ref 70–99)
Glucose-Capillary: 212 mg/dL — ABNORMAL HIGH (ref 70–99)
Glucose-Capillary: 166 mg/dL — ABNORMAL HIGH (ref 70–99)
Glucose-Capillary: 172 mg/dL — ABNORMAL HIGH (ref 70–99)
Glucose-Capillary: 191 mg/dL — ABNORMAL HIGH (ref 70–99)

## 2013-12-10 LAB — CBC
HEMATOCRIT: 34.1 % — AB (ref 36.0–46.0)
Hemoglobin: 10.7 g/dL — ABNORMAL LOW (ref 12.0–15.0)
MCH: 30.8 pg (ref 26.0–34.0)
MCHC: 31.4 g/dL (ref 30.0–36.0)
MCV: 98.3 fL (ref 78.0–100.0)
Platelets: 174 10*3/uL (ref 150–400)
RBC: 3.47 MIL/uL — AB (ref 3.87–5.11)
RDW: 13.4 % (ref 11.5–15.5)
WBC: 16.3 10*3/uL — ABNORMAL HIGH (ref 4.0–10.5)

## 2013-12-10 MED ORDER — INSULIN ASPART 100 UNIT/ML ~~LOC~~ SOLN
8.0000 [IU] | Freq: Three times a day (TID) | SUBCUTANEOUS | Status: DC
  Administered 2013-12-10 – 2013-12-13 (×5): 8 [IU] via SUBCUTANEOUS

## 2013-12-10 MED ORDER — INSULIN GLARGINE 100 UNIT/ML ~~LOC~~ SOLN
16.0000 [IU] | Freq: Every day | SUBCUTANEOUS | Status: DC
  Administered 2013-12-11 – 2013-12-12 (×3): 16 [IU] via SUBCUTANEOUS
  Filled 2013-12-10 (×6): qty 0.16

## 2013-12-10 MED ORDER — MONTELUKAST SODIUM 10 MG PO TABS
10.0000 mg | ORAL_TABLET | Freq: Every day | ORAL | Status: DC
  Administered 2013-12-10 – 2013-12-13 (×4): 10 mg via ORAL
  Filled 2013-12-10 (×4): qty 1

## 2013-12-10 MED ORDER — ENOXAPARIN SODIUM 40 MG/0.4ML ~~LOC~~ SOLN
40.0000 mg | SUBCUTANEOUS | Status: DC
  Administered 2013-12-10 – 2013-12-12 (×3): 40 mg via SUBCUTANEOUS
  Filled 2013-12-10 (×4): qty 0.4

## 2013-12-10 MED ORDER — TIOTROPIUM BROMIDE MONOHYDRATE 18 MCG IN CAPS
18.0000 ug | ORAL_CAPSULE | Freq: Every day | RESPIRATORY_TRACT | Status: DC
  Administered 2013-12-11 – 2013-12-13 (×3): 18 ug via RESPIRATORY_TRACT
  Filled 2013-12-10 (×2): qty 5

## 2013-12-10 MED ORDER — ALBUTEROL SULFATE (2.5 MG/3ML) 0.083% IN NEBU
2.5000 mg | INHALATION_SOLUTION | Freq: Four times a day (QID) | RESPIRATORY_TRACT | Status: DC
  Administered 2013-12-10 – 2013-12-13 (×11): 2.5 mg via RESPIRATORY_TRACT
  Filled 2013-12-10 (×12): qty 3

## 2013-12-10 MED ORDER — PREDNISONE 10 MG PO TABS
10.0000 mg | ORAL_TABLET | Freq: Two times a day (BID) | ORAL | Status: DC
  Administered 2013-12-10 – 2013-12-13 (×6): 10 mg via ORAL
  Filled 2013-12-10 (×8): qty 1

## 2013-12-10 NOTE — Progress Notes (Signed)
Pt to room 3E22 from 3S10.  Arrived in wheelchair with 0@ at 5L per nasal cannula..  Skin warm and dry.  No voiced complaints.

## 2013-12-10 NOTE — Progress Notes (Signed)
Physical Therapy Treatment Patient Details Name: [name] MarvelGertrude S Scheffler MRN: [id number] DOB: August 07, 1932 Today's Date: 12/10/2013    History of Present Illness This 80 yo female with h/o COPD who is on 5L 02 at home was admitted with respiratory distress and initially required BiPaP.  Dx:  PNA; UTI; ? sepsis; ? NSTEMI as her troponins were initially elevated.     PT Comments    Pt is progressing well towards functional mobility goals. Pt began ambulation on this date however, was limited to fatigue and SOB. Pt demonstrated unsteadiness during amb (req mod A to correct LOB) and required frequent standing resting breaks. Pt to benefit from skilled acute PT to address limitations listed below.   Follow Up Recommendations  Home health PT;Supervision for mobility/OOB     Equipment Recommendations  None recommended by PT    Recommendations for Other Services OT consult     Precautions / Restrictions Precautions Precautions: Fall Restrictions Weight Bearing Restrictions: No    Mobility  Bed Mobility               General bed mobility comments: not assessed. pt in chair upon arrival  Transfers Overall transfer level: Needs assistance Equipment used: Rolling walker (2 wheeled) Transfers: Sit to/from Stand Sit to Stand: Min assist         General transfer comment: pt req min A with verbal cues for hand placement and sequencing to RW support. pt req RW for standing support  Ambulation/Gait Ambulation/Gait assistance: Mod assist Ambulation Distance (Feet): 45 Feet Assistive device: Rolling walker (2 wheeled) Gait Pattern/deviations: Step-to pattern;Decreased stride length;Staggering left;Staggering right Gait velocity: slow; guarded for falls and fatigue   General Gait Details: pt req mod A after experiencing LOB with first couple of steps. pt req RW for support and stability. Pt took three standing rest breaks lasting 30 seconds each. pt req tactile and verbal cues for upright  posture. pt tends to lean forward on resting forearms on RW during rest breaks. pt educated to stand upright to allow facilitation and lung expansion. pt limited to fatigue and SOB. O2 sats dropped to 80. RN aware. 2L of O2 used during amb.   Stairs            Wheelchair Mobility    Modified Rankin (Stroke Patients Only)       Balance Overall balance assessment: Needs assistance Sitting-balance support: Feet supported;No upper extremity supported Sitting balance-Leahy Scale: Good     Standing balance support: Bilateral upper extremity supported Standing balance-Leahy Scale: Fair Standing balance comment: pt req RW to complete upright standing position. Pt relied on RW during standing resting breaks. pt did not sway or experience LOB in static standing.                     Cognition Arousal/Alertness: Awake/alert Behavior During Therapy: Flat affect Overall Cognitive Status: Within Functional Limits for tasks assessed Area of Impairment: Safety/judgement         Safety/Judgement: Decreased awareness of safety;Decreased awareness of deficits     General Comments: pt unaware of safety and limitations with OOB activities.     Exercises General Exercises - Upper Extremity Shoulder Flexion: AROM;Both;10 reps;Seated General Exercises - Lower Extremity Ankle Circles/Pumps: AROM;Both;10 reps    General Comments General comments (skin integrity, edema, etc.): pt education on continuing use of inspirometer and exercises to stay active throughout the day.      Pertinent Vitals/Pain Pt's O2 sats dropped to 80 during amb while  on 5L of O2. RN aware.     Home Living                      Prior Function            PT Goals (current goals can now be found in the care plan section) Acute Rehab PT Goals Patient Stated Goal: to go home PT Goal Formulation: With patient Time For Goal Achievement: 12/14/13 Potential to Achieve Goals: Good Progress towards  PT goals: Progressing toward goals    Frequency  Min 3X/week    PT Plan Current plan remains appropriate    Co-evaluation             End of Session Equipment Utilized During Treatment: Gait belt;Oxygen (5L O2) Activity Tolerance: Patient limited by fatigue Patient left: in chair;with call bell/phone within reach     Time: 1047-1108 PT Time Calculation (min): 21 min  Charges:                       G Codes:      Malachi Paradisendrew Dora Simeone SPT 12/10/2013, 1:16 PM

## 2013-12-10 NOTE — Progress Notes (Signed)
Occupational Therapy Treatment Patient Details Name: [name] MRN: [id number] DOB: 02-01-1932 Today's Date: 12/10/2013    History of present illness This [age] yo female with h/o COPD who is on 5L 02 at home was admitted with respiratory distress and initially required BiPaP.  Dx:  PNA; UTI; ? sepsis; ? NSTEMI as her troponins were initially elevated.    OT comments  This 80 yo female not making progress due to severe DOE and sats dropping to 70's on 6 liters of O2 with minimal activity ([facility] Feel pt would benefit from SNF stay prior to going home with questionable 24 hour A.  Follow Up Recommendations  SNF    Equipment Recommendations  Tub/shower seat       Precautions / Restrictions Precautions Precautions: Fall Precaution Comments: On 5 liters of O2 at home, desats into the 70's on 6 liters of O2 with activity with increased, increased time and cues for purse lipped breathing Restrictions Weight Bearing Restrictions: No       Mobility Bed Mobility               General bed mobility comments: Pt up in recliner upon arrival  Transfers Overall transfer level: Needs assistance Equipment used:  (W/C  as walker) Transfers: Sit to/from Stand Sit to Stand: Min guard         General transfer comment: Vcs for safe hand placement    Balance Overall balance assessment: Needs assistance         Standing balance support: Bilateral upper extremity supported;During functional activity Standing balance-Leahy Scale: Fair Standing balance comment: However with decreased endurance                   ADL                           Toilet Transfer: Minimal assistance (BSC about 10 feet away)   Toileting- Clothing Manipulation and Hygiene: Total assistance;Sit to/from stand         General ADL Comments: Pt with 4/4 DOE just [facility], talked to pt about how she feels about being able to care for herself at home and  she said "I could do better than here". When asked what she meant by that she said," with someone with me I could manage at home"                Cognition   Behavior During Therapy: Flat affect Overall Cognitive Status: Impaired/Different from baseline Area of Impairment: Safety/judgement          Safety/Judgement: Decreased awareness of safety;Decreased awareness of deficits     General Comments: pt unaware of safety and limitations with OOB activities.                  Pertinent Vitals/ Pain       Pt dropped into the 70's on 6 liters of O2 with ambulation 10 feet to BSC and needed increased, increased time to get back up to the            90's.         Frequency Min 2X/week     Progress Toward Goals  OT Goals(current goals can now be found in the care plan section)  Progress towards OT goals: Not progressing toward goals - comment (due to DOE even with minimal activity)     Plan Discharge plan needs to be  updated       End of Session Equipment Utilized During Treatment: Rolling walker;Oxygen   Activity Tolerance Patient limited by fatigue;Other (comment)   Patient Left in chair;with call bell/phone within reach   Nurse Communication  (Pt not safe to be on blood thinner and I do not feel she family will be able to manage her at this level at home.)        Time: [id number] OT Time Calculation (min): 29 min  Charges: OT General Charges $OT Visit: 1 Procedure OT Treatments $Self Care/Home Management : 23-37 mins  [name] [phone] 12/10/2013, 3:51 PM

## 2013-12-10 NOTE — Progress Notes (Signed)
Physical Therapy Treatment Patient Details Name: [name] MarvelGertrude S [name] MRN: [id number] DOB: 1932/01/27 Today's Date: 12/10/2013    History of Present Illness This 80 yo female with h/o COPD who is on 5L 02 at home was admitted with respiratory distress and initially required BiPaP.  Dx:  PNA; UTI; ? sepsis; ? NSTEMI as her troponins were initially elevated.     PT Comments    Pt is progressing well towards functional mobility goals. Pt began ambulation on this date however, was limited to fatigue and SOB. Pt demonstrated unsteadiness during amb (req mod A to correct LOB) and required frequent standing resting breaks. Pt to benefit from skilled acute PT to address limitations listed below.   Follow Up Recommendations  Home health PT;24/7 supervision,  if 24/7 supervision can not be provided pt to need ST-SNF to achieve safe mod I function     Equipment Recommendations  None recommended by PT    Recommendations for Other Services OT consult     Precautions / Restrictions Precautions Precautions: Fall Restrictions Weight Bearing Restrictions: No    Mobility  Bed Mobility               General bed mobility comments: not assessed. pt in chair upon arrival  Transfers Overall transfer level: Needs assistance Equipment used: Rolling walker (2 wheeled) Transfers: Sit to/from Stand Sit to Stand: Min assist         General transfer comment: pt req min A with verbal cues for hand placement and sequencing to RW support. pt req RW for standing support  Ambulation/Gait Ambulation/Gait assistance: Mod assist Ambulation Distance (Feet): 45 Feet Assistive device: Rolling walker (2 wheeled) Gait Pattern/deviations: Step-to pattern;Decreased stride length;Staggering left;Staggering right Gait velocity: slow; guarded for falls and fatigue   General Gait Details: pt req mod A after experiencing LOB with first couple of steps. pt req RW for support and stability. Pt took three standing  rest breaks lasting 30 seconds each. pt req tactile and verbal cues for upright posture. pt tends to lean forward on resting forearms on RW during rest breaks. pt educated to stand upright to allow facilitation and lung expansion. pt limited to fatigue and SOB. O2 sats dropped to 80. RN aware. 2L of O2 used during amb.   Stairs            Wheelchair Mobility    Modified Rankin (Stroke Patients Only)       Balance Overall balance assessment: Needs assistance Sitting-balance support: Feet supported;No upper extremity supported Sitting balance-Leahy Scale: Good     Standing balance support: Bilateral upper extremity supported Standing balance-Leahy Scale: Fair Standing balance comment: pt req RW to complete upright standing position. Pt relied on RW during standing resting breaks. pt did not sway or experience LOB in static standing.                     Cognition Arousal/Alertness: Awake/alert Behavior During Therapy: Flat affect Overall Cognitive Status: Within Functional Limits for tasks assessed Area of Impairment: Safety/judgement         Safety/Judgement: Decreased awareness of safety;Decreased awareness of deficits     General Comments: pt unaware of safety and limitations with OOB activities.     Exercises General Exercises - Upper Extremity Shoulder Flexion: AROM;Both;10 reps;Seated General Exercises - Lower Extremity Ankle Circles/Pumps: AROM;Both;10 reps    General Comments General comments (skin integrity, edema, etc.): pt education on continuing use of inspirometer and exercises to stay active throughout the  day.      Pertinent Vitals/Pain Pt's O2 sats dropped to 80 during amb while on 5L of O2. RN aware.     Home Living                      Prior Function            PT Goals (current goals can now be found in the care plan section) Acute Rehab PT Goals Patient Stated Goal: to go home PT Goal Formulation: With patient Time For  Goal Achievement: 12/14/13 Potential to Achieve Goals: Good Progress towards PT goals: Progressing toward goals    Frequency  Min 3X/week    PT Plan Current plan remains appropriate    Co-evaluation             End of Session Equipment Utilized During Treatment: Gait belt;Oxygen (5L O2) Activity Tolerance: Patient limited by fatigue Patient left: in chair;with call bell/phone within reach     Time: 1047-1108 PT Time Calculation (min): 21 min  Charges:                       G Codes:      Malachi Paradisendrew Kohler SPT 12/10/2013, 1:16 PM  Agree with above assessment.  Lewis ShockAshly Brenan [place], PT, DPT Pager #: [phone] Office #: [phone]

## 2013-12-10 NOTE — Progress Notes (Signed)
Pt transferred to 3E22 per MD order. Report called to receiving nurse and all questions answered.

## 2013-12-10 NOTE — Progress Notes (Signed)
Utilization review completed.  

## 2013-12-10 NOTE — Progress Notes (Signed)
[facility]  [name] ZOX:[id number] DOB: 20-Aug-1932 DOA: 12/04/2013 PCP: Nadean Corwin, MD  Time spent : 35 minutes  Brief narrative: 80 year old female patient with multiple medical problems. Chronically on 5 L nasal cannula oxygen at home for her COPD. She also has a history of chronic kidney disease as well as prior CHF and history of permanent pacemaker. On the date of admission her children had been attempting to contact her by phone for several hours but when they were unable to reach her they sent someone to check on the patient. Apparently on the morning of admission she was fine. The person that was sent to check on the patient found her at home on room air and having difficulty breathing noting patient is supposed to be on chronic oxygen. EMS was called to the home and upon their assessment her oxygen saturations were in the 70s. She was immediately placed on BiPAP and brought to the emergency department. Family members confirm patient has been having upper respiratory symptoms consisting of progressive coughing and progressive difficulty breathing. After the above treatment measures were initiated patient became more responsive and alert and reported her shortness of breath had improved. She was also noted to be febrile in the emergency department. Her white count was elevated at 16,200 and a pro BNP was greater than 17,000 without a baseline for comparison.  HPI/Subjective: Feeling better today.  Sitting up in chair.  Has not yet been able to ambulate to any great extent.  Assessment/Plan:  Acute on chronic respiratory failure with hypoxia:    -) PNA (pneumonia) / right upper and lower lobe infiltrates     -) COPD (chronic obstructive pulmonary disease) - severe at baseline     -) No evidence of "CHF" -Had similar presentation during hospitalization in 2012 - required intubation at that time -has weaned from BiPAP to baseline of  5L oxygen -Last echocardiogram 2012 revealed mild RV dilatation with preserved LV function and no evidence of diastolic dysfunction - repeat echo with preserved LV fnx but inadequate to eval for DD- dry with CVP 3- no RV/RA dilatation but paradoxical septal wall motion (see below) -Resume home medical regimen -chest x-ray consistent with pneumonia  -COPD exac appears to be slowly improving keeping in mind that patient has severe disease even at baseline   E.Coli UTI -cont empiric anbxs - pan sensitive - d/c foley cath   Thrombocytopenia -has resolved and was likely due to GN infection  Recurrent atrial fib with a controlled VR -care as per Cards / EP  Coag negative positive blood cx's -1/2 cx's positive c/w contaminant - should be adequately covered w/ current abx if by change not   DM  -CBGs poorly controlled due to IV steroids -Not on diabetic meds prior to admission -adjust tx again today and follow  -HgbA1c 7.1  ? NSTEMI (non-ST elevated myocardial infarction) -Point-of-care troponin mildly elevated at 0.37 but subsequent troponins have been normal  PACEMAKER - [facility] -Followed by Dr. Sharrell Ku as an outpatient -Recent evaluation January 2015 > left ventricular lead turned off due to malfunction/inability to program -Dr. Ladona Ridgel felt patient's multifactorial dyspnea might be related to desynchronous RV pacing so plan was to review angios and consider potentially proceeding with LV lead revision -EP following pt in house   DVT prophylaxis: Lovenox Code Status: Full Family Communication: spoke w/ pt at bedside  Disposition Plan/Expected LOS: to tele bed - mobilize - PT/OT -  possible home in ~48hrs   Consultants: Cardioloyg > EP   Procedures: 2-D echocardiogram  - Left ventricle: The cavity size was normal. Wall thickness was normal. Systolic function was normal. The estimated ejection fraction was in the range of 55% to 60%. Wall motion was normal; there were no  regional wall motion abnormalities. The study is not technically sufficient to allow evaluation of LV diastolic function. - Ventricular septum: Septal motion showed paradox.  Antibiotics: Zithromax 4/14 >>>4/19 Rocephin 4/14 >>> Vanco 4/15 >>> 4/16  Objective: Blood pressure 154/61, pulse 51, temperature 97.7 F (36.5 C), temperature source Oral, resp. rate 25, height 5\' 3"  (1.6 m), weight 86.6 kg (190 lb 14.7 oz), SpO2 93.00%.  Intake/Output Summary (Last 24 hours) at 12/10/13 1624 Last data filed at 12/10/13 0500  Gross per 24 hour  Intake    120 ml  Output    950 ml  Net   -830 ml   Exam: General: has to pause during speech to breath - states this is her baseline Lungs: no focal crackles - diffuse exp wheeze - not tachypneic  Cardiovascular: Regular rate without murmur gallop or rub  Abdomen: Nontender, nondistended, soft, bowel sounds positive, no rebound, no ascites, no appreciable mass Musculoskeletal: No significant cyanosis, clubbing of bilateral lower extremities - no signif LE edema    Scheduled Meds:  Scheduled Meds: . albuterol  2.5 mg Nebulization QID  . aspirin EC  81 mg Oral Daily  . cefTRIAXone (ROCEPHIN)  IV  1 g Intravenous Q24H  . enoxaparin (LOVENOX) injection  40 mg Subcutaneous Q24H  . insulin aspart  0-9 Units Subcutaneous TID WC  . insulin aspart  6 Units Subcutaneous TID WC  . insulin glargine  14 Units Subcutaneous QHS  . montelukast  10 mg Oral Daily  . pantoprazole  40 mg Oral BID  . predniSONE  20 mg Oral BID WC  . tiotropium  18 mcg Inhalation Daily   Data Reviewed: Basic Metabolic Panel:  Recent Labs Lab 12/05/13 0441 12/06/13 0234 12/07/13 0905 12/08/13 0420 12/10/13 0229  NA 138 139 142 143 141  K 4.1 3.7 3.7 4.6 4.7  CL 94* 94* 99 104 105  CO2 19 26 26 26 23   GLUCOSE 401* 332* 298* 308* 285*  BUN 30* 45* 53* 50* 42*  CREATININE 1.76* 1.97* 1.61* 1.50* 1.43*  CALCIUM 9.1 9.2 9.2 9.2 9.1   Liver Function Tests:  Recent  Labs Lab 12/04/13 2258  AST 25  ALT 6  ALKPHOS 65  BILITOT 0.7  PROT 6.8  ALBUMIN 3.4*   CBC:  Recent Labs Lab 12/04/13 2258 12/05/13 0441 12/06/13 0234 12/07/13 0905 12/10/13 0229  WBC 16.2* 14.8* 15.2* 12.8* 16.3*  NEUTROABS 14.0*  --   --   --   --   HGB 12.4 12.1 10.7* 11.3* 10.7*  HCT 39.3 38.9 34.6* 36.6 34.1*  MCV 98.5 100.0 97.7 98.4 98.3  PLT 112* 109* 111* 154 174   Cardiac Enzymes:  Recent Labs Lab 12/04/13 2258 12/05/13 0441 12/05/13 0810 12/05/13 1502  TROPONINI 0.37* <0.30 <0.30 <0.30   BNP (last 3 results)  Recent Labs  12/04/13 2258  PROBNP 17479.0*   CBG:  Recent Labs Lab 12/09/13 1234 12/09/13 1645 12/09/13 2156 12/10/13 0732 12/10/13 1152  GLUCAP 234* 237* 266* 203* 212*    Recent Results (from the past 240 hour(s))  URINE CULTURE     Status: None   Collection Time    12/04/13 10:48 PM  Result Value Ref Range Status   Specimen Description URINE, CATHETERIZED   Final   Special Requests NONE   Final   Culture  Setup Time     Final   Value: 12/05/2013 00:04     Performed at [facility]   Colony Count     Final   Value: >=100,000 COLONIES/ML     Performed at [facility]   Culture     Final   Value: ESCHERICHIA COLI     Performed at [facility]   Report Status 12/07/2013 FINAL   Final   Organism ID, Bacteria ESCHERICHIA COLI   Final  CULTURE, BLOOD (ROUTINE X 2)     Status: None   Collection Time    12/04/13 10:58 PM      Result Value Ref Range Status   Specimen Description BLOOD BLOOD RIGHT FOREARM   Final   Special Requests     Final   Value: BOTTLES DRAWN AEROBIC AND ANAEROBIC 7CC AEROBIC 3CC ANAEROBIC   Culture  Setup Time     Final   Value: 12/05/2013 04:57     Performed at [facility]   Culture     Final   Value:        BLOOD CULTURE RECEIVED NO GROWTH TO DATE CULTURE WILL BE HELD FOR 5 DAYS BEFORE ISSUING A FINAL NEGATIVE REPORT     Performed at [facility]    Report Status PENDING   Incomplete  CULTURE, BLOOD (ROUTINE X 2)     Status: None   Collection Time    12/04/13 10:58 PM      Result Value Ref Range Status   Specimen Description BLOOD BLOOD LEFT FOREARM   Final   Special Requests BOTTLES DRAWN AEROBIC AND ANAEROBIC 10CC EACH   Final   Culture  Setup Time     Final   Value: 12/05/2013 04:57     Performed at [facility]   Culture     Final   Value: STAPHYLOCOCCUS SPECIES (COAGULASE NEGATIVE)     Note: THE SIGNIFICANCE OF ISOLATING THIS ORGANISM FROM A SINGLE SET OF BLOOD CULTURES WHEN MULTIPLE SETS ARE DRAWN IS UNCERTAIN. PLEASE NOTIFY THE MICROBIOLOGY DEPARTMENT WITHIN ONE WEEK IF SPECIATION AND SENSITIVITIES ARE REQUIRED.     Note: Gram Stain Report Called to,Read Back By and Verified With: LEE WORLEY 12/06/13 1242A FULKC     Performed at [facility]   Report Status 12/07/2013 FINAL   Final  MRSA PCR SCREENING     Status: None   Collection Time    12/05/13  6:30 AM      Result Value Ref Range Status   MRSA by PCR NEGATIVE  NEGATIVE Final   Comment:            The GeneXpert MRSA Assay (FDA     approved for NASAL specimens     only), is one component of a     comprehensive MRSA colonization     surveillance program. It is not     intended to diagnose MRSA     infection nor to guide or     monitor treatment for     MRSA infections.     Studies:  Recent x-ray studies have been reviewed in detail by the Attending Physician  Lonia BloodJeffrey T. Kiran Carline, MD [facility] For Consults/Admissions - Flow Manager - [phone] Office  [phone] Pager [phone]  On-Call/Text Page:      [email or web address]  password [email or web address]  If 7PM-7AM, please contact night-coverage [email or web address] Password [email or web address] 12/10/2013, 4:24 PM   LOS: 6 days

## 2013-12-10 NOTE — Progress Notes (Signed)
Patient: [name] Date of Encounter: 12/10/2013, 12:09 PM Admit date: 12/04/2013     Subjective  [name] has no complaints currently. She denies CP or palpitations. She reports her SOB is improving.    Objective  Physical Exam: Vitals: BP 154/63  Pulse 50  Temp(Src) 97.4 F (36.3 C) (Oral)  Resp 22  Ht 5\' 3"  (1.6 m)  Wt 190 lb 14.7 oz (86.6 kg)  BMI 33.83 kg/m2  SpO2 95% General: Well developed, well appearing 80 year old female in no acute distress. Sitting comfortably in bedside chair. Neck: Supple. JVD not elevated. Lungs: Coarse breath sounds throughout with end expiratory wheezes. Breathing is unlabored. Heart: Regular S1 S2 without murmurs, rubs, or gallops.  Abdomen: Soft, non-distended. Extremities: No clubbing or cyanosis. No edema.  Distal pedal pulses are 2+ and equal bilaterally. Neuro: Alert and oriented X 3. Moves all extremities spontaneously. No focal deficits.  Intake/Output:  Intake/Output Summary (Last 24 hours) at 12/10/13 1209 Last data filed at 12/10/13 0500  Gross per 24 hour  Intake    560 ml  Output   1200 ml  Net   -640 ml    Inpatient Medications:  . aspirin EC  81 mg Oral Daily  . azithromycin  250 mg Oral QHS  . cefTRIAXone (ROCEPHIN)  IV  1 g Intravenous Q24H  . enoxaparin (LOVENOX) injection  40 mg Subcutaneous Q24H  . insulin aspart  0-9 Units Subcutaneous TID WC  . insulin aspart  6 Units Subcutaneous TID WC  . insulin glargine  14 Units Subcutaneous QHS  . ipratropium-albuterol  3 mL Nebulization QID  . pantoprazole  40 mg Oral BID  . predniSONE  20 mg Oral BID WC    Labs:  Recent Labs  12/08/13 0420 12/10/13 0229  NA 143 141  K 4.6 4.7  CL 104 105  CO2 26 23  GLUCOSE 308* 285*  BUN 50* 42*  CREATININE 1.50* 1.43*  CALCIUM 9.2 9.1    Recent Labs  12/10/13 0229  WBC 16.3*  HGB 10.7*  HCT 34.1*  MCV 98.3  PLT 174    Radiology/Studies: Dg Chest Port 1 View  12/09/2013   CLINICAL DATA:  Shortness  of breath.  Dyspnea on exertion.  EXAM: PORTABLE CHEST - 1 VIEW  COMPARISON:  Chest x-ray 12/04/2013.  FINDINGS: There is cephalization of the pulmonary vasculature and slight indistinctness of the interstitial markings suggestive of mild pulmonary edema. Focal areas of linear architectural distortion in the right upper lung, right lower lung and left midlung, similar to prior studies, favored to represent areas of post infectious or inflammatory scarring. No definite consolidative airspace disease. Limited study which fails to demonstrate the left lung base. No definite pleural effusions. Heart size is mildly enlarged. The patient is rotated to the right on today's exam, resulting in distortion of the mediastinal contours and reduced diagnostic sensitivity and specificity for mediastinal pathology. Atherosclerosis in the thoracic aorta. Left-sided biventricular pacemaker with lead tips projecting over the expected location of the right atrium, right ventricular apex and overlying the anterior wall of the left ventricle via the coronary sinus and coronary veins.  IMPRESSION: 1. Appearance of the chest again suggests mild congestive heart failure. 2. Chronic bilateral scarring related to prior episodes of infection or inflammation. 3. Atherosclerosis.   Electronically Signed   By: Trudie Reedaniel  Entrikin M.D.   On: 12/09/2013 12:41   Dg Chest Port 1 View  12/04/2013   CLINICAL DATA:  Respiratory  distress.  EXAM: PORTABLE CHEST - 1 VIEW  COMPARISON:  CT CHEST W/O CM dated 08/16/2011; DG CHEST 1V PORT dated 08/14/2011  FINDINGS: Advanced COPD again noted. There may be subtle infiltrates in the right upper lobe and right lower lobe. Parenchymal scarring is present bilaterally. No edema, pneumothorax or pleural effusion is identified. The heart size is normal. There is a stable appearance to a biventricular pacer.  IMPRESSION: Advanced COPD with potential infiltrates in the right upper lobe and right lower lobe.    Electronically Signed   By: Irish LackGlenn  Yamagata M.D.   On: 12/04/2013 23:44    Echocardiogram Study Conclusions - Left ventricle: The cavity size was normal. Wall thickness was normal. Systolic function was normal. The estimated ejection fraction was in the range of 55% to 60%. Wall motion was normal; there were no regional wall motion abnormalities. The study is not technically sufficient to allow evaluation of LV diastolic function. - Ventricular septum: Septal motion showed paradox.  Telemetry: V paced in 50s with underlying atrial fibrillation   Assessment and Plan  1. Acute on chronic respiratory failure 2. Pneumonia 3. COPD on continuous O2 4. Acute on chronic diastolic HF 5. Atrial fibrillation 6. Bradycardia s/p prior PPM implant (CRT-P but LV lead turned off due to diaphragmatic stimulation) 7. Anemia (Hgb 12.4>>10.7)  [name]' AFib is newly diagnosed. She is rate controlled. Her chads2-vasc score is 5 so will need chronic anticoagulation. She denies history of CVA/TIA. She has no history of bleeding, bleeding disorder, nosebleeds, blood in urine or stool. She has never needed blood transfusion and denies falls. Prior to admission, she was ambulating without assistance. MD to advise regarding anticoagulation.   Signed, Minda MeoBrooke O Edmisten PA-C  EP Attending  Agree with above. She will need anti-coagulation with increased stroke risk. Ok to start coumadin but no reason to keep in the hospital while becoming therapeutic.  Leonia ReevesGregg Collyn Ribas,M.D.

## 2013-12-11 DIAGNOSIS — I4892 Unspecified atrial flutter: Secondary | ICD-10-CM

## 2013-12-11 DIAGNOSIS — R0902 Hypoxemia: Secondary | ICD-10-CM

## 2013-12-11 LAB — CULTURE, BLOOD (ROUTINE X 2): Culture: NO GROWTH

## 2013-12-11 LAB — GLUCOSE, CAPILLARY
GLUCOSE-CAPILLARY: 173 mg/dL — AB (ref 70–99)
GLUCOSE-CAPILLARY: 102 mg/dL — AB (ref 70–99)
GLUCOSE-CAPILLARY: 204 mg/dL — AB (ref 70–99)
Glucose-Capillary: 127 mg/dL — ABNORMAL HIGH (ref 70–99)

## 2013-12-11 LAB — PROTIME-INR
INR: 1.03 (ref 0.00–1.49)
Prothrombin Time: 13.3 seconds (ref 11.6–15.2)

## 2013-12-11 MED ORDER — WARFARIN - PHARMACIST DOSING INPATIENT: Freq: Every day | Status: DC

## 2013-12-11 MED ORDER — WARFARIN VIDEO
Freq: Once | Status: AC
  Administered 2013-12-12: 12:00:00

## 2013-12-11 MED ORDER — WARFARIN SODIUM 3 MG PO TABS
3.0000 mg | ORAL_TABLET | Freq: Once | ORAL | Status: AC
  Administered 2013-12-11: 3 mg via ORAL
  Filled 2013-12-11: qty 1

## 2013-12-11 MED ORDER — COUMADIN BOOK
Freq: Once | Status: AC
  Administered 2013-12-11: 22:00:00
  Filled 2013-12-11: qty 1

## 2013-12-11 NOTE — Clinical Social Work Psychosocial (Signed)
Clinical Social Work Department BRIEF PSYCHOSOCIAL ASSESSMENT 12/11/2013  Patient:  [name], [name]     Account Number:  [id number]     Admit date:  12/04/2013  Clinical Social Worker:  Donnella Sham, CLINICAL SOCIAL WORKER  Date/Time:  12/11/2013 01:46 PM  Referred by:  Physician  Date Referred:  12/11/2013 Referred for  SNF Placement   Other Referral:   Interview type:  Family Other interview type:    PSYCHOSOCIAL DATA Living Status:  WITH ADULT CHILDREN Admitted from facility:   Level of care:   Primary support name:  Alvester Chou [phone] Primary support relationship to patient:  CHILD, ADULT Degree of support available:   Strong    Sam(pt lives with this son)  Alvester Chou (Son)- [phone]  [name](daighter)- [phone]    CURRENT CONCERNS Current Concerns  Post-Acute Placement   Other Concerns:    SOCIAL WORK ASSESSMENT / PLAN SW Student met with pt at bedside. Pt noted to be very short of breath. SW Student asked pt if she needed nurse assistance, pt said no. SW student asked pt if SW student could call son and pt agreed. SW Student called son, Alvester Chou. Alvester Chou stated that pt has never been in rehab before and if pt agreed would need to be placed in [facility]. Alvester Chou stated that pt lives with her other son, Inocente Salles. Alvester Chou stated that Sam works 10 hours/day and can't provide 24 hour support. Alvester Chou stated to [facility] student that his siblings were thinking about trying to get an aide to help pt at home. No final plans have been made according to [facility]. SW student went back to assess  pt this afternoon and pt noted to have less respiratory distress. SW student discussed SNF placement with pt and pt refused. SW student called son, Alvester Chou, to inform him of pt's decision. SW Student informed [place] printed a list of private aids that was left at bedside. RNCM will follow up with discharge plan. Student SW signing off, but available if needed.   Assessment/plan status:  No Further  Intervention Required Other assessment/ plan:   Information/referral to community resources:   refered to [facility] for follow up    PATIENT'S/FAMILY'S RESPONSE TO PLAN OF CARE: SW student met with pt at bedside. Pt was alert and oriented. Pt refused SNF.Pt denies any other social work needs. RNCM and son Alvester Chou notfiied. RNCM Rosendo Gros will follow up with discharge plans.    Donnella Sham, [place] intern  [phone]

## 2013-12-11 NOTE — Progress Notes (Signed)
I cosign with Demarcus Steward on all documentation and medication administration for this shift. Leidi Astle A Jazariah Teall, RN  

## 2013-12-11 NOTE — Progress Notes (Signed)
Client desated to 77% and is now 96% on 5 liters.  Placed on 6.5 liters a time until sats returned to normal.  Stayed with patient.  Notified MD.  Notified rapid of issue.

## 2013-12-11 NOTE — Progress Notes (Signed)
Pt BP 172/78 manual. Pt asymptomatic HR in 50s. K Schorr notified. No new orders. Will continue to monitor Huel CoventryMeredith A Addalyne Vandehei, RN

## 2013-12-11 NOTE — Progress Notes (Signed)
ANTICOAGULATION CONSULT NOTE - Initial Consult  Pharmacy Consult for Warfarin Indication: Newly diagnosed Afib  Allergies  Allergen Reactions  . Advair Diskus [Fluticasone-Salmeterol]     Itching  . Biaxin [Clarithromycin]     N/V  . Ciprofloxacin     N/V  . Doxycycline Other (See Comments)    Inside of mouth and tongue red and peeled  . Keflex [Cephalexin]     N/V  . Ketek [Telithromycin]     N/V  . Levaquin [Levofloxacin In D5w]     N/V  . Macrobid [Nitrofurantoin]     N/V  . Penicillins     Rash  . Sulfonamide Derivatives     Patient Measurements: Height: 5\' 3"  (160 cm) Weight: 195 lb 8 oz (88.678 kg) (scale b) IBW/kg (Calculated) : 52.4  Vital Signs: Temp: 98.4 F (36.9 C) (04/21 1443) Temp src: Oral (04/21 1443) BP: 146/56 mmHg (04/21 1443) Pulse Rate: 50 (04/21 1443)  Labs:  Recent Labs  12/10/13 0229  HGB 10.7*  HCT 34.1*  PLT 174  CREATININE 1.43*    Estimated Creatinine Clearance: 33.1 ml/min (by C-G formula based on Cr of 1.43).   Medical History: Past Medical History  Diagnosis Date  . AV block, complete   . CHF (congestive heart failure)   . AAA (abdominal aortic aneurysm)   . Dyslipidemia   . History of colonic polyps   . Anxiety and depression   . Diverticulosis of colon   . Asthma   . Acute renal failure   . Atrial flutter   . Hyperglycemia   . UTI (lower urinary tract infection)   . COPD (chronic obstructive pulmonary disease)   . HTN (hypertension)   . DM (diabetes mellitus)   . GERD (gastroesophageal reflux disease)   . DVT (deep venous thrombosis)   . B12 deficiency   . Angiodysplasia of intestine (without mention of hemorrhage)     Assessment: 80 y.o. F to start warfarin for newly diagnosed Afib. Confirmed with MD that no bridging is desired at this time - the patient remains on low dose lovenox for VTE px. Given the patient's advanced age - will start with a low warfarin dose and titrate upwards. Hgb 10.7, plts wnl.  Baseline INR 1.03.  Goal of Therapy:  INR 2-3   Plan:  1. Warfarin 3 mg x 1 dose at 1800 today 2. Daily PT/INR 3. Warfarin book/video 4. Will continue to monitor for any signs/symptoms of bleeding and will follow up with PT/INR in the a.m.   Georgina PillionElizabeth Felipa Laroche, PharmD, BCPS Clinical Pharmacist Pager: [phone] 12/11/2013 5:29 PM

## 2013-12-11 NOTE — Progress Notes (Signed)
Pt daughter concerned with placement SNF [facility]. Requesting to speak to Child psychotherapistsocial worker tomorrow. Daughter's number placed in chart, will pass on to day shift RN tomorrow. Huel CoventryMeredith A Maika Kaczmarek, RN

## 2013-12-11 NOTE — Plan of Care (Signed)
Problem: Phase II Progression Outcomes Goal: Dyspnea controlled w/progressive activity Outcome: Progressing Oxygen dependent and will be discharge as so.  Shortness has decreased per patient since admission

## 2013-12-11 NOTE — Plan of Care (Signed)
Problem: Phase II Progression Outcomes Goal: O2 sats > equal to 90% on RA or at baseline Outcome: Progressing Saturation 94-96% on 5 liters nasal oxygen.

## 2013-12-11 NOTE — Progress Notes (Addendum)
Progress Note  [name] ZOX:[id number] DOB: 06-28-1932 DOA: 12/04/2013 PCP: Nadean Corwin, MD  Time spent : 35 minutes  Brief narrative: 80 year old female patient with multiple medical problems. Chronically on 5 L nasal cannula oxygen at home for her COPD. She also has a history of chronic kidney disease as well as prior CHF and history of permanent pacemaker. On the date of admission her children had been attempting to contact her by phone for several hours but when they were unable to reach her they sent someone to check on the patient. Apparently on the morning of admission she was fine. The person that was sent to check on the patient found her at home on room air and having difficulty breathing noting patient is supposed to be on chronic oxygen. EMS was called to the home and upon their assessment her oxygen saturations were in the 70s. She was immediately placed on BiPAP and brought to the emergency department. Family members confirm patient has been having upper respiratory symptoms consisting of progressive coughing and progressive difficulty breathing. After the above treatment measures were initiated patient became more responsive and alert and reported her shortness of breath had improved. She was also noted to be febrile in the emergency department. Her white count was elevated at 16,200 and a pro BNP was greater than 17,000 without a baseline for comparison.  HPI/Subjective: Feeling "so-so". Wanting to go home.   Assessment/Plan:  Acute on chronic respiratory failure with hypoxia:    -) PNA (pneumonia) / right upper and lower lobe infiltrates     -) COPD (chronic obstructive pulmonary disease) - severe at baseline     -) No evidence of "CHF" -Had similar presentation during hospitalization in 2012 - required intubation at that time -has weaned from BiPAP to baseline of 5L oxygen -Last echocardiogram 2012 revealed mild RV dilatation with preserved LV function and  no evidence of diastolic dysfunction - repeat echo with preserved LV fnx but inadequate to eval for DD- dry with CVP 3- no RV/RA dilatation but paradoxical septal wall motion (see below) -Resume home medical regimen -chest x-ray consistent with pneumonia  -COPD exac appears to be slowly improving keeping in mind that patient has severe disease even at baseline  - If doesn't continue to improve or if she declines? Palliative care consultation for goals of care.  E.Coli UTI -cont empiric anbxs - pan sensitive - d/c foley cath   Thrombocytopenia -has resolved and was likely due to GN infection  Recurrent atrial fib with a controlled VR -care as per Cards / EP. Cardiologist on 4/20 recommended starting Coumadin.  Coag negative positive blood cx's -1/2 cx's positive c/w contaminant - should be adequately covered w/ current abx if by chance not   DM  -CBGs poorly controlled due to IV steroids -Not on diabetic meds prior to admission -adjust tx 4/20 and follow  -HgbA1c 7.1  ? NSTEMI (non-ST elevated myocardial infarction) -Point-of-care troponin mildly elevated at 0.37 but subsequent troponins have been normal  PACEMAKER - [facility] -Followed by Dr. Sharrell Ku as an outpatient -Recent evaluation January 2015 > left ventricular lead turned off due to malfunction/inability to program -Dr. Ladona Ridgel felt patient's multifactorial dyspnea might be related to desynchronous RV pacing so plan was to review angios and consider potentially proceeding with LV lead revision -EP following pt in house   Chronic kidney disease -Creatinine at baseline.  Anemia - Stable. Follow CBCs   DVT prophylaxis: Lovenox Code Status: Full Family  Communication: None at bedside  Disposition Plan/Expected LOS: to tele bed - mobilize - PT/OT - possible home in ~48hrs   Consultants: Cardioloyg > EP   Procedures: 2-D echocardiogram  - Left ventricle: The cavity size was normal. Wall thickness was normal.  Systolic function was normal. The estimated ejection fraction was in the range of 55% to 60%. Wall motion was normal; there were no regional wall motion abnormalities. The study is not technically sufficient to allow evaluation of LV diastolic function. - Ventricular septum: Septal motion showed paradox.  Antibiotics: Zithromax 4/14 >>>4/19 Rocephin 4/14 >>> Vanco 4/15 >>> 4/16  Objective: Blood pressure 146/56, pulse 50, temperature 98.4 F (36.9 C), temperature source Oral, resp. rate 20, height 5\' 3"  (1.6 m), weight 88.678 kg (195 lb 8 oz), SpO2 93.00%.  Intake/Output Summary (Last 24 hours) at 12/11/13 1705 Last data filed at 12/11/13 1415  Gross per 24 hour  Intake   1090 ml  Output    950 ml  Net    140 ml   Exam: General: has to pause during speech to breath - states this is her baseline Lungs: Distant breath sounds, diminished in the bases and occasional basal crackles but no obvious wheezing or rhonchi. Cardiovascular: Regular rate without murmur gallop irregularly irregular. No JVD. Trace bilateral ankle edema. Telemetry: Atrial flutter in the 50s/V. paced Abdomen: Nontender, nondistended, soft, bowel sounds positive, no rebound, no ascites, no appreciable mass Musculoskeletal: No significant cyanosis, clubbing of bilateral lower extremities - no signif LE edema  CNS: Alert and oriented. No focal deficits   Scheduled Meds:  Scheduled Meds: . albuterol  2.5 mg Nebulization QID  . aspirin EC  81 mg Oral Daily  . cefTRIAXone (ROCEPHIN)  IV  1 g Intravenous Q24H  . enoxaparin (LOVENOX) injection  40 mg Subcutaneous Q24H  . insulin aspart  0-9 Units Subcutaneous TID WC  . insulin aspart  8 Units Subcutaneous TID WC  . insulin glargine  16 Units Subcutaneous QHS  . montelukast  10 mg Oral Daily  . pantoprazole  40 mg Oral BID  . predniSONE  10 mg Oral BID WC  . tiotropium  18 mcg Inhalation Daily   Data Reviewed: Basic Metabolic Panel:  Recent Labs Lab  12/05/13 0441 12/06/13 0234 12/07/13 0905 12/08/13 0420 12/10/13 0229  NA 138 139 142 143 141  K 4.1 3.7 3.7 4.6 4.7  CL 94* 94* 99 104 105  CO2 19 26 26 26 23   GLUCOSE 401* 332* 298* 308* 285*  BUN 30* 45* 53* 50* 42*  CREATININE 1.76* 1.97* 1.61* 1.50* 1.43*  CALCIUM 9.1 9.2 9.2 9.2 9.1   Liver Function Tests:  Recent Labs Lab 12/04/13 2258  AST 25  ALT 6  ALKPHOS 65  BILITOT 0.7  PROT 6.8  ALBUMIN 3.4*   CBC:  Recent Labs Lab 12/04/13 2258 12/05/13 0441 12/06/13 0234 12/07/13 0905 12/10/13 0229  WBC 16.2* 14.8* 15.2* 12.8* 16.3*  NEUTROABS 14.0*  --   --   --   --   HGB 12.4 12.1 10.7* 11.3* 10.7*  HCT 39.3 38.9 34.6* 36.6 34.1*  MCV 98.5 100.0 97.7 98.4 98.3  PLT 112* 109* 111* 154 174   Cardiac Enzymes:  Recent Labs Lab 12/04/13 2258 12/05/13 0441 12/05/13 0810 12/05/13 1502  TROPONINI 0.37* <0.30 <0.30 <0.30   BNP (last 3 results)  Recent Labs  12/04/13 2258  PROBNP 17479.0*   CBG:  Recent Labs Lab 12/10/13 2110 12/10/13 2130 12/11/13 0601 12/11/13 1135  12/11/13 1613  GLUCAP 172* 191* 173* 102* 204*    Recent Results (from the past 240 hour(s))  URINE CULTURE     Status: None   Collection Time    12/04/13 10:48 PM      Result Value Ref Range Status   Specimen Description URINE, CATHETERIZED   Final   Special Requests NONE   Final   Culture  Setup Time     Final   Value: 12/05/2013 00:04     Performed at [facility] Count     Final   Value: >=100,000 COLONIES/ML     Performed at [facility]   Culture     Final   Value: ESCHERICHIA COLI     Performed at [facility]   Report Status 12/07/2013 FINAL   Final   Organism ID, Bacteria ESCHERICHIA COLI   Final  CULTURE, BLOOD (ROUTINE X 2)     Status: None   Collection Time    12/04/13 10:58 PM      Result Value Ref Range Status   Specimen Description BLOOD BLOOD RIGHT FOREARM   Final   Special Requests     Final   Value: BOTTLES DRAWN  AEROBIC AND ANAEROBIC 7CC AEROBIC 3CC ANAEROBIC   Culture  Setup Time     Final   Value: 12/05/2013 04:57     Performed at [facility]   Culture     Final   Value: NO GROWTH 5 DAYS     Performed at [facility]   Report Status 12/11/2013 FINAL   Final  CULTURE, BLOOD (ROUTINE X 2)     Status: None   Collection Time    12/04/13 10:58 PM      Result Value Ref Range Status   Specimen Description BLOOD BLOOD LEFT FOREARM   Final   Special Requests BOTTLES DRAWN AEROBIC AND ANAEROBIC 10CC EACH   Final   Culture  Setup Time     Final   Value: 12/05/2013 04:57     Performed at [facility]   Culture     Final   Value: STAPHYLOCOCCUS SPECIES (COAGULASE NEGATIVE)     Note: THE SIGNIFICANCE OF ISOLATING THIS ORGANISM FROM A SINGLE SET OF BLOOD CULTURES WHEN MULTIPLE SETS ARE DRAWN IS UNCERTAIN. PLEASE NOTIFY THE MICROBIOLOGY DEPARTMENT WITHIN ONE WEEK IF SPECIATION AND SENSITIVITIES ARE REQUIRED.     Note: Gram Stain Report Called to,Read Back By and Verified With: LEE WORLEY 12/06/13 1242A FULKC     Performed at [facility]   Report Status 12/07/2013 FINAL   Final  MRSA PCR SCREENING     Status: None   Collection Time    12/05/13  6:30 AM      Result Value Ref Range Status   MRSA by PCR NEGATIVE  NEGATIVE Final   Comment:            The GeneXpert MRSA Assay (FDA     approved for NASAL specimens     only), is one component of a     comprehensive MRSA colonization     surveillance program. It is not     intended to diagnose MRSA     infection nor to guide or     monitor treatment for     MRSA infections.     Studies:  Recent x-ray studies have been reviewed in detail by the Attending Physician  Elease Etienne, MD, FACP, [facility]. [facility] Pager [phone]  If 7PM-7AM, please contact night-coverage [email or web address] Password [email or web address] 12/11/2013, 5:12 PM   LOS: 7 days

## 2013-12-12 LAB — CBC
HCT: 35.1 % — ABNORMAL LOW (ref 36.0–46.0)
Hemoglobin: 10.7 g/dL — ABNORMAL LOW (ref 12.0–15.0)
MCH: 30.2 pg (ref 26.0–34.0)
MCHC: 30.5 g/dL (ref 30.0–36.0)
MCV: 99.2 fL (ref 78.0–100.0)
PLATELETS: 161 10*3/uL (ref 150–400)
RBC: 3.54 MIL/uL — ABNORMAL LOW (ref 3.87–5.11)
RDW: 13.8 % (ref 11.5–15.5)
WBC: 11.9 10*3/uL — ABNORMAL HIGH (ref 4.0–10.5)

## 2013-12-12 LAB — GLUCOSE, CAPILLARY
GLUCOSE-CAPILLARY: 232 mg/dL — AB (ref 70–99)
Glucose-Capillary: 112 mg/dL — ABNORMAL HIGH (ref 70–99)
Glucose-Capillary: 135 mg/dL — ABNORMAL HIGH (ref 70–99)
Glucose-Capillary: 83 mg/dL (ref 70–99)

## 2013-12-12 LAB — BASIC METABOLIC PANEL
BUN: 31 mg/dL — ABNORMAL HIGH (ref 6–23)
CALCIUM: 8.9 mg/dL (ref 8.4–10.5)
CO2: 27 meq/L (ref 19–32)
CREATININE: 1.49 mg/dL — AB (ref 0.50–1.10)
Chloride: 107 mEq/L (ref 96–112)
GFR calc Af Amer: 37 mL/min — ABNORMAL LOW (ref >90)
GFR calc non Af Amer: 32 mL/min — ABNORMAL LOW (ref >90)
Glucose, Bld: 114 mg/dL — ABNORMAL HIGH (ref 70–99)
Potassium: 4.3 mEq/L (ref 3.7–5.3)
Sodium: 147 mEq/L (ref 137–147)

## 2013-12-12 LAB — PROTIME-INR
INR: 1.05 (ref 0.00–1.49)
Prothrombin Time: 13.5 seconds (ref 11.6–15.2)

## 2013-12-12 MED ORDER — WARFARIN SODIUM 5 MG PO TABS
5.0000 mg | ORAL_TABLET | Freq: Once | ORAL | Status: AC
  Administered 2013-12-12: 5 mg via ORAL
  Filled 2013-12-12: qty 1

## 2013-12-12 NOTE — Progress Notes (Signed)
CSW spoke with patient's daughter Tarry KosJoy Dalton (per patient's permission) to discuss d/c plan. She stated that she is planning to bring patient home and is currently working on arranging 24 hour help at home. RNCM has been in contact with her regarding Home Health. Daughter does not wish SNF placement for patient. She denies any further questions or needs of CSW services. CSW signing off.  Lorri Frederickonna T. West PughCrowder, LCSWA  [phone]

## 2013-12-12 NOTE — Progress Notes (Signed)
Patient evaluated for community based chronic disease management services with [facility] as a benefit of patient's [employer]. Spoke with patient at bedside to explain [facility]. She lives with her son who is not always able to get her to physician appointments.  [facility].  Has oxygen at home currently.  Written consents obtained.  Patient will receive a post discharge transition of care call and will be evaluated for monthly home visits for assessments and disease process education.  Left contact information and [facility] literature at bedside. Made Inpatient Case Manager aware that [facility] following. Of note, [facility] Care [facility] services does not replace or interfere with any services that are arranged by inpatient case management or social work.  For additional questions or referrals please contact Anibal Hendersonim Henderson BSN RN [facility] Liaison at [phone].

## 2013-12-12 NOTE — Progress Notes (Signed)
Physical Therapy Treatment Patient Details Name: [name] MRN: [id number] DOB: [birth year] Today's Date: 12/12/2013    History of Present Illness This 80 yo female with h/o COPD who is on 5L 02 at home was admitted with respiratory distress and initially required BiPaP.  Dx:  PNA; UTI; ? sepsis; ? NSTEMI as her troponins were initially elevated.     PT Comments    Pt limited by SOB today, requires frequent seated rest breaks and increased time to recover during session.  Pt continues to require min A for mobility, PT recommends 24 hour supervision/assistance at home, if unable to provide then SNF would be appropriate.  Follow Up Recommendations  Home health PT;Supervision/Assistance - 24 hour;SNF     Equipment Recommendations  None recommended by PT    Recommendations for Other Services       Precautions / Restrictions Precautions Precautions: Fall Restrictions Weight Bearing Restrictions: No    Mobility  Bed Mobility   Bed Mobility: Supine to Sit;Sit to Supine     Supine to sit: Supervision Sit to supine: Supervision   General bed mobility comments: cues for safety  Transfers Overall transfer level: Needs assistance Equipment used: Rolling walker (2 wheeled)   Sit to Stand: Min assist Stand pivot transfers: Min assist       General transfer comment: pt requires lifting and steadying assist, cues for UE placement and safety, cues for deep breathing.  Pt min A [facility], continued cues for safety  Ambulation/Gait Ambulation/Gait assistance: Min assist Ambulation Distance (Feet): 20 Feet (x 2) Assistive device: Rolling walker (2 wheeled)     Gait velocity interpretation: <1.8 ft/sec, indicative of risk for recurrent falls General Gait Details: pt requires min A for balance and safety, required seated rest break x 5 minutes with cues for deep breathing.  spO2 92% on 6L O2 during gait.     Stairs            Wheelchair Mobility     Modified Rankin (Stroke Patients Only)       Balance                                    Cognition Arousal/Alertness: Awake/alert Behavior During Therapy: Flat affect Overall Cognitive Status: Impaired/Different from baseline Area of Impairment: Safety/judgement               General Comments: pt with decreased safety awareness    Exercises      General Comments        Pertinent Vitals/Pain No c/o pain, spO2 92% on 6LO2 during mobility    Home Living                      Prior Function            PT Goals (current goals can now be found in the care plan section) Progress towards PT goals: Progressing toward goals    Frequency  Min 3X/week    PT Plan Current plan remains appropriate    Co-evaluation             End of Session Equipment Utilized During Treatment: Gait belt;Oxygen Activity Tolerance: Patient limited by fatigue Patient left: in bed;with call bell/phone within reach     Time: 0912-0942 PT Time Calculation (min): 30 min  Charges:  $Gait Training: 8-22 mins $Therapeutic Activity: 8-22 mins  G Codes:      [name] 12/12/2013, 10:21 AM

## 2013-12-12 NOTE — Clinical Social Work Psychosocial (Signed)
I have read and reviewed SW Intern's Brief Psychosocial Assessment and agree with it's content.  CSW is currently attempting to reach patient's daughter who has requested to talk to either a SW or RNCM. Patient has given consent to talk to her daughter.  Currently- patient is declining SNF and d/c plan is for d/c home with home health when medically stable.  RNCM is aware. Lorri Frederickonna T. West PughCrowder, LCSWA  [phone]

## 2013-12-12 NOTE — Progress Notes (Signed)
ANTICOAGULATION CONSULT NOTE - Follow Up Consult  Pharmacy Consult for warfarin Indication: atrial fibrillation - newly diagnosed  Allergies  Allergen Reactions  . Advair Diskus [Fluticasone-Salmeterol]     Itching  . Biaxin [Clarithromycin]     N/V  . Ciprofloxacin     N/V  . Doxycycline Other (See Comments)    Inside of mouth and tongue red and peeled  . Keflex [Cephalexin]     N/V  . Ketek [Telithromycin]     N/V  . Levaquin [Levofloxacin In D5w]     N/V  . Macrobid [Nitrofurantoin]     N/V  . Penicillins     Rash  . Sulfonamide Derivatives     Patient Measurements: Height: 5\' 3"  (160 cm) Weight: 192 lb 9.6 oz (87.363 kg) (Scale C) IBW/kg (Calculated) : 52.4 Vital Signs: Temp: 97.3 F (36.3 C) (04/22 0451) Temp src: Oral (04/22 0451) BP: 170/62 mmHg (04/22 0451) Labs:  Recent Labs  12/10/13 0229 12/11/13 1907 12/12/13 0540  HGB 10.7*  --  10.7*  HCT 34.1*  --  35.1*  PLT 174  --  161  LABPROT  --  13.3 13.5  INR  --  1.03 1.05  CREATININE 1.43*  --  1.49*   Estimated Creatinine Clearance: 31.6 ml/min (by C-G formula based on Cr of 1.49).  Assessment: [age] YOF with new diagnosis of atrial fibrillation started on warfarin therapy 4/21. INR is 1.05 today after 1 dose of 3mg . LFTs are within normal limits and albumin 3.4 as of 4/15. H/H is low but stable. Platelets were low on admission but have improved up to 161. No bleeding reported. Patient is now off antibiotics so risk of interaction decreased.   Goal of Therapy:  INR 2-3 Monitor platelets by anticoagulation protocol: Yes   Plan:  1. Coumadin 5mg  po x1 tonight. 2. Follow-up PT/INR in AM.   Link SnufferJessica Dariya Gainer, PharmD, BCPS Clinical Pharmacist [phone] 12/12/2013,10:50 AM

## 2013-12-12 NOTE — Progress Notes (Signed)
Occupational Therapy Treatment Patient Details Name: [name] MRN: [id number] DOB: [birth year] Today's Date: 12/12/2013    History of present illness This 80 yo female with h/o COPD who is on 5L 02 at home was admitted with respiratory distress and initially required BiPaP.  Dx:  PNA; UTI; ? sepsis; ? NSTEMI as her troponins were initially elevated.    OT comments  Pt making progress with functionale goals and should continue with acute OT services to increase level of function and safety  Follow Up Recommendations  SNF;Supervision/Assistance - 24 hour    Equipment Recommendations  Tub/shower seat    Recommendations for Other Services      Precautions / Restrictions Precautions Precautions: Fall Precaution Comments: On 5 liters of O2 at home, desats into the 70's on 6 liters of O2 with activity with increased, increased time and cues for purse lipped breathing Restrictions Weight Bearing Restrictions: No       Mobility Bed Mobility Overal bed mobility: Needs Assistance Bed Mobility: Supine to Sit;Sit to Supine     Supine to sit: Supervision Sit to supine: Supervision      Transfers Overall transfer level: Needs assistance Equipment used: Rolling walker (2 wheeled) Transfers: Sit to/from [employer] CorporationStand;Stand Pivot Transfers Sit to Stand: Min assist Stand pivot transfers: Min assist       General transfer comment: pt requires lifting and steadying assist, cues for UE placement and safety, cues for deep breathing.  Pt min A with sit to stand from [facility], continued cues for safety    Balance Overall balance assessment: Needs assistance Sitting-balance support: No upper extremity supported;Feet supported Sitting balance-Leahy Scale: Good     Standing balance support: Single extremity supported;Bilateral upper extremity supported;During functional activity Standing balance-Leahy Scale: Fair                     ADL       Grooming: Wash/dry hands;Wash/dry  face;Standing;Min guard   Upper Body Bathing: Minimal assitance;Sitting   Lower Body Bathing: Sit to/from stand;Moderate assistance   Upper Body Dressing : Minimal assistance;Sitting   Lower Body Dressing: Maximal assistance;Sit to/from stand   Toilet Transfer: Minimal assistance;BSC;RW   Toileting- Clothing Manipulation and Hygiene: Moderate assistance;Sit to/from stand         General ADL Comments: pt requires lifting and steadying assist, cues for UE placement and safety, cues for deep breathing.  Pt min A with sit to [facility] from [facility], continued cues for safety. O2 SATs decrease to 77-81% after BSC transfer, required 5 minutes to recover to 90-93%      Vision  wears reading glasses                              Cognition   Behavior During Therapy: Flat affect Overall Cognitive Status: Within Functional Limits for tasks assessed                                                  General Comments  Pt fatigues easily and requires increased time with multiple rest breaks to complete functional tasks    Pertinent Vitals/ Pain       No c/o pain. O2 SATs 89-90% at rest supine in bed, dropped to 77-81% after BSC transfer and pt required approx 5 minutes to recover  to 90-93%                                                          Frequency Min 2X/week     Progress Toward Goals  OT Goals(current goals can now be found in the care plan section)  Progress towards OT goals: Progressing toward goals  Acute Rehab OT Goals Patient Stated Goal: to go home  Plan Discharge plan remains appropriate                     End of Session Equipment Utilized During Treatment: Rolling walker;Oxygen;Other (comment) (BSC)   Activity Tolerance Patient limited by fatigue   Patient Left with call bell/phone within reach;in bed;with bed alarm set;with nursing/sitter in room   Nurse Communication          Time:  [id number] OT Time Calculation (min): 26 min  Charges: OT General Charges $OT Visit: 1 Procedure OT Treatments $Self Care/Home Management : 8-22 mins $Therapeutic Activity: 8-22 mins  Lafe GarinDenise J Torii Royse 12/12/2013, 3:11 PM

## 2013-12-12 NOTE — Progress Notes (Signed)
I cosign with Demarcus Steward on all documentation and medication administration for this shift.  

## 2013-12-12 NOTE — Progress Notes (Signed)
Progress Note  [name] VWU:[id number] DOB: 1932/03/28 DOA: 12/04/2013 PCP: Nadean Corwin, MD  Time spent : 35 minutes  Brief narrative: 80 year old female patient with multiple medical problems. Chronically on 5 L nasal cannula oxygen at home for her COPD. She also has a history of chronic kidney disease as well as prior CHF and history of permanent pacemaker. On the date of admission her children had been attempting to contact her by phone for several hours but when they were unable to reach her they sent someone to check on the patient. Apparently on the morning of admission she was fine. The person that was sent to check on the patient found her at home on room air and having difficulty breathing noting patient is supposed to be on chronic oxygen. EMS was called to the home and upon their assessment her oxygen saturations were in the 70s. She was immediately placed on BiPAP and brought to the emergency department. Family members confirm patient has been having upper respiratory symptoms consisting of progressive coughing and progressive difficulty breathing. After the above treatment measures were initiated patient became more responsive and alert and reported her shortness of breath had improved. She was also noted to be febrile in the emergency department. Her white count was elevated at 16,200 and a pro BNP was greater than 17,000 without a baseline for comparison.  HPI/Subjective: Breathing 50% better. Productive cough decreasing. Wanting to go home.   Assessment/Plan:  Acute on chronic respiratory failure with hypoxia:    -) PNA (pneumonia) / right upper and lower lobe infiltrates     -) COPD (chronic obstructive pulmonary disease) - severe at baseline     -) No evidence of "CHF" - Had similar presentation during hospitalization in 2012 - required intubation at that time - has weaned from BiPAP to baseline of 5L oxygen - Last echocardiogram 2012 revealed mild RV  dilatation with preserved LV function and no evidence of diastolic dysfunction - repeat echo with preserved LV fnx but inadequate to eval for DD- dry with CVP 3- no RV/RA dilatation but paradoxical septal wall motion (see below) - Resume home medical regimen - chest x-ray consistent with pneumonia  - COPD exac appears to be slowly improving keeping in mind that patient has severe disease even at baseline  - If doesn't continue to improve or if she declines? Palliative care consultation for goals of care. - States breathing is 50% better. Will ambulate today and monitor breathing and O2 sats. Did briefly require higher O2 on 4/21.   E.Coli UTI -cont empiric anbxs - pan sensitive - d/c foley cath   Thrombocytopenia -has resolved and was likely due to GN infection  Recurrent atrial fib with a controlled VR -care as per Cards / EP. Cardiologist on 4/20 recommended starting Coumadin- started 4/21.  Coag negative positive blood cx's -1/2 cx's positive c/w contaminant - should be adequately covered w/ current abx if by chance not   DM  -CBGs poorly controlled due to IV steroids -Not on diabetic meds prior to admission -adjust tx 4/20 and follow  -HgbA1c 7.1  ? NSTEMI (non-ST elevated myocardial infarction) -Point-of-care troponin mildly elevated at 0.37 but subsequent troponins have been normal  PACEMAKER - [facility] -Followed by Dr. Sharrell Ku as an outpatient -Recent evaluation January 2015 > left ventricular lead turned off due to malfunction/inability to program -Dr. Ladona Ridgel; no plans for changes to PPm at this time -EP signed off  Chronic kidney disease -Creatinine at  baseline. Na increased slightly- ? Volume depletion- not on lasix- encouraged increased water intake and recheck BMP in am.  Anemia - Stable. Follow CBCs   DVT prophylaxis: Lovenox Code Status: Full Family Communication: Discussed at length with daughter Ms. Tarry KosJoy Dalton, updated care, answered questions, discussed  option of OP hospice consultation through PCP Disposition Plan/Expected LOS: Possible DC 2/23 after family able to arrange 24/7 supervision (family working on it). Patient declines SNF.  Consultants: Cardioloyg > EP   Procedures: 2-D echocardiogram  - Left ventricle: The cavity size was normal. Wall thickness was normal. Systolic function was normal. The estimated ejection fraction was in the range of 55% to 60%. Wall motion was normal; there were no regional wall motion abnormalities. The study is not technically sufficient to allow evaluation of LV diastolic function. - Ventricular septum: Septal motion showed paradox.  Antibiotics: Zithromax 4/14 >>>4/19 Rocephin 4/14 >>> Vanco 4/15 >>> 4/16  Objective: Blood pressure 170/62, pulse 52, temperature 97.3 F (36.3 C), temperature source Oral, resp. rate 18, height 5\' 3"  (1.6 m), weight 87.363 kg (192 lb 9.6 oz), SpO2 99.00%.  Intake/Output Summary (Last 24 hours) at 12/12/13 0846 Last data filed at 12/12/13 0815  Gross per 24 hour  Intake    600 ml  Output   1300 ml  Net   -700 ml   Exam: General: pleasant elderly female lying comfortably in bed Lungs: Distant breath sounds,but otherwise clear to auscultation. No increased WOB. Speaks comfortably in short sentences. Cardiovascular: S1 S2, irregularly irregular. No JVD. Trace bilateral ankle edema. Telemetry: Atrial flutter in the 50s/V. paced Abdomen: Nontender, nondistended, soft, bowel sounds positive, no rebound, no ascites, no appreciable mass Musculoskeletal: No significant cyanosis, clubbing of bilateral lower extremities - no signif LE edema  CNS: Alert and oriented. No focal deficits   Scheduled Meds:  Scheduled Meds: . albuterol  2.5 mg Nebulization QID  . aspirin EC  81 mg Oral Daily  . enoxaparin (LOVENOX) injection  40 mg Subcutaneous Q24H  . insulin aspart  0-9 Units Subcutaneous TID WC  . insulin aspart  8 Units Subcutaneous TID WC  . insulin glargine  16  Units Subcutaneous QHS  . montelukast  10 mg Oral Daily  . pantoprazole  40 mg Oral BID  . predniSONE  10 mg Oral BID WC  . tiotropium  18 mcg Inhalation Daily  . warfarin   Does not apply Once  . Warfarin - Pharmacist Dosing Inpatient   Does not apply q1800   Data Reviewed: Basic Metabolic Panel:  Recent Labs Lab 12/06/13 0234 12/07/13 0905 12/08/13 0420 12/10/13 0229 12/12/13 0540  NA 139 142 143 141 147  K 3.7 3.7 4.6 4.7 4.3  CL 94* 99 104 105 107  CO2 26 26 26 23 27   GLUCOSE 332* 298* 308* 285* 114*  BUN 45* 53* 50* 42* 31*  CREATININE 1.97* 1.61* 1.50* 1.43* 1.49*  CALCIUM 9.2 9.2 9.2 9.1 8.9   Liver Function Tests: No results found for this basename: AST, ALT, ALKPHOS, BILITOT, PROT, ALBUMIN,  in the last 168 hours CBC:  Recent Labs Lab 12/06/13 0234 12/07/13 0905 12/10/13 0229 12/12/13 0540  WBC 15.2* 12.8* 16.3* 11.9*  HGB 10.7* 11.3* 10.7* 10.7*  HCT 34.6* 36.6 34.1* 35.1*  MCV 97.7 98.4 98.3 99.2  PLT 111* 154 174 161   Cardiac Enzymes:  Recent Labs Lab 12/05/13 1502  TROPONINI <0.30   BNP (last 3 results)  Recent Labs  12/04/13 2258  PROBNP 17479.0*  CBG:  Recent Labs Lab 12/11/13 0601 12/11/13 1135 12/11/13 1613 12/11/13 2110 12/12/13 0607  GLUCAP 173* 102* 204* 127* 112*    Recent Results (from the past 240 hour(s))  URINE CULTURE     Status: None   Collection Time    12/04/13 10:48 PM      Result Value Ref Range Status   Specimen Description URINE, CATHETERIZED   Final   Special Requests NONE   Final   Culture  Setup Time     Final   Value: 12/05/2013 00:04     Performed at [facility]   Colony Count     Final   Value: >=100,000 COLONIES/ML     [facility]formed at [facility]   Culture     Final   Value: ESCHERICHIA COLI     Performed at [facility]   Report Status 12/07/2013 FINAL   Final   Organism ID, Bacteria ESCHERICHIA COLI   Final  CULTURE, BLOOD (ROUTINE X 2)     Status: None    Collection Time    12/04/13 10:58 PM      Result Value Ref Range Status   Specimen Description BLOOD BLOOD RIGHT FOREARM   Final   Special Requests     Final   Value: BOTTLES DRAWN AEROBIC AND ANAEROBIC 7CC AEROBIC 3CC ANAEROBIC   Culture  Setup Time     Final   Value: 12/05/2013 04:57     Performed at [facility]   Culture     Final   Value: NO GROWTH 5 DAYS     Performed at [facility]   Report Status 12/11/2013 FINAL   Final  CULTURE, BLOOD (ROUTINE X 2)     Status: None   Collection Time    12/04/13 10:58 PM      Result Value Ref Range Status   Specimen Description BLOOD BLOOD LEFT FOREARM   Final   Special Requests BOTTLES DRAWN AEROBIC AND ANAEROBIC 10CC EACH   Final   Culture  Setup Time     Final   Value: 12/05/2013 04:57     Performed at [facility]   Culture     Final   Value: STAPHYLOCOCCUS SPECIES (COAGULASE NEGATIVE)     Note: THE SIGNIFICANCE OF ISOLATING THIS ORGANISM FROM A SINGLE SET OF BLOOD CULTURES WHEN MULTIPLE SETS ARE DRAWN IS UNCERTAIN. PLEASE NOTIFY THE MICROBIOLOGY DEPARTMENT WITHIN ONE WEEK IF SPECIATION AND SENSITIVITIES ARE REQUIRED.     Note: Gram Stain Report Called to,Read Back By and Verified With: LEE WORLEY 12/06/13 1242A FULKC     Performed at [facility]   Report Status 12/07/2013 FINAL   Final  MRSA PCR SCREENING     Status: None   Collection Time    12/05/13  6:30 AM      Result Value Ref Range Status   MRSA by PCR NEGATIVE  NEGATIVE Final   Comment:            The GeneXpert MRSA Assay (FDA     approved for NASAL specimens     only), is one component of a     comprehensive MRSA colonization     surveillance program. It is not     intended to diagnose MRSA     infection nor to guide or     monitor treatment for     MRSA infections.     Studies:  Recent x-ray studies have been reviewed in  detail by the Attending Physician  Elease Etienne, MD, FACP, [facility]. [facility] Pager [phone]  If  7PM-7AM, please contact night-coverage [email or web address] Password [facility] 12/12/2013, 8:46 AM   LOS: 8 days

## 2013-12-12 NOTE — Discharge Instructions (Signed)
Information on my medicine - Coumadin®   (Warfarin) ° °This medication education was reviewed with me or my healthcare representative as part of my discharge preparation.  The pharmacist that spoke with me during my hospital stay was:  Fardeen Steinberger Brown Djibril Glogowski, RPH ° °Why was Coumadin prescribed for you? °Coumadin was prescribed for you because you have a blood clot or a medical condition that can cause an increased risk of forming blood clots. Blood clots can cause serious health problems by blocking the flow of blood to the heart, lung, or brain. Coumadin can prevent harmful blood clots from forming. °As a reminder your indication for Coumadin is:   Stroke Prevention Because Of Atrial Fibrillation  ° °What test will check on my response to Coumadin? °While on Coumadin (warfarin) you will need to have an INR test regularly to ensure that your dose is keeping you in the desired range. The INR (international normalized ratio) number is calculated from the result of the laboratory test called prothrombin time (PT). ° °If an INR APPOINTMENT HAS NOT ALREADY BEEN MADE FOR YOU please schedule an appointment to have this lab work done by your health care provider within 7 days. °Your INR goal is usually a number between:  2 to 3 or your provider may give you a more narrow range like 2-2.5.  Ask your health care provider during an office visit what your goal INR is. ° °What  do you need to  know  About  COUMADIN? °Take Coumadin (warfarin) exactly as prescribed by your healthcare provider about the same time each day.  DO NOT stop taking without talking to the doctor who prescribed the medication.  Stopping without other blood clot prevention medication to take the place of Coumadin may increase your risk of developing a new clot or stroke.  Get refills before you run out. ° °What do you do if you miss a dose? °If you miss a dose, take it as soon as you remember on the same day then continue your regularly scheduled regimen the  next day.  Do not take two doses of Coumadin at the same time. ° °Important Safety Information °A possible side effect of Coumadin (Warfarin) is an increased risk of bleeding. You should call your healthcare provider right away if you experience any of the following: °  Bleeding from an injury or your nose that does not stop. °  Unusual colored urine (red or dark brown) or unusual colored stools (red or black). °  Unusual bruising for unknown reasons. °  A serious fall or if you hit your head (even if there is no bleeding). ° °Some foods or medicines interact with Coumadin® (warfarin) and might alter your response to warfarin. To help avoid this: °  Eat a balanced diet, maintaining a consistent amount of Vitamin K. °  Notify your provider about major diet changes you plan to make. °  Avoid alcohol or limit your intake to 1 drink for women and 2 drinks for men per day. °(1 drink is 5 oz. wine, 12 oz. beer, or 1.5 oz. liquor.) ° °Make sure that ANY health care provider who prescribes medication for you knows that you are taking Coumadin (warfarin).  Also make sure the healthcare provider who is monitoring your Coumadin knows when you have started a new medication including herbals and non-prescription products. ° °Coumadin® (Warfarin)  Major Drug Interactions  °Increased Warfarin Effect Decreased Warfarin Effect  °Alcohol (large quantities) °Antibiotics (esp. Septra/Bactrim, Flagyl, Cipro) °Amiodarone (Cordarone) °  Aspirin (ASA) °Cimetidine (Tagamet) °Megestrol (Megace) °NSAIDs (ibuprofen, naproxen, etc.) °Piroxicam (Feldene) °Propafenone (Rythmol SR) °Propranolol (Inderal) °Isoniazid (INH) °Posaconazole (Noxafil) Barbiturates (Phenobarbital) °Carbamazepine (Tegretol) °Chlordiazepoxide (Librium) °Cholestyramine (Questran) °Griseofulvin °Oral Contraceptives °Rifampin °Sucralfate (Carafate) °Vitamin K  ° °Coumadin® (Warfarin) Major Herbal Interactions  °Increased Warfarin Effect Decreased Warfarin Effect   °Garlic °Ginseng °Ginkgo biloba Coenzyme Q10 °Green tea °St. John’s wort   ° °Coumadin® (Warfarin) FOOD Interactions  °Eat a consistent number of servings per week of foods HIGH in Vitamin K °(1 serving = ½ cup)  °Collards (cooked, or boiled & drained) °Kale (cooked, or boiled & drained) °Mustard greens (cooked, or boiled & drained) °Parsley *serving size only = ¼ cup °Spinach (cooked, or boiled & drained) °Swiss chard (cooked, or boiled & drained) °Turnip greens (cooked, or boiled & drained)  °Eat a consistent number of servings per week of foods MEDIUM-HIGH in Vitamin K °(1 serving = 1 cup)  °Asparagus (cooked, or boiled & drained) °Broccoli (cooked, boiled & drained, or raw & chopped) °Brussel sprouts (cooked, or boiled & drained) *serving size only = ½ cup °Lettuce, raw (green leaf, endive, romaine) °Spinach, raw °Turnip greens, raw & chopped  ° °These websites have more information on Coumadin (warfarin):  www.coumadin.com; °www.ahrq.gov/consumer/coumadin.htm; ° ° ° °

## 2013-12-12 NOTE — Progress Notes (Signed)
Entered follow-up into EPIC and discharge AVS. Coumadin clinic Monday 12/17/2013. Dr. Ladona Ridgelaylor Monday 01/07/2014.

## 2013-12-13 LAB — BASIC METABOLIC PANEL
BUN: 29 mg/dL — AB (ref 6–23)
BUN: 27 mg/dL — ABNORMAL HIGH (ref 6–23)
CHLORIDE: 105 meq/L (ref 96–112)
CO2: 28 mEq/L (ref 19–32)
CO2: 28 mEq/L (ref 19–32)
Calcium: 8.6 mg/dL (ref 8.4–10.5)
Calcium: 8.8 mg/dL (ref 8.4–10.5)
Chloride: 103 mEq/L (ref 96–112)
Creatinine, Ser: 1.5 mg/dL — ABNORMAL HIGH (ref 0.50–1.10)
Creatinine, Ser: 1.41 mg/dL — ABNORMAL HIGH (ref 0.50–1.10)
GFR calc non Af Amer: 32 mL/min — ABNORMAL LOW (ref >90)
GFR calc non Af Amer: 34 mL/min — ABNORMAL LOW (ref >90)
GFR, EST AFRICAN AMERICAN: 37 mL/min — AB (ref >90)
GFR, EST AFRICAN AMERICAN: 40 mL/min — AB (ref >90)
Glucose, Bld: 142 mg/dL — ABNORMAL HIGH (ref 70–99)
Glucose, Bld: 108 mg/dL — ABNORMAL HIGH (ref 70–99)
POTASSIUM: 5.5 meq/L — AB (ref 3.7–5.3)
POTASSIUM: 4.4 meq/L (ref 3.7–5.3)
SODIUM: 143 meq/L (ref 137–147)
SODIUM: 142 meq/L (ref 137–147)

## 2013-12-13 LAB — GLUCOSE, CAPILLARY
GLUCOSE-CAPILLARY: 120 mg/dL — AB (ref 70–99)
Glucose-Capillary: 71 mg/dL (ref 70–99)

## 2013-12-13 LAB — PROTIME-INR
INR: 1.19 (ref 0.00–1.49)
PROTHROMBIN TIME: 14.8 s (ref 11.6–15.2)

## 2013-12-13 MED ORDER — WARFARIN SODIUM 5 MG PO TABS: 5.0000 mg | ORAL_TABLET | Freq: Every day | ORAL | Status: DC

## 2013-12-13 MED ORDER — WARFARIN SODIUM 5 MG PO TABS
5.0000 mg | ORAL_TABLET | Freq: Once | ORAL | Status: DC
  Filled 2013-12-13: qty 1

## 2013-12-13 NOTE — Progress Notes (Signed)
ANTICOAGULATION CONSULT NOTE - Follow Up Consult  Pharmacy Consult for Coumadin Indication: atrial fibrillation  Allergies  Allergen Reactions  . Advair Diskus [Fluticasone-Salmeterol]     Itching  . Biaxin [Clarithromycin]     N/V  . Ciprofloxacin     N/V  . Doxycycline Other (See Comments)    Inside of mouth and tongue red and peeled  . Keflex [Cephalexin]     N/V  . Ketek [Telithromycin]     N/V  . Levaquin [Levofloxacin In D5w]     N/V  . Macrobid [Nitrofurantoin]     N/V  . Penicillins     Rash  . Sulfonamide Derivatives     Patient Measurements: Height: 5\' 3"  (160 cm) Weight: 194 lb 11.2 oz (88.315 kg) (scale b) IBW/kg (Calculated) : 52.4 Heparin Dosing Weight:    Vital Signs: Temp: 97.6 F (36.4 C) (04/23 0900) Temp src: Oral (04/23 0900) BP: 148/57 mmHg (04/23 0900) Pulse Rate: 47 (04/23 0900)  Labs:  Recent Labs  12/11/13 1907 12/12/13 0540 12/13/13 0417 12/13/13 0852  HGB  --  10.7*  --   --   HCT  --  35.1*  --   --   PLT  --  161  --   --   LABPROT 13.3 13.5 14.8  --   INR 1.03 1.05 1.19  --   CREATININE  --  1.49* 1.50* 1.41*    Estimated Creatinine Clearance: 33.6 ml/min (by C-G formula based on Cr of 1.41).   Assessment:  dyspnea 80yo female admitted 4/15 w/ severe SOB, known to skip Lasix at times, improved w/ BiPAP.  Anticoagulation - Lovenox40 + warfarin for new dx afib. PLTC up, H/H low-stable. INR 1.19. CHADSVASC=4, high risk.  Infectious Disease: s/p 8d abx for PNA 4/14 BC X 1/2 CNS, negative 4/14 Urine Ecoli- pan sens MRSA PCR neg  Azithromycin 4/15>>4/20 Vanc 4/15>>4/17 CTX 4/15 >>4/21  Cardiovascular h/o CHF, PPM, ECHO EF 55-60% VSS, holding diuretics for now w/ elevated CR; ASA 81,  Endocrinology: DM , CBGs 83-232 no meds PTA, SSI, Lantus   GI / Nutrition: LFTs wnl, alb 3.4 on 4/15. Po PPI BID.  Nephrology- Scr 1.41  Pulm: COPD exaceration (5L O2 PTA): IV steroids & duonebs  Heme/Onc: Hgb 10.7. Plts  161  PTA Med Issues- lasix, singulair, prednisone, nexium  Best Practices- lovenox 40 + warfarin   Goal of Therapy:  INR 2-3 Monitor platelets by anticoagulation protocol: Yes   Plan:  Repeat Coumadin 5mg  po x 1 today. Lovenox 40mg /day until INR>2.    [name], PharmD, [facility] Clinical Staff Pharmacist Pager [phone]  [facility] [name] Merilynn [name] 12/13/2013,10:27 AM

## 2013-12-13 NOTE — Discharge Summary (Addendum)
Physician Discharge Summary  [name] ZOX:[id number] DOB: Aug 10, 1932 DOA: 12/04/2013  PCP: Nadean Corwin, MD   Admit date: 12/04/2013 Discharge date: 12/13/2013  Time spent: Greater than 30 minutes  Recommendations for Outpatient Follow-up:  1. Dr. Lucky Cowboy, PCP in 3 days with repeat labs (CBC, BMP, PT and INR) 2. Home health PT and RN 3. Home health RN to draw labs (CBC, BMP, PT and INR) on 12/17/2013 and forward results to patient's PCP for Coumadin dose adjustment. 4. Dr. Sharrell Ku, Cardiology on 01/07/14 at 10:45 AM  Discharge Diagnoses:  Active Problems:   Congestive heart failure, unspecified   PACEMAKER-St.Jude   HTN (hypertension)   COPD (chronic obstructive pulmonary disease)   DM (diabetes mellitus)   Acute on chronic respiratory failure with hypoxia   NSTEMI (non-ST elevated myocardial infarction)   PNA (pneumonia)   Discharge Condition: Improved & Stable  Diet recommendation: Heart healthy and diabetic diet  Filed Weights   12/11/13 0506 12/12/13 0246 12/13/13 0341  Weight: 88.678 kg (195 lb 8 oz) 87.363 kg (192 lb 9.6 oz) 88.315 kg (194 lb 11.2 oz)    History of present illness:  80 year old female with history of chronic diastolic CHF, severe COPD-steroid dependent, chronic respiratory failure on 5 L per minute oxygen via nasal cannula, chronic kidney disease (baseline creatinine probably 1.4-1.6), AV block status post PPM, HLD, anxiety & depression, atrial flutter, HTN, DM 2, GERD was admitted to the hospital on 12/05/13 after her family was unable to reach her via phone and then she was found in her bed having difficulty breathing on room air. EMS was called and found her with oxygen saturations in the mid 70s, BiPAP was placed and she was brought to the ED. There was 3 days history of cough and worsening dyspnea but no fevers or swelling.  Hospital Course:   1. Acute on chronic hypoxic respiratory failure: Secondary to severe COPD with  exacerbation, community acquired pneumonia and acute on chronic diastolic CHF. Underlying conditions were treated. Patient received BiPAP when necessary initially. She has clinically improved and respiratory status is at baseline. Discussed at length with patient's daughter yesterday and recommended considering outpatient hospice when she follows up with her PCP. Patient apparently had similar presentation during hospitalization in 2002 when she required intubation. 2. Severe COPD with exacerbation: Patient was treated with BiPAP when necessary, oxygen, bronchodilator nebulizations, IV antibiotics and tapering steroids. Exacerbation has resolved. We'll discharge her on PTA dose of oral prednisone and oxygen. 3. Community acquired pneumonia-RUL and RLL: Completed one-week treatment with IV Rocephin and 5 days course of azithromycin. Followup chest x-ray in the next 4-6 weeks to ensure resolution. 4. Acute on chronic diastolic CHF: 2-D echo in 2012 revealed mild Onalee Hua allegation with preserved LV function and no evidence of diastolic dysfunction. Repeat echo showed preserved LV function but inadequate to eval for diastolic dysfunction. She was initially treated with IV Lasix which was temporarily held secondary to mildly rising creatinine. Her CHF is now compensated. We'll resume home dose of Lasix at discharge. 5. NSTEMI, type II: Initial point-of-care troponin was 0.37 but subsequent troponins have been normal. Echo is unremarkable. Likely do to demand ischemia from problem #1. 6. Uncontrolled type II DM: Hemoglobin A1c: 7.1. In the hospital her blood sugars rose secondary to steroids and acute illness. She was placed on Lantus, NovoLog mealtime and SSI. As the steroid dose has been tapered, or CBGs have also been decreasing. Reluctant to discharge patient on maintenance hypoglycemic agent secondary  to risk for hypoglycemia in the context of chronic renal disease. Hence will DC without medications except  diabetic diet. Monitor closely as outpatient and may consider medications during PCP followup. 7. Bradycardia status post Status post pacemaker: 2-D echo reported paradoxical movement of ventricular septum and hence EPS cardiology consulted and did not advice making any changes to the pacemaker at this time. 8. Escherichia coli UTI: Completed treatment with one week course of IV Rocephin. 9. Recurrent A. fib with controlled ventricular rate: As per EPS cardiology, A. fib his newly diagnosed. Her chads2-Vasc score is 5. There is no prior history of CVA/TIA. No history of bleeding, bleeding disorder, nosebleeds, blood in urine or stools. As discussed with daughter no history of falls. As per cardiology recommendations, started Coumadin which can be adjusted outpatient as deemed necessary.  10. Thrombocytopenia: Attributed to acute illness and infection. Resolved 11. 1 of 2 blood cultures positive for coagulase-negative Staphylococcus: Likely contaminant. 12.  Dehydration/intravascular volume depletion: This may become a recurrent issue secondary to poor oral intake. Encouraged adequate hydration. Resolved.   Consultations:  Cardiology   EPS cardiology  Procedures:   BiPAP     Discharge Exam:  Complaints:   patient denies complaints. States that her breathing is at baseline. Denies cough or chest pain. Anxious to go home.   Filed Vitals:   12/13/13 0341 12/13/13 0857 12/13/13 0900 12/13/13 1329  BP: 169/77  148/57 133/74  Pulse: 58  47 49  Temp: 97.5 F (36.4 C)  97.6 F (36.4 C) 97.6 F (36.4 C)  TempSrc: Oral  Oral Oral  Resp: 18  22 20   Height:      Weight: 88.315 kg (194 lb 11.2 oz)     SpO2: 92% 92% 96% 94%    General exam:  pleasant elderly female lying comfortably in bed  Respiratory system: distant breath sounds but seems clear to auscultation . No increased work of breathing. Cardiovascular system: S1 & S2 heard, RRR. No JVD, murmurs, gallops, clicks or pedal edema.  telemetry: Atrial fibrillation with ventricular rate mostly in the 50s.  Gastrointestinal system: Abdomen is nondistended, soft and nontender. Normal bowel sounds heard. Central nervous system: Alert and oriented. No focal neurological deficits. Extremities: Symmetric 5 x 5 power.  Discharge Instructions      Discharge Orders   Future Appointments Provider Department Dept Phone   12/17/2013 3:45 PM [name] [address] [facility] [phone]   01/07/2014 10:45 AM Marinus MawGregg W Taylor, MD [facility] [address] [facility] [phone]   03/14/2014 11:00 AM Lucky CowboyWilliam McKeown, MD [facility] [phone]   Future Orders Complete By Expires   (HEART FAILURE PATIENTS) Call MD:  Anytime you have any of the following symptoms: 1) 3 pound weight gain in 24 hours or 5 pounds in 1 week 2) shortness of breath, with or without a dry hacking cough 3) swelling in the hands, feet or stomach 4) if you have to sleep on extra pillows at night in order to breathe.  As directed    Call MD for:  difficulty breathing, headache or visual disturbances  As directed    Diet - low sodium heart healthy  As directed    Diet Carb Modified  As directed    Discharge instructions  As directed    Increase activity slowly  As directed        Medication List    STOP taking these medications       azithromycin 250 MG  tablet  Commonly known as:  ZITHROMAX      TAKE these medications       aspirin 81 MG EC tablet  Take 81 mg by mouth daily.     esomeprazole 40 MG capsule  Commonly known as:  NEXIUM  Take 40 mg by mouth daily before breakfast.     furosemide 40 MG tablet  Commonly known as:  LASIX  Take 40 mg by mouth daily.     montelukast 10 MG tablet  Commonly known as:  SINGULAIR  Take 10 mg by mouth daily.     predniSONE 10 MG tablet  Commonly known as:  DELTASONE  Take 10 mg by mouth daily with breakfast.     tiotropium 18 MCG inhalation capsule   Commonly known as:  SPIRIVA  Place 18 mcg into inhaler and inhale daily.     VENTOLIN HFA 108 (90 BASE) MCG/ACT inhaler  Generic drug:  albuterol  Inhale 2 puffs into the lungs every 6 (six) hours as needed. For shortness of breath.     vitamin B-12 1000 MCG tablet  Commonly known as:  CYANOCOBALAMIN  Take 1,000 mcg by mouth daily.     Vitamin D 2000 UNITS tablet  Take 5,000 Units by mouth daily.     warfarin 5 MG tablet  Commonly known as:  COUMADIN  Take 1 tablet (5 mg total) by mouth daily at 6 PM.       Follow-up Information   [facility] [address] [facility] On 12/17/2013. (At 3:45 PM for Coumadin check / follow-up)    Specialty:  Cardiology   Contact information:   [address], [address] [place] [address] [phone]      Follow up with Lewayne BuntingGregg Taylor, MD On 01/07/2014. (At 10:45 AM)    Specialty:  Cardiology   Contact information:   [address] [place] [address] [phone]       Follow up with [facility]. (RN/PT)    Contact information:   [address] [place] [address] [phone]        The results of significant diagnostics from this hospitalization (including imaging, microbiology, ancillary and laboratory) are listed below for reference.    Significant Diagnostic Studies: Dg Chest Port 1 View  12/09/2013   CLINICAL DATA:  Shortness of breath.  Dyspnea on exertion.  EXAM: PORTABLE CHEST - 1 VIEW  COMPARISON:  Chest x-ray 12/04/2013.  FINDINGS: There is cephalization of the pulmonary vasculature and slight indistinctness of the interstitial markings suggestive of mild pulmonary edema. Focal areas of linear architectural distortion in the right upper lung, right lower lung and left midlung, similar to prior studies, favored to represent areas of post infectious or inflammatory scarring. No definite consolidative airspace disease. Limited study which fails to demonstrate the left  lung base. No definite pleural effusions. Heart size is mildly enlarged. The patient is rotated to the right on today's exam, resulting in distortion of the mediastinal contours and reduced diagnostic sensitivity and specificity for mediastinal pathology. Atherosclerosis in the thoracic aorta. Left-sided biventricular pacemaker with lead tips projecting over the expected location of the right atrium, right ventricular apex and overlying the anterior wall of the left ventricle via the coronary sinus and coronary veins.  IMPRESSION: 1. Appearance of the chest again suggests mild congestive heart failure. 2. Chronic bilateral scarring related to prior episodes of infection or inflammation. 3. Atherosclerosis.   Electronically Signed  By: Trudie Reed M.D.   On: 12/09/2013 12:41   Dg Chest Port 1 View  12/04/2013   CLINICAL DATA:  Respiratory distress.  EXAM: PORTABLE CHEST - 1 VIEW  COMPARISON:  CT CHEST W/O CM dated 08/16/2011; DG CHEST 1V PORT dated 08/14/2011  FINDINGS: Advanced COPD again noted. There may be subtle infiltrates in the right upper lobe and right lower lobe. Parenchymal scarring is present bilaterally. No edema, pneumothorax or pleural effusion is identified. The heart size is normal. There is a stable appearance to a biventricular pacer.  IMPRESSION: Advanced COPD with potential infiltrates in the right upper lobe and right lower lobe.   Electronically Signed   By: Irish Lack M.D.   On: 12/04/2013 23:44    Microbiology: Recent Results (from the past 240 hour(s))  URINE CULTURE     Status: None   Collection Time    12/04/13 10:48 PM      Result Value Ref Range Status   Specimen Description URINE, CATHETERIZED   Final   Special Requests NONE   Final   Culture  Setup Time     Final   Value: 12/05/2013 00:04     Performed at [facility] Count     Final   Value: >=100,000 COLONIES/ML     Performed at [facility]   Culture     Final   Value:  ESCHERICHIA COLI     Performed at [facility]   Report Status 12/07/2013 FINAL   Final   Organism ID, Bacteria ESCHERICHIA COLI   Final  CULTURE, BLOOD (ROUTINE X 2)     Status: None   Collection Time    12/04/13 10:58 PM      Result Value Ref Range Status   Specimen Description BLOOD BLOOD RIGHT FOREARM   Final   Special Requests     Final   Value: BOTTLES DRAWN AEROBIC AND ANAEROBIC 7CC AEROBIC 3CC ANAEROBIC   Culture  Setup Time     Final   Value: 12/05/2013 04:57     Performed at [facility]   Culture     Final   Value: NO GROWTH 5 DAYS     Performed at [facility]   Report Status 12/11/2013 FINAL   Final  CULTURE, BLOOD (ROUTINE X 2)     Status: None   Collection Time    12/04/13 10:58 PM      Result Value Ref Range Status   Specimen Description BLOOD BLOOD LEFT FOREARM   Final   Special Requests BOTTLES DRAWN AEROBIC AND ANAEROBIC 10CC EACH   Final   Culture  Setup Time     Final   Value: 12/05/2013 04:57     Performed at [facility]   Culture     Final   Value: STAPHYLOCOCCUS SPECIES (COAGULASE NEGATIVE)     Note: THE SIGNIFICANCE OF ISOLATING THIS ORGANISM FROM A SINGLE SET OF BLOOD CULTURES WHEN MULTIPLE SETS ARE DRAWN IS UNCERTAIN. PLEASE NOTIFY THE MICROBIOLOGY DEPARTMENT WITHIN ONE WEEK IF SPECIATION AND SENSITIVITIES ARE REQUIRED.     Note: Gram Stain Report Called to,Read Back By and Verified With: LEE WORLEY 12/06/13 1242A FULKC     Performed at [facility]   Report Status 12/07/2013 FINAL   Final  MRSA PCR SCREENING     Status: None   Collection Time    12/05/13  6:30 AM      Result Value Ref Range  Status   MRSA by PCR NEGATIVE  NEGATIVE Final   Comment:            The GeneXpert MRSA Assay (FDA     approved for NASAL specimens     only), is one component of a     comprehensive MRSA colonization     surveillance program. It is not     intended to diagnose MRSA     infection nor to guide or     monitor treatment  for     MRSA infections.     Labs: Basic Metabolic Panel:  Recent Labs Lab 12/08/13 0420 12/10/13 0229 12/12/13 0540 12/13/13 0417 12/13/13 0852  NA 143 141 147 143 142  K 4.6 4.7 4.3 5.5* 4.4  CL 104 105 107 105 103  CO2 26 23 27 28 28   GLUCOSE 308* 285* 114* 142* 108*  BUN 50* 42* 31* 29* 27*  CREATININE 1.50* 1.43* 1.49* 1.50* 1.41*  CALCIUM 9.2 9.1 8.9 8.6 8.8   Liver Function Tests: No results found for this basename: AST, ALT, ALKPHOS, BILITOT, PROT, ALBUMIN,  in the last 168 hours No results found for this basename: LIPASE, AMYLASE,  in the last 168 hours No results found for this basename: AMMONIA,  in the last 168 hours CBC:  Recent Labs Lab 12/07/13 0905 12/10/13 0229 12/12/13 0540  WBC 12.8* 16.3* 11.9*  HGB 11.3* 10.7* 10.7*  HCT 36.6 34.1* 35.1*  MCV 98.4 98.3 99.2  PLT 154 174 161   Cardiac Enzymes: No results found for this basename: CKTOTAL, CKMB, CKMBINDEX, TROPONINI,  in the last 168 hours BNP: BNP (last 3 results)  Recent Labs  12/04/13 2258  PROBNP 17479.0*   CBG:  Recent Labs Lab 12/12/13 1143 12/12/13 1617 12/12/13 2125 12/13/13 0549 12/13/13 1120  GLUCAP 135* 83 232* 120* 71    Additional labs: 1.  ABG on admission: PH 7.362, PCO2 56, PO2 of 137, bicarbonate 31 and oxygen saturation 99% 2. INR 4/23:1.19 3. Hemoglobin A1c 4/15:7.1 4. 2-D echo 12/05/13: Study Conclusions  - Left ventricle: The cavity size was normal. Wall thickness was normal. Systolic function was normal. The estimated ejection fraction was in the range of 55% to 60%. Wall motion was normal; there were no regional wall motion abnormalities. The study is not technically sufficient to allow evaluation of LV diastolic function. - Ventricular septum: Septal motion showed paradox.  Discussed with patient's daughter Ms. [name] Dalton: Updated care and answered questions. They are able to arrange for 24/7 supervision & assistance.  Signed:  Elease Etienne,  MD, FACP, [name]. [name] Pager [phone]  If 7PM-7AM, please contact night-coverage [email or web address] Password [name] 12/13/2013, 3:01 PM

## 2013-12-13 NOTE — Progress Notes (Signed)
NUTRITION FOLLOW UP  Intervention:   No interventions warranted at this time. RD to continue to monitor.  Nutrition Dx:   Increased nutrient needs related to COPD as evidenced by estimated needs; ongoing  Goal:   Pt to meet >/= 90% of their estimated nutrition needs; met  Monitor:   weight trends, lab trends, I/O's, PO intake  Assessment:   PMHx significant for CHF, COPD (on 5 liters O2), CKD. Admitted with SOB. Work-up reveals acute on chronic respiratory failure.  4/16: Pt's CXR is consistent with PNA. Currently ordered for Carbohydrate Modified Medium diet. Intake is adequate, pt is consuming 75% of meals. Pt states that she is eating well. States that she doesn't want any supplements, confirms that her weight has been stable.  4/23: Pt asleep at time of visit. Per nursing note, pt is consuming 80-100% of most meals. Pt's weight has trended up with 6 lb weight gain in the past week.   Labs: glucose 83 to 298, elevated BUN and creatinine, low GFR I/O's: +602 ml fluid balance  Height: Ht Readings from Last 1 Encounters:  12/08/13 $RemoveB'5\' 3"'viQtAGCg$  (1.6 m)    Weight Status:   Wt Readings from Last 1 Encounters:  12/13/13 194 lb 11.2 oz (88.315 kg)    Re-estimated needs:  Kcal: 1500 - 1700  Protein: 90 - 100 g  Fluid: 1.5 - 1.7 liters daily  Skin: WDL  Diet Order: Carb Control   Intake/Output Summary (Last 24 hours) at 12/13/13 1244 Last data filed at 12/13/13 6812  Gross per 24 hour  Intake   1320 ml  Output    600 ml  Net    720 ml    Last BM: 4/22   Labs:   Recent Labs Lab 12/12/13 0540 12/13/13 0417 12/13/13 0852  NA 147 143 142  K 4.3 5.5* 4.4  CL 107 105 103  CO2 $Re'27 28 28  'QIJ$ BUN 31* 29* 27*  CREATININE 1.49* 1.50* 1.41*  CALCIUM 8.9 8.6 8.8  GLUCOSE 114* 142* 108*    CBG (last 3)   Recent Labs  12/12/13 2125 12/13/13 0549 12/13/13 1120  GLUCAP 232* 120* 71    Scheduled Meds: . albuterol  2.5 mg Nebulization QID  . aspirin EC  81 mg Oral Daily   . enoxaparin (LOVENOX) injection  40 mg Subcutaneous Q24H  . insulin aspart  0-9 Units Subcutaneous TID WC  . insulin aspart  8 Units Subcutaneous TID WC  . insulin glargine  16 Units Subcutaneous QHS  . montelukast  10 mg Oral Daily  . pantoprazole  40 mg Oral BID  . predniSONE  10 mg Oral BID WC  . tiotropium  18 mcg Inhalation Daily  . warfarin  5 mg Oral ONCE-1800  . Warfarin - Pharmacist Dosing Inpatient   Does not apply q1800    Continuous Infusions:   Pryor Ochoa RD, LDN Inpatient Clinical Dietitian Pager: [phone] After Hours Pager: [phone]

## 2013-12-13 NOTE — Progress Notes (Signed)
All d/c instructions explained and given to pt via charge nurse Dorann LodgeJuanita, RN.  Pt d/c off floor At 1718.   [name] PeaNellie Rasheem Figiel, Charity fundraiserN.

## 2013-12-14 DIAGNOSIS — I4891 Unspecified atrial fibrillation: Secondary | ICD-10-CM

## 2013-12-14 DIAGNOSIS — I509 Heart failure, unspecified: Secondary | ICD-10-CM

## 2013-12-14 DIAGNOSIS — J45901 Unspecified asthma with (acute) exacerbation: Secondary | ICD-10-CM

## 2013-12-14 DIAGNOSIS — E119 Type 2 diabetes mellitus without complications: Secondary | ICD-10-CM

## 2013-12-14 DIAGNOSIS — J441 Chronic obstructive pulmonary disease with (acute) exacerbation: Secondary | ICD-10-CM

## 2013-12-17 ENCOUNTER — Ambulatory Visit (INDEPENDENT_AMBULATORY_CARE_PROVIDER_SITE_OTHER): Payer: Medicare Other | Admitting: Cardiology

## 2013-12-17 ENCOUNTER — Telehealth: Payer: Self-pay | Admitting: *Deleted

## 2013-12-17 DIAGNOSIS — I4891 Unspecified atrial fibrillation: Secondary | ICD-10-CM

## 2013-12-17 LAB — PROTIME-INR: INR: 4.6 — AB (ref 0.9–1.1)

## 2013-12-17 LAB — POCT INR: INR: 5.5

## 2013-12-17 NOTE — Telephone Encounter (Signed)
Daughter called and states patient on Coumadin 5 mg since hospital admission.  INR was 5.5 toaday and asked advise.  Patient wants to have protimes done here and has hospital follow up here on 12/19/2013. Per Dr Oneta RackMcKeown, leave med off until 12/19/2013 OV here.  Daughter aware.

## 2013-12-19 ENCOUNTER — Ambulatory Visit (INDEPENDENT_AMBULATORY_CARE_PROVIDER_SITE_OTHER): Payer: Medicare Other | Admitting: Internal Medicine

## 2013-12-19 ENCOUNTER — Encounter: Payer: Self-pay | Admitting: Internal Medicine

## 2013-12-19 VITALS — BP 122/64 | HR 56 | Temp 97.7°F | Resp 20 | Ht 63.0 in | Wt 181.7 lb

## 2013-12-19 DIAGNOSIS — Z7901 Long term (current) use of anticoagulants: Secondary | ICD-10-CM

## 2013-12-19 DIAGNOSIS — I4891 Unspecified atrial fibrillation: Secondary | ICD-10-CM

## 2013-12-19 DIAGNOSIS — I10 Essential (primary) hypertension: Secondary | ICD-10-CM

## 2013-12-19 DIAGNOSIS — N3000 Acute cystitis without hematuria: Secondary | ICD-10-CM

## 2013-12-19 DIAGNOSIS — J449 Chronic obstructive pulmonary disease, unspecified: Secondary | ICD-10-CM

## 2013-12-19 DIAGNOSIS — J209 Acute bronchitis, unspecified: Secondary | ICD-10-CM

## 2013-12-19 LAB — PROTIME-INR
INR: 2.5 — ABNORMAL HIGH (ref <1.50)
Prothrombin Time: 26.4 seconds — ABNORMAL HIGH (ref 11.6–15.2)

## 2013-12-19 NOTE — Progress Notes (Signed)
Subjective:    Patient ID: [name], female    DOB: Jun 26, 1932, 80 y.o.   MRN: [id number]  HPI Patient was hospitalized APR 14- 23 w/ suspected RLL/RUL PNA , CHF, and acute on chronic Respiratory failure and pAfib (Chads-2-v=5) and was treated with ABx's and started on coumadin. (has a grandniece Geophysicist/field seismologist- Crystal -  staying with her) INR 2 days ago w/o reported bleeding and patient was advised to withhold coumadin til further notice. Currently patient is on 5 Lit O2 and Sat's are 90-94%, but quickly drop to 82-84 if O2 is stopped. Today O2 Sat at rest off O2 is 86% at rest.She has an intermittant non productive cough. Current Outpatient Prescriptions on File Prior to Visit  Medication Sig Dispense Refill  . albuterol (VENTOLIN HFA) 108 (90 BASE) MCG/ACT inhaler Inhale 2 puffs into the lungs every 6 (six) hours as needed. For shortness of breath.      Marland Kitchen. aspirin 81 MG EC tablet Take 81 mg by mouth daily.        . Cholecalciferol (VITAMIN D) 2000 UNITS tablet Take 5,000 Units by mouth daily.       . furosemide (LASIX) 40 MG tablet Take 40 mg by mouth daily.      . montelukast (SINGULAIR) 10 MG tablet Take 10 mg by mouth daily.        . predniSONE (DELTASONE) 10 MG tablet Take 10 mg by mouth daily with breakfast.      . tiotropium (SPIRIVA) 18 MCG inhalation capsule Place 18 mcg into inhaler and inhale daily.        . vitamin B-12 (CYANOCOBALAMIN) 1000 MCG tablet Take 1,000 mcg by mouth daily.      Marland Kitchen. warfarin (COUMADIN) 5 MG tablet Take 1 tablet (5 mg total) by mouth daily at 6 PM.  30 tablet  0   No current facility-administered medications on file prior to visit.   Allergies  Allergen Reactions  . Advair Diskus [Fluticasone-Salmeterol]     Itching  . Biaxin [Clarithromycin]     N/V  . Ciprofloxacin     N/V  . Doxycycline Other (See Comments)    Inside of mouth and tongue red and peeled  . Keflex [Cephalexin]     N/V  . Ketek [Telithromycin]     N/V  . Levaquin [Levofloxacin In D5w]      N/V  . Macrobid [Nitrofurantoin]     N/V  . Penicillins     Rash  . Sulfonamide Derivatives    Past Medical History  Diagnosis Date  . AV block, complete   . CHF (congestive heart failure)   . AAA (abdominal aortic aneurysm)   . Dyslipidemia   . History of colonic polyps   . Anxiety and depression   . Diverticulosis of colon   . Asthma   . Acute renal failure   . Atrial flutter   . Hyperglycemia   . UTI (lower urinary tract infection)   . COPD (chronic obstructive pulmonary disease)   . HTN (hypertension)   . DM (diabetes mellitus)   . GERD (gastroesophageal reflux disease)   . DVT (deep venous thrombosis)   . B12 deficiency   . Angiodysplasia of intestine (without mention of hemorrhage)    Review of Systems Systems review with occas dysuria and otherwise non contributory to above.  Objective:   Physical Exam  BP 122/64  Pulse 56  Temp(Src) 97.7 F (36.5 C) (Temporal)  Resp 20  Ht 5\' 3"  (1.6  m)  Wt 181 lb 11.2 oz (82.419 kg)  BMI 32.19 kg/m2  HEENT - Eac's patent. TM's Nl.EOM's full. PERRLA. NasoOroPharynx clear. Neck - supple. Nl Thyroid. No bruits nodes JVD Chest - Barrel chested and kyphotic. distant BS w/ scattered rales & rhonchi and expiratory wheezes Cor - distant HS. RRR w/o sig MGR. PP 1(+) No edema. MS- FROM. w/o deformities. Muscle power tone and bulk decreased. In Wheel chair. Neuro - No obvious Cr N abnormalities. Sensory, motor and Cerebellar functions appear Nl w/o focal abnormalities.  Assessment & Plan:   1. HTN (hypertension)   2. COPD (chronic obstructive pulmonary disease) - Sputum to be brought in  3. pAtrial fibrillation - Protime-INR  4. Dysuria - U/A to be brought in

## 2013-12-19 NOTE — Patient Instructions (Signed)
Warfarin: What You Need to Know  Warfarin is an anticoagulant. Anticoagulants help prevent the formation of blood clots. They also help stop the growth of blood clots. Warfarin is sometimes referred to as a "blood thinner."   Normally, when body tissues are cut or damaged, the blood clots in order to prevent blood loss. Sometimes clots form inside your blood vessels and obstruct the flow of blood through your circulatory system (thrombosis). These clots may travel through your bloodstream and become lodged in smaller blood vessels in your brain, which can cause a stroke, or your lungs (pulmonary embolism).  WHO SHOULD USE WARFARIN?  Warfarin is prescribed for people at risk of developing harmful blood clots:  · People with surgically implanted mechanical heart valves, irregular heart rhythms called atrial fibrillation, and certain clotting disorders.  · People who have developed harmful blood clotting in the past, including those who have had a stroke or a pulmonary embolism, or thrombosis in their legs (deep vein thrombosis [DVT]).  · People with an existing blood clot such as a pulmonary embolism.  WARFARIN DOSING  Warfarin tablets come in different strengths. Each tablet strength is a different color, with the amount of warfarin (in milligrams) clearly printed on the tablet. If the color of your tablet is different than usual when you receive a new prescription, report it immediately to your pharmacist or health care provider.  WARFARIN MONITORING  The goal of warfarin therapy is to lessen the clotting tendency of blood but not to prevent clotting completely. Your health care provider will monitor the anticoagulation effect of warfarin closely and adjust your dose as needed. For your safety, blood tests called prothrombin time (PT) or international normalized ratio (INR) are used to measure the effects of warfarin. Both of these tests can be done with a finger stick or a blood draw.  The longer it takes the blood  to clot, the higher the PT or INR. Your health care provider will inform you of your "target" PT or INR range. If, at any time, your PT or INR is above the target range, there is a risk of bleeding. If your PT or INR is below the target range, there is a risk of clotting.  Whether you are started on warfarin while you are in the hospital, or in your health care provider's office, you will need to have your PT or INR checked within one week of starting the medicine. Initially, some people are asked to have their PT or INR checked as much as twice a week.  Once you are on a stable maintenance dose, the PT or INR is checked less often, usually once every 2 to 4 weeks. The warfarin dose may be adjusted if the PT or INR is not within the target range. It is important to keep all laboratory and health care provider follow-up appointments.   WHAT ARE THE SIDE EFFECTS OF WARFARIN?  · Too much warfarin can cause bleeding (hemorrhage) from any part of the body. This may include bleeding from the gums, blood in the urine, bloody or dark stools, a nosebleed that is not easily stopped, coughing up blood, or vomiting blood.  · Too little warfarin can increase the risk of blood clots.  · Too little or too much warfarin can also increase the risk of a stroke.  · Warfarin use may cause a skin rash or irritation, an unusual fever, continual nausea or stomach upset, or severe pain in your joints or back.  SPECIAL PRECAUTIONS   WHILE TAKING WARFARIN  Warfarin should be taken exactly as directed:  · Take your medicine at the same time every day. If you forget to take your dose, you can take it if it is within 6 hours of when it was due.  · Do not change the dose of warfarin on your own to make up for missed or extra doses.  · If you miss more than 2 doses in a row, you should contact your health care provider for advice.  Avoid situations that cause bleeding. You may have a tendency to bleed more easily than usual while taking warfarin.  The following actions can limit bleeding:  · Using a softer toothbrush.  · Flossing with waxed floss rather than unwaxed floss.  · Shaving with an electric razor rather than a blade.  · Limiting the use of sharp objects.  · Avoiding potentially harmful activities such as contact sports.  Warfarin and Pregnancy or Breastfeeding  · Warfarin is not advised during the first trimester of pregnancy due to an increased risk of birth defects. In certain situations, a woman may take warfarin after her first trimester of pregnancy. A woman who becomes pregnant or plans to become pregnant while taking warfarin should notify her health care provider immediately.  · Although warfarin does not pass into breast milk, a woman who wishes to breastfeed while taking warfarin should also consult with her health care provider.  Alcohol, Smoking, and Illicit Drug Use  · Alcohol affects how warfarin works in the body. It is best to avoid alcoholic drinks or consume very small amounts while taking warfarin. In general, alcohol intake should be limited to 1 oz (30 mL) of liquor, 6 oz (180 mL) of wine, or 12 oz (360 mL) of beer each day. Notify your health care provider if you change your alcohol intake.  · Smoking affects how warfarin works. It is best to avoid smoking while taking warfarin. Notify your health care provider if you change your smoking habits.  · It is best to avoid all illicit drugs while taking warfarin since there are few studies that show how warfarin interacts with these drugs.  Other Medicines and Dietary Supplements  Many prescription and over-the-counter medicines can interfere with warfarin. Be sure all of your health care providers know you are taking warfarin. Notify your health care provider who prescribed warfarin for you before starting or stopping any new medicines, including over-the-counter vitamins, dietary supplements, and pain medicines. Your warfarin dose may need to be adjusted.  Some common  over-the-counter medicines that may increase the risk of bleeding while taking warfarin include:   · Acetaminophen.  · Aspirin.  · Nonsteroidal anti-inflammatory medicines such as ibuprofen or naproxen.  · Vitamin E.  Dietary Considerations   Foods that have moderate or high amounts of vitamin K can interfere with warfarin. Avoid major changes in your diet or notify your health care provider before changing your diet. Eat a consistent amount of foods that have moderate or high amounts of vitamin K.Eating less foods containing vitamin K can increase the risk of bleeding. Eating more foods containing vitamin K can increase the risk of blood clots. Additional questions about dietary considerations can be discussed with a dietitian.  The serving size for foods containing moderate or high amounts of vitamin K are ½ cup cooked (120 mL or noted gram weight) or 1 cup raw (240 mL or noted gram weight), unless otherwise noted. These foods include:  Proteins  · Beef liver,   3.5 oz (100 g).  · Pork liver, 3.5 oz (100 g).  Legumes  · Soybean oil.  · Soybeans.  · Garbanzo beans.  · Green peas.  · Black-eyed peas.  Leafy green vegetables  · Kale.  · Spinach.  · Nettle greens.  · Swiss chard.  · Watercress.  · Endive.  · Parsley, 1 tbsp (4 g).  · Turnip greens.  · Collard greens.  · Seaweed, limit 2 sheets.  · Beet greens.  · Dandelion greens.  · Mustard greens.  · Green Lead and Romaine lettuce.  Cruciferous vegetables  · Broccoli.  · Cabbage (green or Chinese).  · Brussels sprouts.  · Cauliflower.  · Asparagus.  Miscellaneous  · Onions, green onions, or spring onions.  · Green tea made with ½ oz (14 g) or more of dried tea.  · Herbal teas containing coumarin.  · Spinach noodles.  · Okra.  · Prunes.  · Pickles.  CALL YOUR CLINIC OR HEALTH CARE PROVIDER IF YOU:  · Plan to have any surgery or procedure.  · Feel sick, especially if you have diarrhea or vomiting.  · Experience or anticipate any major changes in your diet.  · Start or  stop a prescription or over-the-counter medicine.  · Become, plan to become, or think you may be pregnant.  · Are having heavier than usual menstrual periods.  · Have had a fall, accident, or any symptoms of bleeding or unusual bruising.  · An unusual fever.  CALL 911 IN THE [place] OR GO TO THE EMERGENCY DEPARTMENT IF YOU:   · Think you may be having an allergic reaction to warfarin. The signs of an allergic reaction could include itching, rash, hives, swelling, chest tightness, or trouble breathing.  · See signs of blood in your urine. The signs could include reddish, pinkish, or tea-colored urine.  · See signs of blood in your stools. The signs could include bright red or black stools.  · Vomit or cough up blood. In these instances, the blood could have either a bright red or a "coffee-grounds" appearance.  · Have bleeding that will not stop after applying pressure for 30 minutes such as cuts, nosebleeds, other injuries.  · Have severe pain in your joints or back.  · Have a new and severe headache.  · Have sudden weakness or numbness of your face, arm, or leg, especially on one side of your body.  · Have sudden confusion or trouble understanding.  · Have sudden trouble seeing in one or both eyes.  · Have sudden trouble walking, dizziness, loss of balance, or coordination.  · Have aphasia.  Document Released: 08/09/2005 Document Revised: 05/03/2012 Document Reviewed: 02/02/2013  ExitCare® Patient Information ©2014 [employer].

## 2013-12-20 ENCOUNTER — Telehealth: Payer: Self-pay | Admitting: *Deleted

## 2013-12-20 NOTE — Telephone Encounter (Signed)
Glade StanfordEricia from Dr Lubertha Basqueaylor's office called and states patient's PT/INR needs to be followed by Dr Ladona Ridgelaylor until after upcoming cardioversion. Dr Oneta RackMcKeown aware.

## 2013-12-21 ENCOUNTER — Other Ambulatory Visit: Payer: Self-pay | Admitting: Internal Medicine

## 2013-12-21 ENCOUNTER — Telehealth: Payer: Self-pay | Admitting: Nurse Practitioner

## 2013-12-21 ENCOUNTER — Other Ambulatory Visit: Payer: Self-pay | Admitting: Physician Assistant

## 2013-12-21 DIAGNOSIS — J209 Acute bronchitis, unspecified: Secondary | ICD-10-CM

## 2013-12-21 DIAGNOSIS — N3000 Acute cystitis without hematuria: Secondary | ICD-10-CM

## 2013-12-21 NOTE — Addendum Note (Signed)
Addended by: Lucky CowboyMCKEOWN, Oluwatoyin Banales on: 12/21/2013 01:47 PM   Modules accepted: Orders

## 2013-12-21 NOTE — Telephone Encounter (Signed)
Received call from RN with [facility].  [name] had an INR drawn by primary care on 4/27, which was 5.5.  She was told to hold her coumadin and was seen in primary care office on 4/29 and INR was 2.5.  Apparently our office contacted PCP's office to say that we would begin managing INR's and orders were given to advance for pt to start taking coumadin 2.5 qpm with f/u INR today.  Resumption of coumadin was not communicated to patient and as a result she has been off of coumadin since the 27th.  Further, INR was never drawn today. I rec that she resume 2.5mg  as previously Rx with f/u INR on Monday.  RN will contact pt.

## 2013-12-22 LAB — URINALYSIS, ROUTINE W REFLEX MICROSCOPIC
Bilirubin Urine: NEGATIVE
GLUCOSE, UA: NEGATIVE mg/dL
Hgb urine dipstick: NEGATIVE
KETONES UR: NEGATIVE mg/dL
Leukocytes, UA: NEGATIVE
Nitrite: POSITIVE — AB
PH: 5.5 (ref 5.0–8.0)
Protein, ur: NEGATIVE mg/dL
SPECIFIC GRAVITY, URINE: 1.02 (ref 1.005–1.030)
UROBILINOGEN UA: 0.2 mg/dL (ref 0.0–1.0)

## 2013-12-22 LAB — URINALYSIS, MICROSCOPIC ONLY
BACTERIA UA: NONE SEEN
CASTS: NONE SEEN
SQUAMOUS EPITHELIAL / LPF: NONE SEEN

## 2013-12-23 LAB — URINE CULTURE: Colony Count: 95000

## 2013-12-24 ENCOUNTER — Ambulatory Visit (INDEPENDENT_AMBULATORY_CARE_PROVIDER_SITE_OTHER): Payer: Medicare Other | Admitting: Cardiovascular Disease

## 2013-12-24 ENCOUNTER — Other Ambulatory Visit: Payer: Self-pay | Admitting: Internal Medicine

## 2013-12-24 DIAGNOSIS — I4891 Unspecified atrial fibrillation: Secondary | ICD-10-CM

## 2013-12-24 LAB — RESPIRATORY CULTURE OR RESPIRATORY AND SPUTUM CULTURE: ORGANISM ID, BACTERIA: NORMAL

## 2013-12-24 LAB — POCT INR: INR: 1.6

## 2013-12-24 MED ORDER — PROCHLORPERAZINE MALEATE 10 MG PO TABS: ORAL_TABLET | ORAL | Status: DC

## 2013-12-24 MED ORDER — CIPROFLOXACIN HCL 500 MG PO TABS: ORAL_TABLET | ORAL | Status: DC

## 2013-12-28 ENCOUNTER — Ambulatory Visit (INDEPENDENT_AMBULATORY_CARE_PROVIDER_SITE_OTHER): Payer: Medicare Other | Admitting: Internal Medicine

## 2013-12-28 DIAGNOSIS — I4891 Unspecified atrial fibrillation: Secondary | ICD-10-CM

## 2013-12-28 LAB — POCT INR: INR: 2.6

## 2013-12-31 ENCOUNTER — Ambulatory Visit (INDEPENDENT_AMBULATORY_CARE_PROVIDER_SITE_OTHER): Payer: Medicare Other | Admitting: Cardiology

## 2013-12-31 ENCOUNTER — Encounter: Payer: Self-pay | Admitting: Internal Medicine

## 2013-12-31 ENCOUNTER — Ambulatory Visit (INDEPENDENT_AMBULATORY_CARE_PROVIDER_SITE_OTHER): Payer: Medicare Other | Admitting: Internal Medicine

## 2013-12-31 VITALS — BP 138/60 | HR 56 | Temp 98.0°F | Resp 18 | Ht 63.0 in

## 2013-12-31 DIAGNOSIS — J209 Acute bronchitis, unspecified: Secondary | ICD-10-CM

## 2013-12-31 DIAGNOSIS — N3000 Acute cystitis without hematuria: Secondary | ICD-10-CM

## 2013-12-31 DIAGNOSIS — Z79899 Other long term (current) drug therapy: Secondary | ICD-10-CM | POA: Insufficient documentation

## 2013-12-31 DIAGNOSIS — I4891 Unspecified atrial fibrillation: Secondary | ICD-10-CM

## 2013-12-31 DIAGNOSIS — J449 Chronic obstructive pulmonary disease, unspecified: Secondary | ICD-10-CM

## 2013-12-31 LAB — POCT INR: INR: 2.7

## 2013-12-31 NOTE — Patient Instructions (Addendum)
Chronic Obstructive Pulmonary Disease Chronic obstructive pulmonary disease (COPD) is a common lung problem. In COPD, the flow of air from the lungs is limited. The way your lungs work will probably never return to normal, but there are things you can do to improve you lungs and make yourself feel better. HOME CARE  Take all medicines as told by your doctor.  Only take over-the-counter or prescription medicines as told by your doctor.  Avoid medicines or cough syrups that dry up your airway (such as antihistamines) and do not allow you to get rid of thick spit. You do not need to avoid them if told differently by your doctor.  If you smoke, stop. Smoking makes the problem worse.  Avoid being around things that make your breathing worse (like smoke, chemicals, and fumes).  Use oxygen therapy and therapy to help improve your lungs (pulmonary rehabilitation) if told by your doctor. If you need home oxygen therapy, ask your doctor if you should buy a tool to measure your oxygen level (oximeter).  Avoid people who have a sickness you can catch (contagious).  Avoid going outside when it is very hot, cold, or humid.  Eat healthy foods. Eat smaller meals more often. Rest before meals.  Stay active, but remember to also rest.  Make sure to get all the shots (vaccines) your doctor recommends. Ask your doctor if you need a pneumonia shot.  Learn and use tips on how to relax.  Learn and use tips on how to control your breathing as told by your doctor. Try:  Breathing in (inhaling) through your nose for 1 second. Then, pucker your lips and breath out (exhale) through your lips for 2 seconds.  Putting one hand on your belly (abdomen). Breathe in slowly through your nose for 1 second. Your hand on your belly should move out. Pucker your lips and breathe out slowly through your lips. Your hand on your belly should move in as you breathe out.  Learn and use controlled coughing to clear thick spit  from your lungs. 1. Lean your head a little forward. 2. Breathe in deeply. 3. Try to hold your breath for 3 seconds. 4. Keep your mouth slightly open while coughing 2 times. 5. Spit any thick spit out into a tissue. 6. Rest and do the steps again 1 or 2 times as needed. GET HELP IF:  You cough up more thick spit than usual.  There is a change in the color or thickness of the spit.  It is harder to breathe than usual.  Your breathing is faster than usual. GET HELP RIGHT AWAY IF:   You have shortness of breath while resting.  You have shortness of breath that stops you from:  Being able to talk.  Doing normal activities.  You chest hurts for longer than 5 minutes.  Your skin color is more blue than usual.  Your pulse oximeter shows that you have low oxygen for longer than 5 minutes. MAKE SURE YOU:   Understand these instructions.  Will watch your condition.  Will get help right away if you are not doing well or get worse. Document Released: 01/26/2008 Document Revised: 05/30/2013 Document Reviewed: 04/05/2013 ExitCare Patient Information 2014 [employer].  

## 2013-12-31 NOTE — Progress Notes (Signed)
Subjective:     Patient ID: [name], female   DOB: 02-13-1932, 80 y.o.   MRN: [id number]  HPI  80 [facility]4/23[facility] with CAP / AECB.. Patient also has Afib and is followed by Dr Clifton JamesMcAlhany.Recently patient was een about 12 days ago in F/U of hospitalization with AECB and was still congested and on hi flow O2 at 4-5 LPM. Culture of sputum grew Normal Flora & U/C grew Pseudomonas Aeruginosa and she was treated with Cipro. She returns tod ay for F/U feeling much improved & breathing better. Initial O2 sat measured by the nurse was 86% and was rechecked by myself at at 77% while she was talking & mouth breathing and denied any "Air Hunger" and appeared in no distress.  Medication Sig  . albuterol (VENTOLIN HFA) 108 (90 BASE) MCG/ACT inhaler Inhale 2 puffs into the lungs every 6 (six) hours as needed. For shortness of breath.  Marland Kitchen. aspirin 81 MG EC tablet Take 81 mg by mouth daily.    . Cholecalciferol (VITAMIN D) 2000 UNITS tablet Take 5,000 Units by mouth daily.   . ciprofloxacin (CIPRO) 500 MG tablet Take 1 tablet 2 x daily on a full stomach  . dexlansoprazole (DEXILANT) 60 MG capsule Take 60 mg by mouth daily.  . furosemide (LASIX) 40 MG tablet Take 40 mg by mouth daily.  . montelukast (SINGULAIR) 10 MG tablet Take 10 mg by mouth daily.    Marland Kitchen. PARoxetine (PAXIL) 20 MG tablet   . potassium chloride SA (K-DUR) 20 MEQ tablet   . predniSONE (DELTASONE) 10 MG tablet Take 10 mg by mouth daily with breakfast.  . prochlorperazine (COMPAZINE) 10 MG tablet Take 1 tablet 2 x daily  About 1 hour before Cipro/ciprofloxacin to prevent nausea  . tiotropium (SPIRIVA) 18 MCG inhalation capsule Place 18 mcg into inhaler and inhale daily.    .  Take 1,000 mcg by mouth daily.  Marland Kitchen. warfarin (COUMADIN) 5 MG tablet Take 1 tablet (5 mg total) by mouth daily at 6 PM.   Allergies  Allergen Reactions  . Advair Diskus [Fluticasone-Salmeterol]     Itching  . Biaxin [Clarithromycin]     N/V  .  Ciprofloxacin     N/V  . Doxycycline Other (See Comments)    Inside of mouth and tongue red and peeled  . Keflex [Cephalexin]     N/V  . Ketek [Telithromycin]     N/V  . Levaquin [Levofloxacin In D5w]     N/V  . Macrobid [Nitrofurantoin]     N/V  . Penicillins     Rash  . Sulfonamide Derivatives    Past Medical History  Diagnosis Date  . AV block, complete   . CHF (congestive heart failure)   . AAA (abdominal aortic aneurysm)   . Dyslipidemia   . History of colonic polyps   . Anxiety and depression   . Diverticulosis of colon   . Asthma   . Acute renal failure   . Atrial flutter   . Hyperglycemia   . UTI (lower urinary tract infection)   . COPD (chronic obstructive pulmonary disease)   . HTN (hypertension)   . DM (diabetes mellitus)   . GERD (gastroesophageal reflux disease)   . DVT (deep venous thrombosis)   . B12 deficiency   . Angiodysplasia of intestine (without mention of hemorrhage)    Review of Systems In addition to the HPI above,  No Fever-chills,  No Headache, No changes with Vision or hearing,  No problems swallowing food or Liquids,  No Chest pain, (+)  productive Cough,  No Abdominal pain, No Nausea or Vomitting, Bowel movements are regular,  No Blood in stool or Urine,  No dysuria,  No new skin rashes or bruises,  No new joints pains-aches,  No new weakness, tingling, numbness in any extremity,  No recent weight loss,  No polyuria, polydypsia or polyphagia,  No significant Mental Stressors.  A full 10 point Review of Systems was done, except as stated above, all other Review of Systems were negative      Objective:   Physical Exam BP 138/60  Pulse 56  Temp(Src) 98 F (36.7 C) (Temporal)  Resp 18  Ht 5\' 3"  (1.6 m)  SpO2 86%  HEENT - Eac's patent. TM's Nl.EOM's full. PERRLA. NasoOroPharynx clear.  Neck - supple. Nl Thyroid. No bruits nodes JVD  Chest - Barrel chested and kyphotic. BS very distant w/ few scattered rales , but no rhonchi  and expiratory wheezes today. No apparent respiratory distress.  Cor - distant HS. RRR w/o sig MGR. PP 1(+) No edema.  MS- FROM. w/o deformities. Muscle power tone and bulk decreased. In Wheel chair.  Neuro - No obvious Cr N abnormalities. Sensory, motor and Cerebellar functions appear Nl w/o focal abnormalities.   Assessment:   1. Hypertension  2. Hyperlipidemia  3. 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 Encounter for long-term (current) use of other medications  5. Vitamin D Deficiency  6. Long term (current) use of anticoagulants  7. End Stage COPD    Plan:    Continue current meds and return as previously scheduled.

## 2014-01-01 LAB — URINE CULTURE: Colony Count: 5000

## 2014-01-01 LAB — URINALYSIS, MICROSCOPIC ONLY
Bacteria, UA: NONE SEEN
Casts: NONE SEEN
Crystals: NONE SEEN
RBC / HPF: NONE SEEN RBC/hpf
WBC, UA: NONE SEEN WBC/hpf

## 2014-01-03 ENCOUNTER — Other Ambulatory Visit: Payer: Self-pay

## 2014-01-04 ENCOUNTER — Ambulatory Visit (INDEPENDENT_AMBULATORY_CARE_PROVIDER_SITE_OTHER): Payer: Medicare Other | Admitting: Cardiology

## 2014-01-04 ENCOUNTER — Telehealth: Payer: Self-pay | Admitting: Internal Medicine

## 2014-01-04 DIAGNOSIS — I4891 Unspecified atrial fibrillation: Secondary | ICD-10-CM

## 2014-01-04 LAB — POCT INR: INR: 4.2

## 2014-01-04 NOTE — Telephone Encounter (Signed)
[facility] and with pt and coumadin encounter done

## 2014-01-04 NOTE — Telephone Encounter (Signed)
New message    PT--50.2; INR 4.2. Please advise

## 2014-01-07 ENCOUNTER — Ambulatory Visit (INDEPENDENT_AMBULATORY_CARE_PROVIDER_SITE_OTHER): Payer: Medicare Other | Admitting: *Deleted

## 2014-01-07 ENCOUNTER — Ambulatory Visit (INDEPENDENT_AMBULATORY_CARE_PROVIDER_SITE_OTHER): Payer: Medicare Other | Admitting: Internal Medicine

## 2014-01-07 ENCOUNTER — Encounter: Payer: Self-pay | Admitting: Internal Medicine

## 2014-01-07 VITALS — BP 173/74 | HR 55 | Ht 63.0 in | Wt 184.0 lb

## 2014-01-07 DIAGNOSIS — Z950 Presence of cardiac pacemaker: Secondary | ICD-10-CM

## 2014-01-07 DIAGNOSIS — I509 Heart failure, unspecified: Secondary | ICD-10-CM

## 2014-01-07 DIAGNOSIS — I5032 Chronic diastolic (congestive) heart failure: Secondary | ICD-10-CM

## 2014-01-07 DIAGNOSIS — I4891 Unspecified atrial fibrillation: Secondary | ICD-10-CM

## 2014-01-07 DIAGNOSIS — I442 Atrioventricular block, complete: Secondary | ICD-10-CM

## 2014-01-07 LAB — MDC_IDC_ENUM_SESS_TYPE_INCLINIC
Date Time Interrogation Session: 20150518114302
Implantable Pulse Generator Model: 8042
Lead Channel Impedance Value: 567 Ohm
Lead Channel Pacing Threshold Amplitude: 0.5 V
Lead Channel Pacing Threshold Pulse Width: 0.4 ms
Lead Channel Pacing Threshold Pulse Width: 0.4 ms
Lead Channel Sensing Intrinsic Amplitude: 11.2 mV
Lead Channel Sensing Intrinsic Amplitude: 11.2 mV
Lead Channel Setting Pacing Amplitude: 2.5 V
Lead Channel Setting Pacing Pulse Width: 0.4 ms
Lead Channel Setting Sensing Sensitivity: 2.8 mV
MDC IDC MSMT BATTERY REMAINING LONGEVITY: 58 mo
MDC IDC MSMT BATTERY VOLTAGE: 2.97 V
MDC IDC MSMT LEADCHNL LV IMPEDANCE VALUE: 0 Ohm
MDC IDC MSMT LEADCHNL RA IMPEDANCE VALUE: 409 Ohm
MDC IDC MSMT LEADCHNL RA PACING THRESHOLD AMPLITUDE: 0.5 V
MDC IDC STAT BRADY RV PERCENT PACED: 93 %

## 2014-01-07 LAB — POCT INR: INR: 2.9

## 2014-01-07 MED ORDER — FUROSEMIDE 40 MG PO TABS: ORAL_TABLET | ORAL | Status: DC

## 2014-01-07 NOTE — Assessment & Plan Note (Signed)
Her [employer] DDD PM is working normally except for the LV lead which is off due to diaphragmatic stimulation. . [facility] the patient's request in 6 months.

## 2014-01-07 NOTE — Assessment & Plan Note (Signed)
We discussed the various treatment options. She is not a candidate for amio and is not interested in going into the hospital for 3 days to start Tikosyn. She does not have palpitations. She will continue her systemic anti-coagulation.

## 2014-01-07 NOTE — Progress Notes (Signed)
HPI [name] returns today for followup. She is a pleasant and 80 year old woman with a history of chronic systolic heart failure, complete heart block, hypertension, and COPD on home oxygen. In the interim, she has been stable but does note a chronic cough and increased shortness of breath, but no peripheral edema. No syncope. Her left ventricular lead was turned off secondary to diaphragmatic stimulation. Now she notes that with any exertion, her breath gets short and she has to stop what she is doing. She admits to dietary indiscretion with sodium and her weight is up 3-4 lbs. [place] [employer] [place] yesterday. No other complaints. Allergies  Allergen Reactions  . Advair Diskus [Fluticasone-Salmeterol]     Itching  . Biaxin [Clarithromycin]     N/V  . Ciprofloxacin     N/V  . Doxycycline Other (See Comments)    Inside of mouth and tongue red and peeled  . Keflex [Cephalexin]     N/V  . Ketek [Telithromycin]     N/V  . Levaquin [Levofloxacin In D5w]     N/V  . Macrobid [Nitrofurantoin]     N/V  . Penicillins     Rash  . Sulfonamide Derivatives      Current Outpatient Prescriptions  Medication Sig Dispense Refill  . albuterol (VENTOLIN HFA) 108 (90 BASE) MCG/ACT inhaler Inhale 2 puffs into the lungs every 6 (six) hours as needed. For shortness of breath.      Marland Kitchen. aspirin 81 MG EC tablet Take 81 mg by mouth daily.        . Cholecalciferol (VITAMIN D) 2000 UNITS tablet Take 5,000 Units by mouth daily.       Marland Kitchen. dexlansoprazole (DEXILANT) 60 MG capsule Take 60 mg by mouth daily.      . furosemide (LASIX) 40 MG tablet Take 40 mg by mouth daily.      . montelukast (SINGULAIR) 10 MG tablet Take 10 mg by mouth daily.        Marland Kitchen. PARoxetine (PAXIL) 20 MG tablet       . predniSONE (DELTASONE) 10 MG tablet Take 10 mg by mouth daily with breakfast.      . prochlorperazine (COMPAZINE) 10 MG tablet Take 1 tablet 2 x daily  About 1 hour before Cipro/ciprofloxacin to prevent nausea  20  tablet  1  . tiotropium (SPIRIVA) 18 MCG inhalation capsule Place 18 mcg into inhaler and inhale daily.        . vitamin B-12 (CYANOCOBALAMIN) 1000 MCG tablet Take 1,000 mcg by mouth daily.      Marland Kitchen. warfarin (COUMADIN) 5 MG tablet Take 1 tablet (5 mg total) by mouth daily at 6 PM.  30 tablet  0   No current facility-administered medications for this visit.     Past Medical History  Diagnosis Date  . AV block, complete   . CHF (congestive heart failure)   . AAA (abdominal aortic aneurysm)   . Dyslipidemia   . History of colonic polyps   . Anxiety and depression   . Diverticulosis of colon   . Asthma   . Acute renal failure   . Atrial flutter   . Hyperglycemia   . UTI (lower urinary tract infection)   . COPD (chronic obstructive pulmonary disease)   . HTN (hypertension)   . DM (diabetes mellitus)   . GERD (gastroesophageal reflux disease)   . DVT (deep venous thrombosis)   . B12 deficiency   . Angiodysplasia of intestine (without mention of hemorrhage)  ROS:   All systems reviewed and negative except as noted in the HPI.   Past Surgical History  Procedure Laterality Date  . Tubal ligation    . Abdominal hysterectomy    . Appendectomy    . Pacemaker insertion      medtronic   . Colonoscopy  02/06/2002    403.47,[phone],562.10  . Bladder suspension       Family History  Problem Relation Age of Onset  . Emphysema Maternal Uncle     multiple   . Asthma Daughter   . Colon cancer Sister     alive at 3480   . Heart disease Father   . CVA Mother   . Heart disease Mother      History   Social History  . Marital Status: Widowed    Spouse Name: N/A    Number of Children: N/A  . Years of Education: N/A   Occupational History  . Not on file.   Social History Main Topics  . Smoking status: Former Smoker    Quit date: 08/23/1988  . Smokeless tobacco: Never Used     Comment: quit in 1990, smoked 1 ppd x 30 years   . Alcohol Use: No  . Drug Use: No  . Sexual  Activity: Not on file   Other Topics Concern  . Not on file   Social History Narrative   Married, 4 children; retired Associate Professorretail worker.      BP 173/74  Pulse 55  Ht 5\' 3"  (1.6 m)  Wt 184 lb (83.462 kg)  BMI 32.60 kg/m2  Physical Exam:  Stable but chronically ill appearing 80 year old woman, NAD HEENT: Unremarkable Neck:  7 cm JVD, no thyromegally Lungs:  Scattered rales in the bases with no wheezes HEART:  Regular rate rhythm, no murmurs, no rubs, no clicks Abd:  soft, positive bowel sounds, no organomegally, no rebound, no guarding Ext:  2 plus pulses, no edema, no cyanosis, no clubbing Skin:  No rashes no nodules Neuro:  CN II through XII intact, motor grossly intact  DEVICE  Normal device function.  See PaceArt for details. Left ventricular lead remains turned off. Underlying rhythm is atrial fib with a controlled VR.  Assess/Plan:

## 2014-01-07 NOTE — Assessment & Plan Note (Signed)
Her symptoms are class 2-3 despite medical therapy. She would likely improve with NSR but she is not willing to take Tikosyn at this time. She is encouraged to reduce her sodium indiscretion. She will take extra diruetic as needed.

## 2014-01-07 NOTE — Patient Instructions (Signed)
Your physician wants you to follow-up in: 6 months with Dr Johney [place]red in [place] You will receive a reminder letter in the mail two months in advance. If you don't receive a letter, please call our office to schedule the follow-up appointment.   Take as extra Furosemide if weight is over 181

## 2014-01-08 ENCOUNTER — Encounter: Payer: Self-pay | Admitting: Internal Medicine

## 2014-01-08 ENCOUNTER — Telehealth: Payer: Self-pay | Admitting: Internal Medicine

## 2014-01-08 NOTE — Telephone Encounter (Signed)
Returned call to pt's daughter who helps her mother with her medications, advised pt should be taking 1/2 tablet everyday.  Daughter verbalized understanding and will clarify instruction sheet with pt.

## 2014-01-08 NOTE — Telephone Encounter (Signed)
New message      Need to know dosage of how much coumadin pt is to take

## 2014-01-15 ENCOUNTER — Ambulatory Visit (INDEPENDENT_AMBULATORY_CARE_PROVIDER_SITE_OTHER): Payer: Medicare Other | Admitting: *Deleted

## 2014-01-15 DIAGNOSIS — I4891 Unspecified atrial fibrillation: Secondary | ICD-10-CM

## 2014-01-15 LAB — POCT INR: INR: 2.4

## 2014-01-26 ENCOUNTER — Other Ambulatory Visit: Payer: Self-pay | Admitting: Internal Medicine

## 2014-01-26 ENCOUNTER — Other Ambulatory Visit (HOSPITAL_COMMUNITY): Payer: Self-pay | Admitting: Internal Medicine

## 2014-01-29 ENCOUNTER — Ambulatory Visit (INDEPENDENT_AMBULATORY_CARE_PROVIDER_SITE_OTHER): Payer: Medicare Other | Admitting: *Deleted

## 2014-01-29 DIAGNOSIS — I4891 Unspecified atrial fibrillation: Secondary | ICD-10-CM

## 2014-01-29 LAB — POCT INR: INR: 2.7

## 2014-01-30 ENCOUNTER — Other Ambulatory Visit: Payer: Self-pay | Admitting: *Deleted

## 2014-01-30 MED ORDER — WARFARIN SODIUM 5 MG PO TABS: 5.0000 mg | ORAL_TABLET | Freq: Every day | ORAL | Status: DC

## 2014-02-01 ENCOUNTER — Other Ambulatory Visit: Payer: Self-pay | Admitting: *Deleted

## 2014-02-01 MED ORDER — WARFARIN SODIUM 5 MG PO TABS: ORAL_TABLET | ORAL | Status: DC

## 2014-02-05 ENCOUNTER — Ambulatory Visit: Payer: Self-pay | Admitting: Internal Medicine

## 2014-02-14 ENCOUNTER — Other Ambulatory Visit: Payer: Self-pay

## 2014-02-14 MED ORDER — ALBUTEROL SULFATE HFA 108 (90 BASE) MCG/ACT IN AERS
2.0000 | INHALATION_SPRAY | Freq: Four times a day (QID) | RESPIRATORY_TRACT | Status: DC | PRN
Start: 2014-02-14 — End: 2014-08-09

## 2014-02-19 ENCOUNTER — Ambulatory Visit (INDEPENDENT_AMBULATORY_CARE_PROVIDER_SITE_OTHER): Payer: Medicare Other | Admitting: *Deleted

## 2014-02-19 DIAGNOSIS — I4891 Unspecified atrial fibrillation: Secondary | ICD-10-CM

## 2014-02-19 LAB — POCT INR: INR: 2.2

## 2014-02-24 ENCOUNTER — Other Ambulatory Visit: Payer: Self-pay | Admitting: Emergency Medicine

## 2014-03-14 ENCOUNTER — Encounter: Payer: Self-pay | Admitting: Internal Medicine

## 2014-03-14 ENCOUNTER — Ambulatory Visit (INDEPENDENT_AMBULATORY_CARE_PROVIDER_SITE_OTHER): Payer: Medicare Other | Admitting: Internal Medicine

## 2014-03-14 VITALS — BP 162/88 | HR 86 | Temp 98.2°F | Resp 18 | Ht 63.0 in | Wt 188.0 lb

## 2014-03-14 DIAGNOSIS — Z1212 Encounter for screening for malignant neoplasm of rectum: Secondary | ICD-10-CM

## 2014-03-14 DIAGNOSIS — I10 Essential (primary) hypertension: Secondary | ICD-10-CM

## 2014-03-14 DIAGNOSIS — Z1331 Encounter for screening for depression: Secondary | ICD-10-CM

## 2014-03-14 DIAGNOSIS — E559 Vitamin D deficiency, unspecified: Secondary | ICD-10-CM

## 2014-03-14 DIAGNOSIS — Z0000 Encounter for general adult medical examination without abnormal findings: Secondary | ICD-10-CM

## 2014-03-14 DIAGNOSIS — Z79899 Other long term (current) drug therapy: Secondary | ICD-10-CM

## 2014-03-14 DIAGNOSIS — E1129 Type 2 diabetes mellitus with other diabetic kidney complication: Secondary | ICD-10-CM

## 2014-03-14 DIAGNOSIS — E782 Mixed hyperlipidemia: Secondary | ICD-10-CM

## 2014-03-14 DIAGNOSIS — Z789 Other specified health status: Secondary | ICD-10-CM

## 2014-03-14 LAB — CBC WITH DIFFERENTIAL/PLATELET
Basophils Absolute: 0 10*3/uL (ref 0.0–0.1)
Basophils Relative: 0 % (ref 0–1)
Eosinophils Absolute: 0.2 10*3/uL (ref 0.0–0.7)
Eosinophils Relative: 2 % (ref 0–5)
HCT: 37.7 % (ref 36.0–46.0)
HEMOGLOBIN: 12 g/dL (ref 12.0–15.0)
LYMPHS ABS: 2.6 10*3/uL (ref 0.7–4.0)
Lymphocytes Relative: 29 % (ref 12–46)
MCH: 29.6 pg (ref 26.0–34.0)
MCHC: 31.8 g/dL (ref 30.0–36.0)
MCV: 92.9 fL (ref 78.0–100.0)
Monocytes Absolute: 0.7 10*3/uL (ref 0.1–1.0)
Monocytes Relative: 8 % (ref 3–12)
NEUTROS ABS: 5.6 10*3/uL (ref 1.7–7.7)
NEUTROS PCT: 61 % (ref 43–77)
PLATELETS: 155 10*3/uL (ref 150–400)
RBC: 4.06 MIL/uL (ref 3.87–5.11)
RDW: 15.1 % (ref 11.5–15.5)
WBC: 9.1 10*3/uL (ref 4.0–10.5)

## 2014-03-14 LAB — HEMOGLOBIN A1C
HEMOGLOBIN A1C: 7.4 % — AB (ref <5.7)
Mean Plasma Glucose: 166 mg/dL — ABNORMAL HIGH (ref <117)

## 2014-03-14 MED ORDER — METFORMIN HCL ER 500 MG PO TB24: ORAL_TABLET | ORAL | Status: DC

## 2014-03-14 NOTE — Progress Notes (Signed)
Patient ID: [name] MarvelGertrude S Blancett, female   DOB: [birth year], 80 y.o.   MRN: [id number]   Annual Screening Comprehensive Examination  This very nice 80 y.o.female presents for complete physical.  Patient has been followed for HTN, Diabetes  Prediabetes, Hyperlipidemia, and Vitamin D Deficiency.    Patient's more compromising problem is severe End Stage O2 Dependent COPD on 5 lit O2 at home with O2 sats running in the 80's and dropping to the 60's off O2. She has a chronic cough occasionally productive of scant amounts of sputum.    HTN predates since 1996 and ASHD with a Permanent Cardiac Pacemaker in 2009. Patient's BP has been controlled at home, but Today's BP is elevated 162/88 mmHg. Patient denies any cardiac symptoms as chest pain, palpitations, shortness of breath, dizziness or ankle swelling.   Patient's hyperlipidemia is controlled with diet and medications. Patient denies myalgias or other medication SE's. Last lipids were Chol 199, Trig 489, HDL 41 with LDL not calculated.   Patient has T2_NIDDM since 2007  w/Stage 2 CKD and last A1c was 7.1% in Apr 2015. Patient denies reactive hypoglycemic symptoms, visual blurring, diabetic polys, or paresthesias.    Finally, patient has history of Vitamin D Deficiency  Of 6 in 2008 and last Vitamin D was 10258 in Jan 2015.   Medication Sig  . albuterol  HFA  inhaler Inhale 2 puffs into the lungs every 6 (six) hours as needed  . aspirin 81 MG EC tablet Take 81 mg by mouth daily.    Marland Kitchen. VITAMIN D 2000 UNITS tablet Take 5,000 Units by mouth daily.   Marland Kitchen. DEXILANT 60 MG  Take 60 mg by mouth daily.  . furosemide ( 40 MG tablet Take daily and as needed for weight over 181  . montelukast  10 MG tablet TAKE ONE TABLET BY MOUTH ONE TIME DAILY  . PARoxetine (PAXIL) 20 MG tablet   . tiotropium (SPIRIVA)   inhale daily.    . vitamin B-12 1000 MCG tablet Take 1,000 mcg by mouth daily.  Marland Kitchen. warfarin  5 MG tablet Take 1/2 tablet daily or as directed by coumadin clinic     Allergies  Allergen Reactions  . Advair Diskus [Fluticasone-Salmeterol]     Itching  . Biaxin [Clarithromycin]     N/V  . Ciprofloxacin     N/V  . Doxycycline Other (See Comments)    Inside of mouth and tongue red and peeled  . Keflex [Cephalexin]     N/V  . Ketek [Telithromycin]     N/V  . Levaquin [Levofloxacin In D5w]     N/V  . Macrobid [Nitrofurantoin]     N/V  . Penicillins     Rash  . Sulfonamide Derivatives    Past Medical History  Diagnosis Date  . AV block, complete   . CHF (congestive heart failure)   . AAA (abdominal aortic aneurysm)   . Dyslipidemia   . History of colonic polyps   . Anxiety and depression   . Diverticulosis of colon   . Asthma   . Acute renal failure   . Atrial flutter   . Hyperglycemia   . UTI (lower urinary tract infection)   . COPD (chronic obstructive pulmonary disease)   . HTN (hypertension)   . DM (diabetes mellitus)   . GERD (gastroesophageal reflux disease)   . DVT (deep venous thrombosis)   . B12 deficiency   . Angiodysplasia of intestine (without mention of hemorrhage)    Past Surgical  History  Procedure Laterality Date  . Tubal ligation    . Abdominal hysterectomy    . Appendectomy    . Pacemaker insertion      medtronic   . Colonoscopy  02/06/2002    960.45,[phone]  . Bladder suspension     Family History  Problem Relation Age of Onset  . Emphysema Maternal Uncle     multiple   . Asthma Daughter   . Colon cancer Sister     alive at 60   . Heart disease Father   . CVA Mother   . Heart disease Mother    History  Substance Use Topics  . Smoking status: Former Smoker    Quit date: 08/23/1988  . Smokeless tobacco: Never Used     Comment: quit in 1990, smoked 1 ppd x 30 years   . Alcohol Use: No    ROS Constitutional: Denies fever, chills, weight loss/gain, headaches, insomnia, fatigue, night sweats, and change in appetite. Eyes: Denies redness, blurred vision, diplopia, discharge, itchy, watery eyes.   ENT: Denies discharge, congestion, post nasal drip, epistaxis, sore throat, earache, hearing loss, dental pain, Tinnitus, Vertigo, Sinus pain, snoring.  Cardio: Denies chest pain, palpitations, irregular heartbeat, syncope, dyspnea, diaphoresis, orthopnea, PND, claudication, edema Respiratory: as above.  Gastrointestinal: Denies dysphagia, heartburn, reflux, water brash, pain, cramps, nausea, vomiting, bloating, diarrhea, constipation, hematemesis, melena, hematochezia, jaundice, hemorrhoids Genitourinary: Denies dysuria, frequency, urgency, nocturia, hesitancy, discharge, hematuria, flank pain Breast: Breast lumps, nipple discharge, bleeding.  Musculoskeletal: Denies arthralgia, myalgia, stiffness, Jt. Swelling, pain, limp, and strain/sprain. Denies falls. Skin: Denies puritis, rash, hives, warts, acne, eczema, changing in skin lesion Neuro: No weakness, tremor, incoordination, spasms, paresthesia, pain Psychiatric: Denies confusion, memory loss, sensory loss. Denies Depression. Endocrine: Denies change in weight, skin, hair change, nocturia, and paresthesia, diabetic polys, visual blurring, hyper / hypo glycemic episodes.  Heme/Lymph: No excessive bleeding, bruising, enlarged lymph nodes.  Physical Exam  BP 162/88  Pulse 86  Temp(Src) 98.2 F (36.8 C) (Temporal)  Resp 18  Ht 5\' 3"  (1.6 m)  Wt 188 lb (85.276 kg)  BMI 33.31 kg/m2  General Appearance: Well nourished and in no apparent distress. Eyes: PERRLA, EOMs, conjunctiva no swelling or erythema, normal fundi and vessels. Sinuses: No frontal/maxillary tenderness ENT/Mouth: EACs patent / TMs  nl. Nares clear without erythema, swelling, mucoid exudates. Oral hygiene is good. No erythema, swelling, or exudate. Tongue normal, non-obstructing. Tonsils not swollen or erythematous. Hearing normal.  Neck: Supple, thyroid normal. No bruits, nodes or JVD. Respiratory: BS equal, very distant and clear bilateral without rales, rhonci, wheezing  or stridor. Cardio: Heart sounds are normal with regular rate and rhythm and no murmurs, rubs or gallops. Peripheral pulses are normal and equal bilaterally without edema. No aortic or femoral bruits. Chest: Kyphosis and barrel configured. Breasts: Symmetric, without lumps, nipple discharge, retractions, or fibrocystic changes.  Abdomen: Flat, soft, with bowl sounds. Nontender, no guarding, rebound, hernias, masses, or organomegaly.  Lymphatics: Non tender without lymphadenopathy.   Musculoskeletal: Full ROM all peripheral extremities, joint stability, 5/5 strength, and normal gait. Skin: Warm and dry without rashes, lesions, cyanosis, clubbing or  ecchymosis.  Neuro: Cranial nerves intact, reflexes equal bilaterally. Normal muscle tone, no cerebellar symptoms. Sensation intact.  Pysch: Awake and oriented X 3, normal affect, Insight and Judgment appropriate.   Assessment and Plan  1. Annual Screening Examination 2. Hypertension  3. Hyperlipidemia 4. Pre Diabetes 5. Vitamin D Deficiency 6. ASHD/Pacemaker 7. ES COPD, O2 dependent  Continue prudent  diet as discussed, weight control, BP monitoring, regular exercise, and medications. Discussed med's effects and SE's. Screening labs and tests as requested with regular follow-up as recommended.

## 2014-03-14 NOTE — Patient Instructions (Signed)
Recommend the book "The END of DIETING" by Dr Baker Janus   and the book "The END of DIABETES " by Dr Excell Seltzer  At [email or web address] - get book & Audio CD's      Being diabetic has a  300% increased risk for heart attack, stroke, cancer, and alzheimer- type vascular dementia. It is very important that you work harder with diet by avoiding all foods that are white except chicken & fish. Avoid white rice (brown & wild rice is OK), white potatoes (sweetpotatoes in moderation is OK), White bread or wheat bread or anything made out of white flour like bagels, donuts, rolls, buns, biscuits, cakes, pastries, cookies, pizza crust, and pasta (made from white flour & egg whites) - vegetarian pasta or spinach or wheat pasta is OK. Multigrain breads like Arnold's or [employer], or multigrain sandwich thins or flatbreads.  Diet, exercise and weight loss can reverse and cure diabetes in the early stages.  Diet, exercise and weight loss is very important in the control and prevention of complications of diabetes which affects every system in your body, ie. Brain - dementia/stroke, eyes - glaucoma/blindness, heart - heart attack/heart failure, kidneys - dialysis, stomach - gastric paralysis, intestines - malabsorption, nerves - severe painful neuritis, circulation - gangrene & loss of a leg(s), and finally cancer and Alzheimers.    I recommend avoid fried & greasy foods,  sweets/candy, white rice (brown or wild rice or Quinoa is OK), white potatoes (sweet potatoes are OK) - anything made from white flour - bagels, doughnuts, rolls, buns, biscuits,white and wheat breads, pizza crust and traditional pasta made of white flour & egg white(vegetarian pasta or spinach or wheat pasta is OK).  Multi-grain bread is OK - like multi-grain flat bread or sandwich thins. Avoid alcohol in excess. Exercise is also important.    Eat all the vegetables you want - avoid meat, especially red meat and dairy - especially cheese.  Cheese  is the most concentrated form of trans-fats which is the worst thing to clog up our arteries. Veggie cheese is OK which can be found in the fresh produce section at [employer] or [employer] or [employer]   Preventive Care for Adults A healthy lifestyle and preventive care can promote health and wellness. Preventive health guidelines for women include the following key practices.  A routine yearly physical is a good way to check with your health care provider about your health and preventive screening. It is a chance to share any concerns and updates on your health and to receive a thorough exam.  Visit your dentist for a routine exam and preventive care every 6 months. Brush your teeth twice a day and floss once a day. Good oral hygiene prevents tooth decay and gum disease.  The frequency of eye exams is based on your age, health, family medical history, use of contact lenses, and other factors. Follow your health care provider's recommendations for frequency of eye exams.  Eat a healthy diet. Foods like vegetables, fruits, whole grains, low-fat dairy products, and lean protein foods contain the nutrients you need without too many calories. Decrease your intake of foods high in solid fats, added sugars, and salt. Eat the right amount of calories for you.Get information about a proper diet from your health care provider, if necessary.  Regular physical exercise is one of the most important things you can do for your health. Most adults should get at least 150 minutes of moderate-intensity exercise (any activity that  your heart rate and causes you to sweat) each week. In addition, most adults need muscle-strengthening exercises on 2 or more days a week.  Maintain a healthy weight. The body mass index (BMI) is a screening tool to identify possible weight problems. It provides an estimate of body fat based on height and weight. Your health care provider can find your BMI and can help you  achieve or maintain a healthy weight.For adults 20 years and older:  A BMI below 18.5 is considered underweight.  A BMI of 18.5 to 24.9 is normal.  A BMI of 25 to 29.9 is considered overweight.  A BMI of 30 and above is considered obese.  Maintain normal blood lipids and cholesterol levels by exercising and minimizing your intake of saturated fat. Eat a balanced diet with plenty of fruit and vegetables. Blood tests for lipids and cholesterol should begin at age 20 and be repeated every 5 years. If your lipid or cholesterol levels are high, you are over 50, or you are at high risk for heart disease, you may need your cholesterol levels checked more frequently.Ongoing high lipid and cholesterol levels should be treated with medicines if diet and exercise are not working.  If you smoke, find out from your health care provider how to quit. If you do not use tobacco, do not start.  Lung cancer screening is recommended for adults aged 55-80 years who are at high risk for developing lung cancer because of a history of smoking. A yearly low-dose CT scan of the lungs is recommended for people who have at least a 30-pack-year history of smoking and are a current smoker or have quit within the past 15 years. A pack year of smoking is smoking an average of 1 pack of cigarettes a day for 1 year (for example: 1 pack a day for 30 years or 2 packs a day for 15 years). Yearly screening should continue until the smoker has stopped smoking for at least 15 years. Yearly screening should be stopped for people who develop a health problem that would prevent them from having lung cancer treatment.  If you are pregnant, do not drink alcohol. If you are breastfeeding, be very cautious about drinking alcohol. If you are not pregnant and choose to drink alcohol, do not have more than 1 drink per day. One drink is considered to be 12 ounces (355 mL) of beer, 5 ounces (148 mL) of wine, or 1.5 ounces (44 mL) of liquor.  Avoid  use of street drugs. Do not share needles with anyone. Ask for help if you need support or instructions about stopping the use of drugs.  High blood pressure causes heart disease and increases the risk of stroke. Your blood pressure should be checked at least every 1 to 2 years. Ongoing high blood pressure should be treated with medicines if weight loss and exercise do not work.  If you are 55-79 years old, ask your health care provider if you should take aspirin to prevent strokes.  Diabetes screening involves taking a blood sample to check your fasting blood sugar level. This should be done once every 3 years, after age 45, if you are within normal weight and without risk factors for diabetes. Testing should be considered at a younger age or be carried out more frequently if you are overweight and have at least 1 risk factor for diabetes.  Breast cancer screening is essential preventive care for women. You should practice "breast self-awareness." This means understanding the   the normal appearance and feel of your breasts and may include breast self-examination. Any changes detected, no matter how small, should be reported to a health care provider. Women in their 82s and 30s should have a clinical breast exam (CBE) by a health care provider as part of a regular health exam every 1 to 3 years. After age 64, women should have a CBE every year. Starting at age 24, women should consider having a mammogram (breast X-ray test) every year. Women who have a family history of breast cancer should talk to their health care provider about genetic screening. Women at a high risk of breast cancer should talk to their health care providers about having an MRI and a mammogram every year.  Breast cancer gene (BRCA)-related cancer risk assessment is recommended for women who have family members with BRCA-related cancers. BRCA-related cancers include breast, ovarian, tubal, and peritoneal cancers. Having family members with  these cancers may be associated with an increased risk for harmful changes (mutations) in the breast cancer genes BRCA1 and BRCA2. Results of the assessment will determine the need for genetic counseling and BRCA1 and BRCA2 testing.  Routine pelvic exams to screen for cancer are no longer recommended for nonpregnant women who are considered low risk for cancer of the pelvic organs (ovaries, uterus, and vagina) and who do not have symptoms. Ask your health care provider if a screening pelvic exam is right for you.  If you have had past treatment for cervical cancer or a condition that could lead to cancer, you need Pap tests and screening for cancer for at least 20 years after your treatment. If Pap tests have been discontinued, your risk factors (such as having a new sexual partner) need to be reassessed to determine if screening should be resumed. Some women have medical problems that increase the chance of getting cervical cancer. In these cases, your health care provider may recommend more frequent screening and Pap tests.  The HPV test is an additional test that may be used for cervical cancer screening. The HPV test looks for the virus that can cause the cell changes on the cervix. The cells collected during the Pap test can be tested for HPV. The HPV test could be used to screen women aged 77 years and older, and should be used in women of any age who have unclear Pap test results. After the age of 73, women should have HPV testing at the same frequency as a Pap test.  Colorectal cancer can be detected and often prevented. Most routine colorectal cancer screening begins at the age of 31 years and continues through age 34 years. However, your health care provider may recommend screening at an earlier age if you have risk factors for colon cancer. On a yearly basis, your health care provider may provide home test kits to check for hidden blood in the stool. Use of a small camera at the end of a tube, to  directly examine the colon (sigmoidoscopy or colonoscopy), can detect the earliest forms of colorectal cancer. Talk to your health care provider about this at age 62, when routine screening begins. Direct exam of the colon should be repeated every 5-10 years through age 16 years, unless early forms of pre-cancerous polyps or small growths are found.  People who are at an increased risk for hepatitis B should be screened for this virus. You are considered at high risk for hepatitis B if:  You were born in a country where hepatitis B  often. Talk with your health care provider about which countries are considered high risk.  Your parents were born in a high-risk country and you have not received a shot to protect against hepatitis B (hepatitis B vaccine).  You have HIV or AIDS.  You use needles to inject street drugs.  You live with, or have sex with, someone who has hepatitis B.  You get hemodialysis treatment.  You take certain medicines for conditions like cancer, organ transplantation, and autoimmune conditions.  Hepatitis C blood testing is recommended for all people born from 1945 through 1965 and any individual with known risks for hepatitis C.  Practice safe sex. Use condoms and avoid high-risk sexual practices to reduce the spread of sexually transmitted infections (STIs). STIs include gonorrhea, chlamydia, syphilis, trichomonas, herpes, HPV, and human immunodeficiency virus (HIV). Herpes, HIV, and HPV are viral illnesses that have no cure. They can result in disability, cancer, and death.  You should be screened for sexually transmitted illnesses (STIs) including gonorrhea and chlamydia if:  You are sexually active and are younger than 24 years.  You are older than 24 years and your health care provider tells you that you are at risk for this type of infection.  Your sexual activity has changed since you were last screened and you are at an increased risk for chlamydia or  gonorrhea. Ask your health care provider if you are at risk.  If you are at risk of being infected with HIV, it is recommended that you take a prescription medicine daily to prevent HIV infection. This is called preexposure prophylaxis (PrEP). You are considered at risk if:  You are a heterosexual woman, are sexually active, and are at increased risk for HIV infection.  You take drugs by injection.  You are sexually active with a partner who has HIV.  Talk with your health care provider about whether you are at high risk of being infected with HIV. If you choose to begin PrEP, you should first be tested for HIV. You should then be tested every 3 months for as long as you are taking PrEP.  Osteoporosis is a disease in which the bones lose minerals and strength with aging. This can result in serious bone fractures or breaks. The risk of osteoporosis can be identified using a bone density scan. Women ages 65 years and over and women at risk for fractures or osteoporosis should discuss screening with their health care providers. Ask your health care provider whether you should take a calcium supplement or vitamin D to reduce the rate of osteoporosis.  Menopause can be associated with physical symptoms and risks. Hormone replacement therapy is available to decrease symptoms and risks. You should talk to your health care provider about whether hormone replacement therapy is right for you.  Use sunscreen. Apply sunscreen liberally and repeatedly throughout the day. You should seek shade when your shadow is shorter than you. Protect yourself by wearing long sleeves, pants, a wide-brimmed hat, and sunglasses year round, whenever you are outdoors.  Once a month, do a whole body skin exam, using a mirror to look at the skin on your back. Tell your health care provider of new moles, moles that have irregular borders, moles that are larger than a pencil eraser, or moles that have changed in shape or  color.  Stay current with required vaccines (immunizations).  Influenza vaccine. All adults should be immunized every year.  Tetanus, diphtheria, and acellular pertussis (Td, Tdap) vaccine. Pregnant women should receive   receive 1 dose of Tdap vaccine during each pregnancy. The dose should be obtained regardless of the length of time since the last dose. Immunization is preferred during the 27th-36th week of gestation. An adult who has not previously received Tdap or who does not know her vaccine status should receive 1 dose of Tdap. This initial dose should be followed by tetanus and diphtheria toxoids (Td) booster doses every 10 years. Adults with an unknown or incomplete history of completing a 3-dose immunization series with Td-containing vaccines should begin or complete a primary immunization series including a Tdap dose. Adults should receive a Td booster every 10 years.  Varicella vaccine. An adult without evidence of immunity to varicella should receive 2 doses or a second dose if she has previously received 1 dose. Pregnant females who do not have evidence of immunity should receive the first dose after pregnancy. This first dose should be obtained before leaving the health care facility. The second dose should be obtained 4-8 weeks after the first dose.  Human papillomavirus (HPV) vaccine. Females aged 13-26 years who have not received the vaccine previously should obtain the 3-dose series. The vaccine is not recommended for use in pregnant females. However, pregnancy testing is not needed before receiving a dose. If a female is found to be pregnant after receiving a dose, no treatment is needed. In that case, the remaining doses should be delayed until after the pregnancy. Immunization is recommended for any person with an immunocompromised condition through the age of 61 years if she did not get any or all doses earlier. During the 3-dose series, the second dose should be obtained 4-8 weeks after the  first dose. The third dose should be obtained 24 weeks after the first dose and 16 weeks after the second dose.  Zoster vaccine. One dose is recommended for adults aged 44 years or older unless certain conditions are present.  Measles, mumps, and rubella (MMR) vaccine. Adults born before 55 generally are considered immune to measles and mumps. Adults born in [birth year] or later should have 1 or more doses of MMR vaccine unless there is a contraindication to the vaccine or there is laboratory evidence of immunity to each of the three diseases. A routine second dose of MMR vaccine should be obtained at least 28 days after the first dose for students attending postsecondary schools, health care workers, or international travelers. People who received inactivated measles vaccine or an unknown type of measles vaccine during 1963-1967 should receive 2 doses of MMR vaccine. People who received inactivated mumps vaccine or an unknown type of mumps vaccine before 1979 and are at high risk for mumps infection should consider immunization with 2 doses of MMR vaccine. For females of childbearing age, rubella immunity should be determined. If there is no evidence of immunity, females who are not pregnant should be vaccinated. If there is no evidence of immunity, females who are pregnant should delay immunization until after pregnancy. Unvaccinated health care workers born before [birth year] who lack laboratory evidence of measles, mumps, or rubella immunity or laboratory confirmation of disease should consider measles and mumps immunization with 2 doses of MMR vaccine or rubella immunization with 1 dose of MMR vaccine.  Pneumococcal 13-valent conjugate (PCV13) vaccine. When indicated, a person who is uncertain of her immunization history and has no record of immunization should receive the PCV13 vaccine. An adult aged 77 years or older who has certain medical conditions and has not been previously immunized should receive 1 dose of  PCV13 vaccine. This PCV13 should be followed with a dose of pneumococcal polysaccharide (PPSV23) vaccine. The PPSV23 vaccine dose should be obtained at least 8 weeks after the dose of PCV13 vaccine. An adult aged 66 years or older who has certain medical conditions and previously received 1 or more doses of PPSV23 vaccine should receive 1 dose of PCV13. The PCV13 vaccine dose should be obtained 1 or more years after the last PPSV23 vaccine dose.  Pneumococcal polysaccharide (PPSV23) vaccine. When PCV13 is also indicated, PCV13 should be obtained first. All adults aged 41 years and older should be immunized. An adult younger than age 20 years who has certain medical conditions should be immunized. Any person who resides in a nursing home or long-term care facility should be immunized. An adult smoker should be immunized. People with an immunocompromised condition and certain other conditions should receive both PCV13 and PPSV23 vaccines. People with human immunodeficiency virus (HIV) infection should be immunized as soon as possible after diagnosis. Immunization during chemotherapy or radiation therapy should be avoided. Routine use of PPSV23 vaccine is not recommended for American Indians, [place] Natives, or people younger than 65 years unless there are medical conditions that require PPSV23 vaccine. When indicated, people who have unknown immunization and have no record of immunization should receive PPSV23 vaccine. One-time revaccination 5 years after the first dose of PPSV23 is recommended for people aged 19-64 years who have chronic kidney failure, nephrotic syndrome, asplenia, or immunocompromised conditions. People who received 1-2 doses of PPSV23 before age 33 years should receive another dose of PPSV23 vaccine at age 19 years or later if at least 5 years have passed since the previous dose. Doses of PPSV23 are not needed for people immunized with PPSV23 at or after age 15 years.  Meningococcal vaccine.  Adults with asplenia or persistent complement component deficiencies should receive 2 doses of quadrivalent meningococcal conjugate (MenACWY-D) vaccine. The doses should be obtained at least 2 months apart. Microbiologists working with certain meningococcal bacteria, [place] recruits, people at risk during an outbreak, and people who travel to or live in countries with a high rate of meningitis should be immunized. A first-year college student up through age 52 years who is living in a residence hall should receive a dose if she did not receive a dose on or after her 16th birthday. Adults who have certain high-risk conditions should receive one or more doses of vaccine.  Hepatitis A vaccine. Adults who wish to be protected from this disease, have certain high-risk conditions, work with hepatitis A-infected animals, work in hepatitis A research labs, or travel to or work in countries with a high rate of hepatitis A should be immunized. Adults who were previously unvaccinated and who anticipate close contact with an international adoptee during the first 60 days after arrival in the [place] from a country with a high rate of hepatitis A should be immunized.  Hepatitis B vaccine. Adults who wish to be protected from this disease, have certain high-risk conditions, may be exposed to blood or other infectious body fluids, are household contacts or sex partners of hepatitis B positive people, are clients or workers in certain care facilities, or travel to or work in countries with a high rate of hepatitis B should be immunized.  Haemophilus influenzae type b (Hib) vaccine. A previously unvaccinated person with asplenia or sickle cell disease or having a scheduled splenectomy should receive 1 dose of Hib vaccine. Regardless of previous immunization, a recipient of a hematopoietic stem  hematopoietic stem cell transplant should receive a 3-dose series 6-12 months after her successful transplant. Hib vaccine is not recommended for  adults with HIV infection. Preventive Services / Frequency  Ages 65 years and over  Blood pressure check.** / Every 1 to 2 years.  Lipid and cholesterol check.** / Every 5 years beginning at age 20 years.  Lung cancer screening. / Every year if you are aged 55-80 years and have a 30-pack-year history of smoking and currently smoke or have quit within the past 15 years. Yearly screening is stopped once you have quit smoking for at least 15 years or develop a health problem that would prevent you from having lung cancer treatment.  Clinical breast exam.** / Every year after age 40 years.  BRCA-related cancer risk assessment.** / For women who have family members with a BRCA-related cancer (breast, ovarian, tubal, or peritoneal cancers).  Mammogram.** / Every year beginning at age 40 years and continuing for as long as you are in good health. Consult with your health care provider.  Pap test.** / Every 3 years starting at age 30 years through age 65 or 70 years with 3 consecutive normal Pap tests. Testing can be stopped between 65 and 70 years with 3 consecutive normal Pap tests and no abnormal Pap or HPV tests in the past 10 years.  HPV screening.** / Every 3 years from ages 30 years through ages 65 or 70 years with a history of 3 consecutive normal Pap tests. Testing can be stopped between 65 and 70 years with 3 consecutive normal Pap tests and no abnormal Pap or HPV tests in the past 10 years.  Fecal occult blood test (FOBT) of stool. / Every year beginning at age 50 years and continuing until age 75 years. You may not need to do this test if you get a colonoscopy every 10 years.  Flexible sigmoidoscopy or colonoscopy.** / Every 5 years for a flexible sigmoidoscopy or every 10 years for a colonoscopy beginning at age 50 years and continuing until age 75 years.  Hepatitis C blood test.** / For all people born from 1945 through 1965 and any individual with known risks for hepatitis  C.  Osteoporosis screening.** / A one-time screening for women ages 65 years and over and women at risk for fractures or osteoporosis.  Skin self-exam. / Monthly.  Influenza vaccine. / Every year.  Tetanus, diphtheria, and acellular pertussis (Tdap/Td) vaccine.** / 1 dose of Td every 10 years.  Varicella vaccine.** / Consult your health care provider.  Zoster vaccine.** / 1 dose for adults aged 60 years or older.  Pneumococcal 13-valent conjugate (PCV13) vaccine.** / Consult your health care provider.  Pneumococcal polysaccharide (PPSV23) vaccine.** / 1 dose for all adults aged 65 years and older.  Meningococcal vaccine.** / Consult your health care provider.  Hepatitis A vaccine.** / Consult your health care provider.  Hepatitis B vaccine.** / Consult your health care provider.  Haemophilus influenzae type b (Hib) vaccine.** / Consult your health care provider. ** Family history and personal history of risk and conditions may change your health care provider's recommendations. Document Released: 10/05/2001 Document Revised: 12/24/2013 Document Reviewed: 01/04/2011 Exit[employer] Patient Information 2015 [employer]. This information is not intended to replace advice given to you by your health care provider. Make sure you discuss any questions you have with your health care provider.  

## 2014-03-15 ENCOUNTER — Other Ambulatory Visit: Payer: Self-pay | Admitting: Internal Medicine

## 2014-03-15 LAB — INSULIN, FASTING: Insulin fasting, serum: 10 u[IU]/mL (ref 3–28)

## 2014-03-15 LAB — BASIC METABOLIC PANEL WITH GFR
BUN: 23 mg/dL (ref 6–23)
CALCIUM: 9.1 mg/dL (ref 8.4–10.5)
CO2: 34 mEq/L — ABNORMAL HIGH (ref 19–32)
Chloride: 99 mEq/L (ref 96–112)
Creat: 1.65 mg/dL — ABNORMAL HIGH (ref 0.50–1.10)
GFR, EST AFRICAN AMERICAN: 34 mL/min — AB
GFR, Est Non African American: 29 mL/min — ABNORMAL LOW
GLUCOSE: 128 mg/dL — AB (ref 70–99)
POTASSIUM: 3.1 meq/L — AB (ref 3.5–5.3)
SODIUM: 144 meq/L (ref 135–145)

## 2014-03-15 LAB — MAGNESIUM: Magnesium: 2.1 mg/dL (ref 1.5–2.5)

## 2014-03-15 LAB — HEPATIC FUNCTION PANEL
AST: 14 U/L (ref 0–37)
Albumin: 3.9 g/dL (ref 3.5–5.2)
Alkaline Phosphatase: 77 U/L (ref 39–117)
BILIRUBIN DIRECT: 0.1 mg/dL (ref 0.0–0.3)
BILIRUBIN INDIRECT: 0.4 mg/dL (ref 0.2–1.2)
BILIRUBIN TOTAL: 0.5 mg/dL (ref 0.2–1.2)
Total Protein: 6.5 g/dL (ref 6.0–8.3)

## 2014-03-15 LAB — URINALYSIS, MICROSCOPIC ONLY
Casts: NONE SEEN
Crystals: NONE SEEN

## 2014-03-15 LAB — MICROALBUMIN / CREATININE URINE RATIO
CREATININE, URINE: 189.2 mg/dL
MICROALB/CREAT RATIO: 18.3 mg/g (ref 0.0–30.0)
Microalb, Ur: 3.46 mg/dL — ABNORMAL HIGH (ref 0.00–1.89)

## 2014-03-15 LAB — VITAMIN D 25 HYDROXY (VIT D DEFICIENCY, FRACTURES): Vit D, 25-Hydroxy: 77 ng/mL (ref 30–89)

## 2014-03-15 LAB — LIPID PANEL
CHOL/HDL RATIO: 3.2 ratio
CHOLESTEROL: 172 mg/dL (ref 0–200)
HDL: 54 mg/dL (ref >39)
LDL Cholesterol: 71 mg/dL (ref 0–99)
Triglycerides: 237 mg/dL — ABNORMAL HIGH (ref <150)
VLDL: 47 mg/dL — AB (ref 0–40)

## 2014-03-15 LAB — TSH: TSH: 1.993 u[IU]/mL (ref 0.350–4.500)

## 2014-03-15 MED ORDER — POTASSIUM CHLORIDE ER 20 MEQ PO TBCR: EXTENDED_RELEASE_TABLET | ORAL | Status: DC

## 2014-03-19 ENCOUNTER — Ambulatory Visit (INDEPENDENT_AMBULATORY_CARE_PROVIDER_SITE_OTHER): Payer: Medicare Other | Admitting: *Deleted

## 2014-03-19 DIAGNOSIS — I4891 Unspecified atrial fibrillation: Secondary | ICD-10-CM

## 2014-03-19 LAB — POCT INR: INR: 2.5

## 2014-04-01 ENCOUNTER — Ambulatory Visit: Payer: Self-pay

## 2014-04-04 ENCOUNTER — Ambulatory Visit (INDEPENDENT_AMBULATORY_CARE_PROVIDER_SITE_OTHER): Payer: Medicare Other | Admitting: *Deleted

## 2014-04-04 DIAGNOSIS — Z79899 Other long term (current) drug therapy: Secondary | ICD-10-CM

## 2014-04-04 DIAGNOSIS — I10 Essential (primary) hypertension: Secondary | ICD-10-CM

## 2014-04-05 LAB — BASIC METABOLIC PANEL WITH GFR
BUN: 21 mg/dL (ref 6–23)
CHLORIDE: 99 meq/L (ref 96–112)
CO2: 28 meq/L (ref 19–32)
CREATININE: 1.75 mg/dL — AB (ref 0.50–1.10)
Calcium: 9.2 mg/dL (ref 8.4–10.5)
GFR, EST NON AFRICAN AMERICAN: 27 mL/min — AB
GFR, Est African American: 31 mL/min — ABNORMAL LOW
Glucose, Bld: 251 mg/dL — ABNORMAL HIGH (ref 70–99)
Potassium: 4.3 mEq/L (ref 3.5–5.3)
Sodium: 140 mEq/L (ref 135–145)

## 2014-04-30 ENCOUNTER — Ambulatory Visit (INDEPENDENT_AMBULATORY_CARE_PROVIDER_SITE_OTHER): Payer: Medicare Other | Admitting: *Deleted

## 2014-04-30 DIAGNOSIS — I4891 Unspecified atrial fibrillation: Secondary | ICD-10-CM

## 2014-04-30 LAB — POCT INR: INR: 3.2

## 2014-05-18 ENCOUNTER — Other Ambulatory Visit: Payer: Self-pay | Admitting: Internal Medicine

## 2014-05-26 ENCOUNTER — Other Ambulatory Visit: Payer: Self-pay | Admitting: Physician Assistant

## 2014-05-28 ENCOUNTER — Ambulatory Visit (INDEPENDENT_AMBULATORY_CARE_PROVIDER_SITE_OTHER): Payer: Medicare Other | Admitting: Pharmacist Clinician (PhC)/ Clinical Pharmacy Specialist

## 2014-05-28 ENCOUNTER — Other Ambulatory Visit: Payer: Self-pay | Admitting: *Deleted

## 2014-05-28 DIAGNOSIS — I4891 Unspecified atrial fibrillation: Secondary | ICD-10-CM

## 2014-05-28 LAB — POCT INR: INR: 2.7

## 2014-05-28 MED ORDER — FUROSEMIDE 40 MG PO TABS: ORAL_TABLET | ORAL | Status: DC

## 2014-05-31 ENCOUNTER — Other Ambulatory Visit: Payer: Self-pay | Admitting: Internal Medicine

## 2014-05-31 ENCOUNTER — Ambulatory Visit: Payer: Self-pay | Admitting: Physician Assistant

## 2014-05-31 MED ORDER — METFORMIN HCL ER 500 MG PO TB24
ORAL_TABLET | ORAL | Status: DC
Start: 2014-05-31 — End: 2014-06-01

## 2014-06-01 ENCOUNTER — Inpatient Hospital Stay (HOSPITAL_COMMUNITY)
Admission: EM | Admit: 2014-06-01 | Discharge: 2014-06-07 | DRG: 190 | Disposition: A | Discharge status: Home Health | Payer: Medicare Other | Attending: Internal Medicine | Admitting: Internal Medicine

## 2014-06-01 ENCOUNTER — Emergency Department (HOSPITAL_COMMUNITY): Payer: Medicare Other

## 2014-06-01 ENCOUNTER — Encounter (HOSPITAL_COMMUNITY): Payer: Self-pay | Admitting: Emergency Medicine

## 2014-06-01 DIAGNOSIS — R0602 Shortness of breath: Secondary | ICD-10-CM | POA: Diagnosis not present

## 2014-06-01 DIAGNOSIS — Z7982 Long term (current) use of aspirin: Secondary | ICD-10-CM

## 2014-06-01 DIAGNOSIS — F329 Major depressive disorder, single episode, unspecified: Secondary | ICD-10-CM | POA: Diagnosis present

## 2014-06-01 DIAGNOSIS — Z825 Family history of asthma and other chronic lower respiratory diseases: Secondary | ICD-10-CM | POA: Diagnosis not present

## 2014-06-01 DIAGNOSIS — Z86010 Personal history of colonic polyps: Secondary | ICD-10-CM

## 2014-06-01 DIAGNOSIS — I5181 Takotsubo syndrome: Secondary | ICD-10-CM | POA: Diagnosis present

## 2014-06-01 DIAGNOSIS — Z87891 Personal history of nicotine dependence: Secondary | ICD-10-CM

## 2014-06-01 DIAGNOSIS — Z889 Allergy status to unspecified drugs, medicaments and biological substances status: Secondary | ICD-10-CM

## 2014-06-01 DIAGNOSIS — I714 Abdominal aortic aneurysm, without rupture: Secondary | ICD-10-CM | POA: Diagnosis present

## 2014-06-01 DIAGNOSIS — Z881 Allergy status to other antibiotic agents status: Secondary | ICD-10-CM

## 2014-06-01 DIAGNOSIS — Z9229 Personal history of other drug therapy: Secondary | ICD-10-CM | POA: Diagnosis not present

## 2014-06-01 DIAGNOSIS — F419 Anxiety disorder, unspecified: Secondary | ICD-10-CM | POA: Diagnosis present

## 2014-06-01 DIAGNOSIS — I482 Chronic atrial fibrillation, unspecified: Secondary | ICD-10-CM

## 2014-06-01 DIAGNOSIS — E785 Hyperlipidemia, unspecified: Secondary | ICD-10-CM | POA: Diagnosis present

## 2014-06-01 DIAGNOSIS — J189 Pneumonia, unspecified organism: Secondary | ICD-10-CM

## 2014-06-01 DIAGNOSIS — I472 Ventricular tachycardia: Secondary | ICD-10-CM | POA: Diagnosis not present

## 2014-06-01 DIAGNOSIS — Z9981 Dependence on supplemental oxygen: Secondary | ICD-10-CM | POA: Diagnosis not present

## 2014-06-01 DIAGNOSIS — K219 Gastro-esophageal reflux disease without esophagitis: Secondary | ICD-10-CM | POA: Diagnosis present

## 2014-06-01 DIAGNOSIS — I442 Atrioventricular block, complete: Secondary | ICD-10-CM | POA: Diagnosis present

## 2014-06-01 DIAGNOSIS — N179 Acute kidney failure, unspecified: Secondary | ICD-10-CM | POA: Diagnosis present

## 2014-06-01 DIAGNOSIS — R778 Other specified abnormalities of plasma proteins: Secondary | ICD-10-CM

## 2014-06-01 DIAGNOSIS — I129 Hypertensive chronic kidney disease with stage 1 through stage 4 chronic kidney disease, or unspecified chronic kidney disease: Secondary | ICD-10-CM | POA: Diagnosis present

## 2014-06-01 DIAGNOSIS — J45909 Unspecified asthma, uncomplicated: Secondary | ICD-10-CM | POA: Diagnosis present

## 2014-06-01 DIAGNOSIS — Z800 Family history of malignant neoplasm of digestive organs: Secondary | ICD-10-CM | POA: Diagnosis not present

## 2014-06-01 DIAGNOSIS — E1165 Type 2 diabetes mellitus with hyperglycemia: Secondary | ICD-10-CM | POA: Diagnosis present

## 2014-06-01 DIAGNOSIS — R0603 Acute respiratory distress: Secondary | ICD-10-CM

## 2014-06-01 DIAGNOSIS — Z66 Do not resuscitate: Secondary | ICD-10-CM | POA: Diagnosis present

## 2014-06-01 DIAGNOSIS — I5032 Chronic diastolic (congestive) heart failure: Secondary | ICD-10-CM | POA: Diagnosis present

## 2014-06-01 DIAGNOSIS — E1122 Type 2 diabetes mellitus with diabetic chronic kidney disease: Secondary | ICD-10-CM

## 2014-06-01 DIAGNOSIS — Z8249 Family history of ischemic heart disease and other diseases of the circulatory system: Secondary | ICD-10-CM

## 2014-06-01 DIAGNOSIS — I493 Ventricular premature depolarization: Secondary | ICD-10-CM | POA: Diagnosis present

## 2014-06-01 DIAGNOSIS — J441 Chronic obstructive pulmonary disease with (acute) exacerbation: Secondary | ICD-10-CM | POA: Diagnosis present

## 2014-06-01 DIAGNOSIS — R0902 Hypoxemia: Secondary | ICD-10-CM

## 2014-06-01 DIAGNOSIS — R7989 Other specified abnormal findings of blood chemistry: Secondary | ICD-10-CM

## 2014-06-01 DIAGNOSIS — Z880 Allergy status to penicillin: Secondary | ICD-10-CM

## 2014-06-01 DIAGNOSIS — E118 Type 2 diabetes mellitus with unspecified complications: Secondary | ICD-10-CM

## 2014-06-01 DIAGNOSIS — Z7952 Long term (current) use of systemic steroids: Secondary | ICD-10-CM

## 2014-06-01 DIAGNOSIS — I4729 Other ventricular tachycardia: Secondary | ICD-10-CM

## 2014-06-01 DIAGNOSIS — J9811 Atelectasis: Secondary | ICD-10-CM | POA: Diagnosis present

## 2014-06-01 DIAGNOSIS — Z882 Allergy status to sulfonamides status: Secondary | ICD-10-CM

## 2014-06-01 DIAGNOSIS — I10 Essential (primary) hypertension: Secondary | ICD-10-CM

## 2014-06-01 DIAGNOSIS — Z823 Family history of stroke: Secondary | ICD-10-CM

## 2014-06-01 DIAGNOSIS — I480 Paroxysmal atrial fibrillation: Secondary | ICD-10-CM

## 2014-06-01 DIAGNOSIS — Z7901 Long term (current) use of anticoagulants: Secondary | ICD-10-CM | POA: Diagnosis not present

## 2014-06-01 DIAGNOSIS — I214 Non-ST elevation (NSTEMI) myocardial infarction: Secondary | ICD-10-CM

## 2014-06-01 DIAGNOSIS — N189 Chronic kidney disease, unspecified: Secondary | ICD-10-CM

## 2014-06-01 DIAGNOSIS — J9621 Acute and chronic respiratory failure with hypoxia: Secondary | ICD-10-CM | POA: Diagnosis present

## 2014-06-01 DIAGNOSIS — N184 Chronic kidney disease, stage 4 (severe): Secondary | ICD-10-CM | POA: Diagnosis present

## 2014-06-01 DIAGNOSIS — I5022 Chronic systolic (congestive) heart failure: Secondary | ICD-10-CM | POA: Diagnosis present

## 2014-06-01 DIAGNOSIS — E538 Deficiency of other specified B group vitamins: Secondary | ICD-10-CM

## 2014-06-01 DIAGNOSIS — I248 Other forms of acute ischemic heart disease: Secondary | ICD-10-CM

## 2014-06-01 DIAGNOSIS — F341 Dysthymic disorder: Secondary | ICD-10-CM

## 2014-06-01 DIAGNOSIS — Z950 Presence of cardiac pacemaker: Secondary | ICD-10-CM | POA: Diagnosis not present

## 2014-06-01 DIAGNOSIS — IMO0002 Reserved for concepts with insufficient information to code with codable children: Secondary | ICD-10-CM

## 2014-06-01 DIAGNOSIS — Z8679 Personal history of other diseases of the circulatory system: Secondary | ICD-10-CM

## 2014-06-01 DIAGNOSIS — E782 Mixed hyperlipidemia: Secondary | ICD-10-CM

## 2014-06-01 DIAGNOSIS — N183 Chronic kidney disease, stage 3 (moderate): Secondary | ICD-10-CM

## 2014-06-01 LAB — CBC WITH DIFFERENTIAL/PLATELET
BASOS ABS: 0 10*3/uL (ref 0.0–0.1)
BASOS PCT: 1 % (ref 0–1)
EOS ABS: 0 10*3/uL (ref 0.0–0.7)
EOS PCT: 0 % (ref 0–5)
HCT: 39.5 % (ref 36.0–46.0)
Hemoglobin: 12 g/dL (ref 12.0–15.0)
LYMPHS PCT: 10 % — AB (ref 12–46)
Lymphs Abs: 0.8 10*3/uL (ref 0.7–4.0)
MCH: 30 pg (ref 26.0–34.0)
MCHC: 30.4 g/dL (ref 30.0–36.0)
MCV: 98.8 fL (ref 78.0–100.0)
MONO ABS: 0.8 10*3/uL (ref 0.1–1.0)
Monocytes Relative: 9 % (ref 3–12)
Neutro Abs: 6.7 10*3/uL (ref 1.7–7.7)
Neutrophils Relative %: 80 % — ABNORMAL HIGH (ref 43–77)
PLATELETS: 103 10*3/uL — AB (ref 150–400)
RBC: 4 MIL/uL (ref 3.87–5.11)
RDW: 15.2 % (ref 11.5–15.5)
WBC: 8.3 10*3/uL (ref 4.0–10.5)

## 2014-06-01 LAB — TROPONIN I: Troponin I: 0.75 ng/mL (ref <0.30)

## 2014-06-01 LAB — URINALYSIS, ROUTINE W REFLEX MICROSCOPIC
Bilirubin Urine: NEGATIVE
Glucose, UA: NEGATIVE mg/dL
Ketones, ur: 15 mg/dL — AB
NITRITE: NEGATIVE
PH: 5.5 (ref 5.0–8.0)
Protein, ur: 30 mg/dL — AB
SPECIFIC GRAVITY, URINE: 1.021 (ref 1.005–1.030)
Urobilinogen, UA: 0.2 mg/dL (ref 0.0–1.0)

## 2014-06-01 LAB — URINE MICROSCOPIC-ADD ON

## 2014-06-01 LAB — I-STAT ARTERIAL BLOOD GAS, ED
ACID-BASE EXCESS: 3 mmol/L — AB (ref 0.0–2.0)
Bicarbonate: 29.2 mEq/L — ABNORMAL HIGH (ref 20.0–24.0)
O2 Saturation: 92 %
PH ART: 7.387 (ref 7.350–7.450)
TCO2: 31 mmol/L (ref 0–100)
pCO2 arterial: 48.5 mmHg — ABNORMAL HIGH (ref 35.0–45.0)
pO2, Arterial: 66 mmHg — ABNORMAL LOW (ref 80.0–100.0)

## 2014-06-01 LAB — I-STAT TROPONIN, ED: Troponin i, poc: 0.4 ng/mL (ref 0.00–0.08)

## 2014-06-01 LAB — I-STAT CHEM 8, ED
BUN: 29 mg/dL — AB (ref 6–23)
CALCIUM ION: 1.12 mmol/L — AB (ref 1.13–1.30)
Chloride: 101 mEq/L (ref 96–112)
Creatinine, Ser: 2.5 mg/dL — ABNORMAL HIGH (ref 0.50–1.10)
GLUCOSE: 222 mg/dL — AB (ref 70–99)
HCT: 39 % (ref 36.0–46.0)
Hemoglobin: 13.3 g/dL (ref 12.0–15.0)
Potassium: 3.9 mEq/L (ref 3.7–5.3)
Sodium: 139 mEq/L (ref 137–147)
TCO2: 28 mmol/L (ref 0–100)

## 2014-06-01 LAB — HEPATIC FUNCTION PANEL
ALT: 11 U/L (ref 0–35)
AST: 32 U/L (ref 0–37)
Albumin: 3.4 g/dL — ABNORMAL LOW (ref 3.5–5.2)
Alkaline Phosphatase: 67 U/L (ref 39–117)
Bilirubin, Direct: <0.2 mg/dL (ref 0.0–0.3)
TOTAL PROTEIN: 6.7 g/dL (ref 6.0–8.3)
Total Bilirubin: 0.7 mg/dL (ref 0.3–1.2)

## 2014-06-01 LAB — PROTIME-INR
INR: 2.17 — AB (ref 0.00–1.49)
Prothrombin Time: 24.2 seconds — ABNORMAL HIGH (ref 11.6–15.2)

## 2014-06-01 MED ORDER — INSULIN ASPART 100 UNIT/ML ~~LOC~~ SOLN
0.0000 [IU] | Freq: Three times a day (TID) | SUBCUTANEOUS | Status: DC
  Administered 2014-06-02: 3 [IU] via SUBCUTANEOUS
  Administered 2014-06-02 (×2): 8 [IU] via SUBCUTANEOUS
  Administered 2014-06-03: 3 [IU] via SUBCUTANEOUS
  Administered 2014-06-03 (×2): 5 [IU] via SUBCUTANEOUS
  Administered 2014-06-04 (×3): 8 [IU] via SUBCUTANEOUS
  Administered 2014-06-05: 11 [IU] via SUBCUTANEOUS
  Administered 2014-06-05: 2 [IU] via SUBCUTANEOUS
  Administered 2014-06-05: 8 [IU] via SUBCUTANEOUS
  Administered 2014-06-06: 5 [IU] via SUBCUTANEOUS
  Administered 2014-06-06: 11 [IU] via SUBCUTANEOUS
  Administered 2014-06-06 – 2014-06-07 (×3): 3 [IU] via SUBCUTANEOUS

## 2014-06-01 MED ORDER — ALBUTEROL (5 MG/ML) CONTINUOUS INHALATION SOLN
10.0000 mg/h | INHALATION_SOLUTION | RESPIRATORY_TRACT | Status: AC
  Administered 2014-06-01: 10 mg/h via RESPIRATORY_TRACT
  Filled 2014-06-01: qty 20

## 2014-06-01 MED ORDER — WARFARIN SODIUM 5 MG PO TABS
5.0000 mg | ORAL_TABLET | Freq: Every day | ORAL | Status: DC
Start: 2014-06-01 — End: 2014-06-01
  Filled 2014-06-01: qty 1

## 2014-06-01 MED ORDER — ASPIRIN 81 MG PO CHEW
324.0000 mg | CHEWABLE_TABLET | Freq: Once | ORAL | Status: AC
  Administered 2014-06-01: 324 mg via ORAL
  Filled 2014-06-01: qty 4

## 2014-06-01 MED ORDER — WARFARIN SODIUM 2.5 MG PO TABS
2.5000 mg | ORAL_TABLET | Freq: Every day | ORAL | Status: DC
  Administered 2014-06-01 – 2014-06-02 (×2): 2.5 mg via ORAL
  Filled 2014-06-01 (×4): qty 1

## 2014-06-01 MED ORDER — WARFARIN - PHARMACIST DOSING INPATIENT: Freq: Every day | Status: DC

## 2014-06-01 MED ORDER — CEFTRIAXONE SODIUM 1 G IJ SOLR
1.0000 g | INTRAMUSCULAR | Status: DC
  Administered 2014-06-01 – 2014-06-02 (×2): 1 g via INTRAVENOUS
  Filled 2014-06-01 (×3): qty 10

## 2014-06-01 MED ORDER — ASPIRIN EC 81 MG PO TBEC
81.0000 mg | DELAYED_RELEASE_TABLET | Freq: Every day | ORAL | Status: DC
  Administered 2014-06-02 – 2014-06-04 (×3): 81 mg via ORAL
  Filled 2014-06-01 (×4): qty 1

## 2014-06-01 MED ORDER — PAROXETINE HCL 20 MG PO TABS
20.0000 mg | ORAL_TABLET | Freq: Every day | ORAL | Status: DC
  Administered 2014-06-02 – 2014-06-04 (×3): 20 mg via ORAL
  Filled 2014-06-01 (×4): qty 1

## 2014-06-01 MED ORDER — GUAIFENESIN ER 600 MG PO TB12
600.0000 mg | ORAL_TABLET | Freq: Two times a day (BID) | ORAL | Status: DC
  Administered 2014-06-01 – 2014-06-07 (×12): 600 mg via ORAL
  Filled 2014-06-01 (×15): qty 1

## 2014-06-01 MED ORDER — ONDANSETRON HCL 4 MG PO TABS: 4.0000 mg | ORAL_TABLET | Freq: Four times a day (QID) | ORAL | Status: DC | PRN

## 2014-06-01 MED ORDER — HEPARIN BOLUS VIA INFUSION
4000.0000 [IU] | Freq: Once | INTRAVENOUS | Status: DC
  Filled 2014-06-01: qty 4000

## 2014-06-01 MED ORDER — METHYLPREDNISOLONE SODIUM SUCC 125 MG IJ SOLR
125.0000 mg | Freq: Once | INTRAMUSCULAR | Status: AC
  Administered 2014-06-01: 125 mg via INTRAVENOUS
  Filled 2014-06-01: qty 2

## 2014-06-01 MED ORDER — INSULIN ASPART 100 UNIT/ML ~~LOC~~ SOLN
0.0000 [IU] | Freq: Every day | SUBCUTANEOUS | Status: DC
  Administered 2014-06-02: 2 [IU] via SUBCUTANEOUS
  Administered 2014-06-03: 3 [IU] via SUBCUTANEOUS
  Administered 2014-06-04 – 2014-06-05 (×2): 4 [IU] via SUBCUTANEOUS
  Administered 2014-06-06: 2 [IU] via SUBCUTANEOUS

## 2014-06-01 MED ORDER — ACETAMINOPHEN 650 MG RE SUPP: 650.0000 mg | Freq: Four times a day (QID) | RECTAL | Status: DC | PRN

## 2014-06-01 MED ORDER — IPRATROPIUM BROMIDE 0.02 % IN SOLN
0.5000 mg | Freq: Four times a day (QID) | RESPIRATORY_TRACT | Status: DC
  Administered 2014-06-01 – 2014-06-02 (×5): 0.5 mg via RESPIRATORY_TRACT
  Filled 2014-06-01 (×4): qty 2.5

## 2014-06-01 MED ORDER — MONTELUKAST SODIUM 10 MG PO TABS
10.0000 mg | ORAL_TABLET | Freq: Every day | ORAL | Status: DC
  Administered 2014-06-01 – 2014-06-06 (×6): 10 mg via ORAL
  Filled 2014-06-01 (×8): qty 1

## 2014-06-01 MED ORDER — AZITHROMYCIN 500 MG IV SOLR
500.0000 mg | INTRAVENOUS | Status: DC
  Administered 2014-06-01 – 2014-06-02 (×2): 500 mg via INTRAVENOUS
  Filled 2014-06-01 (×3): qty 500

## 2014-06-01 MED ORDER — SODIUM CHLORIDE 0.9 % IV SOLN
INTRAVENOUS | Status: AC
  Administered 2014-06-01: 23:00:00 via INTRAVENOUS

## 2014-06-01 MED ORDER — ALBUTEROL SULFATE (2.5 MG/3ML) 0.083% IN NEBU: 2.5000 mg | INHALATION_SOLUTION | RESPIRATORY_TRACT | Status: AC | PRN

## 2014-06-01 MED ORDER — ATORVASTATIN CALCIUM 40 MG PO TABS
40.0000 mg | ORAL_TABLET | Freq: Every day | ORAL | Status: DC
  Administered 2014-06-02 – 2014-06-06 (×5): 40 mg via ORAL
  Filled 2014-06-01 (×7): qty 1

## 2014-06-01 MED ORDER — ONDANSETRON HCL 4 MG/2ML IJ SOLN: 4.0000 mg | Freq: Three times a day (TID) | INTRAMUSCULAR | Status: AC | PRN

## 2014-06-01 MED ORDER — PANTOPRAZOLE SODIUM 40 MG PO TBEC
40.0000 mg | DELAYED_RELEASE_TABLET | Freq: Every day | ORAL | Status: DC
  Administered 2014-06-02 – 2014-06-03 (×2): 40 mg via ORAL
  Filled 2014-06-01 (×2): qty 1

## 2014-06-01 MED ORDER — ONDANSETRON HCL 4 MG/2ML IJ SOLN: 4.0000 mg | Freq: Four times a day (QID) | INTRAMUSCULAR | Status: DC | PRN

## 2014-06-01 MED ORDER — ACETAMINOPHEN 325 MG PO TABS: 650.0000 mg | ORAL_TABLET | Freq: Four times a day (QID) | ORAL | Status: DC | PRN

## 2014-06-01 MED ORDER — METHYLPREDNISOLONE SODIUM SUCC 125 MG IJ SOLR
60.0000 mg | Freq: Two times a day (BID) | INTRAMUSCULAR | Status: DC
  Administered 2014-06-02 – 2014-06-04 (×7): 60 mg via INTRAVENOUS
  Filled 2014-06-01: qty 0.96
  Filled 2014-06-01: qty 2
  Filled 2014-06-01 (×4): qty 0.96
  Filled 2014-06-01: qty 2
  Filled 2014-06-01: qty 0.96
  Filled 2014-06-01: qty 2

## 2014-06-01 MED ORDER — SODIUM CHLORIDE 0.9 % IJ SOLN
3.0000 mL | Freq: Two times a day (BID) | INTRAMUSCULAR | Status: DC
  Administered 2014-06-01 – 2014-06-07 (×11): 3 mL via INTRAVENOUS
  Filled 2014-06-01: qty 3

## 2014-06-01 MED ORDER — MOMETASONE FURO-FORMOTEROL FUM 200-5 MCG/ACT IN AERO
2.0000 | INHALATION_SPRAY | Freq: Two times a day (BID) | RESPIRATORY_TRACT | Status: DC
  Administered 2014-06-01 – 2014-06-03 (×4): 2 via RESPIRATORY_TRACT
  Filled 2014-06-01: qty 8.8

## 2014-06-01 MED ORDER — HEPARIN (PORCINE) IN NACL 100-0.45 UNIT/ML-% IJ SOLN
12.0000 [IU]/kg/h | INTRAMUSCULAR | Status: DC
  Filled 2014-06-01: qty 250

## 2014-06-01 MED ORDER — HYDROCODONE-ACETAMINOPHEN 5-325 MG PO TABS: 1.0000 | ORAL_TABLET | ORAL | Status: DC | PRN

## 2014-06-01 NOTE — H&P (Signed)
PCP: [name],[name], MD  [facility]  Chief Complaint:  Shortness of breath  HPI: [name] MarvelGertrude S Kramlich is a 80 y.o. female   has a past medical history of AV block, complete; CHF (congestive heart failure); AAA (abdominal aortic aneurysm); Dyslipidemia; History of colonic polyps; Anxiety and depression; Diverticulosis of colon; Asthma; Acute renal failure; Atrial flutter; Hyperglycemia; UTI (lower urinary tract infection); COPD (chronic obstructive pulmonary disease); HTN (hypertension); DM (diabetes mellitus); GERD (gastroesophageal reflux disease); DVT (deep venous thrombosis); B12 deficiency; and Angiodysplasia of intestine (without mention of hemorrhage).   Presented with  1 week history of worsening shortness of breath, fever up to 101 that got better with tylenol. Patient improved at first. Next few days she developed diarrhea that lasted about 24 hours. Possibly decrease fluid intake due to lethargy. NO sick contacts. Patient had Hx of COPD and asthma she has been seen by Dr. Sherene SiresWert years ago. Patient is being maintained on daily prednisone 10 mg a day.  Usually patient is on 5 L of oxygen at home but yesterday her family increased it to 6L. Patient presented with worsening shortness of breath requiring continuous nebulizer while in ER. She was also found to have increase in creatine from 1.75 to 2.17. Troponin elevated to 0.4 ->0.75, she denies any chest pain.  Hospitalist was called for admission for COPD exacerbation.   Review of Systems:    Pertinent positives include: Fevers, chills, fatigue, shortness of breath at rest. diarrhea,  Constitutional:  No weight loss, night sweats,  weight loss  HEENT:  No headaches, Difficulty swallowing,Tooth/dental problems,Sore throat,  No sneezing, itching, ear ache, nasal congestion, post nasal drip,  Cardio-vascular:  No chest pain, Orthopnea, PND, anasarca, dizziness, palpitations.no Bilateral lower extremity  swelling  GI:  No heartburn, indigestion, abdominal pain, nausea, vomiting,  change in bowel habits, loss of appetite, melena, blood in stool, hematemesis Resp:   No dyspnea on exertion, No excess mucus, no productive cough, No non-productive cough, No coughing up of blood.No change in color of mucus.No wheezing. Skin:  no rash or lesions. No jaundice GU:  no dysuria, change in color of urine, no urgency or frequency. No straining to urinate.  No flank pain.  Musculoskeletal:  No joint pain or no joint swelling. No decreased range of motion. No back pain.  Psych:  No change in mood or affect. No depression or anxiety. No memory loss.  Neuro: no localizing neurological complaints, no tingling, no weakness, no double vision, no gait abnormality, no slurred speech, no confusion  Otherwise ROS are negative except for above, 10 systems were reviewed  Past Medical History: Past Medical History  Diagnosis Date  . AV block, complete   . CHF (congestive heart failure)   . AAA (abdominal aortic aneurysm)   . Dyslipidemia   . History of colonic polyps   . Anxiety and depression   . Diverticulosis of colon   . Asthma   . Acute renal failure   . Atrial flutter   . Hyperglycemia   . UTI (lower urinary tract infection)   . COPD (chronic obstructive pulmonary disease)   . HTN (hypertension)   . DM (diabetes mellitus)   . GERD (gastroesophageal reflux disease)   . DVT (deep venous thrombosis)   . B12 deficiency   . Angiodysplasia of intestine (without mention of hemorrhage)    Past Surgical History  Procedure Laterality Date  . Tubal ligation    . Abdominal hysterectomy    . Appendectomy    .  Pacemaker insertion      medtronic   . Colonoscopy  02/06/2002    161.09,[phone],562.10  . Bladder suspension       Medications: Prior to Admission medications   Medication Sig Start Date End Date Taking? Authorizing Provider  albuterol (VENTOLIN HFA) 108 (90 BASE) MCG/ACT inhaler Inhale 2 puffs  into the lungs every 6 (six) hours as needed. For shortness of breath. 02/14/14  Yes Lucky CowboyWilliam McKeown, MD  aspirin 81 MG EC tablet Take 81 mg by mouth daily.     Yes Historical Provider, MD  Cholecalciferol (VITAMIN D) 2000 UNITS tablet Take 5,000 Units by mouth daily.    Yes Historical Provider, MD  dexlansoprazole (DEXILANT) 60 MG capsule Take 60 mg by mouth daily.   Yes Historical Provider, MD  furosemide (LASIX) 40 MG tablet Take 40 mg by mouth daily.   Yes Historical Provider, MD  montelukast (SINGULAIR) 10 MG tablet Take 10 mg by mouth at bedtime.   Yes Historical Provider, MD  PARoxetine (PAXIL) 20 MG tablet Take 20 mg by mouth daily.  10/19/13  Yes Historical Provider, MD  predniSONE (DELTASONE) 10 MG tablet Take 10 mg by mouth daily with breakfast.   Yes Historical Provider, MD  simvastatin (ZOCOR) 80 MG tablet Take 40 mg by mouth every Monday, Wednesday, and Friday.  02/24/14  Yes Historical Provider, MD  vitamin B-12 (CYANOCOBALAMIN) 1000 MCG tablet Take 1,000 mcg by mouth daily.   Yes Historical Provider, MD  warfarin (COUMADIN) 5 MG tablet Take 5 mg by mouth daily.   Yes Historical Provider, MD  ONE TOUCH ULTRA TEST test strip USE TO CHECK BLOOD GLUCOSE ONCE A DAY AS INSTRUCTED 05/19/14   Lucky CowboyWilliam McKeown, MD    Allergies:   Allergies  Allergen Reactions  . Advair Diskus [Fluticasone-Salmeterol]     Itching  . Biaxin [Clarithromycin]     N/V  . Ciprofloxacin     N/V  . Doxycycline Other (See Comments)    Inside of mouth and tongue red and peeled  . Keflex [Cephalexin]     N/V  . Ketek [Telithromycin]     N/V  . Levaquin [Levofloxacin In D5w]     N/V  . Macrobid [Nitrofurantoin]     N/V  . Penicillins     Rash  . Sulfonamide Derivatives     Social History:  Ambulatory   Independently  Lives at home   With family     reports that she quit smoking about 25 years ago. She has never used smokeless tobacco. She reports that she does not drink alcohol or use illicit drugs.     Family History: family history includes Asthma in her daughter; CVA in her mother; Colon cancer in her sister; Emphysema in her maternal uncle; Heart disease in her father and mother.    Physical Exam: Patient Vitals for the past 24 hrs:  BP Temp Temp src Pulse Resp SpO2 Height Weight  06/01/14 2000 110/50 mmHg - - 70 24 88 % - -  06/01/14 1930 107/45 mmHg - - 70 23 87 % - -  06/01/14 1900 105/45 mmHg - - 73 25 88 % - -  06/01/14 1852 - - - - - 90 % - -  06/01/14 1850 - - - - - 85 % - -  06/01/14 1830 115/50 mmHg - - 70 24 93 % - -  06/01/14 1815 127/56 mmHg - - 70 - 95 % - -  06/01/14 1800 113/56 mmHg - - 70 -  95 % - -  06/01/14 1745 114/53 mmHg - - 70 21 95 % - -  06/01/14 1730 111/58 mmHg - - 69 25 95 % - -  06/01/14 1715 108/58 mmHg - - 70 25 95 % - -  06/01/14 1700 110/60 mmHg - - 68 26 93 % - -  06/01/14 1659 - - - 70 17 92 % - -  06/01/14 1645 110/57 mmHg - - 70 22 91 % - -  06/01/14 1639 112/55 mmHg 98.7 F (37.1 C) Oral 69 26 89 % - -  06/01/14 1638 - - - - 20 90 % 5\' 4"  (1.626 m) 83.008 kg (183 lb)  06/01/14 1633 - - - - - 95 % - -    1. General:  in No Acute distress 2. Psychological: lethargic but Oriented 3. Head/ENT:     Dry Mucous Membranes                          Head Non traumatic, neck supple                          Normal  Dentition 4. SKIN:   decreased Skin turgor,  Skin clean Dry and intact no rash 5. Heart: Regular rate and rhythm no Murmur, Rub or gallop 6. Lungs:  Extensive  Wheezes bilateraly but no crackles   7. Abdomen: Soft, non-tender, Non distended, obese 8. Lower extremities: no clubbing, cyanosis, or edema 9. Neurologically Grossly intact, moving all 4 extremities equally 10. MSK: Normal range of motion  body mass index is 31.4 kg/(m^2).   Labs on Admission:   Recent Labs  06/01/14 1656  NA 139  K 3.9  CL 101  GLUCOSE 222*  BUN 29*  CREATININE 2.50*    Recent Labs  06/01/14 1649  AST 32  ALT 11  ALKPHOS 67  BILITOT  0.7  PROT 6.7  ALBUMIN 3.4*   No results found for this basename: LIPASE, AMYLASE,  in the last 72 hours  Recent Labs  06/01/14 1649 06/01/14 1656  WBC 8.3  --   NEUTROABS 6.7  --   HGB 12.0 13.3  HCT 39.5 39.0  MCV 98.8  --   PLT 103*  --    No results found for this basename: CKTOTAL, CKMB, CKMBINDEX, TROPONINI,  in the last 72 hours No results found for this basename: TSH, T4TOTAL, FREET3, T3FREE, THYROIDAB,  in the last 72 hours No results found for this basename: VITAMINB12, FOLATE, FERRITIN, TIBC, IRON, RETICCTPCT,  in the last 72 hours Lab Results  Component Value Date   HGBA1C 7.4* 03/14/2014    Estimated Creatinine Clearance: 18.7 ml/min (by C-G formula based on Cr of 2.5). ABG    Component Value Date/Time   PHART 7.387 06/01/2014 1705   HCO3 29.2* 06/01/2014 1705   TCO2 31 06/01/2014 1705   ACIDBASEDEF 1.6 12/05/2013 0501   O2SAT 92.0 06/01/2014 1705     No results found for this basename: DDIMER     Other results:  I have pearsonaly reviewed this: ECG REPORT  Rate: 71  Rhythm: RBBB ST&T Change: no change   UA no evidence of UTI  BNP (last 3 results)  Recent Labs  12/04/13 2258  PROBNP 17479.0*    Filed Weights   06/01/14 1638  Weight: 83.008 kg (183 lb)     Cultures:    Component Value Date/Time   SDES BLOOD  BLOOD RIGHT FOREARM 12/04/2013 2258   SDES BLOOD BLOOD LEFT FOREARM 12/04/2013 2258   SPECREQUEST BOTTLES DRAWN AEROBIC AND ANAEROBIC 7CC AEROBIC 3CC ANAEROBIC 12/04/2013 2258   SPECREQUEST BOTTLES DRAWN AEROBIC AND ANAEROBIC 10CC EACH 12/04/2013 2258   CULT  Value: NO GROWTH 5 DAYS Performed at [facility] 12/04/2013 2258   CULT  Value: STAPHYLOCOCCUS SPECIES (COAGULASE NEGATIVE) Note: THE SIGNIFICANCE OF ISOLATING THIS ORGANISM FROM A SINGLE SET OF BLOOD CULTURES WHEN MULTIPLE SETS ARE DRAWN IS UNCERTAIN. PLEASE NOTIFY THE MICROBIOLOGY DEPARTMENT WITHIN ONE WEEK IF SPECIATION AND SENSITIVITIES ARE REQUIRED. Note: Gram Stain  Report Called to,Read Back By and Verified With: LEE WORLEY 12/06/13 1242A FULKC Performed at [facility] 12/04/2013 2258   REPTSTATUS 12/11/2013 FINAL 12/04/2013 2258   REPTSTATUS 12/07/2013 FINAL 12/04/2013 2258       Radiological Exams on Admission: Dg Chest Port 1 View  06/01/2014   CLINICAL DATA:  Shortness of breath  EXAM: PORTABLE CHEST - 1 VIEW  COMPARISON:  December 09, 2013  FINDINGS: The heart size and mediastinal contours are stable. The heart size is enlarged. Cardiac pacemaker is unchanged. There are mild atelectasis of the right upper lobe and left mid to lower lung. There is no focal pneumonia, pulmonary edema, or pleural effusion. The visualized skeletal structures are stable.  IMPRESSION: Mild atelectasis of both lungs as described. No focal pneumonia or pulmonary edema.   Electronically Signed   By: Sherian Rein M.D.   On: 06/01/2014 17:54    Chart has been reviewed  Assessment/Plan  80 year old female with history of COPD on 5 L of oxygen, atrial fibrillation on chronic Coumadin, diastolic heart failure, and chronic kidney disease presents with worsening shortness of breath evidence of hypoxia worrisome for COPD exacerbation as well as acute on chronic renal failure in the setting of diarrhea x24 hours and decreased by mouth intake  Present on Admission:  . Respiratory distress - admit to stepdown given increased oxygen requirement, treat for COPD exacerbation   . COPD exacerbation - will treat with albuterol as needed, Atrovent scheduled, IV steroids, IV antibiotics and dulera . Atrial fibrillation currently rate controlled, status post pacemaker for complete heart block. Continue Coumadin with therapeutic INR currently  . Chronic diastolic heart failure - currently stable appears to be fluid down. We'll hold Lasix and give a gentle IV fluids she'll likely will need to have her Lasix restarted the near future total with fluid overload  . Acute on chronic renal failure  patient appears to be dehydrated decrease fluid intake plus diarrhea. Hold Lasix give gentle IV fluids obtain urine electrolytes  . Acute on chronic respiratory failure with hypoxemia admit to step down face mask oxygen given mouth breathing currently improving hold off on repeating ABG  . Elevated troponin-  in the setting of episode of hypoxia increased work of breathing. Chest pain-free no significant changes on EKG. Will discuss with cardiology. Cardiology consult in a.m. Or sooner if decompensates continue to cycle cardiac enzymes. Obtain serial EKG. Echo gram . HypertensionCurrently somewhat soft blood pressures will hold blood pressure medications   Prophylaxis: Coumadin, Protonix  CODE STATUS:   DNR/DNI as per patient's wishes  Other plan as per orders.  I have spent a total of 55 min on this admission  Kamesha Herne 06/01/2014, 8:34 PM  [facility]  Pager [phone]   after 2 AM please page floor coverage PA If 7AM-7PM, please contact the day team taking care of the patient  [email or web address]  Password  [email or web address]

## 2014-06-01 NOTE — ED Provider Notes (Signed)
CSN: [id number]     Arrival date & time 06/01/14  1623 History   First MD Initiated Contact with Patient 06/01/14 1624     Chief Complaint  Patient presents with  . Weakness     (Consider location/radiation/quality/duration/timing/severity/associated sxs/prior Treatment) HPI Comments: Pt is an 80 y/o female with a history of COPD, congestive heart failure, diabetes, AAA and DVT. She currently takes medications including Coumadin. She presents with a complaint of generalized weakness and increased shortness of breath. According to the patient and the paramedics the patient lives by herself, she was not answering the phone and thus her daughter went to check on her finding her unable to ambulate. Paramedics were called and found the patient hypoxic to 85% on her home at 2 L by nasal cannula. The patient did require significant assistance to ambulate to the stretcher, she required nonrebreather for adequate oxygenation of 95%. The patient denies any other complaints including chest pain, headache, sore throat, fever, chills, nausea, vomiting, dysuria, diarrhea, swelling, rash, and focal weakness or numbness. She is unable to tell me why she is unable to ambulate other than saying that she feels weak. She denies any dizziness or vertigo. The paramedics found the patient's blood sugar to be just over 300, she was in a atrial fibrillation with a rate of 80 and required increased oxygen   Patient is a 80 y.o. female presenting with weakness. The history is provided by the patient, the EMS personnel and medical records.  Weakness    Past Medical History  Diagnosis Date  . AV block, complete   . CHF (congestive heart failure)   . AAA (abdominal aortic aneurysm)   . Dyslipidemia   . History of colonic polyps   . Anxiety and depression   . Diverticulosis of colon   . Asthma   . Acute renal failure   . Atrial flutter   . Hyperglycemia   . UTI (lower urinary tract infection)   . COPD (chronic  obstructive pulmonary disease)   . HTN (hypertension)   . DM (diabetes mellitus)   . GERD (gastroesophageal reflux disease)   . DVT (deep venous thrombosis)   . B12 deficiency   . Angiodysplasia of intestine (without mention of hemorrhage)    Past Surgical History  Procedure Laterality Date  . Tubal ligation    . Abdominal hysterectomy    . Appendectomy    . Pacemaker insertion      medtronic   . Colonoscopy  02/06/2002    244.01,027.25  . Bladder suspension     Family History  Problem Relation Age of Onset  . Emphysema Maternal Uncle     multiple   . Asthma Daughter   . Colon cancer Sister     alive at 57   . Heart disease Father   . CVA Mother   . Heart disease Mother    History  Substance Use Topics  . Smoking status: Former Smoker    Quit date: 08/23/1988  . Smokeless tobacco: Never Used     Comment: quit in 1990, smoked 1 ppd x 30 years   . Alcohol Use: No   OB History   Grav Para Term Preterm Abortions TAB SAB Ect Mult Living                 Review of Systems  Neurological: Positive for weakness.  All other systems reviewed and are negative.     Allergies  Advair diskus; Biaxin; Ciprofloxacin; Doxycycline; Keflex; Ketek; Levaquin;  Macrobid; Penicillins; and Sulfonamide derivatives  Home Medications   Prior to Admission medications   Medication Sig Start Date End Date Taking? Authorizing Provider  albuterol (VENTOLIN HFA) 108 (90 BASE) MCG/ACT inhaler Inhale 2 puffs into the lungs every 6 (six) hours as needed. For shortness of breath. 02/14/14  Yes Lucky CowboyWilliam McKeown, MD  aspirin 81 MG EC tablet Take 81 mg by mouth daily.     Yes Historical Provider, MD  Cholecalciferol (VITAMIN D) 2000 UNITS tablet Take 5,000 Units by mouth daily.    Yes Historical Provider, MD  dexlansoprazole (DEXILANT) 60 MG capsule Take 60 mg by mouth daily.   Yes Historical Provider, MD  furosemide (LASIX) 40 MG tablet Take 40 mg by mouth daily.   Yes Historical Provider, MD   montelukast (SINGULAIR) 10 MG tablet Take 10 mg by mouth at bedtime.   Yes Historical Provider, MD  PARoxetine (PAXIL) 20 MG tablet Take 20 mg by mouth daily.  10/19/13  Yes Historical Provider, MD  predniSONE (DELTASONE) 10 MG tablet Take 10 mg by mouth daily with breakfast.   Yes Historical Provider, MD  simvastatin (ZOCOR) 80 MG tablet Take 40 mg by mouth every Monday, Wednesday, and Friday.  02/24/14  Yes Historical Provider, MD  vitamin B-12 (CYANOCOBALAMIN) 1000 MCG tablet Take 1,000 mcg by mouth daily.   Yes Historical Provider, MD  warfarin (COUMADIN) 5 MG tablet Take 5 mg by mouth daily.   Yes Historical Provider, MD  ONE TOUCH ULTRA TEST test strip USE TO CHECK BLOOD GLUCOSE ONCE A DAY AS INSTRUCTED 05/19/14   Lucky CowboyWilliam McKeown, MD   BP 107/45  Pulse 70  Temp(Src) 98.7 F (37.1 C) (Oral)  Resp 23  Ht 5\' 4"  (1.626 m)  Wt 183 lb (83.008 kg)  BMI 31.40 kg/m2  SpO2 87% Physical Exam  Nursing note and vitals reviewed. Constitutional: She appears well-developed and well-nourished. She appears distressed.  HENT:  Head: Normocephalic and atraumatic.  Mouth/Throat: No oropharyngeal exudate.  Mucous membranes dehydrated  Eyes: Conjunctivae and EOM are normal. Pupils are equal, round, and reactive to light. Right eye exhibits no discharge. Left eye exhibits no discharge. No scleral icterus.  Neck: Normal range of motion. Neck supple. No JVD present. No thyromegaly present.  Cardiovascular: Normal rate, normal heart sounds and intact distal pulses.  Exam reveals no gallop and no friction rub.   No murmur heard. Slight irregular rhythm, no JVD  Pulmonary/Chest: She is in respiratory distress. She has wheezes. She has no rales.  Diffuse expiratory wheezing, tachypnea, decreased breath sounds at the right base  Abdominal: Soft. Bowel sounds are normal. She exhibits no distension and no mass. There is no tenderness.  Musculoskeletal: Normal range of motion. She exhibits no edema and no  tenderness.  Lymphadenopathy:    She has no cervical adenopathy.  Neurological: She is alert. Coordination normal.  Skin: Skin is warm and dry. No rash noted. No erythema.  Psychiatric: She has a normal mood and affect. Her behavior is normal.    ED Course  Procedures (including critical care time) Labs Review Labs Reviewed  CBC WITH DIFFERENTIAL - Abnormal; Notable for the following:    Platelets 103 (*)    Neutrophils Relative % 80 (*)    Lymphocytes Relative 10 (*)    All other components within normal limits  HEPATIC FUNCTION PANEL - Abnormal; Notable for the following:    Albumin 3.4 (*)    All other components within normal limits  URINALYSIS, ROUTINE W REFLEX  MICROSCOPIC - Abnormal; Notable for the following:    APPearance CLOUDY (*)    Hgb urine dipstick TRACE (*)    Ketones, ur 15 (*)    Protein, ur 30 (*)    Leukocytes, UA SMALL (*)    All other components within normal limits  PROTIME-INR - Abnormal; Notable for the following:    Prothrombin Time 24.2 (*)    INR 2.17 (*)    All other components within normal limits  I-STAT TROPOININ, ED - Abnormal; Notable for the following:    Troponin i, poc 0.40 (*)    All other components within normal limits  I-STAT CHEM 8, ED - Abnormal; Notable for the following:    BUN 29 (*)    Creatinine, Ser 2.50 (*)    Glucose, Bld 222 (*)    Calcium, Ion 1.12 (*)    All other components within normal limits  I-STAT ARTERIAL BLOOD GAS, ED - Abnormal; Notable for the following:    pCO2 arterial 48.5 (*)    pO2, Arterial 66.0 (*)    Bicarbonate 29.2 (*)    Acid-Base Excess 3.0 (*)    All other components within normal limits  URINE MICROSCOPIC-ADD ON  BLOOD GAS, ARTERIAL  TROPONIN I    Imaging Review Dg Chest Port 1 View  06/01/2014   CLINICAL DATA:  Shortness of breath  EXAM: PORTABLE CHEST - 1 VIEW  COMPARISON:  December 09, 2013  FINDINGS: The heart size and mediastinal contours are stable. The heart size is enlarged. Cardiac  pacemaker is unchanged. There are mild atelectasis of the right upper lobe and left mid to lower lung. There is no focal pneumonia, pulmonary edema, or pleural effusion. The visualized skeletal structures are stable.  IMPRESSION: Mild atelectasis of both lungs as described. No focal pneumonia or pulmonary edema.   Electronically Signed   By: Sherian Rein M.D.   On: 06/01/2014 17:54     EKG Interpretation   Date/Time:  Saturday June 01 2014 16:36:54 EDT Ventricular Rate:  71 PR Interval:  37 QRS Duration: 128 QT Interval:  439 QTC Calculation: 477 R Axis:   -99 Text Interpretation:  Sinus rhythm Short PR interval Right bundle branch  block Inferior infarct, old Probable anterolateral infarct, age indeterm  since last tracing no significant change Confirmed by Dastan Krider  MD, Brayleigh Rybacki  [phone]) on 06/01/2014 4:44:24 PM      MDM   Final diagnoses:  SOB (shortness of breath)  NSTEMI (non-ST elevated myocardial infarction)  Respiratory distress    The patient has signs of COPD, unlikely be CHF given no signs of fluid overload though cardiac wheeze maybe etiology. Metal status is normal at this time, she is able to perform all activities I after showing no focal weakness or discoordination. Check INR, chest x-ray, continuous neb, labs. EKG shows no signs of pacing, no signs of ischemia.  Troponin is elevated, ABG shows hypoxia, no significant hypercapnia or acidosis at this time. Continuous nebulizer treatment given with good improvement. Discussed with the hospitalist to admit to the hospital. Suspect non-ST elevation MI likely related to oxygen demand and demand ischemia.  CRITICAL CARE Performed by: Vida Roller Total critical care time: 35 Critical care time was exclusive of separately billable procedures and treating other patients. Critical care was necessary to treat or prevent imminent or life-threatening deterioration. Critical care was time spent personally by me on the  following activities: development of treatment plan with patient and/or surrogate as well as nursing, discussions  with consultants, evaluation of patient's response to treatment, examination of patient, obtaining history from patient or surrogate, ordering and performing treatments and interventions, ordering and review of laboratory studies, ordering and review of radiographic studies, pulse oximetry and re-evaluation of patient's condition.   Meds given in ED:  Medications  albuterol (PROVENTIL,VENTOLIN) solution continuous neb (0 mg/hr Nebulization Stopped 06/01/14 1818)  heparin bolus via infusion 4,000 Units (not administered)  heparin ADULT infusion 100 units/mL (25000 units/250 mL) (not administered)  methylPREDNISolone sodium succinate (SOLU-MEDROL) 125 mg/2 mL injection 125 mg (125 mg Intravenous Given 06/01/14 1655)  aspirin chewable tablet 324 mg (324 mg Oral Given 06/01/14 1949)    New Prescriptions   No medications on file        Vida RollerBrian D Nayanna Seaborn, MD 06/01/14 2010

## 2014-06-01 NOTE — ED Notes (Signed)
Admitting made aware of troponin

## 2014-06-01 NOTE — ED Notes (Signed)
Dr Hyacinth MeekerMiller given a copy of troponin results .40

## 2014-06-01 NOTE — Progress Notes (Signed)
ANTICOAGULATION CONSULT NOTE - Initial Consult  Pharmacy Consult for Coumadin Indication: atrial fibrillation  Allergies  Allergen Reactions  . Advair Diskus [Fluticasone-Salmeterol]     Itching  . Biaxin [Clarithromycin]     N/V  . Ciprofloxacin     N/V  . Doxycycline Other (See Comments)    Inside of mouth and tongue red and peeled  . Keflex [Cephalexin]     N/V  . Ketek [Telithromycin]     N/V  . Levaquin [Levofloxacin In D5w]     N/V  . Macrobid [Nitrofurantoin]     N/V  . Penicillins     Rash  . Sulfonamide Derivatives     Patient Measurements: Height: 5\' 4"  (162.6 cm) Weight: 183 lb (83.008 kg) IBW/kg (Calculated) : 54.7  Vital Signs: Temp: 98.7 F (37.1 C) (10/10 1639) Temp Source: Oral (10/10 1639) BP: 104/49 mmHg (10/10 2200) Pulse Rate: 69 (10/10 2200)  Labs:  Recent Labs  06/01/14 1649 06/01/14 1656 06/01/14 2016  HGB 12.0 13.3  --   HCT 39.5 39.0  --   PLT 103*  --   --   LABPROT 24.2*  --   --   INR 2.17*  --   --   CREATININE  --  2.50*  --   TROPONINI  --   --  0.75*    Estimated Creatinine Clearance: 18.7 ml/min (by C-G formula based on Cr of 2.5).   Medical History: Past Medical History  Diagnosis Date  . AV block, complete   . CHF (congestive heart failure)   . AAA (abdominal aortic aneurysm)   . Dyslipidemia   . History of colonic polyps   . Anxiety and depression   . Diverticulosis of colon   . Asthma   . Acute renal failure   . Atrial flutter   . Hyperglycemia   . UTI (lower urinary tract infection)   . COPD (chronic obstructive pulmonary disease)   . HTN (hypertension)   . DM (diabetes mellitus)   . GERD (gastroesophageal reflux disease)   . DVT (deep venous thrombosis)   . B12 deficiency   . Angiodysplasia of intestine (without mention of hemorrhage)     Assessment: 80yo female c/o intermittent generalized weakness x3d, admitted for COPD exacerbation, to continue Coumadin for Afib; current INR within goal  range.  Goal of Therapy:  INR 2-3   Plan:  Will continue home Coumadin dose of 2.5mg  daily and monitor INR for dose adjustments.  Vernard GamblesVeronda Jariya Reichow, PharmD, BCPS  06/01/2014,10:17 PM

## 2014-06-01 NOTE — ED Notes (Signed)
Per EMS pt from home 3 days ago pt had generalized weakness and was unable to walk. sts it resolved and today she had the episode of generalized weakness today and felt unable to walk. Negative stroke screen. Pt able to walk to stretcher with EMS assistance.

## 2014-06-02 ENCOUNTER — Encounter (HOSPITAL_COMMUNITY): Payer: Self-pay | Admitting: Emergency Medicine

## 2014-06-02 DIAGNOSIS — R7989 Other specified abnormal findings of blood chemistry: Secondary | ICD-10-CM

## 2014-06-02 DIAGNOSIS — J9600 Acute respiratory failure, unspecified whether with hypoxia or hypercapnia: Secondary | ICD-10-CM

## 2014-06-02 DIAGNOSIS — E118 Type 2 diabetes mellitus with unspecified complications: Secondary | ICD-10-CM

## 2014-06-02 DIAGNOSIS — I442 Atrioventricular block, complete: Secondary | ICD-10-CM

## 2014-06-02 DIAGNOSIS — Z7901 Long term (current) use of anticoagulants: Secondary | ICD-10-CM

## 2014-06-02 DIAGNOSIS — I5032 Chronic diastolic (congestive) heart failure: Secondary | ICD-10-CM

## 2014-06-02 DIAGNOSIS — Z950 Presence of cardiac pacemaker: Secondary | ICD-10-CM

## 2014-06-02 LAB — GLUCOSE, CAPILLARY
GLUCOSE-CAPILLARY: 263 mg/dL — AB (ref 70–99)
Glucose-Capillary: 268 mg/dL — ABNORMAL HIGH (ref 70–99)
Glucose-Capillary: 162 mg/dL — ABNORMAL HIGH (ref 70–99)
Glucose-Capillary: 228 mg/dL — ABNORMAL HIGH (ref 70–99)

## 2014-06-02 LAB — PROTIME-INR
INR: 2.06 — ABNORMAL HIGH (ref 0.00–1.49)
PROTHROMBIN TIME: 23.2 s — AB (ref 11.6–15.2)

## 2014-06-02 LAB — COMPREHENSIVE METABOLIC PANEL
ALT: 10 U/L (ref 0–35)
ANION GAP: 26 — AB (ref 5–15)
AST: 31 U/L (ref 0–37)
Albumin: 3.2 g/dL — ABNORMAL LOW (ref 3.5–5.2)
Alkaline Phosphatase: 64 U/L (ref 39–117)
BUN: 32 mg/dL — AB (ref 6–23)
CALCIUM: 8.6 mg/dL (ref 8.4–10.5)
CO2: 20 mEq/L (ref 19–32)
Chloride: 91 mEq/L — ABNORMAL LOW (ref 96–112)
Creatinine, Ser: 2.66 mg/dL — ABNORMAL HIGH (ref 0.50–1.10)
GFR calc non Af Amer: 16 mL/min — ABNORMAL LOW (ref >90)
GFR, EST AFRICAN AMERICAN: 18 mL/min — AB (ref >90)
Glucose, Bld: 400 mg/dL — ABNORMAL HIGH (ref 70–99)
POTASSIUM: 4.2 meq/L (ref 3.7–5.3)
Sodium: 137 mEq/L (ref 137–147)
TOTAL PROTEIN: 6.6 g/dL (ref 6.0–8.3)
Total Bilirubin: 0.3 mg/dL (ref 0.3–1.2)

## 2014-06-02 LAB — CBC
HEMATOCRIT: 37.8 % (ref 36.0–46.0)
HEMOGLOBIN: 11.8 g/dL — AB (ref 12.0–15.0)
MCH: 31.9 pg (ref 26.0–34.0)
MCHC: 31.2 g/dL (ref 30.0–36.0)
MCV: 102.2 fL — ABNORMAL HIGH (ref 78.0–100.0)
Platelets: 150 10*3/uL (ref 150–400)
RBC: 3.7 MIL/uL — AB (ref 3.87–5.11)
RDW: 15.8 % — ABNORMAL HIGH (ref 11.5–15.5)
WBC: 7.5 10*3/uL (ref 4.0–10.5)

## 2014-06-02 LAB — TSH: TSH: 0.409 u[IU]/mL (ref 0.350–4.500)

## 2014-06-02 LAB — CREATININE, URINE, RANDOM: CREATININE, URINE: 143.29 mg/dL

## 2014-06-02 LAB — TROPONIN I
TROPONIN I: 0.48 ng/mL — AB (ref <0.30)
Troponin I: <0.3 ng/mL (ref <0.30)

## 2014-06-02 LAB — MRSA PCR SCREENING: MRSA by PCR: NEGATIVE

## 2014-06-02 LAB — CBG MONITORING, ED: Glucose-Capillary: 307 mg/dL — ABNORMAL HIGH (ref 70–99)

## 2014-06-02 LAB — PHOSPHORUS: Phosphorus: 5.1 mg/dL — ABNORMAL HIGH (ref 2.3–4.6)

## 2014-06-02 LAB — MAGNESIUM: MAGNESIUM: 2.3 mg/dL (ref 1.5–2.5)

## 2014-06-02 LAB — PRO B NATRIURETIC PEPTIDE: PRO B NATRI PEPTIDE: 13998 pg/mL — AB (ref 0–450)

## 2014-06-02 LAB — SODIUM, URINE, RANDOM: SODIUM UR: 68 meq/L

## 2014-06-02 NOTE — ED Notes (Signed)
A hospital bed has been requested

## 2014-06-02 NOTE — ED Notes (Signed)
The pt is still on a non-rebreather mask

## 2014-06-02 NOTE — ED Notes (Signed)
Pt asked for head of bed to be lowered

## 2014-06-02 NOTE — Plan of Care (Signed)
Problem: ICU Phase Progression Outcomes Goal: Flu/PneumoVaccines if indicated Outcome: Completed/Met Date Met:  06/02/14 Pt refuses flu vaccine, hasnt had one in 40 years.  Etta Quill

## 2014-06-02 NOTE — ED Notes (Signed)
Report called to rn  On 2900

## 2014-06-02 NOTE — Progress Notes (Signed)
Echo Lab  2D Echocardiogram completed.  Larkin Alfred L Carmin Alvidrez, RDCS 06/02/2014 11:01 AM

## 2014-06-02 NOTE — Progress Notes (Signed)
[facility]                                                                              Patient Demographics  [name], is a 80 y.o. female, DOB - 01-03-1932, ZOX:[id number]  Admit date - 06/01/2014   Admitting Physician Therisa DoyneAnastassia Doutova, MD  Outpatient Primary MD for the patient is [name],[name], MD  LOS - 1   Chief Complaint  Patient presents with  . Weakness      HPI on 06/01/2014 80 year old female with past medical history of CHF, atrial fibrillation, diabetes mellitus, hypertension, COPD that presented to the emergency department with a history of worsening shortness of breath as well as fever for one week. Patient's fever did go to 101F however resolved with Tylenol. Patient initially was improving at home however over the next few days, she developed diarrhea which lasted 24 hours and has since resolved. Patient has history of COPD and asthma for which she sees Dr. Sherene SiresWert, and is maintained on daily prednisone 10 mg per day. Patient also uses 5 L of oxygen at home, her family did try to increase her to 6 L however this did not help her shortness of breath. She presented with worsening short of breath to the emergency department and required continuous nebulizer treatment. Patient was also found to have increased creatinine up to 2.7 as well as an elevated troponin. She denied chest pain upon admission.  Assessment & Plan   Acute on chronic respiratory failure possibly secondary to COPD exacerbation versus CHF -Patient currently on nonrebreather, will attempt to wean to nasal cannula. -Continue DuoNeb treatments, Solu-Medrol, dulera, guaifenesin -Will place patient on IV Levaquin and discontinue azithromycin and ceftriaxone. -Patient did have an elevated BNP of 14,000 upon admission, however clinically patient does not appear to be volume overloaded. -Chest x-ray showed atelectasis in both lungs, no pneumonia or pulmonary edema  Elevated troponin likely  demand ischemia -Patient was noted to have an elevated 0.7 upon admission however this isn't intended in this come down to less than 0.3. -Pending echocardiogram -Patient currently is chest pain free. No EKG changes noted. -Case was discussed by the admitting physician and cardiology. -Pending results of echocardiogram, May consult cardiology.  Acute on chronic kidney disease, stage 3 -Possibly secondary to dehydration versus diuretics -Lasix currently held, patient received gentle IV hydration, which will be discontinued -Will continue to monitor patient's urinary output as well as BMP  Chronic diastolic heart failure -Echocardiogram in April 2015 shows EF of 55-60%, unable to evaluate diastolic dysfunction -Echocardiogram pending -Patient did have an elevated BNP upon admission however appears to be euvolemic at this time -Will continue to monitor her daily weights, intake and output.  Brief episode of diarrhea -Resolved  Hypertension -Currently stable, Lasix held -Will add on medications if needed  Atrial fibrillation -Currently rate controlled, on no medications, has pacemaker -Continue Coumadin per pharmacy for anticoagulation  History of complete heart block -Stable, patient has pacemaker in place  Hyperglycemia in the setting of diet controlled diabetes -Patient's last hemoglobin A1c was 7.4 in July 2015 -Continue insulin scale with CBG monitoring -Likely elevated secondary to Solu-Medrol  Hyperlipidemia -Continue statin  Code Status: DO  NOT RESUSCITATE  Family Communication: None at bedside  Disposition Plan: Admitted  Time Spent in minutes   30 minutes  Procedures  Echocardiogram  Consults   None  DVT Prophylaxis  Coumadin  Lab Results  Component Value Date   PLT 150 06/02/2014    Medications  Scheduled Meds: . aspirin EC  81 mg Oral Daily  . atorvastatin  40 mg Oral q1800  . azithromycin  500 mg Intravenous Q24H  . cefTRIAXone (ROCEPHIN)  IVPB 1 gram/50 mL D5W  1 g Intravenous Q24H  . guaiFENesin  600 mg Oral BID  . insulin aspart  0-15 Units Subcutaneous TID WC  . insulin aspart  0-5 Units Subcutaneous QHS  . ipratropium  0.5 mg Nebulization Q6H  . methylPREDNISolone (SOLU-MEDROL) injection  60 mg Intravenous Q12H  . mometasone-formoterol  2 puff Inhalation BID  . montelukast  10 mg Oral QHS  . pantoprazole  40 mg Oral Q1200  . PARoxetine  20 mg Oral Daily  . sodium chloride  3 mL Intravenous Q12H  . warfarin  2.5 mg Oral q1800  . Warfarin - Pharmacist Dosing Inpatient   Does not apply q1800   Continuous Infusions:  PRN Meds:.acetaminophen, acetaminophen, HYDROcodone-acetaminophen, ondansetron (ZOFRAN) IV, ondansetron  Antibiotics    Anti-infectives   Start     Dose/Rate Route Frequency Ordered Stop   06/01/14 2215  cefTRIAXone (ROCEPHIN) 1 g in dextrose 5 % 50 mL IVPB     1 g 100 mL/hr over 30 Minutes Intravenous Every 24 hours 06/01/14 2211     06/01/14 2215  azithromycin (ZITHROMAX) 500 mg in dextrose 5 % 250 mL IVPB     500 mg 250 mL/hr over 60 Minutes Intravenous Every 24 hours 06/01/14 2211        Subjective:   [name] seen and examined today.  Patient states her breathing has improved since coming to the hospital. She denies any chest pain, dizziness, headache, abdominal pain, nausea, vomiting, further diarrhea.  Objective:   Filed Vitals:   06/02/14 0424 06/02/14 0650 06/02/14 0700 06/02/14 0812  BP: 103/63 123/89 128/67 123/60  Pulse: 70 72 70 78  Temp: 97.8 F (36.6 C) 97.4 F (36.3 C)  98.3 F (36.8 C)  TempSrc:  Oral  Oral  Resp: 22 33 19 22  Height:  5\' 3"  (1.6 m)    Weight:  84.369 kg (186 lb)    SpO2: 98% 100% 99% 90%    Wt Readings from Last 3 Encounters:  06/02/14 84.369 kg (186 lb)  03/14/14 85.276 kg (188 lb)  01/07/14 83.462 kg (184 lb)     Intake/Output Summary (Last 24 hours) at 06/02/14 1029 Last data filed at 06/02/14 0900  Gross per 24 hour  Intake 331.67 ml   Output      0 ml  Net 331.67 ml    Exam  General: Well developed, well nourished, NAD, appears stated age  HEENT: NCAT,  mucous membranes moist. Nonrebreather in place.  Cardiovascular: S1 S2 auscultated, no rubs, murmurs or gallops. Regular rate and rhythm.  Respiratory: Clear to auscultation bilaterally with equal chest rise  Abdomen: Soft, nontender, nondistended, + bowel sounds  Extremities: warm dry without cyanosis clubbing or edema  Neuro: AAOx3, no focal deficits  Psych: Normal affect and demeanor with intact judgement and insight  Data Review   Micro Results Recent Results (from the past 240 hour(s))  MRSA PCR SCREENING     Status: None   Collection Time  06/02/14  6:59 AM      Result Value Ref Range Status   MRSA by PCR NEGATIVE  NEGATIVE Final   Comment:            The GeneXpert MRSA Assay (FDA     approved for NASAL specimens     only), is one component of a     comprehensive MRSA colonization     surveillance program. It is not     intended to diagnose MRSA     infection nor to guide or     monitor treatment for     MRSA infections.    Radiology Reports Dg Chest Port 1 View  06/01/2014   CLINICAL DATA:  Shortness of breath  EXAM: PORTABLE CHEST - 1 VIEW  COMPARISON:  December 09, 2013  FINDINGS: The heart size and mediastinal contours are stable. The heart size is enlarged. Cardiac pacemaker is unchanged. There are mild atelectasis of the right upper lobe and left mid to lower lung. There is no focal pneumonia, pulmonary edema, or pleural effusion. The visualized skeletal structures are stable.  IMPRESSION: Mild atelectasis of both lungs as described. No focal pneumonia or pulmonary edema.   Electronically Signed   By: Sherian ReinWei-Chen  Lin M.D.   On: 06/01/2014 17:54    CBC  Recent Labs Lab 06/01/14 1649 06/01/14 1656 06/02/14  WBC 8.3  --  7.5  HGB 12.0 13.3 11.8*  HCT 39.5 39.0 37.8  PLT 103*  --  150  MCV 98.8  --  102.2*  MCH 30.0  --  31.9    MCHC 30.4  --  31.2  RDW 15.2  --  15.8*  LYMPHSABS 0.8  --   --   MONOABS 0.8  --   --   EOSABS 0.0  --   --   BASOSABS 0.0  --   --     Chemistries   Recent Labs Lab 06/01/14 1649 06/01/14 1656 06/02/14  NA  --  139 137  K  --  3.9 4.2  CL  --  101 91*  CO2  --   --  20  GLUCOSE  --  222* 400*  BUN  --  29* 32*  CREATININE  --  2.50* 2.66*  CALCIUM  --   --  8.6  MG  --   --  2.3  AST 32  --  31  ALT 11  --  10  ALKPHOS 67  --  64  BILITOT 0.7  --  0.3   ------------------------------------------------------------------------------------------------------------------ estimated creatinine clearance is 17.4 ml/min (by C-G formula based on Cr of 2.66). ------------------------------------------------------------------------------------------------------------------ No results found for this basename: HGBA1C,  in the last 72 hours ------------------------------------------------------------------------------------------------------------------ No results found for this basename: CHOL, HDL, LDLCALC, TRIG, CHOLHDL, LDLDIRECT,  in the last 72 hours ------------------------------------------------------------------------------------------------------------------  Recent Labs  06/02/14  TSH 0.409   ------------------------------------------------------------------------------------------------------------------ No results found for this basename: VITAMINB12, FOLATE, FERRITIN, TIBC, IRON, RETICCTPCT,  in the last 72 hours  Coagulation profile  Recent Labs Lab 05/28/14 1303 06/01/14 1649 06/02/14  INR 2.7 2.17* 2.06*    No results found for this basename: DDIMER,  in the last 72 hours  Cardiac Enzymes  Recent Labs Lab 06/01/14 2016 06/02/14 06/02/14 0600  TROPONINI 0.75* 0.48* <0.30   ------------------------------------------------------------------------------------------------------------------ No components found with this basename: POCBNP,     Elaysha Bevard,  Oneta Sigman D.O. on 06/02/2014 at 10:29 AM  Between 7am to 7pm - Pager - [phone]  After 7pm go  to [email or web address] - password [email or web address]  And look for the night coverage person covering for me after hours  [facility] Office  [phone]

## 2014-06-02 NOTE — ED Notes (Signed)
The pt has been   Sleeping for the pat 2 hours.  Her sats are staying around 97 on the nonrebreather

## 2014-06-02 NOTE — ED Notes (Signed)
Pt moved to a regular hospital bed for comfort.  Awaken during the move.  No complaints.  Iv site unremarkable.  Retaped.

## 2014-06-02 NOTE — ED Notes (Signed)
Pt sleeping. 

## 2014-06-02 NOTE — ED Notes (Signed)
The pt report that she  feels ok

## 2014-06-02 NOTE — Progress Notes (Signed)
ANTICOAGULATION CONSULT NOTE   Pharmacy Consult for Coumadin Indication: atrial fibrillation  Patient Measurements: Height: 5\' 3"  (160 cm) Weight: 186 lb (84.369 kg) IBW/kg (Calculated) : 52.4  Vital Signs: Temp: 97.4 F (36.3 C) (10/11 0650) Temp Source: Oral (10/11 0650) BP: 128/67 mmHg (10/11 0700) Pulse Rate: 70 (10/11 0700)  Labs:  Recent Labs  06/01/14 1649 06/01/14 1656 06/01/14 2016 06/02/14 06/02/14 0600  HGB 12.0 13.3  --  11.8*  --   HCT 39.5 39.0  --  37.8  --   PLT 103*  --   --  150  --   LABPROT 24.2*  --   --  23.2*  --   INR 2.17*  --   --  2.06*  --   CREATININE  --  2.50*  --  2.66*  --   TROPONINI  --   --  0.75* 0.48* <0.30    Estimated Creatinine Clearance: 17.4 ml/min (by C-G formula based on Cr of 2.66).   Medical History: Past Medical History  Diagnosis Date  . AV block, complete   . CHF (congestive heart failure)   . AAA (abdominal aortic aneurysm)   . Dyslipidemia   . History of colonic polyps   . Anxiety and depression   . Diverticulosis of colon   . Asthma   . Acute renal failure   . Atrial flutter   . Hyperglycemia   . UTI (lower urinary tract infection)   . COPD (chronic obstructive pulmonary disease)   . HTN (hypertension)   . DM (diabetes mellitus)   . GERD (gastroesophageal reflux disease)   . DVT (deep venous thrombosis)   . B12 deficiency   . Angiodysplasia of intestine (without mention of hemorrhage)     Assessment: 80yo female c/o intermittent generalized weakness x3d, admitted for COPD exacerbation, to continue Coumadin for Afib; current INR within goal range without bleeding complications.  Goal of Therapy:  INR 2-3   Plan:  Will continue home Coumadin dose of 2.5mg  daily and monitor INR for dose adjustments.  Sheppard CoilFrank Wilson PharmD., BCPS Clinical Pharmacist Pager [phone] 06/02/2014 8:34 AM

## 2014-06-02 NOTE — Progress Notes (Signed)
Utilization Review Completed.   Benjamen Koelling, RN, BSN Nurse Case Manager  

## 2014-06-03 ENCOUNTER — Inpatient Hospital Stay (HOSPITAL_COMMUNITY): Payer: Medicare Other

## 2014-06-03 DIAGNOSIS — K219 Gastro-esophageal reflux disease without esophagitis: Secondary | ICD-10-CM

## 2014-06-03 DIAGNOSIS — I482 Chronic atrial fibrillation: Secondary | ICD-10-CM

## 2014-06-03 DIAGNOSIS — R0600 Dyspnea, unspecified: Secondary | ICD-10-CM

## 2014-06-03 DIAGNOSIS — I5022 Chronic systolic (congestive) heart failure: Secondary | ICD-10-CM

## 2014-06-03 LAB — BASIC METABOLIC PANEL
Anion gap: 13 (ref 5–15)
BUN: 46 mg/dL — ABNORMAL HIGH (ref 6–23)
CO2: 28 mEq/L (ref 19–32)
Calcium: 9.1 mg/dL (ref 8.4–10.5)
Chloride: 96 mEq/L (ref 96–112)
Creatinine, Ser: 2.25 mg/dL — ABNORMAL HIGH (ref 0.50–1.10)
GFR calc non Af Amer: 19 mL/min — ABNORMAL LOW (ref >90)
GFR, EST AFRICAN AMERICAN: 23 mL/min — AB (ref >90)
GLUCOSE: 263 mg/dL — AB (ref 70–99)
Potassium: 4.3 mEq/L (ref 3.7–5.3)
SODIUM: 137 meq/L (ref 137–147)

## 2014-06-03 LAB — GLUCOSE, CAPILLARY
GLUCOSE-CAPILLARY: 187 mg/dL — AB (ref 70–99)
Glucose-Capillary: 204 mg/dL — ABNORMAL HIGH (ref 70–99)
Glucose-Capillary: 226 mg/dL — ABNORMAL HIGH (ref 70–99)
Glucose-Capillary: 261 mg/dL — ABNORMAL HIGH (ref 70–99)

## 2014-06-03 LAB — CBC
HCT: 34.4 % — ABNORMAL LOW (ref 36.0–46.0)
HEMOGLOBIN: 10.8 g/dL — AB (ref 12.0–15.0)
MCH: 30.4 pg (ref 26.0–34.0)
MCHC: 31.4 g/dL (ref 30.0–36.0)
MCV: 96.9 fL (ref 78.0–100.0)
Platelets: 120 10*3/uL — ABNORMAL LOW (ref 150–400)
RBC: 3.55 MIL/uL — ABNORMAL LOW (ref 3.87–5.11)
RDW: 15.1 % (ref 11.5–15.5)
WBC: 14.6 10*3/uL — ABNORMAL HIGH (ref 4.0–10.5)

## 2014-06-03 LAB — PROTIME-INR
INR: 2.91 — AB (ref 0.00–1.49)
PROTHROMBIN TIME: 30.4 s — AB (ref 11.6–15.2)

## 2014-06-03 MED ORDER — BUDESONIDE 0.25 MG/2ML IN SUSP
0.2500 mg | Freq: Four times a day (QID) | RESPIRATORY_TRACT | Status: DC
  Filled 2014-06-03 (×4): qty 2

## 2014-06-03 MED ORDER — METOPROLOL TARTRATE 25 MG PO TABS: 25.0000 mg | ORAL_TABLET | Freq: Two times a day (BID) | ORAL | Status: DC

## 2014-06-03 MED ORDER — LEVOFLOXACIN 750 MG PO TABS
750.0000 mg | ORAL_TABLET | Freq: Once | ORAL | Status: AC
  Administered 2014-06-03: 750 mg via ORAL
  Filled 2014-06-03: qty 1

## 2014-06-03 MED ORDER — BUDESONIDE 0.25 MG/2ML IN SUSP
0.2500 mg | Freq: Four times a day (QID) | RESPIRATORY_TRACT | Status: DC
  Administered 2014-06-03 – 2014-06-07 (×15): 0.25 mg via RESPIRATORY_TRACT
  Filled 2014-06-03 (×19): qty 2

## 2014-06-03 MED ORDER — IPRATROPIUM-ALBUTEROL 0.5-2.5 (3) MG/3ML IN SOLN
3.0000 mL | RESPIRATORY_TRACT | Status: DC
  Administered 2014-06-03 – 2014-06-04 (×3): 3 mL via RESPIRATORY_TRACT
  Filled 2014-06-03 (×3): qty 3

## 2014-06-03 MED ORDER — FUROSEMIDE 10 MG/ML IJ SOLN
40.0000 mg | Freq: Once | INTRAMUSCULAR | Status: AC
  Administered 2014-06-03: 40 mg via INTRAVENOUS
  Filled 2014-06-03: qty 4

## 2014-06-03 MED ORDER — WARFARIN 0.5 MG HALF TABLET
0.5000 mg | ORAL_TABLET | Freq: Once | ORAL | Status: AC
  Administered 2014-06-03: 0.5 mg via ORAL
  Filled 2014-06-03: qty 1

## 2014-06-03 MED ORDER — IPRATROPIUM BROMIDE 0.02 % IN SOLN
0.5000 mg | Freq: Three times a day (TID) | RESPIRATORY_TRACT | Status: DC
  Administered 2014-06-03: 0.5 mg via RESPIRATORY_TRACT
  Filled 2014-06-03: qty 2.5

## 2014-06-03 MED ORDER — LEVOFLOXACIN 500 MG PO TABS: 500.0000 mg | ORAL_TABLET | ORAL | Status: DC

## 2014-06-03 NOTE — Consult Note (Signed)
Name: [name] MRN: [id number] DOB: 08/26/1931    ADMISSION DATE:  06/01/2014 CONSULTATION DATE:  06/03/14   REFERRING MD :  Catha Gosselin   CHIEF COMPLAINT:  Hypoxia, COPD   BRIEF PATIENT DESCRIPTION:  80yo female with complex PMH including AFib, DM, HTN, CHF, COPD (5L home O2, chronic pred), CKD IV presented 10/10 with 1 week hx SOB and fever.  Admitted by [facility] with acute on chronic resp failure and acute on chronic kidney failure.  Cont to have worsening hypoxia requiring NRB and PCCM consulted 10/12.   SIGNIFICANT EVENTS    STUDIES:  2D echo 10/11>>> EF45%, hypokinesis entire LV and RV apex, ?takotsubo    HISTORY OF PRESENT ILLNESS:  80yo female with complex PMH including AFib, DM, HTN, CHF, COPD (5L home O2, chronic pred), CKD3 presented 10/10 with 1 week hx SOB and fever.  Admitted by [facility] with acute on chronic resp failure and acute on chronic kidney failure.  Cont to have worsening hypoxia requiring NRB and PCCM consulted 10/12.  Currently comfortable on NRB.  Denies SOB above baseline. Denies chest pain, leg/calf pain, hemoptysis, purulent sputum.    PAST MEDICAL HISTORY :  Past Medical History  Diagnosis Date  . AV block, complete   . CHF (congestive heart failure)   . AAA (abdominal aortic aneurysm)   . Dyslipidemia   . History of colonic polyps   . Anxiety and depression   . Diverticulosis of colon   . Asthma   . Acute renal failure   . Atrial flutter   . Hyperglycemia   . UTI (lower urinary tract infection)   . COPD (chronic obstructive pulmonary disease)   . HTN (hypertension)   . DM (diabetes mellitus)   . GERD (gastroesophageal reflux disease)   . DVT (deep venous thrombosis)   . B12 deficiency   . Angiodysplasia of intestine (without mention of hemorrhage)       has past surgical history that includes Tubal ligation; Abdominal hysterectomy; Appendectomy; Pacemaker insertion; Colonoscopy (02/06/2002); and Bladder suspension. Prior to Admission  medications   Medication Sig Start Date End Date Taking? Authorizing Provider  albuterol (VENTOLIN HFA) 108 (90 BASE) MCG/ACT inhaler Inhale 2 puffs into the lungs every 6 (six) hours as needed. For shortness of breath. 02/14/14  Yes Lucky Cowboy, MD  aspirin 81 MG EC tablet Take 81 mg by mouth daily.     Yes Historical Provider, MD  Cholecalciferol (VITAMIN D) 2000 UNITS tablet Take 5,000 Units by mouth daily.    Yes Historical Provider, MD  dexlansoprazole (DEXILANT) 60 MG capsule Take 60 mg by mouth daily.   Yes Historical Provider, MD  furosemide (LASIX) 40 MG tablet Take 40 mg by mouth daily.   Yes Historical Provider, MD  montelukast (SINGULAIR) 10 MG tablet Take 10 mg by mouth at bedtime.   Yes Historical Provider, MD  PARoxetine (PAXIL) 20 MG tablet Take 20 mg by mouth daily.  10/19/13  Yes Historical Provider, MD  predniSONE (DELTASONE) 10 MG tablet Take 10 mg by mouth daily with breakfast.   Yes Historical Provider, MD  simvastatin (ZOCOR) 80 MG tablet Take 40 mg by mouth every Monday, Wednesday, and Friday.  02/24/14  Yes Historical Provider, MD  vitamin B-12 (CYANOCOBALAMIN) 1000 MCG tablet Take 1,000 mcg by mouth daily.   Yes Historical Provider, MD  warfarin (COUMADIN) 5 MG tablet Take 5 mg by mouth daily.   Yes Historical Provider, MD  ONE TOUCH ULTRA TEST test strip USE TO  CHECK BLOOD GLUCOSE ONCE A DAY AS INSTRUCTED 05/19/14   Lucky CowboyWilliam McKeown, MD   Allergies  Allergen Reactions  . Advair Diskus [Fluticasone-Salmeterol]     Itching  . Biaxin [Clarithromycin]     N/V  . Ciprofloxacin     N/V  . Doxycycline Other (See Comments)    Inside of mouth and tongue red and peeled  . Keflex [Cephalexin]     N/V  . Ketek [Telithromycin]     N/V  . Levaquin [Levofloxacin In D5w]     N/V  . Macrobid [Nitrofurantoin]     N/V  . Penicillins     Rash  . Sulfonamide Derivatives     FAMILY HISTORY:  family history includes Asthma in her daughter; CVA in her mother; Colon cancer in  her sister; Emphysema in her maternal uncle; Heart disease in her father and mother. SOCIAL HISTORY:  reports that she quit smoking about 25 years ago. She has never used smokeless tobacco. She reports that she does not drink alcohol or use illicit drugs.  REVIEW OF SYSTEMS:   As per HPI - All other systems reviewed and were neg.   SUBJECTIVE:   VITAL SIGNS: Temp:  [97.2 F (36.2 C)-98.8 F (37.1 C)] 98.1 F (36.7 C) (10/12 0753) Pulse Rate:  [73-91] 91 (10/11 2017) Resp:  [16-29] 17 (10/12 0753) BP: (122-150)/(60-69) 131/60 mmHg (10/12 0753) SpO2:  [86 %-99 %] 97 % (10/12 0930)  PHYSICAL EXAMINATION: General:  Chronically ill appearing elderly female, NAD on NRB Neuro:  Awake, alert, appropriate, MAE  HEENT:  Mm dry, NRB, no JVD Cardiovascular:  s1s2 irreg, AFib 80's Lungs:  resps even non labored on NRB, slightly diminished bases, no audible wheeze  Abdomen:  Round, soft, non tender, +bs Musculoskeletal:  Warm and dry, scant BLE edema    Recent Labs Lab 06/01/14 1656 06/02/14 06/03/14 0230  NA 139 137 137  K 3.9 4.2 4.3  CL 101 91* 96  CO2  --  20 28  BUN 29* 32* 46*  CREATININE 2.50* 2.66* 2.25*  GLUCOSE 222* 400* 263*    Recent Labs Lab 06/01/14 1649 06/01/14 1656 06/02/14 06/03/14 0230  HGB 12.0 13.3 11.8* 10.8*  HCT 39.5 39.0 37.8 34.4*  WBC 8.3  --  7.5 14.6*  PLT 103*  --  150 120*   Dg Chest Port 1 View  06/01/2014   CLINICAL DATA:  Shortness of breath  EXAM: PORTABLE CHEST - 1 VIEW  COMPARISON:  December 09, 2013  FINDINGS: The heart size and mediastinal contours are stable. The heart size is enlarged. Cardiac pacemaker is unchanged. There are mild atelectasis of the right upper lobe and left mid to lower lung. There is no focal pneumonia, pulmonary edema, or pleural effusion. The visualized skeletal structures are stable.  IMPRESSION: Mild atelectasis of both lungs as described. No focal pneumonia or pulmonary edema.   Electronically Signed   By: Sherian ReinWei-Chen   Lin M.D.   On: 06/01/2014 17:54    ASSESSMENT / PLAN:   Acute on chronic resp failure - multifactorial in setting acute on chronic sCHF c/b acute on CKD IV, +/- AECOPD, deconditioning +/- demand ischemic.  Pt is hypoxic but appears comfortable, denies SOB above baseline.  COPD +/- AECOPD  Acute on chronic sCHF  Acute on CKD IV  REC -  Cont empiric abx  Cont BD's - d/c dulera and change to duonebs, budesonide for now  Cont steroids Repeat CXR  If CXR remains fairly clear with  hypoxia out of proportion to mild pulm edema, would rec VQ scan to r/o PE HR control  Cont gentle diuresis per cards/primary  pulm hygiene   Dirk DressKaty Whiteheart, NP 06/03/2014  10:36 AM Pager: [phone] or [phone]  *Care during the described time interval was provided by me and/or other providers on the critical care team. I have reviewed this patient's available data, including medical history, events of note, physical examination and test results as part of my evaluation.  Alyson ReedyWesam G. Yacoub, M.D. [facility]. Pager: [phone]. After hours pager: [phone].

## 2014-06-03 NOTE — Progress Notes (Addendum)
ANTICOAGULATION CONSULT NOTE   Pharmacy Consult for Coumadin Indication: atrial fibrillation  Patient Measurements: Height: 5\' 3"  (160 cm) Weight: 186 lb (84.369 kg) IBW/kg (Calculated) : 52.4  Vital Signs: Temp: 98.1 F (36.7 C) (10/12 0753) Temp Source: Oral (10/12 0753) BP: 131/60 mmHg (10/12 0753)  Labs:  Recent Labs  06/01/14 1649 06/01/14 1656  06/02/14 06/02/14 0600 06/02/14 1355 06/03/14 0230  HGB 12.0 13.3  --  11.8*  --   --  10.8*  HCT 39.5 39.0  --  37.8  --   --  34.4*  PLT 103*  --   --  150  --   --  120*  LABPROT 24.2*  --   --  23.2*  --   --  30.4*  INR 2.17*  --   --  2.06*  --   --  2.91*  CREATININE  --  2.50*  --  2.66*  --   --  2.25*  TROPONINI  --   --   < > 0.48* <0.30 <0.30  --   < > = values in this interval not displayed.  Estimated Creatinine Clearance: 20.5 ml/min (by C-G formula based on Cr of 2.25).   Assessment: 80yo female c/o intermittent generalized weakness x3d, admitted for COPD exacerbation, to continue Coumadin for Afib; admission INR within goal range without bleeding complications.   INR has trended up today from 2.05 to 2.91  She is not a candidate for Ace inhibitor therapy due to her renal dysfunction  Changing IV abx to po Levaquin today  Goal of Therapy:  INR 2-3   Plan:  Decrease Coumadin to 0.5 mg po x 1 dose today Levaquin 750 mg po x 1 today then 500 mg po Q 48 hours for COPD exacerbation Follow up AM INR  Thank you. Okey RegalLisa Lathen Seal, PharmD [phone]  06/03/2014 11:50 AM

## 2014-06-03 NOTE — Consult Note (Addendum)
CONSULT NOTE  Date: 06/03/2014               Patient Name:  [name] MRN: [id number]  DOB: 19-Jul-1932 Age / Sex: 80 y.o., female        PCP: MCKEOWN,[name] Primary Cardiologist: Ladona Ridgel            Referring Physician: Nunzio Cory              Reason for Consult: Worsening CHF           History of Present Illness: Patient is a 80 y.o. female with a PMHx of chronic CHF, ( Bi-V pacer) , atrial fib, DM, HTN, COPD , CKD,  who was admitted to [facility] on 06/01/2014 for evaluation of generalized weakness and mild increasing shortness of breath.   She has had a fever of 101 F.   she's quite limited. She is unable to do a sick housework. She is on home oxygen at 5 L per minute.  She had an elevated B. natruretic peptide ( 40981)  but it should be noted that she has chronic kidney disease-stage IV. Her initial troponin level was 0.75 but has now normalized.  Echo revealed mildly decreased LV function compared to previous Echo in April .     Medications: Outpatient medications: Prescriptions prior to admission  Medication Sig Dispense Refill  . albuterol (VENTOLIN HFA) 108 (90 BASE) MCG/ACT inhaler Inhale 2 puffs into the lungs every 6 (six) hours as needed. For shortness of breath.  1 Inhaler  prn  . aspirin 81 MG EC tablet Take 81 mg by mouth daily.        . Cholecalciferol (VITAMIN D) 2000 UNITS tablet Take 5,000 Units by mouth daily.       Marland Kitchen dexlansoprazole (DEXILANT) 60 MG capsule Take 60 mg by mouth daily.      . furosemide (LASIX) 40 MG tablet Take 40 mg by mouth daily.      . montelukast (SINGULAIR) 10 MG tablet Take 10 mg by mouth at bedtime.      Marland Kitchen PARoxetine (PAXIL) 20 MG tablet Take 20 mg by mouth daily.       . predniSONE (DELTASONE) 10 MG tablet Take 10 mg by mouth daily with breakfast.      . simvastatin (ZOCOR) 80 MG tablet Take 40 mg by mouth every Monday, Wednesday, and Friday.       . vitamin B-12 (CYANOCOBALAMIN) 1000 MCG tablet Take 1,000 mcg by mouth  daily.      Marland Kitchen warfarin (COUMADIN) 5 MG tablet Take 5 mg by mouth daily.      . ONE TOUCH ULTRA TEST test strip USE TO CHECK BLOOD GLUCOSE ONCE A DAY AS INSTRUCTED  100 each  99    Current medications: Current Facility-Administered Medications  Medication Dose Route Frequency Provider Last Rate Last Dose  . acetaminophen (TYLENOL) tablet 650 mg  650 mg Oral Q6H PRN Therisa Doyne, MD       Or  . acetaminophen (TYLENOL) suppository 650 mg  650 mg Rectal Q6H PRN Therisa Doyne, MD      . aspirin EC tablet 81 mg  81 mg Oral Daily Therisa Doyne, MD   81 mg at 06/02/14 1032  . atorvastatin (LIPITOR) tablet 40 mg  40 mg Oral q1800 Therisa Doyne, MD   40 mg at 06/02/14 1746  . azithromycin (ZITHROMAX) 500 mg in dextrose 5 % 250 mL IVPB  500 mg Intravenous Q24H  Therisa DoyneAnastassia Doutova, MD 250 mL/hr at 06/02/14 2252 500 mg at 06/02/14 2252  . cefTRIAXone (ROCEPHIN) 1 g in dextrose 5 % 50 mL IVPB  1 g Intravenous Q24H Therisa DoyneAnastassia Doutova, MD 100 mL/hr at 06/02/14 2222 1 g at 06/02/14 2222  . guaiFENesin (MUCINEX) 12 hr tablet 600 mg  600 mg Oral BID Therisa DoyneAnastassia Doutova, MD   600 mg at 06/02/14 2258  . HYDROcodone-acetaminophen (NORCO/VICODIN) 5-325 MG per tablet 1-2 tablet  1-2 tablet Oral Q4H PRN Therisa DoyneAnastassia Doutova, MD      . insulin aspart (novoLOG) injection 0-15 Units  0-15 Units Subcutaneous TID WC Therisa DoyneAnastassia Doutova, MD   3 Units at 06/02/14 1746  . insulin aspart (novoLOG) injection 0-5 Units  0-5 Units Subcutaneous QHS Therisa DoyneAnastassia Doutova, MD   2 Units at 06/02/14 2228  . ipratropium (ATROVENT) nebulizer solution 0.5 mg  0.5 mg Nebulization TID Maryann Mikhail, DO      . methylPREDNISolone sodium succinate (SOLU-MEDROL) 125 mg/2 mL injection 60 mg  60 mg Intravenous Q12H Therisa DoyneAnastassia Doutova, MD   60 mg at 06/03/14 0010  . mometasone-formoterol (DULERA) 200-5 MCG/ACT inhaler 2 puff  2 puff Inhalation BID Therisa DoyneAnastassia Doutova, MD   2 puff at 06/02/14 2016  . montelukast (SINGULAIR) tablet 10  mg  10 mg Oral QHS Therisa DoyneAnastassia Doutova, MD   10 mg at 06/02/14 2258  . ondansetron (ZOFRAN) tablet 4 mg  4 mg Oral Q6H PRN Therisa DoyneAnastassia Doutova, MD       Or  . ondansetron (ZOFRAN) injection 4 mg  4 mg Intravenous Q6H PRN Therisa DoyneAnastassia Doutova, MD      . pantoprazole (PROTONIX) EC tablet 40 mg  40 mg Oral Q1200 Therisa DoyneAnastassia Doutova, MD   40 mg at 06/02/14 1148  . PARoxetine (PAXIL) tablet 20 mg  20 mg Oral Daily Therisa DoyneAnastassia Doutova, MD   20 mg at 06/02/14 1032  . sodium chloride 0.9 % injection 3 mL  3 mL Intravenous Q12H Therisa DoyneAnastassia Doutova, MD   3 mL at 06/02/14 2223  . warfarin (COUMADIN) tablet 2.5 mg  2.5 mg Oral q1800 Colleen CanVeronda Pauline Bryk, RPH   2.5 mg at 06/02/14 1746  . Warfarin - Pharmacist Dosing Inpatient   Does not apply q1800 Colleen CanVeronda Pauline Bryk, [facility]PH         Allergies  Allergen Reactions  . Advair Diskus [Fluticasone-Salmeterol]     Itching  . Biaxin [Clarithromycin]     N/V  . Ciprofloxacin     N/V  . Doxycycline Other (See Comments)    Inside of mouth and tongue red and peeled  . Keflex [Cephalexin]     N/V  . Ketek [Telithromycin]     N/V  . Levaquin [Levofloxacin In D5w]     N/V  . Macrobid [Nitrofurantoin]     N/V  . Penicillins     Rash  . Sulfonamide Derivatives      Past Medical History  Diagnosis Date  . AV block, complete   . CHF (congestive heart failure)   . AAA (abdominal aortic aneurysm)   . Dyslipidemia   . History of colonic polyps   . Anxiety and depression   . Diverticulosis of colon   . Asthma   . Acute renal failure   . Atrial flutter   . Hyperglycemia   . UTI (lower urinary tract infection)   . COPD (chronic obstructive pulmonary disease)   . HTN (hypertension)   . DM (diabetes mellitus)   . GERD (gastroesophageal reflux disease)   . DVT (  deep venous thrombosis)   . B12 deficiency   . Angiodysplasia of intestine (without mention of hemorrhage)     Past Surgical History  Procedure Laterality Date  . Tubal ligation    . Abdominal  hysterectomy    . Appendectomy    . Pacemaker insertion      medtronic   . Colonoscopy  02/06/2002    161.09,[phone],562.10  . Bladder suspension      Family History  Problem Relation Age of Onset  . Emphysema Maternal Uncle     multiple   . Asthma Daughter   . Colon cancer Sister     alive at 2180   . Heart disease Father   . CVA Mother   . Heart disease Mother     Social History:  reports that she quit smoking about 25 years ago. She has never used smokeless tobacco. She reports that she does not drink alcohol or use illicit drugs.   Review of Systems: Constitutional:  admits to fever of 101 F.    HEENT: denies photophobia, eye pain, redness, hearing loss, ear pain, congestion, sore throat, rhinorrhea, sneezing, neck pain, neck stiffness and tinnitus.  Respiratory: admits to SOB, DOE,   Cardiovascular: denies chest pain, palpitations and leg swelling.  Gastrointestinal: denies nausea, vomiting, abdominal pain, diarrhea, constipation, blood in stool.  Genitourinary: denies dysuria, urgency, frequency, hematuria, flank pain and difficulty urinating.  Musculoskeletal: denies  myalgias, back pain, joint swelling, arthralgias and gait problem.   Skin: denies pallor, rash and wound.  Neurological: denies dizziness, seizures, syncope, weakness, light-headedness, numbness and headaches.   Hematological: denies adenopathy, easy bruising, personal or family bleeding history.  Psychiatric/ Behavioral: denies suicidal ideation, mood changes, confusion, nervousness, sleep disturbance and agitation.    Physical Exam: BP 131/60  Pulse 91  Temp(Src) 98.1 F (36.7 C) (Oral)  Resp 17  Ht 5\' 3"  (1.6 m)  Wt 186 lb (84.369 kg)  BMI 32.96 kg/m2  SpO2 88%  Wt Readings from Last 3 Encounters:  06/02/14 186 lb (84.369 kg)  03/14/14 188 lb (85.276 kg)  01/07/14 184 lb (83.462 kg)    General: Vital signs reviewed and noted. Chronically ill-appearing female. She is in no acute distress.   Head:  Normocephalic, atraumatic, sclera anicteric,   Neck: Supple. Negative for carotid bruits. No JVD   Lungs:  Rales in right base   Heart: RRR with S1 S2. No murmurs, rubs, or gallops   Abdomen:  Soft, non-tender, non-distended with normoactive bowel sounds. No hepatomegaly. No rebound/guarding. No obvious abdominal masses   MSK: Strength and the appear normal for age.   Extremities: No clubbing or cyanosis. No edema.  Distal pedal pulses are 2+ and equal   Neurologic: Alert and oriented X 3. Moves all extremities spontaneously.  Psych: Responds to questions appropriately with a normal affect.     Lab results: Basic Metabolic Panel:  Recent Labs Lab 06/01/14 1656 06/02/14 06/03/14 0230  NA 139 137 137  K 3.9 4.2 4.3  CL 101 91* 96  CO2  --  20 28  GLUCOSE 222* 400* 263*  BUN 29* 32* 46*  CREATININE 2.50* 2.66* 2.25*  CALCIUM  --  8.6 9.1  MG  --  2.3  --   PHOS  --  5.1*  --     Liver Function Tests:  Recent Labs Lab 06/01/14 1649 06/02/14  AST 32 31  ALT 11 10  ALKPHOS 67 64  BILITOT 0.7 0.3  PROT 6.7 6.6  ALBUMIN 3.4* 3.2*   No results found for this basename: LIPASE, AMYLASE,  in the last 168 hours No results found for this basename: AMMONIA,  in the last 168 hours  CBC:  Recent Labs Lab 06/01/14 1649 06/01/14 1656 06/02/14 06/03/14 0230  WBC 8.3  --  7.5 14.6*  NEUTROABS 6.7  --   --   --   HGB 12.0 13.3 11.8* 10.8*  HCT 39.5 39.0 37.8 34.4*  MCV 98.8  --  102.2* 96.9  PLT 103*  --  150 120*    Cardiac Enzymes:  Recent Labs Lab 06/01/14 2016 06/02/14 06/02/14 0600 06/02/14 1355  TROPONINI 0.75* 0.48* <0.30 <0.30    BNP: No components found with this basename: POCBNP,   CBG:  Recent Labs Lab 06/02/14 0250 06/02/14 0822 06/02/14 1142 06/02/14 1614 06/02/14 2141  GLUCAP 307* 263* 268* 162* 228*    Coagulation Studies:  Recent Labs  06/01/14 1649 06/02/14 06/03/14 0230  LABPROT 24.2* 23.2* 30.4*  INR 2.17* 2.06* 2.91*      Other results:  EKG :     Imaging: Dg Chest Port 1 View  06/01/2014   CLINICAL DATA:  Shortness of breath  EXAM: PORTABLE CHEST - 1 VIEW  COMPARISON:  December 09, 2013  FINDINGS: The heart size and mediastinal contours are stable. The heart size is enlarged. Cardiac pacemaker is unchanged. There are mild atelectasis of the right upper lobe and left mid to lower lung. There is no focal pneumonia, pulmonary edema, or pleural effusion. The visualized skeletal structures are stable.  IMPRESSION: Mild atelectasis of both lungs as described. No focal pneumonia or pulmonary edema.   Electronically Signed   By: Sherian Rein M.D.   On: 06/01/2014 17:54        Assessment & Plan:  1. chronic systolic congestive heart failure: The patient has a history of chronic systolic CHF.  Her last ejection fraction was read out of 55% and current echo reported an ejection fraction of 45%. Have reviewed both echocardiograms  and LV function is not all that different.  She has a history of a biventricular pacer but reportedly the left ventricular lead has been disabled because of diaphragmatic pacing.  Her EKG shows a spike in the QRS that may be LV pacing. I will have the pacer  interrogated for further evaluation.  Her B. natruretic peptide is elevated but she has chronic kidney disease-stage IV. BNP levels are not reliable in severe renal dysfunction.  She's not having any episodes of chest pain. Her main complaint is that of leg weakness and an inability to walk.   She has a history of severe chronic systolic CHF and is hypoxic on her home dose of oxygen.    She would likely benefit from some additional diuresis.  Will give her Lasix 40 mg I V this am   Given her chronic renal insufficiency I would not recommend cardiac catheterization. She is not on ACE-I or ARB due to her CKD.  2. COPD:  Plans per Int. Med.  3. CKD - stage 4:  Plans per Int. Med. Team.   4. Atrial fib: rate was elevated earlier  today.    She is a poor candidate for Amio given her severe lung disease.  She has not wanted to take Tikosyn.    It appears that beta blockers have not been used in the past because of her COPD but we may need to try a low dose to help control her HR.  When I returned to see her , HR HR is 78-84 and appears to be well controlled at this point.     Vesta Mixer, Montez Hageman., MD, [facility] 06/03/2014, 8:08 AM Office - [phone] Pager [phone]

## 2014-06-03 NOTE — Progress Notes (Addendum)
Attempted to try pt on Neibert as told by Dr. Catha GosselinMikhail to at least keep sats to 88-89%. Pt sats remained around 82-84% on 6L while eating. After pt was done eating her sats dropped to 78% on the 6L [place] she was not doing any activity except watching tv. Pt was not in any distress or discomfort and even replied that she feels fine. Placed pt back on the partial rebreather and pt sat returned back to 95-96%. Pt stated to me that while she was home she did house chores such as washing clothes, dishes, etc and would be on her 5L [place] and would check her oxygen at the kitchen table and it would be 92-93%. Educated pt on coughing and deep breathing exercises that could help and will try to utilize the incentive spirometer prior to her trying to eat lunch. Pt is anxious about getting home but understands that her oxygen status is important.

## 2014-06-03 NOTE — Progress Notes (Addendum)
[facility]                                                                              Patient Demographics  [name], is a 80 y.o. female, DOB - 1932/08/20, WUJ:[id number]  Admit date - 06/01/2014   Admitting Physician Therisa Doyne, MD  Outpatient Primary MD for the patient is Nadean Corwin, MD  LOS - 2   Chief Complaint  Patient presents with  . Weakness      HPI on 06/01/2014 81 year old female with past medical history of CHF, atrial fibrillation, diabetes mellitus, hypertension, COPD that presented to the emergency department with a history of worsening shortness of breath as well as fever for one week. Patient's fever did go to 101F however resolved with Tylenol. Patient initially was improving at home however over the next few days, she developed diarrhea which lasted 24 hours and has since resolved. Patient has history of COPD and asthma for which she sees Dr. Sherene Sires, and is maintained on daily prednisone 10 mg per day. Patient also uses 5 L of oxygen at home, her family did try to increase her to 6 L however this did not help her shortness of breath. She presented with worsening short of breath to the emergency department and required continuous nebulizer treatment. Patient was also found to have increased creatinine up to 2.7 as well as an elevated troponin. She denied chest pain upon admission.  Assessment & Plan   Acute on chronic respiratory failure possibly secondary to COPD exacerbation versus CHF -Patient currently on nonrebreather, will attempt to wean to nasal cannula. -Continue DuoNeb treatments, Solu-Medrol, dulera, guaifenesin -Will place patient on IV Levaquin and discontinue azithromycin and ceftriaxone. -Patient did have an elevated BNP of 14,000 upon admission, however clinically patient does not appear to be volume overloaded. (BNP not accurate measure of CHF given her CKD) -Chest x-ray showed atelectasis in both lungs, no pneumonia or  pulmonary edema -Echocardiogram: Reduced EF from prior Echo: EF 45% ?Takotsubo cardiomyopathy -Suspect patient has chronically low oxygen saturations as she requires 5L at home.   -?PE but patient is anticoagulated on coumadin -Will consult pulmonology and cardiology  Elevated troponin likely demand ischemia -Patient was noted to have an elevated 0.7 upon admission however this isn't intended in this come down to less than 0.3. -Patient currently is chest pain free. No EKG changes noted. -Case was discussed by the admitting physician and cardiology. -Echocardiogram: EF 45% ?Takotsubo cardiomyopathy  Acute on chronic kidney disease, stage 3 -Possibly secondary to dehydration versus diuretics -Creatinine improving -UO in the past 24hrs 1150cc -Patient given one dose of lasix, 40mg  IV -Will continue to monitor patient's urinary output as well as BMP  Chronic diastolic heart failure -Echocardiogram in April 2015 shows EF of 55-60%, unable to evaluate diastolic dysfunction -Echocardiogram 06/02/2014: EF 45% ?Takotsubo cardiomyopathy -Patient did have an elevated BNP upon admission however appears to be euvolemic at this time -Will continue to monitor her daily weights, intake and output. -Cardiology consulted  Brief episode of diarrhea -Resolved  Hypertension -Currently stable, Lasix held -Will add on medications if needed  Atrial fibrillation -Currently rate controlled, on no medications, has pacemaker -Continue Coumadin per  pharmacy for anticoagulation  History of complete heart block -Stable, patient has pacemaker in place  Hyperglycemia in the setting of diet controlled diabetes -Patient's last hemoglobin A1c was 7.4 in July 2015 -Continue insulin scale with CBG monitoring -Likely elevated secondary to Solu-Medrol  Hyperlipidemia -Continue statin  Leukocytosis -Likely reactive to steroids -Will continue to monitor CBC  Code Status: DO NOT RESUSCITATE  Family  Communication: None at bedside  Disposition Plan: Admitted  Time Spent in minutes   30 minutes  Procedures  Echocardiogram  Consults   None  DVT Prophylaxis  Coumadin  Lab Results  Component Value Date   PLT 120* 06/03/2014    Medications  Scheduled Meds: . aspirin EC  81 mg Oral Daily  . atorvastatin  40 mg Oral q1800  . azithromycin  500 mg Intravenous Q24H  . cefTRIAXone (ROCEPHIN) IVPB 1 gram/50 mL D5W  1 g Intravenous Q24H  . guaiFENesin  600 mg Oral BID  . insulin aspart  0-15 Units Subcutaneous TID WC  . insulin aspart  0-5 Units Subcutaneous QHS  . ipratropium  0.5 mg Nebulization TID  . methylPREDNISolone (SOLU-MEDROL) injection  60 mg Intravenous Q12H  . mometasone-formoterol  2 puff Inhalation BID  . montelukast  10 mg Oral QHS  . pantoprazole  40 mg Oral Q1200  . PARoxetine  20 mg Oral Daily  . sodium chloride  3 mL Intravenous Q12H  . warfarin  2.5 mg Oral q1800  . Warfarin - Pharmacist Dosing Inpatient   Does not apply q1800   Continuous Infusions:  PRN Meds:.acetaminophen, acetaminophen, HYDROcodone-acetaminophen, ondansetron (ZOFRAN) IV, ondansetron  Antibiotics    Anti-infectives   Start     Dose/Rate Route Frequency Ordered Stop   06/01/14 2215  cefTRIAXone (ROCEPHIN) 1 g in dextrose 5 % 50 mL IVPB     1 g 100 mL/hr over 30 Minutes Intravenous Every 24 hours 06/01/14 2211     06/01/14 2215  azithromycin (ZITHROMAX) 500 mg in dextrose 5 % 250 mL IVPB     500 mg 250 mL/hr over 60 Minutes Intravenous Every 24 hours 06/01/14 2211        Subjective:   [name] seen and examined today.  Patient continues to complain of shortness of breath.  She has been able to come off of the nonrebreather, but desats into the low 80s.  She denies dizziness, headache, chest pain, abdominal pain, N/V.   Objective:   Filed Vitals:   06/03/14 0823 06/03/14 0900 06/03/14 0930 06/03/14 1030  BP:      Pulse:      Temp:      TempSrc:      Resp:        Height:      Weight:      SpO2: 86% 93% 97% 94%    Wt Readings from Last 3 Encounters:  06/02/14 84.369 kg (186 lb)  03/14/14 85.276 kg (188 lb)  01/07/14 83.462 kg (184 lb)     Intake/Output Summary (Last 24 hours) at 06/03/14 1050 Last data filed at 06/03/14 1000  Gross per 24 hour  Intake    680 ml  Output   1325 ml  Net   -645 ml    Exam  General: Well developed, well nourished, NAD, appears stated age  HEENT: NCAT,  mucous membranes moist. Nonrebreather in place.  Cardiovascular: S1 S2 auscultated, Irregular  Respiratory: Diminished but Clear to auscultation, comfortable NRB  Abdomen: Soft, nontender, nondistended, + bowel sounds  Extremities: warm  dry without cyanosis clubbing. Trace edemaB/L in LE   Neuro: AAOx3, no focal deficits  Psych: Normal affect and demeanor   Data Review   Micro Results Recent Results (from the past 240 hour(s))  MRSA PCR SCREENING     Status: None   Collection Time    06/02/14  6:59 AM      Result Value Ref Range Status   MRSA by PCR NEGATIVE  NEGATIVE Final   Comment:            The GeneXpert MRSA Assay (FDA     approved for NASAL specimens     only), is one component of a     comprehensive MRSA colonization     surveillance program. It is not     intended to diagnose MRSA     infection nor to guide or     monitor treatment for     MRSA infections.    Radiology Reports Dg Chest Port 1 View  06/01/2014   CLINICAL DATA:  Shortness of breath  EXAM: PORTABLE CHEST - 1 VIEW  COMPARISON:  December 09, 2013  FINDINGS: The heart size and mediastinal contours are stable. The heart size is enlarged. Cardiac pacemaker is unchanged. There are mild atelectasis of the right upper lobe and left mid to lower lung. There is no focal pneumonia, pulmonary edema, or pleural effusion. The visualized skeletal structures are stable.  IMPRESSION: Mild atelectasis of both lungs as described. No focal pneumonia or pulmonary edema.   Electronically  Signed   By: Sherian Rein M.D.   On: 06/01/2014 17:54    CBC  Recent Labs Lab 06/01/14 1649 06/01/14 1656 06/02/14 06/03/14 0230  WBC 8.3  --  7.5 14.6*  HGB 12.0 13.3 11.8* 10.8*  HCT 39.5 39.0 37.8 34.4*  PLT 103*  --  150 120*  MCV 98.8  --  102.2* 96.9  MCH 30.0  --  31.9 30.4  MCHC 30.4  --  31.2 31.4  RDW 15.2  --  15.8* 15.1  LYMPHSABS 0.8  --   --   --   MONOABS 0.8  --   --   --   EOSABS 0.0  --   --   --   BASOSABS 0.0  --   --   --     Chemistries   Recent Labs Lab 06/01/14 1649 06/01/14 1656 06/02/14 06/03/14 0230  NA  --  139 137 137  K  --  3.9 4.2 4.3  CL  --  101 91* 96  CO2  --   --  20 28  GLUCOSE  --  222* 400* 263*  BUN  --  29* 32* 46*  CREATININE  --  2.50* 2.66* 2.25*  CALCIUM  --   --  8.6 9.1  MG  --   --  2.3  --   AST 32  --  31  --   ALT 11  --  10  --   ALKPHOS 67  --  64  --   BILITOT 0.7  --  0.3  --    ------------------------------------------------------------------------------------------------------------------ estimated creatinine clearance is 20.5 ml/min (by C-G formula based on Cr of 2.25). ------------------------------------------------------------------------------------------------------------------ No results found for this basename: HGBA1C,  in the last 72 hours ------------------------------------------------------------------------------------------------------------------ No results found for this basename: CHOL, HDL, LDLCALC, TRIG, CHOLHDL, LDLDIRECT,  in the last 72 hours ------------------------------------------------------------------------------------------------------------------  Recent Labs  06/02/14  TSH 0.409   ------------------------------------------------------------------------------------------------------------------ No results found for this  basename: VITAMINB12, FOLATE, FERRITIN, TIBC, IRON, RETICCTPCT,  in the last 72 hours  Coagulation profile  Recent Labs Lab 05/28/14 1303  06/01/14 1649 06/02/14 06/03/14 0230  INR 2.7 2.17* 2.06* 2.91*    No results found for this basename: DDIMER,  in the last 72 hours  Cardiac Enzymes  Recent Labs Lab 06/02/14 06/02/14 0600 06/02/14 1355  TROPONINI 0.48* <0.30 <0.30   ------------------------------------------------------------------------------------------------------------------ No components found with this basename: POCBNP,     Orvie Caradine D.O. on 06/03/2014 at 10:50 AM  Between 7am to 7pm - Pager - [phone]  After 7pm go to [email or web address] - password [email or web address]  And look for the night coverage person covering for me after hours  [facility] Office  [phone]

## 2014-06-04 LAB — CBC
HCT: 35.5 % — ABNORMAL LOW (ref 36.0–46.0)
HEMOGLOBIN: 11.1 g/dL — AB (ref 12.0–15.0)
MCH: 30.1 pg (ref 26.0–34.0)
MCHC: 31.3 g/dL (ref 30.0–36.0)
MCV: 96.2 fL (ref 78.0–100.0)
Platelets: 139 10*3/uL — ABNORMAL LOW (ref 150–400)
RBC: 3.69 MIL/uL — ABNORMAL LOW (ref 3.87–5.11)
RDW: 15 % (ref 11.5–15.5)
WBC: 12.3 10*3/uL — AB (ref 4.0–10.5)

## 2014-06-04 LAB — BASIC METABOLIC PANEL
ANION GAP: 15 (ref 5–15)
BUN: 51 mg/dL — ABNORMAL HIGH (ref 6–23)
CO2: 29 mEq/L (ref 19–32)
Calcium: 9.2 mg/dL (ref 8.4–10.5)
Chloride: 97 mEq/L (ref 96–112)
Creatinine, Ser: 2.11 mg/dL — ABNORMAL HIGH (ref 0.50–1.10)
GFR calc Af Amer: 24 mL/min — ABNORMAL LOW (ref >90)
GFR calc non Af Amer: 21 mL/min — ABNORMAL LOW (ref >90)
GLUCOSE: 216 mg/dL — AB (ref 70–99)
POTASSIUM: 4.7 meq/L (ref 3.7–5.3)
Sodium: 141 mEq/L (ref 137–147)

## 2014-06-04 LAB — PROTIME-INR
INR: 2.8 — ABNORMAL HIGH (ref 0.00–1.49)
Prothrombin Time: 29.5 seconds — ABNORMAL HIGH (ref 11.6–15.2)

## 2014-06-04 LAB — GLUCOSE, CAPILLARY
GLUCOSE-CAPILLARY: 258 mg/dL — AB (ref 70–99)
GLUCOSE-CAPILLARY: 325 mg/dL — AB (ref 70–99)
Glucose-Capillary: 261 mg/dL — ABNORMAL HIGH (ref 70–99)
Glucose-Capillary: 246 mg/dL — ABNORMAL HIGH (ref 70–99)

## 2014-06-04 MED ORDER — PREDNISONE (PAK) 10 MG PO TABS: ORAL_TABLET | Freq: Every day | ORAL | Status: DC

## 2014-06-04 MED ORDER — WARFARIN SODIUM 1 MG PO TABS
1.0000 mg | ORAL_TABLET | Freq: Once | ORAL | Status: AC
  Administered 2014-06-04: 1 mg via ORAL
  Filled 2014-06-04: qty 1

## 2014-06-04 MED ORDER — PREDNISONE 10 MG PO TABS: 10.0000 mg | ORAL_TABLET | Freq: Every day | ORAL | Status: DC

## 2014-06-04 MED ORDER — BUDESONIDE 0.25 MG/2ML IN SUSP
0.2500 mg | Freq: Four times a day (QID) | RESPIRATORY_TRACT | Status: DC
Start: 2014-06-04 — End: 2015-03-14

## 2014-06-04 MED ORDER — GUAIFENESIN ER 600 MG PO TB12: 600.0000 mg | ORAL_TABLET | Freq: Two times a day (BID) | ORAL | Status: DC

## 2014-06-04 MED ORDER — IPRATROPIUM-ALBUTEROL 0.5-2.5 (3) MG/3ML IN SOLN
3.0000 mL | Freq: Four times a day (QID) | RESPIRATORY_TRACT | Status: DC
  Administered 2014-06-04 – 2014-06-07 (×14): 3 mL via RESPIRATORY_TRACT
  Filled 2014-06-04 (×14): qty 3

## 2014-06-04 MED ORDER — LEVOFLOXACIN 500 MG PO TABS: 500.0000 mg | ORAL_TABLET | ORAL | Status: DC

## 2014-06-04 MED ORDER — FUROSEMIDE 40 MG PO TABS
40.0000 mg | ORAL_TABLET | Freq: Every day | ORAL | Status: DC
  Administered 2014-06-04 – 2014-06-07 (×4): 40 mg via ORAL
  Filled 2014-06-04 (×4): qty 1

## 2014-06-04 MED ORDER — ATORVASTATIN CALCIUM 40 MG PO TABS: 40.0000 mg | ORAL_TABLET | Freq: Every day | ORAL | Status: DC

## 2014-06-04 NOTE — Progress Notes (Signed)
Pt had sustained vtach for 20 seconds. Rushed to pt's room to assess. Upon entering pt's HR went back down into 70's. Pt denies chest pain but did state she felt "funny" and started to "blackout". EKG done and MD on call paged and orders given. Will continue to monitor at this time.

## 2014-06-04 NOTE — Progress Notes (Signed)
Inpatient Diabetes Program Recommendations  AACE/ADA: New Consensus Statement on Inpatient Glycemic Control (2013)  Target Ranges:  Prepandial:   less than 140 mg/dL      Peak postprandial:   less than 180 mg/dL (1-2 hours)      Critically ill patients:  140 - 180 mg/dL  Results for Salvatore MarvelBIGGS, Arryn S (MRN [id number]) as of 06/04/2014 10:13  Ref. Range 06/03/2014 11:48 06/03/2014 16:40 06/03/2014 20:59 06/04/2014 08:09  Glucose-Capillary Latest Range: 70-99 mg/dL 782204 (H) 956226 (H) 213261 (H) 258 (H)    Inpatient Diabetes Program Recommendations Insulin - Basal: consider adding low dose basal Levemir 10 units during steroid therapy  Thank you  Piedad ClimesGina Amiere Cawley BSN, RN,CDE Inpatient Diabetes Coordinator [phone] (team pager)

## 2014-06-04 NOTE — Progress Notes (Signed)
Patient Name: [name] MarvelGertrude S [name] Date of Encounter: 06/04/2014     Active Problems:   Hypertension   Atrial fibrillation   Chronic diastolic heart failure   Respiratory distress   COPD exacerbation   DM2 (diabetes mellitus, type 2)   Acute on chronic renal failure   Acute on chronic respiratory failure with hypoxemia   Elevated troponin   DM type 2 causing CKD stage 2    SUBJECTIVE  Patient is doing much better.  No chest pain. Breathing is not labored. She is on oxygen at home 24/7. Telemetry shows atrial fibrillation with ventricular pacing. She has had a good response to lasix.  CURRENT MEDS . aspirin EC  81 mg Oral Daily  . atorvastatin  40 mg Oral q1800  . budesonide  0.25 mg Nebulization Q6H  . guaiFENesin  600 mg Oral BID  . insulin aspart  0-15 Units Subcutaneous TID WC  . insulin aspart  0-5 Units Subcutaneous QHS  . ipratropium-albuterol  3 mL Nebulization Q6H  . [START ON 06/05/2014] levofloxacin  500 mg Oral Q48H  . methylPREDNISolone (SOLU-MEDROL) injection  60 mg Intravenous Q12H  . montelukast  10 mg Oral QHS  . pantoprazole  40 mg Oral Q1200  . PARoxetine  20 mg Oral Daily  . sodium chloride  3 mL Intravenous Q12H  . warfarin  1 mg Oral ONCE-1800  . Warfarin - Pharmacist Dosing Inpatient   Does not apply q1800    OBJECTIVE  Filed Vitals:   06/04/14 0105 06/04/14 0300 06/04/14 0720 06/04/14 0902  BP:  140/72 148/65   Pulse:  70    Temp:  97.8 F (36.6 C) 97.9 F (36.6 C)   TempSrc:  Oral Oral   Resp:  19 21   Height:      Weight:      SpO2: 90% 96% 96% 90%    Intake/Output Summary (Last 24 hours) at 06/04/14 1141 Last data filed at 06/04/14 0034  Gross per 24 hour  Intake      0 ml  Output   1000 ml  Net  -1000 ml   Filed Weights   06/01/14 1638 06/02/14 0650  Weight: 183 lb (83.008 kg) 186 lb (84.369 kg)    PHYSICAL EXAM  General: Pleasant, NAD. On nasal O2 Neuro: Alert and oriented X 3. Moves all extremities  spontaneously. Psych: Normal affect. HEENT:  Normal  Neck: Supple without bruits or JVD. Lungs:  Resp regular. Bibasilar rales. Heart: RRR no s3, s4, or murmurs. Abdomen: Soft, non-tender, non-distended, BS + x 4.  Extremities: No clubbing, cyanosis or edema. DP/PT/Radials 2+ and equal bilaterally.  Accessory Clinical Findings  CBC  Recent Labs  06/01/14 1649  06/03/14 0230 06/04/14 0221  WBC 8.3  < > 14.6* 12.3*  NEUTROABS 6.7  --   --   --   HGB 12.0  < > 10.8* 11.1*  HCT 39.5  < > 34.4* 35.5*  MCV 98.8  < > 96.9 96.2  PLT 103*  < > 120* 139*  < > = values in this interval not displayed. Basic Metabolic Panel  Recent Labs  06/02/14 06/03/14 0230 06/04/14 0221  NA 137 137 141  K 4.2 4.3 4.7  CL 91* 96 97  CO2 20 28 29   GLUCOSE 400* 263* 216*  BUN 32* 46* 51*  CREATININE 2.66* 2.25* 2.11*  CALCIUM 8.6 9.1 9.2  MG 2.3  --   --   PHOS 5.1*  --   --  Liver Function Tests  Recent Labs  06/01/14 1649 06/02/14  AST 32 31  ALT 11 10  ALKPHOS 67 64  BILITOT 0.7 0.3  PROT 6.7 6.6  ALBUMIN 3.4* 3.2*   No results found for this basename: LIPASE, AMYLASE,  in the last 72 hours Cardiac Enzymes  Recent Labs  06/02/14 06/02/14 0600 06/02/14 1355  TROPONINI 0.48* <0.30 <0.30   BNP No components found with this basename: POCBNP,  D-Dimer No results found for this basename: DDIMER,  in the last 72 hours Hemoglobin A1C No results found for this basename: HGBA1C,  in the last 72 hours Fasting Lipid Panel No results found for this basename: CHOL, HDL, LDLCALC, TRIG, CHOLHDL, LDLDIRECT,  in the last 72 hours Thyroid Function Tests  Recent Labs  06/02/14  TSH 0.409    TELE  Atrial fibrillation with ventricular pacemaker  ECG  Atrial fibrillation with ventricular pacing.  Radiology/Studies  Dg Chest Portable 1 View  06/03/2014   CLINICAL DATA:  Hypoxia.  EXAM: PORTABLE CHEST - 1 VIEW  COMPARISON:  06/01/2014  FINDINGS: Left cardiac pacemaker and the  leads appear to be stable. Negative for a pneumothorax. Mild enlargement of the cardiac silhouette is unchanged. Increased linear densities in the left mid lung. There appears to be decreased atelectasis in the right lung. The trachea is midline.  IMPRESSION: Increased densities in the left mid lung. Findings are most compatible with increased atelectasis in this region.   Electronically Signed   By: Richarda OverlieAdam  Henn M.D.   On: 06/03/2014 12:46   Dg Chest Port 1 View  06/01/2014   CLINICAL DATA:  Shortness of breath  EXAM: PORTABLE CHEST - 1 VIEW  COMPARISON:  December 09, 2013  FINDINGS: The heart size and mediastinal contours are stable. The heart size is enlarged. Cardiac pacemaker is unchanged. There are mild atelectasis of the right upper lobe and left mid to lower lung. There is no focal pneumonia, pulmonary edema, or pleural effusion. The visualized skeletal structures are stable.  IMPRESSION: Mild atelectasis of both lungs as described. No focal pneumonia or pulmonary edema.   Electronically Signed   By: Sherian ReinWei-Chen  Lin M.D.   On: 06/01/2014 17:54    ASSESSMENT AND PLAN  1. Chronic systolic heart failure. Improved after IV lasix yesterday. 2. COPD 3. CKD stage 4 4. Atrial fibrillation, chronic , on warfarin. Has complete heart block with functioning pacemaker.  Plan: Resume home dose of lasix 40 mg daily. Stop baby ASA since on warfarin. Mobilize.  Signed, Cassell Clementhomas Muranda Coye MD

## 2014-06-04 NOTE — Progress Notes (Signed)
ANTICOAGULATION CONSULT NOTE   Pharmacy Consult for Coumadin Indication: atrial fibrillation  Patient Measurements: Height: 5\' 3"  (160 cm) Weight: 186 lb (84.369 kg) IBW/kg (Calculated) : 52.4  Vital Signs: Temp: 97.9 F (36.6 C) (10/13 0720) Temp Source: Oral (10/13 0720) BP: 148/65 mmHg (10/13 0720) Pulse Rate: 70 (10/13 0300)  Labs:  Recent Labs  06/02/14 06/02/14 0600 06/02/14 1355 06/03/14 0230 06/04/14 0221  HGB 11.8*  --   --  10.8* 11.1*  HCT 37.8  --   --  34.4* 35.5*  PLT 150  --   --  120* 139*  LABPROT 23.2*  --   --  30.4* 29.5*  INR 2.06*  --   --  2.91* 2.80*  CREATININE 2.66*  --   --  2.25* 2.11*  TROPONINI 0.48* <0.30 <0.30  --   --     Estimated Creatinine Clearance: 21.9 ml/min (by C-G formula based on Cr of 2.11).   Assessment: 80yo female c/o intermittent generalized weakness x3d, admitted for COPD exacerbation, to continue Coumadin for Afib; admission INR within goal range without bleeding complications.   INR = 2.8  She is not a candidate for Ace inhibitor therapy due to her renal dysfunction  Continues on po Levaquin  Goal of Therapy:  INR 2-3   Plan:  Follow up AM INR Coumadin 1 mg po x 1 today  Thank you. Okey RegalLisa Aizah Gehlhausen, PharmD [phone]  06/04/2014 10:28 AM

## 2014-06-04 NOTE — Progress Notes (Addendum)
Cardiology cross-cover note: Called to bedside due to pt with 20 secs of polymorphic VT that spontaneously resolved. Pt felt dizzy during event, now back to normal. BP 165/56. Baseline underlying rhythm is atrial fib with ventricular pacing. EKG on 10/11 with a QTc of ~600 ms. EKG now is same, QTc~500 ms. At admission and prior, more in the 450 ms range. Echo with EF of 45%, apical hypokinesis with suggestive pattern of takatsubo per report.  Labs including K and Mg pending. Will replete if needed.  Concern for medication induced QT prolongation. Noted levaquin started this admission. Also received azithromyicin but this was stopped. PRN zofran ordered (not sure if given). Paroxetine could also be contributing though she has been on this prior to admission. Recommend alternative antibiotic to levaquin. Stopped zofran. Replete lytes if needed. Repeat 12 lead in the AM. Continue telemetry. Spoke with medicine team regarding plan.

## 2014-06-04 NOTE — Evaluation (Signed)
Physical Therapy Evaluation Patient Details Name: [name] MarvelGertrude S Argabright MRN: [id number] DOB: 25-Apr-1932 Today's Date: 06/04/2014   History of Present Illness  Pt is a 80 y.o. female presenting with 1 week history of worsening shortness of breath, fever up to 101 that got better with tylenol. Patient improved at first. Next few days she developed diarrhea that lasted about 24 hours. Patient had Hx of COPD and asthma. Usually patient is on 5 L of oxygen at home but yesterday her family increased it to 6L. Patient presented with worsening shortness of breath requiring continuous nebulizer while in ER. Pt admitted for COPD exacerbation.  Clinical Impression  Pt admitted with the above. Pt currently with functional limitations due to the deficits listed below (see PT Problem List). At the time of PT eval pt was able to perform transfers and ambulation with supervision and frequent cues for safety. Pt will benefit from skilled PT to increase their independence and safety with mobility to allow discharge to the venue listed below. Pt asking about d/c today. Discussed O2 sats decreasing with ambulation (to 78% on 6L/min supplemental O2), as well as need for cueing for safety awareness. Pt tends to rush when she is feeling SOB, or urgency to use the bathroom, which increases fall risk. Noted x2 uncontrolled stand>sit transfers because of this. PT recommending 24 hour assist at home, with HHPT to follow. Pt agreeable.      Follow Up Recommendations Home health PT;Supervision/Assistance - 24 hour    Equipment Recommendations  None recommended by PT    Recommendations for Other Services       Precautions / Restrictions Precautions Precautions: Fall Restrictions Weight Bearing Restrictions: No      Mobility  Bed Mobility               General bed mobility comments: Pt sitting in recliner upon PT arrival.  Transfers Overall transfer level: Needs assistance Equipment used: Rolling walker (2  wheeled) Transfers: Sit to/from Stand Sit to Stand: Supervision         General transfer comment: Pt impulsive with transfers and required cueing for safety awareness.   Ambulation/Gait Ambulation/Gait assistance: Min guard Ambulation Distance (Feet): 100 Feet Assistive device: Rolling walker (2 wheeled) Gait Pattern/deviations: Step-through pattern;Decreased stride length;Trunk flexed;Trendelenburg Gait velocity: Decreased Gait velocity interpretation: Below normal speed for age/gender General Gait Details: Pt required cueing for general safety awareness, and assist for management of lines. Many standing rest breaks required for fatigue and decreasing O2 sats. Pt ambulated on 6L/min supplemental O2.   Stairs            Wheelchair Mobility    Modified Rankin (Stroke Patients Only)       Balance Overall balance assessment: Needs assistance Sitting-balance support: Feet supported;No upper extremity supported Sitting balance-Leahy Scale: Fair     Standing balance support: No upper extremity supported;During functional activity Standing balance-Leahy Scale: Fair                               Pertinent Vitals/Pain Pain Assessment: No/denies pain    Home Living Family/patient expects to be discharged to:: Private residence Living Arrangements: Children Available Help at Discharge: Family;Available PRN/intermittently Type of Home: House Home Access: Stairs to enter Entrance Stairs-Rails: Right Entrance Stairs-Number of Steps: 2 in the front. Level in the back Home Layout: One level Home Equipment: Bedside commode;Cane - single point;Walker - 4 wheels      Prior Function  Level of Independence: Independent         Comments: Still driving, daughter goes with pt to the grocery store. Does most of the cooking and cleaning. Assist for the heavy cleaning.      Hand Dominance   Dominant Hand: Right    Extremity/Trunk Assessment   Upper Extremity  Assessment: Defer to OT evaluation           Lower Extremity Assessment: Generalized weakness      Cervical / Trunk Assessment: Normal  Communication   Communication: HOH  Cognition Arousal/Alertness: Awake/alert Behavior During Therapy: WFL for tasks assessed/performed Overall Cognitive Status: Within Functional Limits for tasks assessed                      General Comments      Exercises        Assessment/Plan    PT Assessment Patient needs continued PT services  PT Diagnosis Difficulty walking;Generalized weakness   PT Problem List Decreased strength;Decreased range of motion;Decreased activity tolerance;Decreased balance;Decreased mobility;Decreased knowledge of use of DME;Decreased safety awareness;Decreased knowledge of precautions;Cardiopulmonary status limiting activity  PT Treatment Interventions DME instruction;Gait training;Stair training;Functional mobility training;Therapeutic activities;Therapeutic exercise;Neuromuscular re-education;Patient/family education   PT Goals (Current goals can be found in the Care Plan section) Acute Rehab PT Goals Patient Stated Goal: To return home today. PT Goal Formulation: With patient Time For Goal Achievement: 06/11/14 Potential to Achieve Goals: Good    Frequency Min 3X/week   Barriers to discharge        Co-evaluation               End of Session Equipment Utilized During Treatment: Gait belt;Oxygen Activity Tolerance: Patient limited by fatigue Patient left: in chair;with call bell/phone within reach Nurse Communication: Mobility status;Precautions         Time: [id number] PT Time Calculation (min): 28 min   Charges:   PT Evaluation $Initial PT Evaluation Tier I: 1 Procedure PT Treatments $Gait Training: 8-22 mins $Therapeutic Activity: 8-22 mins   PT G Codes:          Conni SlipperKirkman, Ebba Goll 06/04/2014, 3:47 PM  Conni SlipperLaura Astha Probasco, PT, DPT Acute Rehabilitation Services Pager:  [phone]

## 2014-06-04 NOTE — Care Management Note (Addendum)
    Page 1 of 1   06/05/2014     2:16:36 PM CARE MANAGEMENT NOTE 06/05/2014  Patient:  [name],[name]   Account Number:  [id number]  Date Initiated:  06/04/2014  Documentation initiated by:  Junius CreamerWELL,DEBBIE  Subjective/Objective Assessment:   adm w resp distress     Action/Plan:   lives w fam, pcp dr Lucky Cowboywilliam [place], chart states has home o2.   Anticipated DC Date:     Anticipated DC Plan:  HOME W HOME HEALTH SERVICES      DC Planning Services  CM consult      [facility]   Choice offered to / List presented to:  C-1 Patient        HH arranged  HH-1 RN  HH-2 PT      [facility]  [facility]   Status of service:   Medicare Important Message given?  YES (If response is "NO", the following Medicare IM given date fields will be blank) Date Medicare IM given:  06/04/2014 Medicare IM given by:  Junius CreamerWELL,DEBBIE Date Additional Medicare IM given:   Additional Medicare IM given by:    Discharge Disposition:  HOME W HOME HEALTH SERVICES  Per UR Regulation:  Reviewed for med. necessity/level of care/duration of stay  If discussed at Long Length of Stay Meetings, dates discussed:   06/06/2014    Comments:  10/14 1415 debbie Hasaan Radde rn,bsn spoke w pt. she has fam that live w her. hx of hhc and felt it was [facility] and would like to use them again for hhc. ref to donna w [facility] for hhrn and hhpt.

## 2014-06-04 NOTE — Progress Notes (Signed)
[facility]                                                                              Patient Demographics  [name], is a 80 y.o. female, DOB - 04/30/1932, ZOX:[id number]  Admit date - 06/01/2014   Admitting Physician Therisa Doyne, MD  Outpatient Primary MD for the patient is Nadean Corwin, MD  LOS - 3   Chief Complaint  Patient presents with  . Weakness      HPI on 06/01/2014 80 year old female with past medical history of CHF, atrial fibrillation, diabetes mellitus, hypertension, COPD that presented to the emergency department with a history of worsening shortness of breath as well as fever for one week. Patient's fever did go to 101F however resolved with Tylenol. Patient initially was improving at home however over the next few days, she developed diarrhea which lasted 24 hours and has since resolved. Patient has history of COPD and asthma for which she sees Dr. Sherene Sires, and is maintained on daily prednisone 10 mg per day. Patient also uses 5 L of oxygen at home, her family did try to increase her to 6 L however this did not help her shortness of breath. She presented with worsening short of breath to the emergency department and required continuous nebulizer treatment. Patient was also found to have increased creatinine up to 2.7 as well as an elevated troponin. She denied chest pain upon admission.  Assessment & Plan   Acute on chronic respiratory failure possibly secondary to COPD exacerbation versus CHF -Patient currently on nonrebreather, will attempt to wean to nasal cannula.  -Continue DuoNeb treatments, Solu-Medrol, guaifenesin, Pulmicort  -Patient initially placed on azithromycin and ceftriaxone however this was switched to Levaquin  -Patient did have an elevated BNP of 14,000 upon admission, however clinically patient does not appear to be volume overloaded. (BNP not accurate measure of CHF given her CKD)  -Chest x-ray showed atelectasis in both  lungs, no pneumonia or pulmonary edema  -Echocardiogram: Reduced EF from prior Echo: EF 45% ?Takotsubo cardiomyopathy  -Suspect patient has chronically low oxygen saturations as she requires 5L at home.  -?PE but patient is anticoagulated on coumadin  -Pulmonology and cardiology consulted and appreciated  -Consulted physical therapy and they recommended home health -Patient will be discharged with Levaquin, prednisone taper, Pulmicort, guaifenesin  Elevated troponin likely demand ischemia -Patient was noted to have an elevated 0.7 upon admission however this isn't intended in this come down to less than 0.3. -Patient currently is chest pain free. No EKG changes noted. -Case was discussed by the admitting physician and cardiology. -Echocardiogram: EF 45% ?Takotsubo cardiomyopathy  Acute on chronic kidney disease, stage 3 -Possibly secondary to dehydration versus diuretics -Creatinine improving -UO in the past 24hrs 1375cc -Patient given one dose of lasix, 40mg  IV and transitioned to PO  Chronic diastolic heart failure -Echocardiogram in April 2015 shows EF of 55-60%, unable to evaluate diastolic dysfunction -Echocardiogram 06/02/2014: EF 45% ?Takotsubo cardiomyopathy -Patient did have an elevated BNP upon admission however appears to be euvolemic at this time -Will continue to monitor her daily weights, intake and output. -Cardiology consulted and appreciated  Brief episode of diarrhea -Resolved  Hypertension -Currently  stable, Lasix held -Will add on medications if needed  Atrial fibrillation -Currently rate controlled, on no medications, has pacemaker -Continue Coumadin per pharmacy for anticoagulation  History of complete heart block -Stable, patient has pacemaker in place  Hyperglycemia in the setting of diet controlled diabetes -Patient's last hemoglobin A1c was 7.4 in July 2015 -Continue insulin scale with CBG monitoring -Likely elevated secondary to  Solu-Medrol  Hyperlipidemia -Continue statin  Leukocytosis -Likely reactive to steroids -Trending downward  Code Status: DO NOT RESUSCITATE  Family Communication: None at bedside, attempted to call daughter.   Disposition Plan: Admitted  Time Spent in minutes   30 minutes  Procedures  Echocardiogram  Consults   Cardiology Pulmonology  DVT Prophylaxis  Coumadin  Lab Results  Component Value Date   PLT 139* 06/04/2014    Medications  Scheduled Meds: . atorvastatin  40 mg Oral q1800  . budesonide  0.25 mg Nebulization Q6H  . furosemide  40 mg Oral Daily  . guaiFENesin  600 mg Oral BID  . insulin aspart  0-15 Units Subcutaneous TID WC  . insulin aspart  0-5 Units Subcutaneous QHS  . ipratropium-albuterol  3 mL Nebulization Q6H  . [START ON 06/05/2014] levofloxacin  500 mg Oral Q48H  . methylPREDNISolone (SOLU-MEDROL) injection  60 mg Intravenous Q12H  . montelukast  10 mg Oral QHS  . PARoxetine  20 mg Oral Daily  . sodium chloride  3 mL Intravenous Q12H  . warfarin  1 mg Oral ONCE-1800  . Warfarin - Pharmacist Dosing Inpatient   Does not apply q1800   Continuous Infusions:  PRN Meds:.acetaminophen, acetaminophen, HYDROcodone-acetaminophen, ondansetron (ZOFRAN) IV, ondansetron  Antibiotics    Anti-infectives   Start     Dose/Rate Route Frequency Ordered Stop   06/05/14 1000  levofloxacin (LEVAQUIN) tablet 500 mg     500 mg Oral Every 48 hours 06/03/14 1153     06/05/14 0000  levofloxacin (LEVAQUIN) 500 MG tablet     500 mg Oral Every 48 hours 06/04/14 1300 06/09/14 2359   06/03/14 1300  levofloxacin (LEVAQUIN) tablet 750 mg     750 mg Oral  Once 06/03/14 1153 06/03/14 1300   06/01/14 2215  cefTRIAXone (ROCEPHIN) 1 g in dextrose 5 % 50 mL IVPB  Status:  Discontinued     1 g 100 mL/hr over 30 Minutes Intravenous Every 24 hours 06/01/14 2211 06/03/14 1051   06/01/14 2215  azithromycin (ZITHROMAX) 500 mg in dextrose 5 % 250 mL IVPB  Status:  Discontinued      500 mg 250 mL/hr over 60 Minutes Intravenous Every 24 hours 06/01/14 2211 06/03/14 1051      Subjective:   [name] seen and examined today.  Patient states she is feeling better today and is ready to go home.  She feels that she can rest more at home.  She denies cough, chest pain, abdominal pain.  Patient states her shortness of breath has improved as compared to prior days.  Objective:   Filed Vitals:   06/04/14 0902 06/04/14 1155 06/04/14 1300 06/04/14 1444  BP:  131/50    Pulse:      Temp:  98.1 F (36.7 C)    TempSrc:  Oral    Resp:  20    Height:      Weight:      SpO2: 90% 92% 90% 91%    Wt Readings from Last 3 Encounters:  06/02/14 84.369 kg (186 lb)  03/14/14 85.276 kg (188 lb)  01/07/14  83.462 kg (184 lb)     Intake/Output Summary (Last 24 hours) at 06/04/14 1604 Last data filed at 06/04/14 0034  Gross per 24 hour  Intake      0 ml  Output    200 ml  Net   -200 ml   Exam  General: Well developed, well nourished, NAD, appears stated age  HEENT: NCAT, mucous membranes moist.  Cardiovascular: S1 S2 auscultated, Irregular  Respiratory: Clear to auscultation, comfortable on [place]  Abdomen: Soft, nontender, nondistended, + bowel sounds  Extremities: warm dry without cyanosis clubbing, no edema  Neuro: AAOx3, no focal deficits  Psych: Normal affect and demeanor   Data Review   Micro Results Recent Results (from the past 240 hour(s))  MRSA PCR SCREENING     Status: None   Collection Time    06/02/14  6:59 AM      Result Value Ref Range Status   MRSA by PCR NEGATIVE  NEGATIVE Final   Comment:            The GeneXpert MRSA Assay (FDA     approved for NASAL specimens     only), is one component of a     comprehensive MRSA colonization     surveillance program. It is not     intended to diagnose MRSA     infection nor to guide or     monitor treatment for     MRSA infections.    Radiology Reports Dg Chest Port 1 View  06/01/2014   CLINICAL  DATA:  Shortness of breath  EXAM: PORTABLE CHEST - 1 VIEW  COMPARISON:  December 09, 2013  FINDINGS: The heart size and mediastinal contours are stable. The heart size is enlarged. Cardiac pacemaker is unchanged. There are mild atelectasis of the right upper lobe and left mid to lower lung. There is no focal pneumonia, pulmonary edema, or pleural effusion. The visualized skeletal structures are stable.  IMPRESSION: Mild atelectasis of both lungs as described. No focal pneumonia or pulmonary edema.   Electronically Signed   By: Sherian Rein M.D.   On: 06/01/2014 17:54    CBC  Recent Labs Lab 06/01/14 1649 06/01/14 1656 06/02/14 06/03/14 0230 06/04/14 0221  WBC 8.3  --  7.5 14.6* 12.3*  HGB 12.0 13.3 11.8* 10.8* 11.1*  HCT 39.5 39.0 37.8 34.4* 35.5*  PLT 103*  --  150 120* 139*  MCV 98.8  --  102.2* 96.9 96.2  MCH 30.0  --  31.9 30.4 30.1  MCHC 30.4  --  31.2 31.4 31.3  RDW 15.2  --  15.8* 15.1 15.0  LYMPHSABS 0.8  --   --   --   --   MONOABS 0.8  --   --   --   --   EOSABS 0.0  --   --   --   --   BASOSABS 0.0  --   --   --   --     Chemistries   Recent Labs Lab 06/01/14 1649 06/01/14 1656 06/02/14 06/03/14 0230 06/04/14 0221  NA  --  139 137 137 141  K  --  3.9 4.2 4.3 4.7  CL  --  101 91* 96 97  CO2  --   --  20 28 29   GLUCOSE  --  222* 400* 263* 216*  BUN  --  29* 32* 46* 51*  CREATININE  --  2.50* 2.66* 2.25* 2.11*  CALCIUM  --   --  8.6 9.1 9.2  MG  --   --  2.3  --   --   AST 32  --  31  --   --   ALT 11  --  10  --   --   ALKPHOS 67  --  64  --   --   BILITOT 0.7  --  0.3  --   --    ------------------------------------------------------------------------------------------------------------------ estimated creatinine clearance is 21.9 ml/min (by C-G formula based on Cr of 2.11). ------------------------------------------------------------------------------------------------------------------ No results found for this basename: HGBA1C,  in the last 72  hours ------------------------------------------------------------------------------------------------------------------ No results found for this basename: CHOL, HDL, LDLCALC, TRIG, CHOLHDL, LDLDIRECT,  in the last 72 hours ------------------------------------------------------------------------------------------------------------------  Recent Labs  06/02/14  TSH 0.409   ------------------------------------------------------------------------------------------------------------------ No results found for this basename: VITAMINB12, FOLATE, FERRITIN, TIBC, IRON, RETICCTPCT,  in the last 72 hours  Coagulation profile  Recent Labs Lab 06/01/14 1649 06/02/14 06/03/14 0230 06/04/14 0221  INR 2.17* 2.06* 2.91* 2.80*    No results found for this basename: DDIMER,  in the last 72 hours  Cardiac Enzymes  Recent Labs Lab 06/02/14 06/02/14 0600 06/02/14 1355  TROPONINI 0.48* <0.30 <0.30   ------------------------------------------------------------------------------------------------------------------ No components found with this basename: POCBNP,     Cebastian Neis D.O. on 06/04/2014 at 4:04 PM  Between 7am to 7pm - Pager - [phone]  After 7pm go to [email or web address] - password [email or web address]  And look for the night coverage person covering for me after hours  [facility] Office  [phone]

## 2014-06-04 NOTE — Progress Notes (Signed)
Feels better. No new complaints. Comfortable on [place] O2  Filed Vitals:   06/04/14 1300 06/04/14 1444 06/04/14 1630 06/04/14 1815  BP:   135/58   Pulse:      Temp:   97.7 F (36.5 C)   TempSrc:   Oral   Resp:      Height:      Weight:      SpO2: 90% 91% 94% 88%   NAD Bibasilar crackles, no wheezes IRIR, no M noted Soft, +BS No LE edema  I have reviewed all of today's lab results. Relevant abnormalities are discussed in the A/P section  CXR: new linear density in mid L lung  IMPRESSION: Acute on chronic hypoxic resp failure - improving  Baseline O2 reqt 5 lpm by [place]  Presently on 6 lpm with SpO2 in low 90s  Doubt PE (fully anticoagulated prior to admission)  Possible PNA (presented with fever, now with opacity in L mid lung) COPD without wheezing CHF Acute on CKD - Cr improved  PLAN/REC: Cont O2 and scheduled BDs Complete 7 days abx No need to eval PE  PCCM will sign off. Please call if we can be of further assistance  Billy Fischeravid Reni Hausner, MD ; [facility] service Mobile [phone].  After 5:30 PM or weekends, call [phone]

## 2014-06-05 ENCOUNTER — Inpatient Hospital Stay (HOSPITAL_COMMUNITY): Payer: Medicare Other

## 2014-06-05 DIAGNOSIS — Z9229 Personal history of other drug therapy: Secondary | ICD-10-CM

## 2014-06-05 DIAGNOSIS — I10 Essential (primary) hypertension: Secondary | ICD-10-CM

## 2014-06-05 LAB — COMPREHENSIVE METABOLIC PANEL
ALBUMIN: 3 g/dL — AB (ref 3.5–5.2)
ALK PHOS: 91 U/L (ref 39–117)
ALT: 11 U/L (ref 0–35)
ANION GAP: 15 (ref 5–15)
AST: 22 U/L (ref 0–37)
BUN: 56 mg/dL — ABNORMAL HIGH (ref 6–23)
CO2: 29 mEq/L (ref 19–32)
CREATININE: 2.12 mg/dL — AB (ref 0.50–1.10)
Calcium: 9.2 mg/dL (ref 8.4–10.5)
Chloride: 98 mEq/L (ref 96–112)
GFR calc Af Amer: 24 mL/min — ABNORMAL LOW (ref >90)
GFR calc non Af Amer: 21 mL/min — ABNORMAL LOW (ref >90)
Glucose, Bld: 278 mg/dL — ABNORMAL HIGH (ref 70–99)
POTASSIUM: 3.7 meq/L (ref 3.7–5.3)
Sodium: 142 mEq/L (ref 137–147)
TOTAL PROTEIN: 6.2 g/dL (ref 6.0–8.3)
Total Bilirubin: 0.2 mg/dL — ABNORMAL LOW (ref 0.3–1.2)

## 2014-06-05 LAB — GLUCOSE, CAPILLARY
GLUCOSE-CAPILLARY: 326 mg/dL — AB (ref 70–99)
Glucose-Capillary: 265 mg/dL — ABNORMAL HIGH (ref 70–99)
Glucose-Capillary: 142 mg/dL — ABNORMAL HIGH (ref 70–99)
Glucose-Capillary: 313 mg/dL — ABNORMAL HIGH (ref 70–99)

## 2014-06-05 LAB — BASIC METABOLIC PANEL
Anion gap: 14 (ref 5–15)
BUN: 55 mg/dL — ABNORMAL HIGH (ref 6–23)
CALCIUM: 9 mg/dL (ref 8.4–10.5)
CO2: 27 meq/L (ref 19–32)
Chloride: 98 mEq/L (ref 96–112)
Creatinine, Ser: 1.98 mg/dL — ABNORMAL HIGH (ref 0.50–1.10)
GFR calc Af Amer: 26 mL/min — ABNORMAL LOW (ref >90)
GFR calc non Af Amer: 23 mL/min — ABNORMAL LOW (ref >90)
Glucose, Bld: 226 mg/dL — ABNORMAL HIGH (ref 70–99)
POTASSIUM: 4.4 meq/L (ref 3.7–5.3)
SODIUM: 139 meq/L (ref 137–147)

## 2014-06-05 LAB — MAGNESIUM: MAGNESIUM: 2.6 mg/dL — AB (ref 1.5–2.5)

## 2014-06-05 LAB — PROTIME-INR
INR: 2.67 — ABNORMAL HIGH (ref 0.00–1.49)
Prothrombin Time: 28.4 seconds — ABNORMAL HIGH (ref 11.6–15.2)

## 2014-06-05 MED ORDER — AZITHROMYCIN 250 MG PO TABS
250.0000 mg | ORAL_TABLET | Freq: Every day | ORAL | Status: DC
  Administered 2014-06-05: 250 mg via ORAL
  Filled 2014-06-05: qty 1

## 2014-06-05 MED ORDER — PREDNISONE 20 MG PO TABS
40.0000 mg | ORAL_TABLET | Freq: Every day | ORAL | Status: DC
  Administered 2014-06-05 – 2014-06-06 (×2): 40 mg via ORAL
  Filled 2014-06-05 (×4): qty 2

## 2014-06-05 MED ORDER — GLYBURIDE 2.5 MG PO TABS
2.5000 mg | ORAL_TABLET | Freq: Every day | ORAL | Status: DC
  Filled 2014-06-05 (×2): qty 1

## 2014-06-05 MED ORDER — WARFARIN SODIUM 2.5 MG PO TABS
2.5000 mg | ORAL_TABLET | Freq: Every day | ORAL | Status: DC
  Administered 2014-06-05 – 2014-06-06 (×2): 2.5 mg via ORAL
  Filled 2014-06-05 (×4): qty 1

## 2014-06-05 MED ORDER — LINAGLIPTIN 5 MG PO TABS
5.0000 mg | ORAL_TABLET | Freq: Every day | ORAL | Status: DC
  Administered 2014-06-05 – 2014-06-07 (×3): 5 mg via ORAL
  Filled 2014-06-05 (×3): qty 1

## 2014-06-05 NOTE — Progress Notes (Signed)
Physical Therapy Treatment Patient Details Name: [name] MarvelGertrude S Delaughter MRN: [id number] DOB: 06-10-1932 Today's Date: 06/05/2014    History of Present Illness Pt is a 80 y.o. female presenting with 1 week history of worsening shortness of breath, fever up to 101 that got better with tylenol. Patient improved at first. Next few days she developed diarrhea that lasted about 24 hours. Patient had Hx of COPD and asthma. Usually patient is on 5 L of oxygen at home but yesterday her family increased it to 6L. Patient presented with worsening shortness of breath requiring continuous nebulizer while in ER. Pt admitted for COPD exacerbation. Pt with episode of VT 10/13.    PT Comments    Pt progressing towards physical therapy goals. Continues to require assist and cues for general safety awareness. Will need 24 hour assist at home as pt is a fall risk. Pt states she is trying to figure out who will coming to stay with her at d/c.   Follow Up Recommendations  Home health PT;Supervision/Assistance - 24 hour     Equipment Recommendations  None recommended by PT    Recommendations for Other Services       Precautions / Restrictions Precautions Precautions: Fall Restrictions Weight Bearing Restrictions: No    Mobility  Bed Mobility Overal bed mobility: Modified Independent             General bed mobility comments: No physical assist required to transition to EOB. Pt required increased time and use of bed rails for support.   Transfers Overall transfer level: Needs assistance Equipment used: Rolling walker (2 wheeled) Transfers: Sit to/from Stand Sit to Stand: Supervision         General transfer comment: Impulsive due to pt rushing to get to bathroom to void. Pt was cued for general safety awareness.   Ambulation/Gait Ambulation/Gait assistance: Supervision Ambulation Distance (Feet): 30 Feet Assistive device: Rolling walker (2 wheeled) Gait Pattern/deviations: Step-through  pattern;Decreased stride length;Trunk flexed Gait velocity: Decreased Gait velocity interpretation: Below normal speed for age/gender General Gait Details: Pt required cues for safety with lines and for controlled descent to chair.    Stairs            Wheelchair Mobility    Modified Rankin (Stroke Patients Only)       Balance Overall balance assessment: Needs assistance Sitting-balance support: Feet supported;No upper extremity supported Sitting balance-Leahy Scale: Fair     Standing balance support: Bilateral upper extremity supported Standing balance-Leahy Scale: Fair                      Cognition Arousal/Alertness: Awake/alert Behavior During Therapy: WFL for tasks assessed/performed Overall Cognitive Status: Within Functional Limits for tasks assessed                      Exercises      General Comments        Pertinent Vitals/Pain Pain Assessment: No/denies pain    Home Living                      Prior Function            PT Goals (current goals can now be found in the care plan section) Acute Rehab PT Goals Patient Stated Goal: return home  PT Goal Formulation: With patient Time For Goal Achievement: 06/11/14 Potential to Achieve Goals: Good Progress towards PT goals: Progressing toward goals    Frequency  Min 3X/week  PT Plan Current plan remains appropriate    Co-evaluation             End of Session Equipment Utilized During Treatment: Gait belt;Oxygen Activity Tolerance: Patient limited by fatigue Patient left: in chair;with call bell/phone within reach     Time: 1340-1400 PT Time Calculation (min): 20 min  Charges:  $Gait Training: 8-22 mins                    G Codes:      Conni SlipperKirkman, Ameera Tigue 06/05/2014, 4:16 PM  Conni SlipperLaura Joylynn Defrancesco, PT, DPT Acute Rehabilitation Services Pager: [phone]

## 2014-06-05 NOTE — Progress Notes (Signed)
Occupational Therapy Evaluation Patient Details Name: [name] MarvelGertrude S Hanback MRN: [id number] DOB: 1932/07/11 Today's Date: 06/05/2014    History of Present Illness Pt is a 80 y.o. female presenting with 1 week history of worsening shortness of breath, fever up to 101 that got better with tylenol. Patient improved at first. Next few days she developed diarrhea that lasted about 24 hours. Patient had Hx of COPD and asthma. Usually patient is on 5 L of oxygen at home but yesterday her family increased it to 6L. Patient presented with worsening shortness of breath requiring continuous nebulizer while in ER. Pt admitted for COPD exacerbation. Pt with episode of VT 10/13.   Clinical Impression   Pt was performing self care and light household activities prior to admission.  He family assisted with some heavy housework and cooking and accompanied her to the grocery store.  Pt presents with generalized weakness and decreased activity tolerance.  Began instruction in energy conservation strategies and recommended pt consider ceasing getting down in her tub and opt for a shower seat.  Will follow acutely.    Follow Up Recommendations   (Pt declined HHOT.) 24 hour supervision initially   Equipment Recommendations  None recommended by OT    Recommendations for Other Services       Precautions / Restrictions Precautions Precautions: Fall      Mobility Bed Mobility Overal bed mobility: Modified Independent                Transfers Overall transfer level: Needs assistance Equipment used: Rolling walker (2 wheeled)   Sit to Stand: Supervision         General transfer comment: somewhat impulsive compromising safety    Balance     Sitting balance-Leahy Scale: Fair       Standing balance-Leahy Scale: Fair                              ADL Overall ADL's : Needs assistance/impaired Eating/Feeding: Independent;Sitting   Grooming: Wash/dry  hands;Standing;Supervision/safety   Upper Body Bathing: Set up;Sitting   Lower Body Bathing: Supervison/ safety;Sit to/from stand   Upper Body Dressing : Set up;Sitting   Lower Body Dressing: Supervision/safety;Sit to/from stand   Toilet Transfer: Supervision/safety;Stand-pivot;BSC;RW   Toileting- ArchitectClothing Manipulation and Hygiene: Supervision/safety;Sit to/from stand         General ADL Comments: Recommended pt bring her foot to opposite knee and avoid bending to reach LEs.  Pt open to recommendation of a shower seat and to stop getting down in tub.  Agreeable to having someone with her when she showers.     Vision                     Perception     Praxis      Pertinent Vitals/Pain Pain Assessment: No/denies pain     Hand Dominance Right   Extremity/Trunk Assessment Upper Extremity Assessment Upper Extremity Assessment: Overall WFL for tasks assessed   Lower Extremity Assessment Lower Extremity Assessment: Defer to PT evaluation   Cervical / Trunk Assessment Cervical / Trunk Assessment: Normal   Communication Communication Communication: HOH   Cognition Arousal/Alertness: Awake/alert Behavior During Therapy: WFL for tasks assessed/performed Overall Cognitive Status: Within Functional Limits for tasks assessed                     General Comments       Exercises  Shoulder Instructions      Home Living Family/patient expects to be discharged to:: Private residence Living Arrangements: Children;Other (Comment) (son works days) Available Help at Discharge: Family;Available PRN/intermittently;Other (Comment) (can arrange 24 hours if needed) Type of Home: House Home Access: Stairs to enter Entrance Stairs-Number of Steps: 2 in the front. Level in the back Entrance Stairs-Rails: Right Home Layout: One level     Bathroom Shower/Tub: Tub/shower unit Shower/tub characteristics: Engineer, building servicesCurtain Bathroom Toilet: Standard     Home  Equipment: Bedside commode;Cane - single point;Walker - 4 wheels;Other (comment);Hand held shower head;Grab bars - tub/shower (02)   Additional Comments: recommended shower seat      Prior Functioning/Environment Level of Independence: Needs assistance  Gait / Transfers Assistance Needed: was using RW prior to admission ADL's / Homemaking Assistance Needed: went to grocery store with daughter and used motorized w/c, got down in tub   Comments: granddaughter or daughter helps with heavy housework    OT Diagnosis: Generalized weakness   OT Problem List: Decreased activity tolerance;Impaired balance (sitting and/or standing);Decreased knowledge of use of DME or AE;Decreased safety awareness;Cardiopulmonary status limiting activity   OT Treatment/Interventions: Self-care/ADL training;Patient/family education;Energy conservation;DME and/or AE instruction    OT Goals(Current goals can be found in the care plan section) Acute Rehab OT Goals Patient Stated Goal: return home  OT Goal Formulation: With patient Time For Goal Achievement: 06/12/14 Potential to Achieve Goals: Good ADL Goals Pt Will Perform Grooming: with modified independence;standing Pt Will Perform Lower Body Bathing: with modified independence;sit to/from stand Pt Will Perform Lower Body Dressing: with modified independence;sit to/from stand Pt Will Transfer to Toilet: with modified independence;ambulating;regular height toilet Pt Will Perform Toileting - Clothing Manipulation and hygiene: with modified independence;sit to/from stand Pt Will Perform Tub/Shower Transfer: with modified independence;ambulating;shower seat;grab bars;rolling walker;Tub transfer Additional ADL Goal #1: Pt will state at least 3 energy conservation strategies during ADL and mobility.  OT Frequency: Min 2X/week   Barriers to D/C:            Co-evaluation              End of Session Equipment Utilized During Treatment: Market researcherolling  walker Nurse Communication: Other (comment) (ok to give diet Coke)  Activity Tolerance: Patient tolerated treatment well Patient left: in bed;with call bell/phone within reach   Time: 1033-1112 OT Time Calculation (min): 39 min Charges:  OT General Charges $OT Visit: 1 Procedure OT Evaluation $Initial OT Evaluation Tier I: 1 Procedure OT Treatments $Self Care/Home Management : 8-22 mins G-Codes:    Evern BioMayberry, Lane Eland Lynn 06/05/2014, 11:15 AM [phone]

## 2014-06-05 NOTE — Progress Notes (Signed)
[facility]  Salvatore MarvelGertrude S Tristan AOZ:[id number]:[id number] DOB: 05/26/1932 DOA: 06/01/2014 PCP: [name],[name], MD  Brief narrative: 80 year old female patient with a history of CHF, atrial fibrillation, diabetes mellitus, hypertension and COPD. She presented to the emergency department with progressive shortness of breath associated with fever x1 week. 24hrs prior to presentation she developed diarrhea but this had resolved by the time she presented to the emergency department. She is on chronic prednisone 10 mg daily as well as 5 L nasal cannula oxygen. She had increased her oxygen up to 6 L without any improvement in her symptoms.  Upon presentation to the emergency department the patient was hypoxic and actively wheezing. She does have chronic kidney disease but her creatinine was much higher than baseline at 2.7. She also had a mildly elevated troponin.  Since admission patient has been treated for both CHF as well as COPD exacerbation. She was initially started on Zithromax and ceftriaxone but was switched to Levaquin. She was also given high dose IV steroids and continued on nebulizer therapies. Chest x-ray revealed no definitive infiltrates. She was getting up with assistance and PT recommended home health physical therapy. Plans were to tentatively discharged the patient on 10/14.  Overnight into10/14 patient developed a brief run of multifocal/polymorphic ventricular tachycardia. It was felt that this was related to the patient's Levaquin i.e. prolonged QTc therefore this medication was discontinued. Patient continued with frequent PVCs on 10/14 so was felt she needed to remain hospitalized for at least an additional 24 hours to follow for recurrence of polymorphic V. tach. She also has had hemoglobin A1c persistently greater than 7 - upon questioning she endorsed having been inconsistent in taking oral diabetes medication. She apparently has glyburide at home  which she takes inconsistently but also has a documented sulfonylurea allergy so on 10/14 it was opted to begin [place].  HPI/Subjective: Patient alert and without any specific complaints. Initially she informed me that she does have glyburide at home, dosage unknown, and if her sugar gets high she takes at least a half a pill. When I returned to discuss the sulfa allergy she then says she was started on some other new diabetes medicine that made her go to the bathroom a lot so she quit taking that one.  Assessment/Plan:  Polymorphic VT with prolonged QTC Appreciate cardiology assistance - suspect Levaquin as etiology - continues with frequent PVCs - potassium normal  Hypertension Moderate control-continue Lasix  Atrial fibrillation Currently rate controlled but is having frequent PVCs-potassium greater than 4 and Mg 2.6  Acute on chronic respiratory failure with hypoxemia:   A) COPD exacerbation   B) Possible community-acquired pneumonia   C) ? Acute Takotsubo syndrome   D) Chronic diastolic heart failure No definitive infiltrate initially - will repeat chest x-ray especially in light of need to discontinue antibiotics because of arrhythmia - continue nebs - transition Solu-Medrol to prednisone 40 mg by mouth daily for the next 4 days then resume usual 10 mg daily dose - in regards to heart failure echo shows hypokinesis of the entire LV and RV apex so consideration of Takotsubo syndrome; if so will need followup echo in a few months  DM2 (diabetes mellitus, type 2) Poorly controlled and likely exacerbated by higher dose of chronic steroids - was not consistently taking oral hypoglycemic agents at home and given GFR oral agents are limited - will try Tradgenta but if develops side effects such as severe diarrhea patient  will likely need to discharge home on low-dose long-acting insulin  Acute on chronic renal failure, stage IV Creatinine not at baseline of 21/1.75 - BUN elevated related  to higher steroid dose - does not appear to be volume depleted but will monitor closely   Elevated troponin only mildly elevated at 0.75 and has recently trended down to normal - suspect related to chronic kidney disease as well as mild demand ischemia - echo consistent with likely takotsubo syndrome    DVT prophylaxis: warfarin Code Status:  DO NOT RESUSCITATE Family Communication:  No family at bedside Disposition Plan/Expected LOS: transfer to telemetry but keep on team 1 for possible discharge on 10/15  Consultants:  Cardiology  Procedures:  2-D echocardiogram: - Left ventricle: The cavity size was normal. Wall thickness was normal. The estimated ejection fraction was 45%. There is hypokinesis of the apical myocardium. - Right ventricle: Systolic function was mildly reduced. Hypokinesis of the RV apex. - Impressions: There is hypokinesis oif the entire LV and RV apex. Consider possible Tako-Tsubo cardiomyopathy.  Cultures:  None  Antibiotics: Rocephin 10/10>>10/13 Zithromax 10/10>>10/12 Levaquin 10/12>>10/14  Objective: Blood pressure 153/65, pulse 70, temperature 97.8 F (36.6 C), temperature source Oral, resp. rate 21, height 5\' 3"  (1.6 m), weight 186 lb (84.369 kg), SpO2 91.00%.  Intake/Output Summary (Last 24 hours) at 06/05/14 1449 Last data filed at 06/05/14 1200  Gross per 24 hour  Intake    500 ml  Output    550 ml  Net    -50 ml   Exam: Gen: No acute respiratory distress Chest: Coarse to auscultation with scattered expiratory wheezes, 6 L nasal cannula ox  Cardiac: Irregular rate and rhythm with frequent unit PVCs, S1-S2, no rubs murmurs or gallops, trace lower extremity peripheral edema, no JVD Abdomen: Soft nontender nondistended without obvious hepatosplenomegaly, no ascites Extremities: Symmetrical in appearance without cyanosis, clubbing or effusion  Scheduled Meds:  Scheduled Meds: . atorvastatin  40 mg Oral q1800  . budesonide  0.25 mg  Nebulization Q6H  . furosemide  40 mg Oral Daily  . guaiFENesin  600 mg Oral BID  . insulin aspart  0-15 Units Subcutaneous TID WC  . insulin aspart  0-5 Units Subcutaneous QHS  . ipratropium-albuterol  3 mL Nebulization Q6H  . linagliptin  5 mg Oral Daily  . montelukast  10 mg Oral QHS  . predniSONE  40 mg Oral Q breakfast  . sodium chloride  3 mL Intravenous Q12H  . warfarin  2.5 mg Oral q1800  . Warfarin - Pharmacist Dosing Inpatient   Does not apply q1800   Data Reviewed: Basic Metabolic Panel:  Recent Labs Lab 06/02/14 06/03/14 0230 06/04/14 0221 06/04/14 2308 06/05/14 0126  NA 137 137 141 142 139  K 4.2 4.3 4.7 3.7 4.4  CL 91* 96 97 98 98  CO2 20 28 29 29 27   GLUCOSE 400* 263* 216* 278* 226*  BUN 32* 46* 51* 56* 55*  CREATININE 2.66* 2.25* 2.11* 2.12* 1.98*  CALCIUM 8.6 9.1 9.2 9.2 9.0  MG 2.3  --   --   --  2.6*  PHOS 5.1*  --   --   --   --    Liver Function Tests:  Recent Labs Lab 06/01/14 1649 06/02/14 06/04/14 2308  AST 32 31 22  ALT 11 10 11   ALKPHOS 67 64 91  BILITOT 0.7 0.3 0.2*  PROT 6.7 6.6 6.2  ALBUMIN 3.4* 3.2* 3.0*   CBC:  Recent Labs Lab 06/01/14  1649 06/01/14 1656 06/02/14 06/03/14 0230 06/04/14 0221  WBC 8.3  --  7.5 14.6* 12.3*  NEUTROABS 6.7  --   --   --   --   HGB 12.0 13.3 11.8* 10.8* 11.1*  HCT 39.5 39.0 37.8 34.4* 35.5*  MCV 98.8  --  102.2* 96.9 96.2  PLT 103*  --  150 120* 139*   Cardiac Enzymes:  Recent Labs Lab 06/01/14 2016 06/02/14 06/02/14 0600 06/02/14 1355  TROPONINI 0.75* 0.48* <0.30 <0.30   BNP (last 3 results)  Recent Labs  12/04/13 2258 06/02/14  PROBNP 17479.0* 13998.0*   CBG:  Recent Labs Lab 06/04/14 1156 06/04/14 1628 06/04/14 2148 06/05/14 0751 06/05/14 1129  GLUCAP 261* 246* 325* 265* 326*    Recent Results (from the past 240 hour(s))  MRSA PCR SCREENING     Status: None   Collection Time    06/02/14  6:59 AM      Result Value Ref Range Status   MRSA by PCR NEGATIVE  NEGATIVE  Final   Comment:            The GeneXpert MRSA Assay (FDA     approved for NASAL specimens     only), is one component of a     comprehensive MRSA colonization     surveillance program. It is not     intended to diagnose MRSA     infection nor to guide or     monitor treatment for     MRSA infections.     Studies:  Recent x-ray studies have been reviewed in detail by the Attending Physician  Time spent :  35 mins  Junious Silkllison Ellis, ANP [facility] Office  [phone] Pager [phone]  On-Call/Text Page:      [email or web address]      password [email or web address]  If 7PM-7AM, please contact night-coverage [email or web address] Password [email or web address] 06/05/2014, 2:49 PM   LOS: 4 days   I have personally examined this patient and reviewed the entire database. I have reviewed the above note, made any necessary editorial changes, and agree with its content.  Lonia BloodJeffrey T. Heru Montz, MD [facility]

## 2014-06-05 NOTE — Progress Notes (Signed)
Patient Name: [name] MarvelGertrude S Mccumbers Date of Encounter: 06/05/2014     Active Problems:   Hypertension   Atrial fibrillation   Chronic diastolic heart failure   Respiratory distress   COPD exacerbation   DM2 (diabetes mellitus, type 2)   Acute on chronic renal failure   Acute on chronic respiratory failure with hypoxemia   Elevated troponin   DM type 2 causing CKD stage 2    SUBJECTIVE  Feels okay this am. Ready to eat breakfast.  No chest pain. No further dizziness. No further polymorphic VT. Off drugs which prolong QT interval now. QTc still prolonged at 552 msec  CURRENT MEDS . atorvastatin  40 mg Oral q1800  . budesonide  0.25 mg Nebulization Q6H  . furosemide  40 mg Oral Daily  . guaiFENesin  600 mg Oral BID  . insulin aspart  0-15 Units Subcutaneous TID WC  . insulin aspart  0-5 Units Subcutaneous QHS  . ipratropium-albuterol  3 mL Nebulization Q6H  . methylPREDNISolone (SOLU-MEDROL) injection  60 mg Intravenous Q12H  . montelukast  10 mg Oral QHS  . sodium chloride  3 mL Intravenous Q12H  . Warfarin - Pharmacist Dosing Inpatient   Does not apply q1800    OBJECTIVE  Filed Vitals:   06/04/14 2352 06/05/14 0112 06/05/14 0420 06/05/14 0748  BP: 130/65  128/75 158/74  Pulse: 68  70 72  Temp: 97.8 F (36.6 C)  98.3 F (36.8 C) 97.5 F (36.4 C)  TempSrc: Oral  Oral Oral  Resp: 18  16 19   Height:      Weight:      SpO2: 91% 92% 93% 91%    Intake/Output Summary (Last 24 hours) at 06/05/14 0820 Last data filed at 06/05/14 0700  Gross per 24 hour  Intake      0 ml  Output    550 ml  Net   -550 ml   Filed Weights   06/01/14 1638 06/02/14 0650  Weight: 183 lb (83.008 kg) 186 lb (84.369 kg)    PHYSICAL EXAM  General: Pleasant, NAD. Neuro: Alert and oriented X 3. Moves all extremities spontaneously. Psych: Normal affect. HEENT:  Normal  Neck: Supple without bruits or JVD. Lungs:  Mild basilar rales. Heart: RRR no s3, s4, or murmurs. Abdomen: Soft,  non-tender, non-distended, BS + x 4.  Extremities: No clubbing, cyanosis or edema. DP/PT/Radials 2+ and equal bilaterally.  Accessory Clinical Findings  CBC  Recent Labs  06/03/14 0230 06/04/14 0221  WBC 14.6* 12.3*  HGB 10.8* 11.1*  HCT 34.4* 35.5*  MCV 96.9 96.2  PLT 120* 139*   Basic Metabolic Panel  Recent Labs  06/04/14 2308 06/05/14 0126  NA 142 139  K 3.7 4.4  CL 98 98  CO2 29 27  GLUCOSE 278* 226*  BUN 56* 55*  CREATININE 2.12* 1.98*  CALCIUM 9.2 9.0  MG  --  2.6*   Liver Function Tests  Recent Labs  06/04/14 2308  AST 22  ALT 11  ALKPHOS 91  BILITOT 0.2*  PROT 6.2  ALBUMIN 3.0*   No results found for this basename: LIPASE, AMYLASE,  in the last 72 hours Cardiac Enzymes  Recent Labs  06/02/14 1355  TROPONINI <0.30   BNP No components found with this basename: POCBNP,  D-Dimer No results found for this basename: DDIMER,  in the last 72 hours Hemoglobin A1C No results found for this basename: HGBA1C,  in the last 72 hours Fasting Lipid Panel No results  found for this basename: CHOL, HDL, LDLCALC, TRIG, CHOLHDL, LDLDIRECT,  in the last 72 hours Thyroid Function Tests No results found for this basename: TSH, T4TOTAL, FREET3, T3FREE, THYROIDAB,  in the last 72 hours  TELE  Atrial fibrillation with Vpacing.  ECG  Atrial fibrillation with Vpacing. QTc 552  Radiology/Studies  Dg Chest Portable 1 View  06/03/2014   CLINICAL DATA:  Hypoxia.  EXAM: PORTABLE CHEST - 1 VIEW  COMPARISON:  06/01/2014  FINDINGS: Left cardiac pacemaker and the leads appear to be stable. Negative for a pneumothorax. Mild enlargement of the cardiac silhouette is unchanged. Increased linear densities in the left mid lung. There appears to be decreased atelectasis in the right lung. The trachea is midline.  IMPRESSION: Increased densities in the left mid lung. Findings are most compatible with increased atelectasis in this region.   Electronically Signed   By: Richarda OverlieAdam  Henn  M.D.   On: 06/03/2014 12:46   Dg Chest Port 1 View  06/01/2014   CLINICAL DATA:  Shortness of breath  EXAM: PORTABLE CHEST - 1 VIEW  COMPARISON:  December 09, 2013  FINDINGS: The heart size and mediastinal contours are stable. The heart size is enlarged. Cardiac pacemaker is unchanged. There are mild atelectasis of the right upper lobe and left mid to lower lung. There is no focal pneumonia, pulmonary edema, or pleural effusion. The visualized skeletal structures are stable.  IMPRESSION: Mild atelectasis of both lungs as described. No focal pneumonia or pulmonary edema.   Electronically Signed   By: Sherian ReinWei-Chen  Lin M.D.   On: 06/01/2014 17:54    ASSESSMENT AND PLAN 1. Chronic systolic heart failure.  2. COPD  3. CKD stage 4  4. Atrial fibrillation, chronic , on warfarin. Has complete heart block with functioning pacemaker. 5. Polymorphic VT with prolonged QTc interval. Now off zithromax and levoquin and other meds which could effect QTc. Will defer restarting a new antibiotic to primary team.    Signed, Cassell Clementhomas Tanekia Ryans MD

## 2014-06-05 NOTE — Progress Notes (Signed)
Inpatient Diabetes Program Recommendations  AACE/ADA: New Consensus Statement on Inpatient Glycemic Control (2013)  Target Ranges:  Prepandial:   less than 140 mg/dL      Peak postprandial:   less than 180 mg/dL (1-2 hours)      Critically ill patients:  140 - 180 mg/dL   Results for [name], [name] (MRN [id number]) as of 06/05/2014 11:27  Ref. Range 06/04/2014 08:09 06/04/2014 11:56 06/04/2014 16:28 06/04/2014 21:48 06/05/2014 07:51  Glucose-Capillary Latest Range: 70-99 mg/dL 191258 (H) 478261 (H) 295246 (H) 325 (H) 265 (H)   Current orders for Inpatient glycemic control: Novolog 0-15 units AC, Novolog 0-5 HS, Tradjenta 5 mg daily, Glyburide 2.5 mg QAM (ordered today but not order not released yet)  Inpatient Diabetes Program Recommendations Insulin - Basal: Noted steroids changed to Prednisone and Glyburide ordered to start today.  May want to consider ordering low dose basal insulin.   Thanks, Orlando PennerMarie Patte Winkel, RN, MSN, CCRN Diabetes Coordinator Inpatient Diabetes Program [phone] (Team Pager) [phone] (AP office) [phone] [facility] office)

## 2014-06-05 NOTE — Progress Notes (Signed)
Pt transferred from 2 H Room 26 to 2 W  RM 10 .  Alert awake and oriented to person place and time and date.initial assessment done pt on  Oxygen 6 l /min  [place]. Pt denied SOB at present No c/o of pain . Pt oriented to room  and use of call light to call for assist. No acute distress noted . Cardiac monitor in use. Will continue to monitor.

## 2014-06-05 NOTE — Progress Notes (Signed)
ANTICOAGULATION CONSULT NOTE   Pharmacy Consult for Coumadin Indication: atrial fibrillation  Patient Measurements: Height: 5\' 3"  (160 cm) Weight: 186 lb (84.369 kg) IBW/kg (Calculated) : 52.4  Vital Signs: Temp: 97.5 F (36.4 C) (10/14 0748) Temp Source: Oral (10/14 0748) BP: 158/74 mmHg (10/14 0748) Pulse Rate: 72 (10/14 0748)  Labs:  Recent Labs  06/02/14 1355  06/03/14 0230 06/04/14 0221 06/04/14 2308 06/05/14 0126  HGB  --   --  10.8* 11.1*  --   --   HCT  --   --  34.4* 35.5*  --   --   PLT  --   --  120* 139*  --   --   LABPROT  --   --  30.4* 29.5*  --  28.4*  INR  --   --  2.91* 2.80*  --  2.67*  CREATININE  --   < > 2.25* 2.11* 2.12* 1.98*  TROPONINI <0.30  --   --   --   --   --   < > = values in this interval not displayed.  Estimated Creatinine Clearance: 23.3 ml/min (by C-G formula based on Cr of 1.98).   Assessment: 80yo female c/o intermittent generalized weakness x3d, admitted for COPD exacerbation, to continue Coumadin for Afib; admission INR within goal range without bleeding complications.   INR = 2.67  Levaquin stopped due to prolonged QTc  Goal of Therapy:  INR 2-3   Plan:  Follow up AM INR Coumadin 2.5 mg po daily (back on home dose)  Thank you. Okey RegalLisa Liahm Grivas, PharmD [phone]  06/05/2014 9:57 AM

## 2014-06-05 NOTE — Plan of Care (Addendum)
NP paged by Paulo FruitMatt Whitlock of cardiology concerning pt's arrythmia tonight. See his note. Levaquin d/c'd and Zithromax restarted in light of findings. Update: Dr. Adolm JosephWhitlock called back stating Zithromax also contraindicated. Pt has many allergies and now with contraindications secondary to arrythmia. Will report to oncoming attending to address antibiotic issue. Next dose not due til 1000 hrs. Also, cardio d/c'd Paxil for same reason.  [facility] LLCKJKG, NP [facility]

## 2014-06-06 DIAGNOSIS — I472 Ventricular tachycardia: Secondary | ICD-10-CM

## 2014-06-06 DIAGNOSIS — J189 Pneumonia, unspecified organism: Secondary | ICD-10-CM

## 2014-06-06 DIAGNOSIS — E1165 Type 2 diabetes mellitus with hyperglycemia: Secondary | ICD-10-CM

## 2014-06-06 DIAGNOSIS — I480 Paroxysmal atrial fibrillation: Secondary | ICD-10-CM

## 2014-06-06 LAB — COMPREHENSIVE METABOLIC PANEL
ALK PHOS: 75 U/L (ref 39–117)
ALT: 16 U/L (ref 0–35)
AST: 30 U/L (ref 0–37)
Albumin: 3 g/dL — ABNORMAL LOW (ref 3.5–5.2)
Anion gap: 14 (ref 5–15)
BUN: 57 mg/dL — ABNORMAL HIGH (ref 6–23)
CO2: 28 meq/L (ref 19–32)
Calcium: 8.9 mg/dL (ref 8.4–10.5)
Chloride: 99 mEq/L (ref 96–112)
Creatinine, Ser: 1.9 mg/dL — ABNORMAL HIGH (ref 0.50–1.10)
GFR calc Af Amer: 28 mL/min — ABNORMAL LOW (ref >90)
GFR, EST NON AFRICAN AMERICAN: 24 mL/min — AB (ref >90)
GLUCOSE: 204 mg/dL — AB (ref 70–99)
Potassium: 4.2 mEq/L (ref 3.7–5.3)
SODIUM: 141 meq/L (ref 137–147)
Total Bilirubin: 0.4 mg/dL (ref 0.3–1.2)
Total Protein: 5.9 g/dL — ABNORMAL LOW (ref 6.0–8.3)

## 2014-06-06 LAB — GLUCOSE, CAPILLARY
GLUCOSE-CAPILLARY: 311 mg/dL — AB (ref 70–99)
GLUCOSE-CAPILLARY: 226 mg/dL — AB (ref 70–99)
Glucose-Capillary: 204 mg/dL — ABNORMAL HIGH (ref 70–99)
Glucose-Capillary: 156 mg/dL — ABNORMAL HIGH (ref 70–99)

## 2014-06-06 LAB — PROTIME-INR
INR: 2.56 — ABNORMAL HIGH (ref 0.00–1.49)
Prothrombin Time: 27.7 seconds — ABNORMAL HIGH (ref 11.6–15.2)

## 2014-06-06 LAB — HEMOGLOBIN A1C
Hgb A1c MFr Bld: 7.8 % — ABNORMAL HIGH (ref <5.7)
MEAN PLASMA GLUCOSE: 177 mg/dL — AB (ref <117)

## 2014-06-06 MED ORDER — INSULIN GLARGINE 100 UNIT/ML ~~LOC~~ SOLN
5.0000 [IU] | Freq: Every day | SUBCUTANEOUS | Status: DC
  Administered 2014-06-06 – 2014-06-07 (×2): 5 [IU] via SUBCUTANEOUS
  Filled 2014-06-06 (×2): qty 0.05

## 2014-06-06 NOTE — Progress Notes (Signed)
ANTICOAGULATION CONSULT NOTE   Pharmacy Consult for Coumadin Indication: atrial fibrillation  Patient Measurements: Height: 5\' 3"  (160 cm) Weight: 187 lb 6.3 oz (85 kg) IBW/kg (Calculated) : 52.4  Vital Signs: Temp: 98 F (36.7 C) (10/15 0512) Temp Source: Oral (10/15 0512) BP: 132/75 mmHg (10/15 0512) Pulse Rate: 73 (10/15 0512)  Labs:  Recent Labs  06/04/14 0221 06/04/14 2308 06/05/14 0126 06/06/14 0430  HGB 11.1*  --   --   --   HCT 35.5*  --   --   --   PLT 139*  --   --   --   LABPROT 29.5*  --  28.4* 27.7*  INR 2.80*  --  2.67* 2.56*  CREATININE 2.11* 2.12* 1.98* 1.90*    Estimated Creatinine Clearance: 24.4 ml/min (by C-G formula based on Cr of 1.9).   Assessment: 80yo female c/o intermittent generalized weakness x3d, admitted for COPD exacerbation, to continue Coumadin for Afib; admission INR 2.56  within goal range without bleeding complications. Last CBC 10/13 stable.   Levaquin, Azithromycin, paxil  stopped due to prolonged QTc  Goal of Therapy:  INR 2-3   Plan:  Follow up AM INR Coumadin 2.5 mg po daily (back on home dose)  Leota SauersLisa Pierce Biagini Pharm.D. CPP, BCPS Clinical Pharmacist [phone] 06/06/2014 11:37 AM

## 2014-06-06 NOTE — Progress Notes (Signed)
Patient Profile: Patient is a 80 y.o. female with a PMHx of chronic CHF, (Bi-V pacer) , atrial fib, DM, HTN, COPD and CKD admitted for acute on chronic respiratory failure secondary to COPD exacerbation and CHF. EF 45% by echo. BNP elevated at 13988. Placed on antibiotics for COPD (zithromax and Levaquin) ---> 06/04/13  Developed 20 secs of polymorphic VT that spontaneously resolved. Qtc ~500 mg. K and Mg were normal. Antibiotics as well as PRN Zofran discontinued. No further recurrence.    Subjective: Feels better. Denies chest pain and palpations. Breathing improved. Eager to go home.   Objective: Vital signs in last 24 hours: Temp:  [97.7 F (36.5 C)-98.3 F (36.8 C)] 98 F (36.7 C) (10/15 0512) Pulse Rate:  [70-73] 73 (10/15 0512) Resp:  [16-21] 16 (10/15 0512) BP: (127-153)/(50-75) 132/75 mmHg (10/15 0512) SpO2:  [91 %-96 %] 94 % (10/15 0805) Weight:  [187 lb 6.3 oz (85 kg)] 187 lb 6.3 oz (85 kg) (10/15 0512) Last BM Date: 06/05/14  Intake/Output from previous day: 10/14 0701 - 10/15 0700 In: 1080 [P.O.:1080] Out: 550 [Urine:550] Intake/Output this shift: Total I/O In: 240 [P.O.:240] Out: 300 [Urine:300]  Medications Current Facility-Administered Medications  Medication Dose Route Frequency Provider Last Rate Last Dose  . acetaminophen (TYLENOL) tablet 650 mg  650 mg Oral Q6H PRN Therisa DoyneAnastassia Doutova, MD       Or  . acetaminophen (TYLENOL) suppository 650 mg  650 mg Rectal Q6H PRN Therisa DoyneAnastassia Doutova, MD      . atorvastatin (LIPITOR) tablet 40 mg  40 mg Oral q1800 Therisa DoyneAnastassia Doutova, MD   40 mg at 06/05/14 1729  . budesonide (PULMICORT) nebulizer solution 0.25 mg  0.25 mg Nebulization Q6H Maryann Mikhail, DO   0.25 mg at 06/06/14 0805  . furosemide (LASIX) tablet 40 mg  40 mg Oral Daily Cassell Clementhomas Brackbill, MD   40 mg at 06/06/14 1102  . guaiFENesin (MUCINEX) 12 hr tablet 600 mg  600 mg Oral BID Therisa DoyneAnastassia Doutova, MD   600 mg at 06/06/14 1101  . HYDROcodone-acetaminophen  (NORCO/VICODIN) 5-325 MG per tablet 1-2 tablet  1-2 tablet Oral Q4H PRN Therisa DoyneAnastassia Doutova, MD      . insulin aspart (novoLOG) injection 0-15 Units  0-15 Units Subcutaneous TID WC Therisa DoyneAnastassia Doutova, MD   5 Units at 06/06/14 0643  . insulin aspart (novoLOG) injection 0-5 Units  0-5 Units Subcutaneous QHS Therisa DoyneAnastassia Doutova, MD   4 Units at 06/05/14 2109  . insulin glargine (LANTUS) injection 5 Units  5 Units Subcutaneous Daily Russella DarAllison L Ellis, NP      . ipratropium-albuterol (DUONEB) 0.5-2.5 (3) MG/3ML nebulizer solution 3 mL  3 mL Nebulization Q6H Maryann Mikhail, DO   3 mL at 06/06/14 0805  . linagliptin (TRADJENTA) tablet 5 mg  5 mg Oral Daily Russella DarAllison L Ellis, NP   5 mg at 06/06/14 1101  . montelukast (SINGULAIR) tablet 10 mg  10 mg Oral QHS Therisa DoyneAnastassia Doutova, MD   10 mg at 06/05/14 2109  . predniSONE (DELTASONE) tablet 40 mg  40 mg Oral Q breakfast Russella DarAllison L Ellis, NP   40 mg at 06/06/14 [id number]  . sodium chloride 0.9 % injection 3 mL  3 mL Intravenous Q12H Therisa DoyneAnastassia Doutova, MD   3 mL at 06/06/14 1000  . warfarin (COUMADIN) tablet 2.5 mg  2.5 mg Oral q1800 Lonia BloodJeffrey T McClung, MD   2.5 mg at 06/05/14 1729  . Warfarin - Pharmacist Dosing Inpatient   Does not apply [id number] Veronda Pauline Bryk, [facility] HospitalRPH  PE: General appearance: alert, cooperative and no distress Lungs: faint, expiratory rhonchi bilaterally Heart: irregularly irregular rhythm Extremities: no LEE Pulses: 2+ and symmetric Skin: warm and dry Neurologic: Grossly normal  Lab Results:   Recent Labs  06/04/14 0221  WBC 12.3*  HGB 11.1*  HCT 35.5*  PLT 139*   BMET  Recent Labs  06/04/14 2308 06/05/14 0126 06/06/14 0430  NA 142 139 141  K 3.7 4.4 4.2  CL 98 98 99  CO2 29 27 28   GLUCOSE 278* 226* 204*  BUN 56* 55* 57*  CREATININE 2.12* 1.98* 1.90*  CALCIUM 9.2 9.0 8.9   PT/INR  Recent Labs  06/04/14 0221 06/05/14 0126 06/06/14 0430  LABPROT 29.5* 28.4* 27.7*  INR 2.80* 2.67* 2.56*     Assessment/Plan     Active Problems:   Hypertension   Atrial fibrillation   Chronic diastolic heart failure   Respiratory distress   COPD exacerbation   DM2 (diabetes mellitus, type 2)   Acute on chronic renal failure   Acute on chronic respiratory failure with hypoxemia   Elevated troponin   DM type 2 causing CKD stage 2  1. Chronic Systolic HF: breathing improved. No rales on physical exam. No peripheral edema. Continue 40 mg PO Lasix. Stress low sodium diet on discharge.   2. Atrial Fibrillation/flutter: rate is controlled. Currently not on any AV nodal blocking agents. BB use has been avoided given COPD. May consider PRN Xopenex vs albuterol to reduce risk for reflex tachycardia. Continue Warfarin for AC. INR is therapeutic.   3. Polymorphic VT: no further recurrence. Likely medication induced. All QT prolonging drugs have been discontinued. K is WNL at 4.2.     LOS: 5 days    Brittainy M. Sharol HarnessSimmons, PA-C 06/06/2014 11:25 AM  Personally seen and examined. Agree with above. Improved from cardiac perspective.  Avoid QT prolonging agents.

## 2014-06-06 NOTE — Progress Notes (Signed)
Occupational Therapy Treatment Patient Details Name: [name] MRN: [id number] DOB: [birth year] Today's Date: 06/06/2014    History of present illness Pt is a 80 y.o. female presenting with 1 week history of worsening shortness of breath, fever up to 101 that got better with tylenol. Patient improved at first. Next few days she developed diarrhea that lasted about 24 hours. Patient had Hx of COPD and asthma. Usually patient is on 5 L of oxygen at home but yesterday her family increased it to 6L. Patient presented with worsening shortness of breath requiring continuous nebulizer while in ER. Pt admitted for COPD exacerbation. Pt with episode of VT 10/13.   OT comments  Pt reporting disappointment that she cannot return home until she is competent with administering insulin.  Focus of session on toileting, standing grooming and energy conservation strategy education. Pt continues to be somewhat impulsive, likely her baseline.  Follow Up Recommendations   (pt continues to decline HHOT)    Equipment Recommendations  None recommended by OT    Recommendations for Other Services      Precautions / Restrictions Precautions Precautions: Fall Restrictions Weight Bearing Restrictions: No       Mobility Bed Mobility                  Transfers Overall transfer level: Needs assistance Equipment used: Rolling walker (2 wheeled)   Sit to Stand: Supervision         General transfer comment: supervision    Balance                                   ADL Overall ADL's : Needs assistance/impaired     Grooming: Wash/dry hands;Brushing hair;Standing;Supervision/safety           Upper Body Dressing : Set up;Sitting       Toilet Transfer: Supervision/safety;Stand-pivot;BSC (pt with urinary urgency, could not make it to the bathroom)   Toileting- Clothing Manipulation and Hygiene: Sit to/from stand;Supervision/safety         General ADL Comments:  Instructed in use of incentive spirometer. Instructed in pacing and breathing techniques with mobility and ADL. Pt reports coughing up discolored phlegm.  Saved napkin for RN to view.      Vision                     Perception     Praxis      Cognition   Behavior During Therapy: Impulsive Overall Cognitive Status: Within Functional Limits for tasks assessed                       Extremity/Trunk Assessment               Exercises     Shoulder Instructions       General Comments      Pertinent Vitals/ Pain       Pain Assessment: No/denies pain  Home Living                                          Prior Functioning/Environment              Frequency Min 2X/week     Progress Toward Goals  OT Goals(current goals can now be found in the care plan section)  Progress towards OT goals: Progressing toward goals  Acute Rehab OT Goals Patient Stated Goal: return home   Plan Discharge plan remains appropriate    Co-evaluation                 End of Session Equipment Utilized During Treatment: Rolling walker   Activity Tolerance Patient tolerated treatment well   Patient Left in chair;with call bell/phone within reach   Nurse Communication Other (comment) (pt with BM, discolored phlegm)        Time: 1352- 1418    Charges:    Evern BioMayberry, Anees Vanecek Lynn 06/06/2014, 2:16 PM [phone]

## 2014-06-06 NOTE — Progress Notes (Signed)
Inpatient Diabetes Program Recommendations  AACE/ADA: New Consensus Statement on Inpatient Glycemic Control (2013)  Target Ranges:  Prepandial:   less than 140 mg/dL      Peak postprandial:   less than 180 mg/dL (1-2 hours)      Critically ill patients:  140 - 180 mg/dL   Results for [name], [name] (MRN [id number]) as of 06/06/2014 08:30  Ref. Range 06/05/2014 07:51 06/05/2014 11:29 06/05/2014 16:45 06/05/2014 20:40 06/06/2014 06:08  Glucose-Capillary Latest Range: 70-99 mg/dL 409265 (H) 811326 (H) 914142 (H) 313 (H) 204 (H)    Reason for assessment: elevated CBG. Bedtime CBG 313mg /dl, fasting CBG 204mg /dl  Diabetes history: Type 2 Outpatient Diabetes medications: none Current orders for Inpatient glycemic control: Novolog 0-5units q hs, 0-15 units pre meals and Tragenta 5mg  daily started on 06/05/14  May want to consider starting a low dose of basal insulin now while patient is in the hospital.  Susette RacerJulie Goebel Hellums, RN, BA, MHA, CDE Diabetes Coordinator Inpatient Diabetes Program  [phone] (Team Pager) [phone] Patrcia Dolly([place] Office) 06/06/2014 8:35 AM

## 2014-06-06 NOTE — Progress Notes (Addendum)
[name] - Stepdown / ICU Progress Note  [name] MarvelGertrude S [name] MWU:[id number] DOB: March 26, 1932 DOA: 06/01/2014 PCP: [name]  Brief narrative: 80 year old WF PMHx CHF, atrial fibrillation, diabetes mellitus, hypertension and COPD on 5 L O2 at home. She presented to the emergency department with progressive shortness of breath associated with fever x1 week. 24hrs prior to presentation she developed diarrhea but this had resolved by the time she presented to the emergency department. She is on chronic prednisone 10 mg daily as well as 5 L nasal cannula oxygen. She had increased her oxygen up to 6 L without any improvement in her symptoms.  Upon presentation to the emergency department the patient was hypoxic and actively wheezing. She does have chronic kidney disease but her creatinine was much higher than baseline at 2.7. She also had a mildly elevated troponin.  Since admission patient has been treated for both CHF as well as COPD exacerbation. She was initially started on Zithromax and ceftriaxone but was switched to Levaquin. She was also given high dose IV steroids and continued on nebulizer therapies. Chest x-ray revealed no definitive infiltrates. She was getting up with assistance and PT recommended home health physical therapy. Plans were to tentatively discharged the patient on 10/14.  Overnight into10/14 patient developed a brief run of multifocal/polymorphic ventricular tachycardia. It was felt that this was related to the patient's Levaquin i.e. prolonged QTc therefore this medication was discontinued. Patient continued with frequent PVCs on 10/14 so was felt she needed to remain hospitalized for at least an additional 24 hours to follow for recurrence of polymorphic V. tach. She also has had hemoglobin A1c persistently greater than 7 - upon questioning she endorsed having been inconsistent in taking oral diabetes medication. She apparently has glyburide at home which  she takes inconsistently but also has a documented sulfonylurea allergy so on 10/14 it was opted to begin [place].   Current CBGs have increased to between 250 and 300 as of 10/15 but suspect this is related to higher dose prednisone. For the short-term have begun low-dose Lantus. Patient reluctant to continue Lantus at discharge. After today has only 2 more days at a higher dose prednisone so we'll keep in patient and monitor CBGs on usual 10 mg dosage of prednisone daily. Hopefully Tradgenta alone will be effective.  HPI/Subjective: Patient alert and eager to discharge home. Attending physician explained need to remain inpatient because of poorly controlled diabetes which may be transient. Patient without any other complaints. Very reluctant to utilize insulin for diabetes control.  Assessment/Plan:  Polymorphic VT with prolonged QTC Appreciate cardiology assistance - suspect Levaquin as etiology - has not recurred - potassium normal  Hypertension Moderate control-continue Lasix  Atrial fibrillation Currently rate controlled but is having frequent PVCs-potassium greater than 4 and Mg 2.6-prefer not to use beta blocker such as Lopressor given COPD but if needed can certainly use carvedilol  Acute on chronic respiratory failure with hypoxemia:   A) COPD exacerbation   B) Possible community-acquired pneumonia   C) ? Acute Takotsubo syndrome   D) Chronic diastolic heart failure No definitive infiltrate initially - will repeat chest x-ray showed improved discoid infiltrates and no indications to resume antibiotics at this time- continue nebs - transitioned Solu-Medrol to prednisone 40 mg by mouth daily for a total of 4 days with last dose expected 10/17 then resume usual 10 mg daily dose - in regards to heart failure echo shows hypokinesis of the entire LV and RV apex  so consideration of Takotsubo syndrome; if so will need followup echo in a few months  DM2 (diabetes mellitus, type 2) Poorly  controlled and likely exacerbated by higher dose of chronic steroids - was not consistently taking oral hypoglycemic agents at home and given GFR oral agents are limited - Tradgenta initiated 10/14 -on 10/15 CBGs poorly controlled so at a low-dose Lantus 5 units; suspect once the higher dose of prednisone is completed that sugars will be able to be managed with Tradgenta alone therefore recommend she stay inpatient until able to transition  Acute on chronic renal failure, stage IV Creatinine not at baseline of 21/[name].75 - BUN elevated related to higher steroid dose - does not appear to be volume depleted but will monitor closely   Elevated troponin only mildly elevated at 0.75 and has recently trended down to normal - suspect related to chronic kidney disease as well as mild demand ischemia - echo consistent with likely takotsubo syndrome      DVT prophylaxis: warfarin Code Status:  DO NOT RESUSCITATE Family Communication:  No family at bedside Disposition Plan/Expected LOS: Telemetry but keep on team [name] after CBGs better controlled and on usual home dose prednisone 10 mg daily  Consultants:  [name] Cardiology  Procedures:  2-D echocardiogram: - Left ventricle: The cavity size was normal. Wall thickness was normal. The estimated ejection fraction was 45%. There is hypokinesis of the apical myocardium. - Right ventricle: Systolic function was mildly reduced. Hypokinesis of the RV apex. - Impressions: There is hypokinesis oif the entire LV and RV apex. Consider possible Tako-Tsubo cardiomyopathy.  Cultures:  None  Antibiotics: Rocephin 10/10>>10/13 Zithromax 10/10>>10/12 Levaquin 10/12>>10/14  Objective: Blood pressure 132/75, pulse 73, temperature 98 F (36.7 C), temperature source Oral, resp. rate 16, height 5\' 3"  ([name].6 m), weight 187 lb 6.3 oz (85 kg), SpO2 94.00%.  Intake/Output Summary (Last 24 hours) at 06/06/14 1332 Last data filed at 06/06/14 0800  Gross per 24  hour  Intake    820 ml  Output    850 ml  Net    -30 ml   Exam: Gen: No acute respiratory distress Chest: Coarse to auscultation with scattered expiratory wheezes, 6 L nasal cannula ox  Cardiac: Irregular rate and rhythm with frequent unit PVCs, S1-S2, no rubs murmurs or gallops, trace lower extremity peripheral edema, no JVD Abdomen: Soft nontender nondistended without obvious hepatosplenomegaly, no ascites Extremities: Symmetrical in appearance without cyanosis, clubbing or effusion  Scheduled Meds:  Scheduled Meds: . atorvastatin  40 mg Oral q1800  . budesonide  0.25 mg Nebulization Q6H  . furosemide  40 mg Oral Daily  . guaiFENesin  600 mg Oral BID  . insulin aspart  0-15 Units Subcutaneous TID WC  . insulin aspart  0-5 Units Subcutaneous QHS  . insulin glargine  5 Units Subcutaneous Daily  . ipratropium-albuterol  3 mL Nebulization Q6H  . linagliptin  5 mg Oral Daily  . montelukast  10 mg Oral QHS  . predniSONE  40 mg Oral Q breakfast  . sodium chloride  3 mL Intravenous Q12H  . warfarin  2.5 mg Oral q1800  . Warfarin - Pharmacist Dosing Inpatient   Does not apply q1800   Data Reviewed: Basic Metabolic Panel:  Recent Labs Lab 06/02/14 06/03/14 0230 06/04/14 0221 06/04/14 2308 06/05/14 0126 06/06/14 0430  NA 137 137 141 142 139 141  K 4.2 4.3 4.7 3.7 4.4 4.2  CL 91* 96 97 98 98 99  CO2 20 28  29 29 27 28   GLUCOSE 400* 263* 216* 278* 226* 204*  BUN 32* 46* 51* 56* 55* 57*  CREATININE 2.66* 2.25* 2.11* 2.12* [name].98* [name].90*  CALCIUM 8.6 9.[name] 9.2 9.2 9.0 8.9  MG 2.3  --   --   --  2.6*  --   PHOS 5.[name]*  --   --   --   --   --    Liver Function Tests:  Recent Labs Lab 06/01/14 1649 06/02/14 06/04/14 2308 06/06/14 0430  AST 32 31 22 30   ALT 11 10 11 16   ALKPHOS 67 64 91 75  BILITOT 0.7 0.3 0.2* 0.4  PROT 6.7 6.6 6.2 5.9*  ALBUMIN 3.4* 3.2* 3.0* 3.0*   CBC:  Recent Labs Lab 06/01/14 1649 06/01/14 1656 06/02/14 06/03/14 0230 06/04/14 0221  WBC 8.3  --   7.5 14.6* 12.3*  NEUTROABS 6.7  --   --   --   --   HGB 12.0 13.3 11.8* 10.8* 11.[name]*  HCT 39.5 39.0 37.8 34.4* 35.5*  MCV 98.8  --  102.2* 96.9 96.2  PLT 103*  --  150 120* 139*   Cardiac Enzymes:  Recent Labs Lab 06/01/14 2016 06/02/14 06/02/14 0600 06/02/14 1355  TROPONINI 0.75* 0.48* <0.30 <0.30   BNP (last 3 results)  Recent Labs  12/04/13 2258 06/02/14  PROBNP 17479.0* 13998.0*   CBG:  Recent Labs Lab 06/05/14 0751 06/05/14 1129 06/05/14 1645 06/05/14 2040 06/06/14 0608  GLUCAP 265* 326* 142* 313* 204*    Recent Results (from the past 240 hour(s))  MRSA PCR SCREENING     Status: None   Collection Time    06/02/14  6:59 AM      Result Value Ref Range Status   MRSA by PCR NEGATIVE  NEGATIVE Final   Comment:            The GeneXpert MRSA Assay (FDA     approved for NASAL specimens     only), is one component of a     comprehensive MRSA colonization     surveillance program. It is not     intended to diagnose MRSA     infection nor to guide or     monitor treatment for     MRSA infections.     Studies:  Recent x-ray studies have been reviewed in detail by the Attending Physician  Time spent :  35 mins  Junious Silkllison Ellis, ANP [facility] Office  [phone] Pager [phone]  On-Call/Text Page:      [email or web address]      password [email or web address]  If 7PM-7AM, please contact night-coverage [email or web address] Password [email or web address] 06/06/2014, [name]:32 PM   LOS: 5 days   Examining patient and discussed assessment and plan with ANP Revonda StandardAllison and agree with the above plan.  Patient with multiple complex medical issues> 40 minutes spent in direct patient care

## 2014-06-07 LAB — BASIC METABOLIC PANEL
Anion gap: 13 (ref 5–15)
BUN: 55 mg/dL — AB (ref 6–23)
CO2: 29 meq/L (ref 19–32)
Calcium: 8.6 mg/dL (ref 8.4–10.5)
Chloride: 99 mEq/L (ref 96–112)
Creatinine, Ser: 1.93 mg/dL — ABNORMAL HIGH (ref 0.50–1.10)
GFR calc Af Amer: 27 mL/min — ABNORMAL LOW (ref >90)
GFR calc non Af Amer: 23 mL/min — ABNORMAL LOW (ref >90)
GLUCOSE: 165 mg/dL — AB (ref 70–99)
Potassium: 3.7 mEq/L (ref 3.7–5.3)
Sodium: 141 mEq/L (ref 137–147)

## 2014-06-07 LAB — PROTIME-INR
INR: 2.61 — ABNORMAL HIGH (ref 0.00–1.49)
Prothrombin Time: 28.2 seconds — ABNORMAL HIGH (ref 11.6–15.2)

## 2014-06-07 LAB — GLUCOSE, CAPILLARY
GLUCOSE-CAPILLARY: 156 mg/dL — AB (ref 70–99)
Glucose-Capillary: 152 mg/dL — ABNORMAL HIGH (ref 70–99)
Glucose-Capillary: 229 mg/dL — ABNORMAL HIGH (ref 70–99)

## 2014-06-07 MED ORDER — LINAGLIPTIN 5 MG PO TABS: 5.0000 mg | ORAL_TABLET | Freq: Every day | ORAL | Status: AC

## 2014-06-07 MED ORDER — PREDNISONE 10 MG PO TABS: 10.0000 mg | ORAL_TABLET | Freq: Every day | ORAL | Status: DC

## 2014-06-07 MED ORDER — PREDNISONE 10 MG PO TABS
10.0000 mg | ORAL_TABLET | Freq: Every day | ORAL | Status: DC
  Administered 2014-06-07: 10 mg via ORAL
  Filled 2014-06-07 (×2): qty 1

## 2014-06-07 NOTE — Progress Notes (Signed)
ANTICOAGULATION CONSULT NOTE - Follow Up Consult  Pharmacy Consult for Coumadin Indication: atrial fibrillation  Allergies  Allergen Reactions  . Advair Diskus [Fluticasone-Salmeterol]     Itching  . Biaxin [Clarithromycin]     N/V  . Ciprofloxacin     N/V  . Doxycycline Other (See Comments)    Inside of mouth and tongue red and peeled  . Keflex [Cephalexin]     N/V  . Ketek [Telithromycin]     N/V  . Levaquin [Levofloxacin In D5w] Other (See Comments)    Arrythmia, polymorphic Vtach on 10/15 hospitalization  . Macrobid [Nitrofurantoin]     N/V  . Penicillins     Rash  . Sulfonamide Derivatives     Patient Measurements: Height: 5\' 3"  (160 cm) Weight: 187 lb 6.3 oz (85 kg) IBW/kg (Calculated) : 52.4 Heparin Dosing Weight:   Vital Signs: Temp: 98.2 F (36.8 C) (10/16 0540) Temp Source: Oral (10/16 0540) BP: 134/69 mmHg (10/16 0540) Pulse Rate: 70 (10/16 0540)  Labs:  Recent Labs  06/05/14 0126 06/06/14 0430 06/07/14 0405  LABPROT 28.4* 27.7* 28.2*  INR 2.67* 2.56* 2.61*  CREATININE 1.98* 1.90* 1.93*    Estimated Creatinine Clearance: 24 ml/min (by C-G formula based on Cr of 1.93).   Medications:  Scheduled:  . atorvastatin  40 mg Oral q1800  . budesonide  0.25 mg Nebulization Q6H  . furosemide  40 mg Oral Daily  . guaiFENesin  600 mg Oral BID  . insulin aspart  0-15 Units Subcutaneous TID WC  . insulin aspart  0-5 Units Subcutaneous QHS  . insulin glargine  5 Units Subcutaneous Daily  . ipratropium-albuterol  3 mL Nebulization Q6H  . linagliptin  5 mg Oral Daily  . montelukast  10 mg Oral QHS  . predniSONE  40 mg Oral Q breakfast  . sodium chloride  3 mL Intravenous Q12H  . warfarin  2.5 mg Oral q1800  . Warfarin - Pharmacist Dosing Inpatient   Does not apply q1800    Assessment: Pt is back on home dose of coumadin 2.5 mg daily. INR 2.61 Goal of Therapy:  INR 2-3    Plan:  Coumadin 2.5 mg daily (home dose) Daily INR  Eugene Garnetotter, Cadon Raczka  Sue 06/07/2014,9:39 AM

## 2014-06-07 NOTE — Progress Notes (Signed)
CARE MANAGEMENT NOTE 06/07/2014  Patient:  [name], [name]   Account Number:  [id number]  Date Initiated:  06/04/2014  Documentation initiated by:  Elissa Hefty  Subjective/Objective Assessment:   adm w resp distress     Action/Plan:   lives w fam, pcp dr Unk Pinto, chart states has home o2.   Anticipated DC Date:  06/07/2014   Anticipated DC Plan:  [place]  CM consult      [facility] Choice  HOME HEALTH  Resumption Of Svcs/PTA Provider   Choice offered to / List presented to:  C-1 Patient        [place] arranged  HH-1 RN  [place]      [place].   Status of service:  Completed, signed off Medicare Important Message given?  YES (If response is "NO", the following Medicare IM given date fields will be blank) Date Medicare IM given:  06/04/2014 Medicare IM given by:  Elissa Hefty Date Additional Medicare IM given:  06/07/2014 Additional Medicare IM given by:  Jonnie Finner  Discharge Disposition:  [place]  Per UR Regulation:  Reviewed for med. necessity/level of care/duration of stay  If discussed at [place] of Stay Meetings, dates discussed:   06/06/2014    Comments:  06/07/2014 1500 NCM notified [facility] of scheduled dc home today with HH. NCM contacted [place] for a portable tank to dc home from hospital. Spoke to rep and they will have [employer] rep deliver to hospital. Pt states she has portable oxygen at home with high concentration but does not know how to use. Will make [employer] aware. Has RW and 3n1 at home. Pt not scheduled to dc home on Lantus or Levemir. Jonnie Finner RN CCM Case Mgmt phone [phone]  Lantus vs Levemir PT HAS NOT MET THE REST OF THE DEDCUCTIBLE  OF $77.70. ONCE THAT IS COVERED PT WILL HAVE A $45 COPAY FOR BOTH MEDICATIONS  10/14 1415 debbie dowell rn,bsn spoke w pt. she has fam that live w her. hx of hhc and felt it was ahc and would like to use them  again for hhc. ref to donna w [facility] for hhrn and hhpt.

## 2014-06-07 NOTE — Discharge Summary (Signed)
Physician Discharge Summary  [name]:[id number] DOB: 1931/09/28 DOA: 06/01/2014  PCP: Nadean Corwin, MD  Admit date: 06/01/2014 Discharge date: 06/07/2014  Time spent:> 30 minutes  Recommendations for Outpatient Follow-up:  1. Follow up with PCP- she has an appointment with the [facility] scheduled on 10/28 at 245 pm 2. Follow up with Cardiology/[place] on 11/3 at 1 pm 3. Home Health PT ordered - resume prior home oxygen at 5L 4. New start Tradjenta this admit- pre admit Glyburide stopped due to CKD and documented sulfa allergy 5. Echocardiogram this admission with findings concerning for acute Takotsubo syndrome so followup echocardiogram recommended in the outpatient setting  Discharge Diagnoses:    Hypertension-controlled   Atrial fibrillation   Prolonged QT - polymorphic VT due to levaquin v/s azithromycin   Chronic diastolic heart failure   Acute on Chronic hypoxic respiratory failure due to acute discoid PNA   COPD exacerbation/on chronic steroid-resolved   Diabetes mellitus, type 2-started on Tradjenta   Demand ischemia was suspected Acute Takotsubo syndrome   CKD stage 3 with azotemia related to higher dose prednisone   Discharge Condition: stable  Diet recommendation: Heart healthy carbohydrate modified  Filed Weights   06/01/14 1638 06/02/14 0650 06/06/14 0512  Weight: 83.008 kg (183 lb) 84.369 kg (186 lb) 85 kg (187 lb 6.3 oz)    History of present illness:  80 year old WF PMHx CHF, atrial fibrillation, diabetes mellitus, hypertension and COPD on 5 L O2 at home. She presented to the emergency department with progressive shortness of breath associated with fever x1 week. 24hrs prior to presentation she developed diarrhea but this had resolved by the time she presented to the emergency department. She is on chronic prednisone 10 mg daily as well as 5 L nasal cannula oxygen. She had increased her oxygen up to 6 L without any  improvement in her symptoms.   Upon presentation to the emergency department the patient was hypoxic and actively wheezing. She does have chronic kidney disease but her creatinine was much higher than baseline at 2.7. She also had a mildly elevated troponin.   Since admission patient has been treated for both CHF as well as COPD exacerbation. She was initially started on Zithromax and ceftriaxone but was switched to Levaquin. She was also given high dose IV steroids and continued on nebulizer therapies. Chest x-ray revealed no definitive infiltrates. She was getting up with assistance and PT recommended home health physical therapy. Plans were to tentatively discharged the patient on 10/14.   Overnight into10/14 patient developed a brief run of multifocal/polymorphic ventricular tachycardia. It was felt that this was related to the patient's Levaquin i.e. prolonged QTc therefore this medication was discontinued. Patient continued with frequent PVCs on 10/14 so was felt she needed to remain hospitalized for at least an additional 24 hours to follow for recurrence of polymorphic V. tach. She also has had hemoglobin A1c persistently greater than 7 - upon questioning she endorsed having been inconsistent in taking oral diabetes medication. She apparently has glyburide at home which she takes inconsistently but also has a documented sulfonylurea allergy so on 10/14 it was opted to begin [place].   Current CBGs had increased to between 250 and 300 as of 10/15 but suspected this was related to higher dose prednisone. For the short-term low-dose Lantus was utilize. Patient was reluctant to continue Lantus at discharge. On date of discharge prednisone was returned to her usual 10 mg dosage. She will discharged on Tradjenta.  Hospital  Course:   Polymorphic VT with prolonged QTC  Appreciate cardiology assistance - suspect Levaquin v/s azithromycin as etiology - has not recurred - potassium normal    Hypertension  Moderate control - continue Lasix after discharge  Atrial fibrillation  Currently rate controlled but is having frequent PVCs - potassium greater than 4 and Mg 2.6 - prefer not to use beta blocker given COPD   Acute on chronic respiratory failure with hypoxemia:  A) COPD exacerbation  B) Possible community-acquired pneumonia  C) ? Acute Takotsubo syndrome  D) Chronic diastolic heart failure  No definitive infiltrate initially and repeat chest x-ray showed improved discoid infiltrates and no indication to resume antibiotics. Supportive care with nebulizer treatments was initiated. She was later transitioned from Solu-Medrol to prednisone 40 mg by mouth daily for a total of 3 days. On 10/16 we resumed her usual 10 mg daily dose. In regards to her heart heart failure, the echo showed hypokinesis of the entire LV and RV apex so consideration of Takotsubo syndrome. She will need followup echo in a few months   DM2 (diabetes mellitus, type 2)  Poorly controlled this hospitalization and likely exacerbated by higher dose of chronic steroid. Patient endorsed she was not consistently taking oral hypoglycemic agent at home and given her low GFR choice of oral agents limited.Derrell Lolling was initiated 10/14. On 10/15 CBGs were poorly controlled so low-dose Lantus insulin was initiated. It was surmised that once the higher dose of prednisone was completed that sugars would be better controlled with Tradgenta alone.  Acute on chronic renal failure, stage IV  Creatinine was close to her baseline of 21/1.75  Elevated troponin  Cardiac isoenzymes were only mildly elevated at 0.75 subsequent trend down to normal. Suspect these findings related to chronic kidney disease as well as mild demand ischemia. Echo consistent with possible takotsubo syndrome   Procedures: 2-D echocardiogram: - Left ventricle: The cavity size was normal. Wall thickness was normal. The estimated ejection fraction was 45%.  There is hypokinesis of the apical myocardium. - Right ventricle: Systolic function was mildly reduced. Hypokinesis of the RV apex. - Impressions: There is hypokinesis oif the entire LV and RV apex. Consider possible Tako-Tsubo cardiomyopathy.  Consultations: Dr. Donato Schultz Cardiology  Discharge Exam: Filed Vitals:   06/07/14 1328  BP: 113/55  Pulse: 68  Temp: 98.2 F (36.8 C)  Resp: 18   Gen: No acute respiratory distress  Chest: CTA tho with no active wheeze  Cardiac: rate controlled - no appreciable gallup rub or M Abdomen: Soft nontender nondistended without obvious hepatosplenomegaly, no ascites  Extremities: Symmetrical in appearance without cyanosis, clubbing or effusion  Discharge Medication List as of 06/07/2014  4:13 PM    START taking these medications   Details  atorvastatin (LIPITOR) 40 MG tablet Take 1 tablet (40 mg total) by mouth daily at 6 PM., Starting 06/04/2014, Until Discontinued, Normal    budesonide (PULMICORT) 0.25 MG/2ML nebulizer solution Take 2 mLs (0.25 mg total) by nebulization every 6 (six) hours., Starting 06/04/2014, Until Discontinued, Normal    guaiFENesin (MUCINEX) 600 MG 12 hr tablet Take 1 tablet (600 mg total) by mouth 2 (two) times daily., Starting 06/04/2014, Until Discontinued, Normal    linagliptin (TRADJENTA) 5 MG TABS tablet Take 1 tablet (5 mg total) by mouth daily., Starting 06/07/2014, Until Discontinued, Print        CONTINUE these medications which have CHANGED   Details  predniSONE (DELTASONE) 10 MG tablet Take 1 tablet (10 mg total) by  mouth daily with breakfast., Starting 06/08/2014, Until Discontinued, No Print      CONTINUE these medications which have NOT CHANGED   Details  albuterol (VENTOLIN HFA) 108 (90 BASE) MCG/ACT inhaler Inhale 2 puffs into the lungs every 6 (six) hours as needed. For shortness of breath., Starting 02/14/2014, Until Discontinued, Phone In    Cholecalciferol (VITAMIN D) 2000 UNITS tablet Take  5,000 Units by mouth daily. , Until Discontinued, Historical Med    dexlansoprazole (DEXILANT) 60 MG capsule Take 60 mg by mouth daily., Until Discontinued, Historical Med    furosemide (LASIX) 40 MG tablet Take 40 mg by mouth daily., Until Discontinued, Historical Med    montelukast (SINGULAIR) 10 MG tablet Take 10 mg by mouth at bedtime., Until Discontinued, Historical Med    PARoxetine (PAXIL) 20 MG tablet Take 20 mg by mouth daily. , Starting 10/19/2013, Until Discontinued, Historical Med    vitamin B-12 (CYANOCOBALAMIN) 1000 MCG tablet Take 1,000 mcg by mouth daily., Until Discontinued, Historical Med    warfarin (COUMADIN) 5 MG tablet Take 5 mg by mouth daily., Until Discontinued, Historical Med    ONE TOUCH ULTRA TEST test strip USE TO CHECK BLOOD GLUCOSE ONCE A DAY AS INSTRUCTED, Normal      STOP taking these medications     aspirin 81 MG EC tablet      simvastatin (ZOCOR) 80 MG tablet        Allergies  Allergen Reactions  . Advair Diskus [Fluticasone-Salmeterol]     Itching  . Biaxin [Clarithromycin]     N/V  . Ciprofloxacin     N/V  . Doxycycline Other (See Comments)    Inside of mouth and tongue red and peeled  . Keflex [Cephalexin]     N/V  . Ketek [Telithromycin]     N/V  . Levaquin [Levofloxacin In D5w] Other (See Comments)    Arrythmia, polymorphic Vtach on 10/15 hospitalization  . Macrobid [Nitrofurantoin]     N/V  . Penicillins     Rash  . Sulfonamide Derivatives    Follow-up Information   Follow up with [facility]. [facility](Home Health Physical Therapy and RN)    Contact information:   [address] [place] [id number] [phone]      Significant Diagnostic Studies: Dg Chest [place] 1 View  06/05/2014   CLINICAL DATA:  Subsequent evaluation for pneumonia, shortness of breath, hypoxia, current history of COPD  EXAM: PORTABLE CHEST - 1 VIEW  COMPARISON:  06/03/2014  FINDINGS: Moderate cardiac enlargement. Stable  hyperinflation. Mildly prominent pulmonary arteries suggesting the possibility of pulmonary artery hypertension.  Bands of well-defined linear density in the lingula, mildly improved when compared to the prior study. No other areas of airspace abnormality. No pleural effusion. Stable cardiac pacer.  IMPRESSION: Improving discoid atelectasis in the lingula with with stable chronic abnormalities.   Electronically Signed   By: Esperanza Heiraymond  Rubner M.D.   On: 06/05/2014 10:56   Dg Chest Portable 1 View  06/03/2014   CLINICAL DATA:  Hypoxia.  EXAM: PORTABLE CHEST - 1 VIEW  COMPARISON:  06/01/2014  FINDINGS: Left cardiac pacemaker and the leads appear to be stable. Negative for a pneumothorax. Mild enlargement of the cardiac silhouette is unchanged. Increased linear densities in the left mid lung. There appears to be decreased atelectasis in the right lung. The trachea is midline.  IMPRESSION: Increased densities in the left mid lung. Findings are most compatible with increased atelectasis in this region.   Electronically Signed  By: Richarda OverlieAdam  Henn M.D.   On: 06/03/2014 12:46   Dg Chest Port 1 View  06/01/2014   CLINICAL DATA:  Shortness of breath  EXAM: PORTABLE CHEST - 1 VIEW  COMPARISON:  December 09, 2013  FINDINGS: The heart size and mediastinal contours are stable. The heart size is enlarged. Cardiac pacemaker is unchanged. There are mild atelectasis of the right upper lobe and left mid to lower lung. There is no focal pneumonia, pulmonary edema, or pleural effusion. The visualized skeletal structures are stable.  IMPRESSION: Mild atelectasis of both lungs as described. No focal pneumonia or pulmonary edema.   Electronically Signed   By: Sherian ReinWei-Chen  Lin M.D.   On: 06/01/2014 17:54    Microbiology: Recent Results (from the past 240 hour(s))  MRSA PCR SCREENING     Status: None   Collection Time    06/02/14  6:59 AM      Result Value Ref Range Status   MRSA by PCR NEGATIVE  NEGATIVE Final   Comment:             The GeneXpert MRSA Assay (FDA     approved for NASAL specimens     only), is one component of a     comprehensive MRSA colonization     surveillance program. It is not     intended to diagnose MRSA     infection nor to guide or     monitor treatment for     MRSA infections.     Labs: Basic Metabolic Panel:  Recent Labs Lab 06/02/14  06/04/14 0221 06/04/14 2308 06/05/14 0126 06/06/14 0430 06/07/14 0405  NA 137  < > 141 142 139 141 141  K 4.2  < > 4.7 3.7 4.4 4.2 3.7  CL 91*  < > 97 98 98 99 99  CO2 20  < > 29 29 27 28 29   GLUCOSE 400*  < > 216* 278* 226* 204* 165*  BUN 32*  < > 51* 56* 55* 57* 55*  CREATININE 2.66*  < > 2.11* 2.12* 1.98* 1.90* 1.93*  CALCIUM 8.6  < > 9.2 9.2 9.0 8.9 8.6  MG 2.3  --   --   --  2.6*  --   --   PHOS 5.1*  --   --   --   --   --   --   < > = values in this interval not displayed. Liver Function Tests:  Recent Labs Lab 06/01/14 1649 06/02/14 06/04/14 2308 06/06/14 0430  AST 32 31 22 30   ALT 11 10 11 16   ALKPHOS 67 64 91 75  BILITOT 0.7 0.3 0.2* 0.4  PROT 6.7 6.6 6.2 5.9*  ALBUMIN 3.4* 3.2* 3.0* 3.0*   No results found for this basename: LIPASE, AMYLASE,  in the last 168 hours No results found for this basename: AMMONIA,  in the last 168 hours CBC:  Recent Labs Lab 06/01/14 1649 06/01/14 1656 06/02/14 06/03/14 0230 06/04/14 0221  WBC 8.3  --  7.5 14.6* 12.3*  NEUTROABS 6.7  --   --   --   --   HGB 12.0 13.3 11.8* 10.8* 11.1*  HCT 39.5 39.0 37.8 34.4* 35.5*  MCV 98.8  --  102.2* 96.9 96.2  PLT 103*  --  150 120* 139*   Cardiac Enzymes:  Recent Labs Lab 06/01/14 2016 06/02/14 06/02/14 0600 06/02/14 1355  TROPONINI 0.75* 0.48* <0.30 <0.30   BNP: BNP (last 3 results)  Recent Labs  12/04/13  2258 06/02/14  PROBNP 17479.0* 13998.0*   CBG:  Recent Labs Lab 06/06/14 1639 06/06/14 2042 06/07/14 0622 06/07/14 1124 06/07/14 1701  GLUCAP 311* 226* 156* 152* 229*   Signed:  Junious Silk, ANP [facility] 06/07/2014, 6:53 PM  I have personally examined this patient and reviewed the entire database. I have reviewed the above note, made any necessary editorial changes, and agree with its content.  +++On my review of this D/C Summary I found that the medication list initially listed Levaquin as a d/c med.  I called the pt at her home number, and spoke w/ her directly.  I explained to her that I DID NOT want her to take Levaquin under any circumstance, and that if it was listed on her DC Medication List that this was a mistake and that she should NOT take it.  She informed me that she HAD been told NOT to take it "because that is what caused by spell."  I felt confident she understood, and would be able to identify the medication by name.  I subsequently fixed her d/c orders, but Epic would not import these changes into this note.  I therefore manually corrected this note for accuracy.    Lonia Blood, MD [facility]

## 2014-06-07 NOTE — Progress Notes (Signed)
Patient Name: [name] MarvelGertrude S [name] Date of Encounter: 06/07/2014  Active Problems:   Hypertension   Atrial fibrillation   Chronic diastolic heart failure   Respiratory distress   COPD exacerbation   DM2 (diabetes mellitus, type 2)   Acute on chronic renal failure   Acute on chronic respiratory failure with hypoxemia   Demand ischemia   DM type 2 causing CKD stage 3    Patient Profile: Patient is a 80 y.o. female with a PMHx of chronic CHF, (Bi-V pacer) , atrial fib on coumadin, DM, HTN, COPD (home O2 at 5 lpm) and CKD admitted 10/10 for acute on chronic respiratory failure secondary to COPD exacerbation and CHF. EF 45% by echo. BNP [id number]. On ABX for URI/COPD (zithromax and Levaquin) ---> on 06/04/13 Developed 20 secs of polymorphic VT that spontaneously resolved. Qtc ~500 mg. K and Mg were normal. Antibiotics as well as PRN Zofran discontinued. No further recurrence.    SUBJECTIVE: No chest pain, feels breathing better, coughing up brown sputum.  OBJECTIVE Filed Vitals:   06/06/14 2053 06/06/14 2054 06/07/14 0215 06/07/14 0540  BP: 107/79   134/69  Pulse: 74   70  Temp: 98.3 F (36.8 C)   98.2 F (36.8 C)  TempSrc: Oral   Oral  Resp: 20   20  Height:      Weight:      SpO2: 97% 95% 96% 95%    Intake/Output Summary (Last 24 hours) at 06/07/14 0852 Last data filed at 06/06/14 1700  Gross per 24 hour  Intake    840 ml  Output    300 ml  Net    540 ml   Filed Weights   06/01/14 1638 06/02/14 0650 06/06/14 0512  Weight: 183 lb (83.008 kg) 186 lb (84.369 kg) 187 lb 6.3 oz (85 kg)    PHYSICAL EXAM General: Well developed, well nourished, female in no acute distress. Head: Normocephalic, atraumatic.  Neck: Supple without bruits, JVD minimal elevation. Lungs:  Resp regular and unlabored, bilateral rales. Heart: RRR, S1, S2, no S3, S4, or murmur; no rub. Abdomen: Soft, non-tender, non-distended, BS + x 4.  Extremities: No clubbing, cyanosis, no edema.  Neuro: Alert  and oriented X 3. Moves all extremities spontaneously. Psych: Normal affect.  LABS: CBC: Lab Results  Component Value Date   WBC 12.3* 06/04/2014   HGB 11.1* 06/04/2014   HCT 35.5* 06/04/2014   MCV 96.2 06/04/2014   PLT 139* 06/04/2014   INR:  Recent Labs  06/07/14 0405  INR 2.61*   Basic Metabolic Panel:  Recent Labs  40/98/1110/14/15 0126 06/06/14 0430 06/07/14 0405  NA 139 141 141  K 4.4 4.2 3.7  CL 98 99 99  CO2 27 28 29   GLUCOSE 226* 204* 165*  BUN 55* 57* 55*  CREATININE 1.98* 1.90* 1.93*  CALCIUM 9.0 8.9 8.6  MG 2.6*  --   --    Liver Function Tests:  Recent Labs  06/04/14 2308 06/06/14 0430  AST 22 30  ALT 11 16  ALKPHOS 91 75  BILITOT 0.2* 0.4  PROT 6.2 5.9*  ALBUMIN 3.0* 3.0*   BNP: Pro B Natriuretic peptide (BNP)  Date/Time Value Ref Range Status  06/02/2014 12:00 AM 13998.0* 0 - 450 pg/mL Final  12/04/2013 10:58 PM 17479.0* 0 - 450 pg/mL Final   Hemoglobin A1C:  Recent Labs  06/06/14 1335  HGBA1C 7.8*   Troponin I  Date Value Ref Range Status  06/02/2014 <0.30  <0.30  ng/mL Final  06/02/2014 <0.30  <0.30 ng/mL Final  06/02/2014 0.48* <0.30 ng/mL Final  06/01/2014 0.75* <0.30 ng/mL Final   TELE:  Atrial fib, V pacing. Occ PVCs      Radiology/Studies: Dg Chest Port 1 View 06/05/2014   CLINICAL DATA:  Subsequent evaluation for pneumonia, shortness of breath, hypoxia, current history of COPD  EXAM: PORTABLE CHEST - 1 VIEW  COMPARISON:  06/03/2014  FINDINGS: Moderate cardiac enlargement. Stable hyperinflation. Mildly prominent pulmonary arteries suggesting the possibility of pulmonary artery hypertension.  Bands of well-defined linear density in the lingula, mildly improved when compared to the prior study. No other areas of airspace abnormality. No pleural effusion. Stable cardiac pacer.  IMPRESSION: Improving discoid atelectasis in the lingula with with stable chronic abnormalities.   Electronically Signed   By: Esperanza Heiraymond  Rubner M.D.   On:  06/05/2014 10:56   Current Medications:  . atorvastatin  40 mg Oral q1800  . budesonide  0.25 mg Nebulization Q6H  . furosemide  40 mg Oral Daily  . guaiFENesin  600 mg Oral BID  . insulin aspart  0-15 Units Subcutaneous TID WC  . insulin aspart  0-5 Units Subcutaneous QHS  . insulin glargine  5 Units Subcutaneous Daily  . ipratropium-albuterol  3 mL Nebulization Q6H  . linagliptin  5 mg Oral Daily  . montelukast  10 mg Oral QHS  . predniSONE  40 mg Oral Q breakfast  . sodium chloride  3 mL Intravenous Q12H  . warfarin  2.5 mg Oral q1800  . Warfarin - Pharmacist Dosing Inpatient   Does not apply q1800      ASSESSMENT AND PLAN: Atrial fibrillation - mostly w/ V pacing, rate OK, continue current rx  Chronic systolic and diastolic heart failure - weight is trending up but previous weights generally > 190 and volume not much elevated by exam. Continue oral Lasix, daily weights and I/O. May need to increase oral Lasix to BID.  Demand ischemia - troponin elevation (0.75 now back to normal) on admission and new WMA on echo, ?Takotsubo CM. No cath secondary to CKD.4 On 2012 echo there was hypokinesis of RV apex. Both current echo and prior are not that much different when reviewed by Dr. Elease HashimotoNahser on consult 06/03/14. She does have a history of chronic systolic heart failure in review of Dr. Lubertha Basqueaylor's office visit from 08/28/13.  No ACE due to renal insufficiency, No BB due to respiratory issues. Will need to f/u as OP. Prior EF normal.   Otherwise, per IM. Active Problems:   Hypertension   Respiratory distress   COPD exacerbation   DM2 (diabetes mellitus, type 2)   Acute on chronic renal failure   Acute on chronic respiratory failure with hypoxemia   DM type 2 causing CKD stage 3   Signed, Theodore DemarkRhonda Barrett , PA-C 8:52 AM 06/07/2014  Personally seen and examined. Agree with above.  Donato SchultzSKAINS, Saqib Cazarez, MD

## 2014-06-07 NOTE — Discharge Instructions (Signed)
Diabetes Mellitus and Food °It is important for you to manage your blood sugar (glucose) level. Your blood glucose level can be greatly affected by what you eat. Eating healthier foods in the appropriate amounts throughout the day at about the same time each day will help you control your blood glucose level. It can also help slow or prevent worsening of your diabetes mellitus. Healthy eating may even help you improve the level of your blood pressure and reach or maintain a healthy weight.  °HOW CAN FOOD AFFECT ME? °Carbohydrates °Carbohydrates affect your blood glucose level more than any other type of food. Your dietitian will help you determine how many carbohydrates to eat at each meal and teach you how to count carbohydrates. Counting carbohydrates is important to keep your blood glucose at a healthy level, especially if you are using insulin or taking certain medicines for diabetes mellitus. °Alcohol °Alcohol can cause sudden decreases in blood glucose (hypoglycemia), especially if you use insulin or take certain medicines for diabetes mellitus. Hypoglycemia can be a life-threatening condition. Symptoms of hypoglycemia (sleepiness, dizziness, and disorientation) are similar to symptoms of having too much alcohol.  °If your health care provider has given you approval to drink alcohol, do so in moderation and use the following guidelines: °· Women should not have more than one drink per day, and men should not have more than two drinks per day. One drink is equal to: °¨ 12 oz of beer. °¨ 5 oz of wine. °¨ 1½ oz of hard liquor. °· Do not drink on an empty stomach. °· Keep yourself hydrated. Have water, diet soda, or unsweetened iced tea. °· Regular soda, juice, and other mixers might contain a lot of carbohydrates and should be counted. °WHAT FOODS ARE NOT RECOMMENDED? °As you make food choices, it is important to remember that all foods are not the same. Some foods have fewer nutrients per serving than other  foods, even though they might have the same number of calories or carbohydrates. It is difficult to get your body what it needs when you eat foods with fewer nutrients. Examples of foods that you should avoid that are high in calories and carbohydrates but low in nutrients include: °· Trans fats (most processed foods list trans fats on the Nutrition Facts label). °· Regular soda. °· Juice. °· Candy. °· Sweets, such as cake, pie, doughnuts, and cookies. °· Fried foods. °WHAT FOODS CAN I EAT? °Have nutrient-rich foods, which will nourish your body and keep you healthy. The food you should eat also will depend on several factors, including: °· The calories you need. °· The medicines you take. °· Your weight. °· Your blood glucose level. °· Your blood pressure level. °· Your cholesterol level. °You also should eat a variety of foods, including: °· Protein, such as meat, poultry, fish, tofu, nuts, and seeds (lean animal proteins are best). °· Fruits. °· Vegetables. °· Dairy products, such as milk, cheese, and yogurt (low fat is best). °· Breads, grains, pasta, cereal, rice, and beans. °· Fats such as olive oil, trans fat-free margarine, canola oil, avocado, and olives. °DOES EVERYONE WITH DIABETES MELLITUS HAVE THE SAME MEAL PLAN? °Because every person with diabetes mellitus is different, there is not one meal plan that works for everyone. It is very important that you meet with a dietitian who will help you create a meal plan that is just right for you. °Document Released: 05/06/2005 Document Revised: 08/14/2013 Document Reviewed: 07/06/2013 °[employer]® Patient Information ©2015 [employer]. This   information is not intended to replace advice given to you by your health care provider. Make sure you discuss any questions you have with your health care provider. Linagliptin oral tablets What is this medicine? Linagliptin (lin a GLIP tin) helps to treat type 2 diabetes. It helps to control blood sugar. Treatment is  combined with diet and exercise. This medicine may be used for other purposes; ask your health care provider or pharmacist if you have questions. COMMON BRAND NAME(S): Tradjenta What should I tell my health care provider before I take this medicine? They need to know if you have any of these conditions: -diabetic ketoacidosis -type 1 diabetes -an unusual or allergic reaction to linagliptin, other medicines, foods, dyes, or preservatives -pregnant or trying to get pregnant -breast-feeding How should I use this medicine? Take this medicine by mouth with a glass of water. Follow the directions on the prescription label. You can take it with or without food. Take your dose at the same time each day. Do not take more often than directed. Do not stop taking except on your doctor's advice. A special MedGuide will be given to you by the pharmacist with each prescription and refill. Be sure to read this information carefully each time. Talk to your pediatrician regarding the use of this medicine in children. Special care may be needed. Overdosage: If you think you've taken too much of this medicine contact a poison control center or emergency room at once. Overdosage: If you think you have taken too much of this medicine contact a poison control center or emergency room at once. NOTE: This medicine is only for you. Do not share this medicine with others. What if I miss a dose? If you miss a dose, take it as soon as you can. If it is almost time for your next dose, take only that dose. Do not take double or extra doses. What may interact with this medicine? Do not take this medicine with any of the following medications: -gatifloxacin This medicine may also interact with the following medications: -alcohol -bosentan -certain medicines for seizures like carbamazepine, phenobarbital, phenytoin -rifabutin -rifampin -St. Johns Wort -sulfonylureas like glimepiride, glipizide, glyburide This list may  not describe all possible interactions. Give your health care provider a list of all the medicines, herbs, non-prescription drugs, or dietary supplements you use. Also tell them if you smoke, drink alcohol, or use illegal drugs. Some items may interact with your medicine. What should I watch for while using this medicine? Visit your doctor or health care professional for regular checks on your progress. A test called the HbA1C (A1C) will be monitored. This is a simple blood test. It measures your blood sugar control over the last 2 to 3 months. You will receive this test every 3 to 6 months. Learn how to check your blood sugar. Learn the symptoms of low and high blood sugar and how to manage them. Always carry a quick-source of sugar with you in case you have symptoms of low blood sugar. Examples include hard sugar candy or glucose tablets. Make sure others know that you can choke if you eat or drink when you develop serious symptoms of low blood sugar, such as seizures or unconsciousness. They must get medical help at once. Tell your doctor or health care professional if you have high blood sugar. You might need to change the dose of your medicine. If you are sick or exercising more than usual, you might need to change the dose of your  medicine. Do not skip meals. Ask your doctor or health care professional if you should avoid alcohol. Many nonprescription cough and cold products contain sugar or alcohol. These can affect blood sugar. Wear a medical ID bracelet or chain, and carry a card that describes your disease and details of your medicine and dosage times. What side effects may I notice from receiving this medicine? Side effects that you should report to your doctor or health care professional as soon as possible: -allergic reactions like skin rash, itching or hives, swelling of the face, lips, or tongue -breathing problems -fever, chills -nausea, vomiting -signs and symptoms of low blood sugar  such as feeling anxious, confusion, dizziness, increased hunger, unusually weak or tired, sweating, shakiness, cold, irritable, headache, blurred vision, fast heartbeat, loss of consciousness -unusual stomach pain or discomfort -vomiting Side effects that usually do not require medical attention (Report these to your doctor or health care professional if they continue or are bothersome.): -headache -sore throat -stuffy or runny nose This list may not describe all possible side effects. Call your doctor for medical advice about side effects. You may report side effects to FDA at 1-800-FDA-1088. Where should I keep my medicine? Keep out of the reach of children. Store at room temperature between 15 and 30 degrees C (59 and 86 degrees F). Throw away any unused medicine after the expiration date. NOTE: This sheet is a summary. It may not cover all possible information. If you have questions about this medicine, talk to your doctor, pharmacist, or health care provider.  2015, [employer]. (2013-10-04 11:16:32)

## 2014-06-07 NOTE — Progress Notes (Signed)
Physical Therapy Treatment Patient Details Name: [name] MRN: [id number] DOB: December 07, 1931 Today's Date: 06/07/2014    History of Present Illness Pt is a 80 y.o. female presenting with 1 week history of worsening shortness of breath,  she developed diarrhea that lasted about 24 hours. Patient had Hx of COPD and asthma. Usually patient is on 5 L of oxygen at home but her family increased it to 6L. Patient presented with worsening shortness of breath requiring continuous nebulizer while in ER. Pt admitted for COPD exacerbation. Pt with episode of VT 10/13.    PT Comments    Pt progressing with gait today but continues to desat even on 6L as well as report fatigue with limited distance. Pt educated for energy conservation, pursed lip breathing, RW use and encouraged HEP to increase function. Will continue to follow.   Follow Up Recommendations  Home health PT;Supervision/Assistance - 24 hour     Equipment Recommendations       Recommendations for Other Services       Precautions / Restrictions Precautions Precautions: Fall Precaution Comments: watch sats    Mobility  Bed Mobility                  Transfers Overall transfer level: Needs assistance   Transfers: Sit to/from Stand Sit to Stand: Supervision         General transfer comment: cues for hand placement and to control descent  Ambulation/Gait Ambulation/Gait assistance: Supervision Ambulation Distance (Feet): 100 Feet (100, 15) Assistive device: Rolling walker (2 wheeled) Gait Pattern/deviations: Step-through pattern;Decreased stride length;Trunk flexed   Gait velocity interpretation: Below normal speed for age/gender General Gait Details: cues for posture, position in RW and pursed lip breathing with one standing rest during gait   Stairs            Wheelchair Mobility    Modified Rankin (Stroke Patients Only)       Balance                                     Cognition Arousal/Alertness: Awake/alert Behavior During Therapy: WFL for tasks assessed/performed Overall Cognitive Status: Within Functional Limits for tasks assessed                      Exercises General Exercises - Lower Extremity Long Arc Quad: AROM;Both;15 reps;Seated Hip ABduction/ADduction: AROM;Both;15 reps;Seated Hip Flexion/Marching: AROM;Both;15 reps;Seated    General Comments        Pertinent Vitals/Pain Pain Assessment: No/denies pain sats 87-90% on 6L with gait, 93% on 6L at rest HR 73-99    Home Living                      Prior Function            PT Goals (current goals can now be found in the care plan section) Progress towards PT goals: Progressing toward goals    Frequency       PT Plan Current plan remains appropriate    Co-evaluation             End of Session Equipment Utilized During Treatment: Oxygen Activity Tolerance: Patient tolerated treatment well Patient left: in chair;with call bell/phone within reach     Time: 1017-1042 PT Time Calculation (min): 25 min  Charges:  $Gait Training: 8-22 mins $Therapeutic Exercise: 8-22 mins  G CodesToney Sang:      Tabor, Kellin Bartling Beth 06/07/2014, 11:45 AM Delaney MeigsMaija Tabor Navi Ew[place], PT [phone]

## 2014-06-10 DIAGNOSIS — I129 Hypertensive chronic kidney disease with stage 1 through stage 4 chronic kidney disease, or unspecified chronic kidney disease: Secondary | ICD-10-CM

## 2014-06-10 DIAGNOSIS — J441 Chronic obstructive pulmonary disease with (acute) exacerbation: Secondary | ICD-10-CM

## 2014-06-10 DIAGNOSIS — E1165 Type 2 diabetes mellitus with hyperglycemia: Secondary | ICD-10-CM

## 2014-06-19 ENCOUNTER — Telehealth: Payer: Self-pay | Admitting: *Deleted

## 2014-06-19 ENCOUNTER — Ambulatory Visit: Payer: Self-pay | Admitting: Physician Assistant

## 2014-06-19 NOTE — Telephone Encounter (Signed)
[facility] requested an order for OT to help patient with ADL's.  Per Dr Oneta RackMcKeown, order denied.  Left message to inform Marisue IvanLiz.

## 2014-06-25 ENCOUNTER — Encounter: Payer: Self-pay | Admitting: Internal Medicine

## 2014-06-25 ENCOUNTER — Ambulatory Visit (INDEPENDENT_AMBULATORY_CARE_PROVIDER_SITE_OTHER): Payer: Medicare Other | Admitting: Internal Medicine

## 2014-06-25 VITALS — BP 104/60 | HR 80 | Temp 99.1°F | Resp 18 | Ht 63.0 in | Wt 179.0 lb

## 2014-06-25 DIAGNOSIS — I482 Chronic atrial fibrillation, unspecified: Secondary | ICD-10-CM

## 2014-06-25 DIAGNOSIS — J449 Chronic obstructive pulmonary disease, unspecified: Secondary | ICD-10-CM

## 2014-06-25 DIAGNOSIS — I10 Essential (primary) hypertension: Secondary | ICD-10-CM

## 2014-06-25 DIAGNOSIS — R6889 Other general symptoms and signs: Secondary | ICD-10-CM

## 2014-06-25 DIAGNOSIS — Z23 Encounter for immunization: Secondary | ICD-10-CM

## 2014-06-25 DIAGNOSIS — Z0001 Encounter for general adult medical examination with abnormal findings: Secondary | ICD-10-CM

## 2014-06-25 NOTE — Patient Instructions (Signed)

## 2014-06-25 NOTE — Progress Notes (Signed)
Patient ID: [name], female   DOB: [birth year], 80 y.o.   MRN: [id number]  MEDICARE ANNUAL WELLNESS VISIT AND Post Hospital Follow up OV  Assessment:   1. Essential hypertension  2. Obstructive chronic bronchitis - end stage  3. Chronic atrial fibrillation  4. Encounter for general adult medical examination with abnormal findings  5. Need for prophylactic vaccination against Streptococcus pneumoniae (pneumococcus)  - Pneumococcal conjugate vaccine 13-valent  6. 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 ASHD w/ hx/o NSTEMI & Pacer for CHB  8. Vitamin D Deficiency  9. Hyperlipidemia  10. GERD  11. DVT,hx/o   Plan:   During the course of the visit the patient was educated and counseled about appropriate screening and preventive services including:    Pneumococcal vaccine   Influenza vaccine  Td vaccine  Screening electrocardiogram  Bone densitometry screening  Colorectal cancer screening  Diabetes screening  Glaucoma screening  Nutrition counseling   Advanced directives: requested  Screening recommendations, referrals: Vaccinations: DT vaccine 2005 - deferred booster til next OV in Jan Influenza vaccine declined Pneumococcal vaccine 03/07/2013 Prevnar vaccine 06/25/2014 Shingles vaccine 08/26/2008 Hep B vaccine not indicated  Nutrition assessed and recommended  Colonoscopy 02/07/2012 & not felt pulmonary stable for f/u. Recommended yearly ophthalmology/optometry visit for glaucoma screening and checkup Recommended yearly dental visit for hygiene and checkup Advanced directives - yes  Conditions/risks identified: BMI: Discussed weight loss, diet, and increase physical activity.  Increase physical activity: AHA recommends 150 minutes of physical activity a week.  Medications reviewed Diabetes is not at goal, ACE/ARB therapy: No, Reason not on Ace Inhibitor/ARB therapy:  intolerant renal functions. Urinary Incontinence is not an issue: discussed non pharmacology  and pharmacology options.  Fall risk: moderate- discussed PT, home fall assessment, medications.   Subjective:   [name] is a 80 y.o. female who presents for Medicare Annual Wellness Visit and complete physical.  Date of last medicare wellness visit is unknown.   She has had elevated blood pressure since 1996. Her blood pressure has been controlled at home, & today their BP is BP: 104/60 mmHg She does not workout. She denies chest pain, shortness of breath, dizziness.  She is on cholesterol medication and denies myalgias. Her cholesterol is at goal. The cholesterol last visit was:  Lab Results  Component Value Date   CHOL 172 03/14/2014   HDL 54 03/14/2014   LDLCALC 71 03/14/2014   TRIG 237* 03/14/2014   CHOLHDL 3.2 03/14/2014    Patient has severe O2 dependent (5 lit) COPD and was recently hospitalized 10/10 -16/2015 with AECB and severe hypoxia and treated for bronchitis and CHF and BUN/Ctreat rose during hospitalization. She had a malignant ventricular arrhythmia attributed to Levaquin which was d/c'd. She returned home to independent living monitored by her children. On Oct 31 she tripped on her O2 tubing and sustained a blow to the right face & right shin. Apparently no serious injury was noted , but being on coumadin patient did have an extensive ecchymoses of the right face & shin.     She has had diabetes for 8 years (08/2005). During recent hospitalization diabetes exascerbated and was covered with insulin and as metformin was contraindicated due her renal insufficiency and glipizide was d/c'd , she wa sinitiated on Tradgenta with glucoses running in the low 200's.   She has not been able to work on diet and exercise for diabetes, and denies foot ulcerations, hypoglycemia , nausea, paresthesia of the feet, polydipsia, polyuria, visual disturbances and  vomiting. Last A1C in the office was:  Lab Results  Component Value Date   HGBA1C 7.8* 06/06/2014   Patient is on Vitamin D  supplement.   Lab Results  Component Value Date   VD25OH 77 03/14/2014     Names of Other Physician/Practitioners you currently use: 1. [facility] here for primary care 2. Dr Roger KillKinney, eye doctor, last visit 08/2013 3. none, dentist, last visit several years (dentures)  Patient Care Team: Lucky CowboyWilliam Amica Harron, MD as PCP - General (Internal Medicine) Marinus MawGregg W Taylor, MD as Consulting Physician (Cardiology)  Medication Review: Medication Sig  . albuterol (VENTOLIN HFA) 108 (90 BASE) MCG/ACT inhaler Inhale 2 puffs into the lungs every 6 (six) hours as needed. For shortness of breath.  Marland Kitchen. atorvastatin (LIPITOR) 40 MG tablet Take 1 tablet (40 mg total) by mouth daily at 6 PM.  . budesonide (PULMICORT) 0.25 MG/2ML nebulizer solution Take 2 mLs (0.25 mg total) by nebulization every 6 (six) hours.  . Cholecalciferol (VITAMIN D) 2000 UNITS tablet Take 5,000 Units by mouth daily.   Marland Kitchen. dexlansoprazole (DEXILANT) 60 MG capsule Take 60 mg by mouth daily.  . furosemide (LASIX) 40 MG tablet Take 40 mg by mouth daily.  Marland Kitchen. guaiFENesin (MUCINEX) 600 MG 12 hr tablet Take 1 tablet (600 mg total) by mouth 2 (two) times daily.  Marland Kitchen. linagliptin (TRADJENTA) 5 MG TABS tablet Take 1 tablet (5 mg total) by mouth daily.  . montelukast (SINGULAIR) 10 MG tablet Take 10 mg by mouth at bedtime.  . ONE TOUCH ULTRA TEST test strip USE TO CHECK BLOOD GLUCOSE ONCE A DAY AS INSTRUCTED  . PARoxetine (PAXIL) 20 MG tablet Take 20 mg by mouth daily.   . predniSONE (DELTASONE) 10 MG tablet Take 1 tablet (10 mg total) by mouth daily with breakfast.  . vitamin B-12 (CYANOCOBALAMIN) 1000 MCG tablet Take 1,000 mcg by mouth daily.  Marland Kitchen. warfarin (COUMADIN) 5 MG tablet Take 5 mg by mouth daily. Takes 1/2 tab daily=2.5 mg   Current Problems (verified) Patient Active Problem List   Diagnosis Date Noted  . Respiratory distress 06/01/2014  . COPD exacerbation 06/01/2014  . DM2 (diabetes mellitus, type 2)  06/01/2014  . Acute on chronic renal failure 06/01/2014  . Acute on chronic respiratory failure with hypoxemia 06/01/2014  . Demand ischemia 06/01/2014  . DM type 2 causing CKD stage 3 06/01/2014  . Chronic diastolic heart failure 01/07/2014  . Encounter for long-term (current) use of other medications 12/31/2013  . Long term (current) use of anticoagulants 12/19/2013  . Atrial fibrillation 12/17/2013  . Acute on chronic respiratory failure with hypoxia 12/05/2013  . NSTEMI (non-ST elevated myocardial infarction) 12/05/2013  . PNA (pneumonia) 12/05/2013  . Hyperlipidemia 09/11/2013  . Vitamin D Deficiency 09/11/2013  . Hypertension   . COPD (chronic obstructive pulmonary disease)   . T2 NIDDM w/Stage 2 CKD (GFR 40 ml/min)   . GERD (gastroesophageal reflux disease)   . DVT (deep venous thrombosis)   . B12 deficiency   . AV BLOCK, COMPLETE 01/22/2009  . Congestive heart failure, unspecified 01/22/2009  . PACEMAKER-St.Jude 01/22/2009  . PERSONAL HX COLONIC POLYPS 12/17/2008  . ANXIETY DEPRESSION 11/10/2007  . ABDOMINAL AORTIC ANEURYSM 11/10/2007  . ASTHMA 11/10/2007  . DIVERTICULOSIS, COLON 11/10/2007   Screening Tests Health Maintenance  Topic Date Due  . OPHTHALMOLOGY EXAM  12/01/1941  . COLONOSCOPY  02/07/2012  . TETANUS/TDAP  08/23/2013  . INFLUENZA VACCINE  03/23/2014  . HEMOGLOBIN A1C  12/06/2014  .  FOOT EXAM  03/15/2015  . URINE MICROALBUMIN  03/15/2015  . PNEUMOCOCCAL POLYSACCHARIDE VACCINE AGE 75 AND OVER  Completed  . ZOSTAVAX  Completed   Immunization History  Administered Date(s) Administered  . Pneumococcal Conjugate-13 06/25/2014  . Pneumococcal-Unspecified 08/23/2001, 03/07/2013  . Td 08/24/2003  . Zoster 08/26/2008   Preventative care: Last colonoscopy:2003  Prior vaccinations: TD : 2005  Influenza: declined  Pneumococcal: /16/2014 Prevnar: 06/25/2014 Shingles/Zostavax: 08/26/2008  History reviewed: allergies, current medications, past family  history, past medical history, past social history, past surgical history and problem list  Risk Factors: Tobacco History  Substance Use Topics  . Smoking status: Former Smoker    Quit date: 08/23/1988  . Smokeless tobacco: Never Used     Comment: quit in 1990, smoked 1 ppd x 30 years   . Alcohol Use: No   She does not smoke.  Patient is a former smoker. Are there smokers in your home (other than you)?  No  Alcohol Current alcohol use: none  Caffeine Current caffeine use: coffee 2-3 /day and caffeinated soft drinks 2-3 /day  Exercise Current exercise: chair exercises and stretching  Nutrition/Diet Current diet: in general, a "healthy" diet    Cardiac risk factors: advanced age (older than 59 for men, 77 for women), diabetes mellitus, dyslipidemia, hypertension and sedentary lifestyle.  Depression Screen (Note: if answer to either of the following is "Yes", a more complete depression screening is indicated)   Q1: Over the past two weeks, have you felt down, depressed or hopeless? No  Q2: Over the past two weeks, have you felt little interest or pleasure in doing things? No  Have you lost interest or pleasure in daily life? No  Do you often feel hopeless? No  Do you cry easily over simple problems? No  Activities of Daily Living In your present state of health, do you have any difficulty performing the following activities?:  Driving? no Managing money?  No Feeding yourself? No Getting from bed to chair? No Climbing a flight of stairs? yes Preparing food and eating?: No Bathing or showering? No Getting dressed: No Getting to the toilet? No Using the toilet:No Moving around from place to place: No In the past year have you fallen or had a near fall?:Yes   Are you sexually active?  No  Do you have more than one partner?  No  Vision Difficulties: No  Hearing Difficulties: No Do you often ask people to speak up or repeat themselves? No Do you experience ringing  or noises in your ears? No Do you have difficulty understanding soft or whispered voices? Sometimes.  Cognition  Do you feel that you have a problem with memory?No  Do you often misplace items? No  Do you feel safe at home?  Yes  Advanced directives Does patient have a Health Care Power of Attorney? Yes Does patient have a Living Will? Yes   Objective:     BP 104/60, pulse 80, temp 99.1 F , resp.18, ht 5\' 3"  , wt 179 lb  BMI 31.72  O2 Sat 92-93% on 5 LPM  General appearance: alert, no distress, WD/WN, female Cognitive Testing  Alert? Yes  Normal Appearance?Yes  Oriented to person? Yes  Place? Yes   Time? Yes  Recall of three objects?  Yes  Can perform simple calculations? Yes  Displays appropriate judgment? Yes  Can read the correct time from a watch/clock?Yes  HEENT: extensive ecchymoses of right face. EOM's Nl. normocephalic, sclerae anicteric, TMs pearly, nares patent,  no discharge or erythema, pharynx normal. No assymmetry noted. Oral cavity: MMM, no lesions Neck: supple, no lymphadenopathy, no thyromegaly, no masses Heart: irreg Rate & Rhythm, HS are distant. Lungs: Barrrel chested - mild kyphosis. BS equal & decreased.  Abdomen: +bs, soft, non tender, non distended, no masses. Musculoskeletal: nontender, no swelling, no obvious deformity Extremities: no edema, no cyanosis, no clubbing. Extensive ecchymoses of the right ant. shin. Pulses: 2+ symmetric, upper and lower extremities, normal cap refill Neurological: alert, oriented x 3, CN2-12 intact, strength normal upper extremities and lower extremities, sensation normal throughout, DTRs 2+ throughout, no cerebellar signs, gait normal Psychiatric: normal affect, behavior normal, pleasant   Medicare Attestation I have personally reviewed: The patient's medical and social history Their use of alcohol, tobacco or illicit drugs Their current medications and supplements The patient's functional ability including  ADLs,fall risks, home safety risks, cognitive, and hearing and visual impairment Diet and physical activities Evidence for depression or mood disorders  The patient's weight, height, BMI, and visual acuity have been recorded in the chart.  I have made referrals, counseling, and provided education to the patient based on review of the above and I have provided the patient with a written personalized care plan for preventive services.    Reno Clasby DAVID, MD   06/25/2014

## 2014-06-27 ENCOUNTER — Telehealth: Payer: Self-pay | Admitting: *Deleted

## 2014-06-27 ENCOUNTER — Ambulatory Visit (INDEPENDENT_AMBULATORY_CARE_PROVIDER_SITE_OTHER): Payer: Medicare Other | Admitting: *Deleted

## 2014-06-27 DIAGNOSIS — I4891 Unspecified atrial fibrillation: Secondary | ICD-10-CM

## 2014-06-27 LAB — PROTIME-INR: INR: 4.6 — AB (ref 0.9–1.1)

## 2014-06-27 LAB — POCT INR: INR: 7.2

## 2014-06-27 NOTE — Telephone Encounter (Signed)
See coumadin note. 

## 2014-06-28 ENCOUNTER — Ambulatory Visit: Payer: Self-pay | Admitting: Physician Assistant

## 2014-07-01 ENCOUNTER — Ambulatory Visit (INDEPENDENT_AMBULATORY_CARE_PROVIDER_SITE_OTHER): Payer: Medicare Other | Admitting: *Deleted

## 2014-07-01 DIAGNOSIS — I4891 Unspecified atrial fibrillation: Secondary | ICD-10-CM

## 2014-07-01 LAB — POCT INR: INR: 1.5

## 2014-07-04 ENCOUNTER — Telehealth: Payer: Self-pay | Admitting: *Deleted

## 2014-07-04 NOTE — Telephone Encounter (Signed)
Pt forgot to take coumadin last night.  She is taking 2.5mg  daily except 5mg  on Mondays, Wednesdays and Fridays.   Told pt to take coumadin 7.5mg  tonight then resume regular dose.  [facility] to recheck INR on Monday 07/08/14.  Pt verbalized understanding.

## 2014-07-08 ENCOUNTER — Ambulatory Visit (INDEPENDENT_AMBULATORY_CARE_PROVIDER_SITE_OTHER): Payer: Medicare Other | Admitting: *Deleted

## 2014-07-08 DIAGNOSIS — I4891 Unspecified atrial fibrillation: Secondary | ICD-10-CM

## 2014-07-08 LAB — POCT INR: INR: 4.7

## 2014-07-11 ENCOUNTER — Ambulatory Visit (INDEPENDENT_AMBULATORY_CARE_PROVIDER_SITE_OTHER): Payer: Medicare Other | Admitting: *Deleted

## 2014-07-11 DIAGNOSIS — I4891 Unspecified atrial fibrillation: Secondary | ICD-10-CM

## 2014-07-11 LAB — POCT INR: INR: 2.3

## 2014-07-15 ENCOUNTER — Telehealth: Payer: Self-pay | Admitting: *Deleted

## 2014-07-15 NOTE — Telephone Encounter (Signed)
Daughter called and confirmed it is OK for patient to use Pulmicort long term in her nebulizer.  Per Dr Oneta RackMcKeown, OK to continue use of drug.

## 2014-07-16 ENCOUNTER — Ambulatory Visit (INDEPENDENT_AMBULATORY_CARE_PROVIDER_SITE_OTHER): Payer: Medicare Other | Admitting: *Deleted

## 2014-07-16 DIAGNOSIS — I4891 Unspecified atrial fibrillation: Secondary | ICD-10-CM

## 2014-07-16 LAB — POCT INR: INR: 4

## 2014-07-16 MED ORDER — WARFARIN SODIUM 2 MG PO TABS: ORAL_TABLET | ORAL | Status: DC

## 2014-07-22 ENCOUNTER — Encounter: Payer: Self-pay | Admitting: Internal Medicine

## 2014-07-22 ENCOUNTER — Ambulatory Visit (INDEPENDENT_AMBULATORY_CARE_PROVIDER_SITE_OTHER): Payer: Medicare Other | Admitting: *Deleted

## 2014-07-22 ENCOUNTER — Ambulatory Visit (INDEPENDENT_AMBULATORY_CARE_PROVIDER_SITE_OTHER): Payer: Medicare Other | Admitting: Internal Medicine

## 2014-07-22 VITALS — BP 116/74 | HR 71 | Ht 63.0 in | Wt 178.0 lb

## 2014-07-22 DIAGNOSIS — I4891 Unspecified atrial fibrillation: Secondary | ICD-10-CM

## 2014-07-22 DIAGNOSIS — I482 Chronic atrial fibrillation, unspecified: Secondary | ICD-10-CM

## 2014-07-22 DIAGNOSIS — I442 Atrioventricular block, complete: Secondary | ICD-10-CM

## 2014-07-22 DIAGNOSIS — Z950 Presence of cardiac pacemaker: Secondary | ICD-10-CM

## 2014-07-22 LAB — MDC_IDC_ENUM_SESS_TYPE_INCLINIC
Battery Voltage: 2.97 V
Brady Statistic RV Percent Paced: 97 %
Date Time Interrogation Session: 20151130105331
Implantable Pulse Generator Model: 8042
Lead Channel Impedance Value: 0 Ohm
Lead Channel Impedance Value: 0 Ohm
Lead Channel Impedance Value: 558 Ohm
Lead Channel Pacing Threshold Amplitude: 0.5 V
Lead Channel Pacing Threshold Pulse Width: 0.4 ms
Lead Channel Setting Pacing Amplitude: 2.5 V
Lead Channel Setting Pacing Pulse Width: 0.4 ms
MDC IDC MSMT BATTERY REMAINING LONGEVITY: 52 mo
MDC IDC MSMT LEADCHNL RV SENSING INTR AMPL: 11.2 mV
MDC IDC SET LEADCHNL RV SENSING SENSITIVITY: 2.8 mV

## 2014-07-22 LAB — POCT INR: INR: 1.8

## 2014-07-22 NOTE — Patient Instructions (Addendum)
Your physician recommends that you schedule a follow-up appointment in: 1 year with Dr. [facility]. You will receive a reminder letter in the mail in about 10 months reminding you to call and schedule your appointment. If you don't receive this letter, please contact our office. Your physician recommends that you schedule a follow-up appointment in: 3 months with Belenda CruiseKristin in the device clinic. Your physician recommends that you continue on your current medications as directed. Please refer to the Current Medication list given to you today.

## 2014-07-22 NOTE — Progress Notes (Signed)
HPI [name] returns today for followup. She is a pleasant and 80 year old woman with a history of chronic systolic heart failure, complete heart block, hypertension, and severe COPD on home oxygen. She was hospitalized 10/15 with COPD exacerbation.  On Levaquin and azithromycin, she had prolonged QT and polymorphic VT.  This resolved off of these medicines.  She remains frail and on home O2.  She has a chronic cough and shortness of breath, but no peripheral edema. No syncope. Her left ventricular lead was turned off secondary to diaphragmatic stimulation previuosly.  She is in afib all of the time.  Allergies  Allergen Reactions  . Advair Diskus [Fluticasone-Salmeterol]     Itching  . Biaxin [Clarithromycin]     N/V  . Ciprofloxacin     N/V  . Doxycycline Other (See Comments)    Inside of mouth and tongue red and peeled  . Keflex [Cephalexin]     N/V  . Ketek [Telithromycin]     N/V  . Levaquin [Levofloxacin In D5w] Other (See Comments)    Arrythmia, polymorphic Vtach on 10/15 hospitalization  . Macrobid [Nitrofurantoin]     N/V  . Penicillins     Rash  . Sulfonamide Derivatives      Current Outpatient Prescriptions  Medication Sig Dispense Refill  . albuterol (VENTOLIN HFA) 108 (90 BASE) MCG/ACT inhaler Inhale 2 puffs into the lungs every 6 (six) hours as needed. For shortness of breath. 1 Inhaler prn  . budesonide (PULMICORT) 0.25 MG/2ML nebulizer solution Take 2 mLs (0.25 mg total) by nebulization every 6 (six) hours. 60 mL 12  . Cholecalciferol (VITAMIN D) 2000 UNITS tablet Take 5,000 Units by mouth daily.     Marland Kitchen. dexlansoprazole (DEXILANT) 60 MG capsule Take 60 mg by mouth daily.    . furosemide (LASIX) 40 MG tablet Take 40 mg by mouth daily.    Marland Kitchen. linagliptin (TRADJENTA) 5 MG TABS tablet Take 1 tablet (5 mg total) by mouth daily. 30 tablet 0  . metFORMIN (GLUCOPHAGE) 500 MG tablet Take 500 mg by mouth once.    . montelukast (SINGULAIR) 10 MG tablet Take 10 mg by mouth at  bedtime.    . ONE TOUCH ULTRA TEST test strip USE TO CHECK BLOOD GLUCOSE ONCE A DAY AS INSTRUCTED 100 each 99  . PARoxetine (PAXIL) 20 MG tablet Take 20 mg by mouth daily.     . predniSONE (DELTASONE) 10 MG tablet Take 1 tablet (10 mg total) by mouth daily with breakfast.    . simvastatin (ZOCOR) 80 MG tablet Take 80 mg by mouth as directed. Take 1/2 tablet M,W,F    . tiotropium (SPIRIVA) 18 MCG inhalation capsule Place 18 mcg into inhaler and inhale daily.    . vitamin B-12 (CYANOCOBALAMIN) 1000 MCG tablet Take 1,000 mcg by mouth daily.    Marland Kitchen. warfarin (COUMADIN) 2 MG tablet Take 1 tablet daily or as directed 45 tablet 3   No current facility-administered medications for this visit.     Past Medical History  Diagnosis Date  . AV block, complete   . CHF (congestive heart failure)   . AAA (abdominal aortic aneurysm)   . Dyslipidemia   . History of colonic polyps   . Anxiety and depression   . Diverticulosis of colon   . Asthma   . Acute renal failure   . Atrial flutter   . Hyperglycemia   . UTI (lower urinary tract infection)   . COPD (chronic obstructive pulmonary disease)   .  HTN (hypertension)   . DM (diabetes mellitus)   . GERD (gastroesophageal reflux disease)   . DVT (deep venous thrombosis)   . B12 deficiency   . Angiodysplasia of intestine (without mention of hemorrhage)     ROS:   All systems reviewed and negative except as noted in the HPI.   Past Surgical History  Procedure Laterality Date  . Tubal ligation    . Abdominal hysterectomy    . Appendectomy    . Pacemaker insertion      medtronic   . Colonoscopy  02/06/2002    [id number],[phone],562.10  . Bladder suspension       Family History  Problem Relation Age of Onset  . Emphysema Maternal Uncle     multiple   . Asthma Daughter   . Colon cancer Sister     alive at [id number]   . Heart disease Father   . CVA Mother   . Heart disease Mother      History   Social History  . Marital Status: Widowed    Spouse  Name: N/A    Number of Children: N/A  . Years of Education: N/A   Occupational History  . Not on file.   Social History Main Topics  . Smoking status: Former Smoker    Start date: 11/30/1953    Quit date: 08/23/1988  . Smokeless tobacco: Never Used     Comment: quit in 1990, smoked 1 ppd x 30 years   . Alcohol Use: No  . Drug Use: No  . Sexual Activity: Not on file   Other Topics Concern  . Not on file   Social History Narrative   Married, 4 children; retired Associate Professorretail worker.      BP 116/74 mmHg  Pulse 71  Ht 5\' 3"  (1.6 m)  Wt 178 lb (80.74 kg)  BMI 31.54 kg/m2  SpO2 95%  Physical Exam:  Stable but chronically ill appearing 80 year old woman, NAD HEENT: OP clear Neck:  7 cm JVD, no thyromegally Lungs:  Scattered rales in the bases with no wheezes HEART:  Regular rate rhythm (paced) Abd:  soft, positive bowel sounds, no organomegally, no rebound, no guarding Ext:  2 plus pulses, no edema, no cyanosis, no clubbing Skin:  No rashes no nodules Neuro:  CN II through XII intact, motor grossly intact  DEVICE  Normal device function.  See PaceArt for details. Left ventricular lead remains turned off. Underlying rhythm is atrial fib with a controlled VR.  Assess/Plan:   1. Complete heart block Normal device function, programmed VVIR with LV lead turned off due to diaphragmatic stim She has a MDT InSynch III 8042 device.  We discussed the recent MDT advisory with this device.  Given her advanced age and the fact that she is not dependant, she and her daughter are clear that they would prefer to avoid any surgery at this time.  She will therefore be followed in the device clinic every 3 months.  She is aware that there is risk that her device could fail and that she may require premature generator change.  2. Long QT/ PMVT Resolved off of levaquin and azithromycin  3. Chronic systolic dysfunction Stable No change required today  4. Chronic afib Continue long term  anticoagulation and rate control She really does not have good AAD options  She will follow with Dr Ladona Ridgelaylor as scheduled She will need to be seen in the device clinic every 3 months

## 2014-07-23 ENCOUNTER — Encounter: Payer: Self-pay | Admitting: Internal Medicine

## 2014-08-01 ENCOUNTER — Ambulatory Visit (INDEPENDENT_AMBULATORY_CARE_PROVIDER_SITE_OTHER): Payer: Medicare Other | Admitting: *Deleted

## 2014-08-01 DIAGNOSIS — I4891 Unspecified atrial fibrillation: Secondary | ICD-10-CM

## 2014-08-01 LAB — POCT INR: INR: 1.7

## 2014-08-03 ENCOUNTER — Other Ambulatory Visit: Payer: Self-pay | Admitting: Internal Medicine

## 2014-08-09 ENCOUNTER — Ambulatory Visit (INDEPENDENT_AMBULATORY_CARE_PROVIDER_SITE_OTHER): Payer: Medicare Other | Admitting: Internal Medicine

## 2014-08-09 ENCOUNTER — Encounter: Payer: Self-pay | Admitting: Internal Medicine

## 2014-08-09 VITALS — BP 140/82 | HR 92 | Temp 97.9°F | Resp 24 | Ht 63.0 in | Wt 182.6 lb

## 2014-08-09 DIAGNOSIS — I150 Renovascular hypertension: Secondary | ICD-10-CM

## 2014-08-09 DIAGNOSIS — J4489 Other specified chronic obstructive pulmonary disease: Secondary | ICD-10-CM

## 2014-08-09 DIAGNOSIS — E559 Vitamin D deficiency, unspecified: Secondary | ICD-10-CM

## 2014-08-09 DIAGNOSIS — E1122 Type 2 diabetes mellitus with diabetic chronic kidney disease: Secondary | ICD-10-CM

## 2014-08-09 DIAGNOSIS — J449 Chronic obstructive pulmonary disease, unspecified: Secondary | ICD-10-CM

## 2014-08-09 DIAGNOSIS — E782 Mixed hyperlipidemia: Secondary | ICD-10-CM

## 2014-08-09 DIAGNOSIS — N183 Chronic kidney disease, stage 3 (moderate): Secondary | ICD-10-CM

## 2014-08-09 DIAGNOSIS — Z79899 Other long term (current) drug therapy: Secondary | ICD-10-CM

## 2014-08-09 LAB — BASIC METABOLIC PANEL WITH GFR
BUN: 16 mg/dL (ref 6–23)
CHLORIDE: 99 meq/L (ref 96–112)
CO2: 34 meq/L — AB (ref 19–32)
CREATININE: 1.45 mg/dL — AB (ref 0.50–1.10)
Calcium: 9.6 mg/dL (ref 8.4–10.5)
GFR, Est African American: 39 mL/min — ABNORMAL LOW
GFR, Est Non African American: 34 mL/min — ABNORMAL LOW
GLUCOSE: 168 mg/dL — AB (ref 70–99)
Potassium: 3.9 mEq/L (ref 3.5–5.3)
Sodium: 148 mEq/L — ABNORMAL HIGH (ref 135–145)

## 2014-08-09 LAB — HEPATIC FUNCTION PANEL
ALBUMIN: 3.9 g/dL (ref 3.5–5.2)
AST: 14 U/L (ref 0–37)
Alkaline Phosphatase: 64 U/L (ref 39–117)
Bilirubin, Direct: 0.1 mg/dL (ref 0.0–0.3)
Indirect Bilirubin: 0.4 mg/dL (ref 0.2–1.2)
TOTAL PROTEIN: 6.2 g/dL (ref 6.0–8.3)
Total Bilirubin: 0.5 mg/dL (ref 0.2–1.2)

## 2014-08-09 LAB — LIPID PANEL
Cholesterol: 159 mg/dL (ref 0–200)
HDL: 54 mg/dL (ref >39)
LDL CALC: 50 mg/dL (ref 0–99)
Total CHOL/HDL Ratio: 2.9 Ratio
Triglycerides: 276 mg/dL — ABNORMAL HIGH (ref <150)
VLDL: 55 mg/dL — ABNORMAL HIGH (ref 0–40)

## 2014-08-09 LAB — TSH: TSH: 2.631 u[IU]/mL (ref 0.350–4.500)

## 2014-08-09 LAB — MAGNESIUM: Magnesium: 1.8 mg/dL (ref 1.5–2.5)

## 2014-08-09 MED ORDER — LEVALBUTEROL HCL 1.25 MG/3ML IN NEBU: INHALATION_SOLUTION | RESPIRATORY_TRACT | Status: DC

## 2014-08-09 MED ORDER — LEVALBUTEROL TARTRATE 45 MCG/ACT IN AERO: INHALATION_SPRAY | RESPIRATORY_TRACT | Status: DC

## 2014-08-09 MED ORDER — AZITHROMYCIN 250 MG PO TABS: ORAL_TABLET | ORAL | Status: AC

## 2014-08-10 LAB — CBC WITH DIFFERENTIAL/PLATELET
BASOS ABS: 0 10*3/uL (ref 0.0–0.1)
Basophils Relative: 0 % (ref 0–1)
EOS PCT: 1 % (ref 0–5)
Eosinophils Absolute: 0.1 10*3/uL (ref 0.0–0.7)
HEMATOCRIT: 38.7 % (ref 36.0–46.0)
Hemoglobin: 11.6 g/dL — ABNORMAL LOW (ref 12.0–15.0)
LYMPHS ABS: 2 10*3/uL (ref 0.7–4.0)
LYMPHS PCT: 18 % (ref 12–46)
MCH: 29.5 pg (ref 26.0–34.0)
MCHC: 30 g/dL (ref 30.0–36.0)
MCV: 98.5 fL (ref 78.0–100.0)
MONO ABS: 0.7 10*3/uL (ref 0.1–1.0)
MONOS PCT: 6 % (ref 3–12)
MPV: 11.6 fL (ref 9.4–12.4)
Neutro Abs: 8.4 10*3/uL — ABNORMAL HIGH (ref 1.7–7.7)
Neutrophils Relative %: 75 % (ref 43–77)
Platelets: 187 10*3/uL (ref 150–400)
RBC: 3.93 MIL/uL (ref 3.87–5.11)
RDW: 14.4 % (ref 11.5–15.5)
WBC: 11.2 10*3/uL — ABNORMAL HIGH (ref 4.0–10.5)

## 2014-08-10 LAB — INSULIN, FASTING: Insulin fasting, serum: 14.2 u[IU]/mL (ref 2.0–19.6)

## 2014-08-10 LAB — VITAMIN D 25 HYDROXY (VIT D DEFICIENCY, FRACTURES): VIT D 25 HYDROXY: 61 ng/mL (ref 30–100)

## 2014-08-11 NOTE — Progress Notes (Signed)
Subjective:    Patient ID: [name], female    DOB: 1932-08-19, 80 y.o.   MRN: [id number]  HPI  Very nice 80 yo WWF with multiple Medical problems including HTN, HLD, T2DM/CKD3, chAfib, chCHF, CHB w/ Per pacer, ES O2 dep COPD w/ cor pulmonale who returns for f/u post tx for PNA. Pt is using prn Lasix to cover her dependent edema. She's also on maintenance with prednisone and reports CBG's range betw 114-140 mg%. Has had some intermittent assymetric swelling of the Lt.>Rt LE which is not evident today. Pt is c/o worse tremor of the hands post Neb & MDI bronchodilator tx's.   Medication Sig  . budesonide (PULMICORT) 0.25 MG/2ML nebulizer solution Take 2 mLs (0.25 mg total) by nebulization every 6 (six) hours.  . Cholecalciferol (VITAMIN D) 2000 UNITS tablet Take 5,000 Units by mouth daily.   Marland Kitchen. dexlansoprazole (DEXILANT) 60 MG capsule Take 60 mg by mouth daily.  . furosemide (LASIX) 40 MG tablet TAKE DAILY AND AS NEEDED FOR  WEIGHT OVER 181  . linagliptin (TRADJENTA) 5 MG TABS tablet Take 1 tablet (5 mg total) by mouth daily.  . montelukast (SINGULAIR) 10 MG tablet Take 10 mg by mouth at bedtime.  . ONE TOUCH ULTRA TEST test strip USE TO CHECK BLOOD GLUCOSE ONCE A DAY AS INSTRUCTED  . PARoxetine (PAXIL) 20 MG tablet Take 20 mg by mouth daily.   . predniSONE (DELTASONE) 10 MG tablet Take 1 tablet (10 mg total) by mouth daily with breakfast.  . simvastatin (ZOCOR) 80 MG tablet Take 80 mg by mouth as directed. Take 1/2 tablet M,W,F  . tiotropium (SPIRIVA) 18 MCG inhalation capsule Place 18 mcg into inhaler and inhale daily.  . vitamin B-12 (CYANOCOBALAMIN) 1000 MCG tablet Take 1,000 mcg by mouth daily.  Marland Kitchen. warfarin (COUMADIN) 2 MG tablet Take 1 tablet daily or as directed  . albuterol (VENTOLIN HFA) 108 (90 BASE) MCG/ACT inhaler Inhale 2 puffs into the lungs every 6 (six) hours as needed. For shortness of breath.  . metFORMIN (GLUCOPHAGE) 500 MG tablet Take 500 mg by mouth once.   Allergies   Allergen Reactions  . Advair Diskus [Fluticasone-Salmeterol]     Itching  . Biaxin [Clarithromycin]     N/V  . Ciprofloxacin     N/V  . Doxycycline Other (See Comments)    Inside of mouth and tongue red and peeled  . Keflex [Cephalexin]     N/V  . Ketek [Telithromycin]     N/V  . Levaquin [Levofloxacin In D5w] Other (See Comments)    Arrythmia, polymorphic Vtach on 10/15 hospitalization  . Macrobid [Nitrofurantoin]     N/V  . Penicillins     Rash  . Sulfonamide Derivatives    Past Medical History  Diagnosis Date  . AV block, complete   . CHF (congestive heart failure)   . AAA (abdominal aortic aneurysm)   . Dyslipidemia   . History of colonic polyps   . Anxiety and depression   . Diverticulosis of colon   . Asthma   . Acute renal failure   . Atrial flutter   . Hyperglycemia   . UTI (lower urinary tract infection)   . COPD (chronic obstructive pulmonary disease)   . HTN (hypertension)   . DM (diabetes mellitus)   . GERD (gastroesophageal reflux disease)   . DVT (deep venous thrombosis)   . B12 deficiency   . Angiodysplasia of intestine (without mention of hemorrhage)    Past  Surgical History  Procedure Laterality Date  . Tubal ligation    . Abdominal hysterectomy    . Appendectomy    . Pacemaker insertion      medtronic   . Colonoscopy  02/06/2002    161.09,[id number],562.10  . Bladder suspension     Review of Systems   Objective:   Physical Exam  BP 140/82  Pulse 92  Temp 97.9 F   Resp 24  Ht 5\' 3"    Wt 182 lb 9.6 oz         BMI 32.35  HEENT - Eac's patent. TM's Nl. EOM's full. PERRLA. NasoOroPharynx clear. Neck - supple. Nl Thyroid. Carotids 2+ & No bruits, nodes, JVD Chest - Barrel chested w/ increased AP diameter & very distant  BS w/o Rales, rhonchi, wheezes. Cor - iRRR w/ soft sys M. PP 1(+). 1(+) pretibial/ankle edema. Abd - No palpable organomegaly, masses or tenderness. BS nl. MS- FROM w/o deformities. Muscle power, tone and bulk decreased and  consistent with age and deconditioning. Gait Nl. Neuro - No obvious Cr N abnormalities. Moderate high frequency fine to median amplitude tremor of hands. Sensory, motor and Cerebellar functions appear Nl w/o focal abnormalities. Psyche - Mental status normal & appropriate.  No delusions, ideations or obvious mood abnormalities.    Assessment & Plan:   1. Chronic obstructive airway disease with asthma  - Change Albuterol to Levalbuterol to lessen tremor.  - levalbuterol (XOPENEX HFA) 45 MCG/ACT inhaler; Take 1 - 2 puffs -  5 minutes apart  - 4 x day or every 4 hours as needed to rescue Asthma  Dispense: 1 Inhaler; Refill: 99 - levalbuterol (XOPENEX) 1.25 MG/3ML nebulizer solution; 1 neb treatment 4 x day  Dispense: 360 mL; Refill: 12  - Rx Z pak to hold   2. Renovascular hypertension  - TSH  3. DM type 2 causing CKD stage 3  - Insulin, fasting  4. Mixed hyperlipidemia  - Lipid panel  5. Vitamin D deficiency  - Vit D  25 hydroxy (rtn osteoporosis monitoring)  6. Medication management  - CBC with Differential - BASIC METABOLIC PANEL WITH GFR - Hepatic function panel - Magnesium  ROV 6 weeks to monitor respiratory status.

## 2014-08-12 ENCOUNTER — Other Ambulatory Visit: Payer: Self-pay | Admitting: *Deleted

## 2014-08-12 DIAGNOSIS — Z1212 Encounter for screening for malignant neoplasm of rectum: Secondary | ICD-10-CM

## 2014-08-12 LAB — POC HEMOCCULT BLD/STL (HOME/3-CARD/SCREEN)
Card #2 Fecal Occult Blod, POC: NEGATIVE
Card #3 Fecal Occult Blood, POC: NEGATIVE
Fecal Occult Blood, POC: NEGATIVE

## 2014-08-13 ENCOUNTER — Ambulatory Visit (INDEPENDENT_AMBULATORY_CARE_PROVIDER_SITE_OTHER): Payer: Medicare Other | Admitting: *Deleted

## 2014-08-13 DIAGNOSIS — I4891 Unspecified atrial fibrillation: Secondary | ICD-10-CM

## 2014-08-13 LAB — POCT INR: INR: 1.9

## 2014-08-30 ENCOUNTER — Telehealth: Payer: Self-pay | Admitting: *Deleted

## 2014-08-30 ENCOUNTER — Encounter: Payer: Medicare Other | Admitting: Internal Medicine

## 2014-08-30 NOTE — Telephone Encounter (Signed)
Dawn with [facility] called stating that [name] is wanting to start having her INR checked at home thru [facility]. Please call [phone] [facility] or Selena BattenKim.

## 2014-09-05 NOTE — Telephone Encounter (Signed)
Spoke with [employer] This is a home monitoring company.  States pt's daughter [name] has requested home monitoring.  [facility] to [facility] that it is our policy not to do home monitoring unless pt is bed bound and not able to come to MD office.  Pt has been driving to the office at the present time.  Pt has an appt with me on 09/10/14.  I will talk with pt about it then and Dawn will let pt's daughter know.

## 2014-09-08 DIAGNOSIS — J4520 Mild intermittent asthma, uncomplicated: Secondary | ICD-10-CM | POA: Diagnosis not present

## 2014-09-08 DIAGNOSIS — J449 Chronic obstructive pulmonary disease, unspecified: Secondary | ICD-10-CM | POA: Diagnosis not present

## 2014-09-10 ENCOUNTER — Ambulatory Visit (INDEPENDENT_AMBULATORY_CARE_PROVIDER_SITE_OTHER): Payer: Medicare Other | Admitting: *Deleted

## 2014-09-10 DIAGNOSIS — I4891 Unspecified atrial fibrillation: Secondary | ICD-10-CM | POA: Diagnosis not present

## 2014-09-10 LAB — POCT INR: INR: 1.5

## 2014-09-18 ENCOUNTER — Ambulatory Visit (INDEPENDENT_AMBULATORY_CARE_PROVIDER_SITE_OTHER): Payer: Medicare Other | Admitting: *Deleted

## 2014-09-18 DIAGNOSIS — I480 Paroxysmal atrial fibrillation: Secondary | ICD-10-CM

## 2014-09-18 LAB — POCT INR: INR: 2.2

## 2014-09-24 LAB — POCT INR: INR: 1.8

## 2014-09-25 ENCOUNTER — Ambulatory Visit (INDEPENDENT_AMBULATORY_CARE_PROVIDER_SITE_OTHER): Payer: Medicare Other | Admitting: *Deleted

## 2014-09-25 DIAGNOSIS — I4891 Unspecified atrial fibrillation: Secondary | ICD-10-CM

## 2014-09-26 ENCOUNTER — Ambulatory Visit (INDEPENDENT_AMBULATORY_CARE_PROVIDER_SITE_OTHER): Payer: Medicare Other | Admitting: Internal Medicine

## 2014-09-26 ENCOUNTER — Encounter: Payer: Self-pay | Admitting: Internal Medicine

## 2014-09-26 VITALS — BP 126/54 | HR 92 | Temp 98.1°F | Resp 18 | Ht 63.0 in | Wt 182.6 lb

## 2014-09-26 DIAGNOSIS — I482 Chronic atrial fibrillation, unspecified: Secondary | ICD-10-CM

## 2014-09-26 DIAGNOSIS — E782 Mixed hyperlipidemia: Secondary | ICD-10-CM | POA: Diagnosis not present

## 2014-09-26 DIAGNOSIS — I82409 Acute embolism and thrombosis of unspecified deep veins of unspecified lower extremity: Secondary | ICD-10-CM

## 2014-09-26 DIAGNOSIS — E1121 Type 2 diabetes mellitus with diabetic nephropathy: Secondary | ICD-10-CM | POA: Diagnosis not present

## 2014-09-26 DIAGNOSIS — E559 Vitamin D deficiency, unspecified: Secondary | ICD-10-CM | POA: Diagnosis not present

## 2014-09-26 DIAGNOSIS — I150 Renovascular hypertension: Secondary | ICD-10-CM

## 2014-09-26 DIAGNOSIS — Z79899 Other long term (current) drug therapy: Secondary | ICD-10-CM | POA: Diagnosis not present

## 2014-09-26 DIAGNOSIS — E538 Deficiency of other specified B group vitamins: Secondary | ICD-10-CM

## 2014-09-26 LAB — CBC WITH DIFFERENTIAL/PLATELET
BASOS ABS: 0 10*3/uL (ref 0.0–0.1)
Basophils Relative: 0 % (ref 0–1)
EOS ABS: 0 10*3/uL (ref 0.0–0.7)
Eosinophils Relative: 0 % (ref 0–5)
HEMATOCRIT: 35.8 % — AB (ref 36.0–46.0)
Hemoglobin: 11 g/dL — ABNORMAL LOW (ref 12.0–15.0)
Lymphocytes Relative: 11 % — ABNORMAL LOW (ref 12–46)
Lymphs Abs: 0.9 10*3/uL (ref 0.7–4.0)
MCH: 27.8 pg (ref 26.0–34.0)
MCHC: 30.7 g/dL (ref 30.0–36.0)
MCV: 90.4 fL (ref 78.0–100.0)
MPV: 10.3 fL (ref 8.6–12.4)
Monocytes Absolute: 0.4 10*3/uL (ref 0.1–1.0)
Monocytes Relative: 5 % (ref 3–12)
NEUTROS ABS: 7 10*3/uL (ref 1.7–7.7)
Neutrophils Relative %: 84 % — ABNORMAL HIGH (ref 43–77)
Platelets: 165 10*3/uL (ref 150–400)
RBC: 3.96 MIL/uL (ref 3.87–5.11)
RDW: 14.7 % (ref 11.5–15.5)
WBC: 8.3 10*3/uL (ref 4.0–10.5)

## 2014-09-26 MED ORDER — APIXABAN 5 MG PO TABS: ORAL_TABLET | ORAL | Status: DC

## 2014-09-26 NOTE — Progress Notes (Signed)
Patient ID: [name], female   DOB: [birth year], 81 y.o.   MRN: [id number]   This very nice 81 y.o. [name] presents for 79 month follow up with Hypertension, Hyperlipidemia, T2_DM w/ CKD and Vitamin D Deficiency.  Patient also has End Stage COPD with O2 sats at home usu running in the low 80's at rest despite O2 flow rates of 5 LPM.    Patient is treated for HTN & BP has been controlled at home.Patient has chAfib & is on Coumadin thru the  Coumadin clinic and pt's daughter [name] (who works as a Engineer, site for [employer] next door) relates that she has received their approval to switch her mother from coumadin to Eliquis  Due to the wide variability of her dosing response.  Today's BP is 126/54. Patient has had no complaints of any cardiac type chest pain, palpitations, dyspnea/orthopnea/PND, dizziness, claudication, or dependent edema.   Hyperlipidemia is controlled with diet & meds. Patient denies myalgias or other med SE's. Last Lipids were 08/09/2014: Cholesterol, Total 159; HDL Cholesterol by NMR 54; LDL (calc) 50; with elevatedTrig 276 on    Also, the patient has history of T2_NIDDM  and has had no symptoms of reactive hypoglycemia, diabetic polys, paresthesias or visual blurring.  Last A1c was 7.8% on  06/06/2014.   Further, the patient also has history of Vitamin D Deficiency (Vit D = 6 in 2008) and supplements vitamin D without any suspected side-effects. Last vitamin D was  61 on 08/09/2014.   Medication Sig  . atorvastatin (LIPITOR) 40 MG tablet   . budesonide (PULMICORT) 0.25 MG/2ML nebulizer solution Take 2 mLs (0.25 mg total) by nebulization every 6 (six) hours.  . Cholecalciferol (VITAMIN D) 2000 UNITS tablet Take 5,000 Units by mouth daily.   Marland Kitchen dexlansoprazole (DEXILANT) 60 MG capsule Take 60 mg by mouth daily.  . furosemide (LASIX) 40 MG tablet TAKE DAILY AND AS NEEDED FOR  WEIGHT OVER 181  . levalbuterol (XOPENEX HFA) 45 MCG/ACT inhaler Take 1 - 2 puffs -  5  minutes apart  - 4 x day or every 4 hours as needed to rescue Asthma  . levalbuterol (XOPENEX) 1.25 MG/3ML nebulizer solution 1 neb treatment 4 x day  . linagliptin (TRADJENTA) 5 MG TABS tablet Take 1 tablet (5 mg total) by mouth daily.  . metFORMIN (GLUCOPHAGE-XR) 500 MG 24 hr tablet   . montelukast (SINGULAIR) 10 MG tablet Take 10 mg by mouth at bedtime.  . ONE TOUCH ULTRA TEST test strip USE TO CHECK BLOOD GLUCOSE ONCE A DAY AS INSTRUCTED  . PARoxetine (PAXIL) 20 MG tablet Take 20 mg by mouth daily.   . potassium chloride SA (K-DUR,KLOR-CON) 20 MEQ tablet   . predniSONE (DELTASONE) 10 MG tablet Take 1 tablet (10 mg total) by mouth daily with breakfast.  . simvastatin (ZOCOR) 80 MG tablet Take 80 mg by mouth as directed. Take 1/2 tablet M,W,F  . tiotropium (SPIRIVA) 18 MCG inhalation capsule Place 18 mcg into inhaler and inhale daily.  . vitamin B-12 (CYANOCOBALAMIN) 1000 MCG tablet Take 1,000 mcg by mouth daily.  Marland Kitchen warfarin (COUMADIN) 2 MG tablet Take 1 tablet daily or as directed  . warfarin (COUMADIN) 5 MG tablet    Allergies  Allergen Reactions  . Advair Diskus [Fluticasone-Salmeterol]     Itching  . Biaxin [Clarithromycin]     N/V  . Ciprofloxacin     N/V  . Doxycycline Other (See Comments)    Inside of mouth and  tongue red and peeled  . Keflex [Cephalexin]     N/V  . Ketek [Telithromycin]     N/V  . Levaquin [Levofloxacin In D5w] Other (See Comments)    Arrythmia, polymorphic Vtach on 10/15 hospitalization  . Macrobid [Nitrofurantoin]     N/V  . Penicillins     Rash  . Sulfonamide Derivatives     PMHx:   Past Medical History  Diagnosis Date  . AV block, complete   . CHF (congestive heart failure)   . AAA (abdominal aortic aneurysm)   . Dyslipidemia   . History of colonic polyps   . Anxiety and depression   . Diverticulosis of colon   . Asthma   . Acute renal failure   . Atrial flutter   . Hyperglycemia   . UTI (lower urinary tract infection)   . COPD  (chronic obstructive pulmonary disease)   . HTN (hypertension)   . DM (diabetes mellitus)   . GERD (gastroesophageal reflux disease)   . DVT (deep venous thrombosis)   . B12 deficiency   . Angiodysplasia of intestine (without mention of hemorrhage)    Immunization History  Administered Date(s) Administered  . Pneumococcal Conjugate-13 06/25/2014  . Pneumococcal-Unspecified 08/23/2001, 03/07/2013  . Td 08/24/2003  . Zoster 08/26/2008   Past Surgical History  Procedure Laterality Date  . Tubal ligation    . Abdominal hysterectomy    . Appendectomy    . Pacemaker insertion      medtronic   . Colonoscopy  02/06/2002    161.09,604.54  . Bladder suspension     FHx:    Reviewed / unchanged  SHx:    Reviewed / unchanged  Systems Review:  Constitutional: Denies fever, chills, wt changes, headaches, insomnia, fatigue, night sweats, change in appetite. Eyes: Denies redness, blurred vision, diplopia, discharge, itchy, watery eyes.  ENT: Denies discharge, congestion, post nasal drip, epistaxis, sore throat, earache, hearing loss, dental pain, tinnitus, vertigo, sinus pain, snoring.  CV: Denies chest pain, palpitations, irregular heartbeat, syncope, dyspnea, diaphoresis, orthopnea, PND, claudication or edema. Respiratory: denies cough, dyspnea, DOE, pleurisy, hoarseness, laryngitis, wheezing.  Gastrointestinal: Denies dysphagia, odynophagia, heartburn, reflux, water brash, abdominal pain or cramps, nausea, vomiting, bloating, diarrhea, constipation, hematemesis, melena, hematochezia  or hemorrhoids. Genitourinary: Denies dysuria, frequency, urgency, nocturia, hesitancy, discharge, hematuria or flank pain. Musculoskeletal: Denies arthralgias, myalgias, stiffness, jt. swelling, pain, limping or strain/sprain.  Skin: Denies pruritus, rash, hives, warts, acne, eczema or change in skin lesion(s). Neuro: No weakness, tremor, incoordination, spasms, paresthesia or pain. Psychiatric: Denies  confusion, memory loss or sensory loss. Endo: Denies change in weight, skin or hair change.  Heme/Lymph: No excessive bleeding, bruising or enlarged lymph nodes.  Physical Exam  BP 126/54   Pulse 92  Temp 98.1 F   Resp 18  Ht    Wt 182 lb 9.6 oz     BMI 32.35   Appears well nourished and in no distress. Eyes: PERRLA, EOMs, conjunctiva no swelling or erythema. Sinuses: No frontal/maxillary tenderness ENT/Mouth: EAC's clear, TM's nl w/o erythema, bulging. Nares clear w/o erythema, swelling, exudates. Oropharynx clear without erythema or exudates. Oral hygiene is good. Tongue normal, non obstructing. Hearing intact.  Neck: Supple. Thyroid nl. Car 2+/2+ without bruits, nodes or JVD. Chest: Kyphotic.BS very distant  w/o rales, rhonchi, wheezing or stridor evident.  Cor: Heart sounds normal w/ sl.  irregular rate and rhythm without sig. murmurs. Peripheral pulses 1+ and equal with 1+ pretibial edema.  Abdomen: Soft & bowel sounds  normal. Non-tender w/o guarding, rebound, hernias, masses, or organomegaly.  Lymphatics: Unremarkable.  Musculoskeletal: Full ROM all peripheral extremities, joint stability, 5/5 strength, and normal gait.  Skin: Warm, dry without exposed rashes, lesions or ecchymosis apparent.  Neuro: Cranial nerves intact, reflexes equal bilaterally. Sensory-motor testing grossly intact. Tendon reflexes grossly intact.  Pysch: Alert & oriented x 3.  Insight and judgement nl & appropriate. No ideations.  Assessment and Plan:  1. Hypertension - Continue monitor blood pressure at home. Continue diet/meds same.  2. ASHD w/ Afib & Perm Ht Pacemaker - Patient switching Coumadin to Eliquis 2.5 mg bid.  3. Hyperlipidemia - Continue diet/meds, exercise,& lifestyle modifications. Continue monitor periodic cholesterol/liver & renal functions   4. T2_NIDDM Pre-Diabetes - Continue diet, exercise, lifestyle modifications. Monitor appropriate labs.  5. Vitamin D Deficiency -  Continue supplementation.  6. End Stage COPD -    Recommened regular exercise, BP monitoring, weight control, and discussed med and SE's. Recommended labs to assess and monitor clinical status. Further disposition pending results of labs.

## 2014-09-26 NOTE — Patient Instructions (Signed)
  Stop Coumadin / Warfarin   Start Eliquis 5 mg x 1/2 tab =- 2.5 mg 2 x day -- am & pm  ++++++++++++++++++++++++++++++++++++++++++++++++++++++  Recommend the book "The END of DIETING" by Dr Monico HoarJoel Fuhrman   & the book "The END of DIABETES " by Dr Monico HoarJoel Fuhrman  At [email or web address] - get book & Audio CD's      Being diabetic has a  300% increased risk for heart attack, stroke, cancer, and alzheimer- type vascular dementia. It is very important that you work harder with diet by avoiding all foods that are white. Avoid white rice (brown & wild rice is OK), white potatoes (sweetpotatoes in moderation is OK), White bread or wheat bread or anything made out of white flour like bagels, donuts, rolls, buns, biscuits, cakes, pastries, cookies, pizza crust, and pasta (made from white flour & egg whites) - vegetarian pasta or spinach or wheat pasta is OK. Multigrain breads like Arnold's or Pepperidge Farm, or multigrain sandwich thins or flatbreads.  Diet, exercise and weight loss can reverse and cure diabetes in the early stages.  Diet, exercise and weight loss is very important in the control and prevention of complications of diabetes which affects every system in your body, ie. Brain - dementia/stroke, eyes - glaucoma/blindness, heart - heart attack/heart failure, kidneys - dialysis, stomach - gastric paralysis, intestines - malabsorption, nerves - severe painful neuritis, circulation - gangrene & loss of a leg(s), and finally cancer and Alzheimers.    I recommend avoid fried & greasy foods,  sweets/candy, white rice (brown or wild rice or Quinoa is OK), white potatoes (sweet potatoes are OK) - anything made from white flour - bagels, doughnuts, rolls, buns, biscuits,white and wheat breads, pizza crust and traditional pasta made of white flour & egg white(vegetarian pasta or spinach or wheat pasta is OK).  Multi-grain bread is OK - like multi-grain flat bread or sandwich thins. Avoid alcohol in excess. Exercise is  also important.    Eat all the vegetables you want - avoid meat, especially red meat and dairy - especially cheese.  Cheese is the most concentrated form of trans-fats which is the worst thing to clog up our arteries. Veggie cheese is OK which can be found in the fresh produce section at [facility] or [employer] or [employer]

## 2014-09-27 LAB — BASIC METABOLIC PANEL WITH GFR
BUN: 17 mg/dL (ref 6–23)
CHLORIDE: 98 meq/L (ref 96–112)
CO2: 31 mEq/L (ref 19–32)
CREATININE: 1.25 mg/dL — AB (ref 0.50–1.10)
Calcium: 9.6 mg/dL (ref 8.4–10.5)
GFR, EST NON AFRICAN AMERICAN: 41 mL/min — AB
GFR, Est African American: 47 mL/min — ABNORMAL LOW
Glucose, Bld: 146 mg/dL — ABNORMAL HIGH (ref 70–99)
POTASSIUM: 3.9 meq/L (ref 3.5–5.3)
Sodium: 142 mEq/L (ref 135–145)

## 2014-09-27 LAB — LIPID PANEL
CHOLESTEROL: 176 mg/dL (ref 0–200)
HDL: 50 mg/dL (ref >39)
LDL Cholesterol: 80 mg/dL (ref 0–99)
TRIGLYCERIDES: 231 mg/dL — AB (ref <150)
Total CHOL/HDL Ratio: 3.5 Ratio
VLDL: 46 mg/dL — ABNORMAL HIGH (ref 0–40)

## 2014-09-27 LAB — HEPATIC FUNCTION PANEL
ALBUMIN: 3.9 g/dL (ref 3.5–5.2)
AST: 16 U/L (ref 0–37)
Alkaline Phosphatase: 56 U/L (ref 39–117)
Bilirubin, Direct: 0.2 mg/dL (ref 0.0–0.3)
Indirect Bilirubin: 0.5 mg/dL (ref 0.2–1.2)
Total Bilirubin: 0.7 mg/dL (ref 0.2–1.2)
Total Protein: 6.1 g/dL (ref 6.0–8.3)

## 2014-09-27 LAB — VITAMIN D 25 HYDROXY (VIT D DEFICIENCY, FRACTURES): Vit D, 25-Hydroxy: 73 ng/mL (ref 30–100)

## 2014-09-27 LAB — TSH: TSH: 0.917 u[IU]/mL (ref 0.350–4.500)

## 2014-09-27 LAB — HEMOGLOBIN A1C
Hgb A1c MFr Bld: 7 % — ABNORMAL HIGH (ref <5.7)
MEAN PLASMA GLUCOSE: 154 mg/dL — AB (ref <117)

## 2014-09-27 LAB — VITAMIN B12: Vitamin B-12: 981 pg/mL — ABNORMAL HIGH (ref 211–911)

## 2014-09-27 LAB — INSULIN, FASTING: INSULIN FASTING, SERUM: 18.5 u[IU]/mL (ref 2.0–19.6)

## 2014-09-27 LAB — MAGNESIUM: Magnesium: 2 mg/dL (ref 1.5–2.5)

## 2014-09-30 ENCOUNTER — Telehealth: Payer: Self-pay | Admitting: *Deleted

## 2014-09-30 NOTE — Telephone Encounter (Signed)
Pt stopped taking coumadin last Thursday and put on eliquis 2.5 mg twice daily by PCP

## 2014-10-01 ENCOUNTER — Ambulatory Visit: Payer: Self-pay | Admitting: *Deleted

## 2014-10-01 DIAGNOSIS — I480 Paroxysmal atrial fibrillation: Secondary | ICD-10-CM

## 2014-10-01 NOTE — Telephone Encounter (Signed)
Removed from coumadin clinic.  PCP to follow eliquis management.

## 2014-10-09 DIAGNOSIS — J4520 Mild intermittent asthma, uncomplicated: Secondary | ICD-10-CM | POA: Diagnosis not present

## 2014-10-09 DIAGNOSIS — J449 Chronic obstructive pulmonary disease, unspecified: Secondary | ICD-10-CM | POA: Diagnosis not present

## 2014-10-25 ENCOUNTER — Encounter: Payer: Self-pay | Admitting: Internal Medicine

## 2014-10-25 ENCOUNTER — Ambulatory Visit (INDEPENDENT_AMBULATORY_CARE_PROVIDER_SITE_OTHER): Payer: Medicare Other | Admitting: Internal Medicine

## 2014-10-25 VITALS — BP 139/67 | HR 73 | Ht 63.0 in | Wt 182.0 lb

## 2014-10-25 DIAGNOSIS — I5032 Chronic diastolic (congestive) heart failure: Secondary | ICD-10-CM

## 2014-10-25 DIAGNOSIS — J42 Unspecified chronic bronchitis: Secondary | ICD-10-CM | POA: Diagnosis not present

## 2014-10-25 DIAGNOSIS — I442 Atrioventricular block, complete: Secondary | ICD-10-CM | POA: Diagnosis not present

## 2014-10-25 DIAGNOSIS — I482 Chronic atrial fibrillation, unspecified: Secondary | ICD-10-CM

## 2014-10-25 DIAGNOSIS — R0602 Shortness of breath: Secondary | ICD-10-CM

## 2014-10-25 LAB — MDC_IDC_ENUM_SESS_TYPE_INCLINIC
Battery Remaining Longevity: 46 mo
Date Time Interrogation Session: 20160304101425
Implantable Pulse Generator Model: 8042
Lead Channel Impedance Value: 0 Ohm
Lead Channel Pacing Threshold Amplitude: 0.5 V
Lead Channel Pacing Threshold Pulse Width: 0.4 ms
Lead Channel Sensing Intrinsic Amplitude: 11.2 mV
Lead Channel Setting Pacing Amplitude: 2.5 V
MDC IDC MSMT BATTERY VOLTAGE: 2.96 V
MDC IDC MSMT LEADCHNL LV IMPEDANCE VALUE: 0 Ohm
MDC IDC MSMT LEADCHNL RV IMPEDANCE VALUE: 523 Ohm
MDC IDC SET LEADCHNL RV PACING PULSEWIDTH: 0.4 ms
MDC IDC SET LEADCHNL RV SENSING SENSITIVITY: 2.8 mV
MDC IDC STAT BRADY RV PERCENT PACED: 96 %

## 2014-10-25 NOTE — Patient Instructions (Signed)
Your physician recommends that you schedule a follow-up appointment in: [facility] Your physician recommends that you schedule a follow-up appointment in: 1 year with Dr. Johney FrameAllred. You will receive a reminder letter in the mail in about 10 months reminding you to call and schedule your appointment. If you don't receive this letter, please contact our office. Refer to Dr. Purvis SheffieldKoneswaran. Refer to Pulmonology.

## 2014-10-26 NOTE — Progress Notes (Signed)
PCP: [name] CorwinMCKEOWN,[name], MD  HPI [name] returns today for followup. She is a pleasant and 81 year old woman with a history of chronic systolic heart failure, complete heart block, hypertension, and severe COPD on home oxygen (5L). She was hospitalized 10/15 with COPD exacerbation.  On Levaquin and azithromycin, she had prolonged QT and polymorphic VT.  This resolved off of these medicines.  Her QT is stable today.  She remains frail and on home O2.  Her sats are <80% today on 5L.  She has a chronic cough and shortness of breath, but no peripheral edema. No syncope. Her left ventricular lead was turned off secondary to diaphragmatic stimulation previuosly.  She is in afib all of the time.  Allergies  Allergen Reactions  . Advair Diskus [Fluticasone-Salmeterol]     Itching  . Biaxin [Clarithromycin]     N/V  . Ciprofloxacin     N/V  . Doxycycline Other (See Comments)    Inside of mouth and tongue red and peeled  . Keflex [Cephalexin]     N/V  . Ketek [Telithromycin]     N/V  . Levaquin [Levofloxacin In D5w] Other (See Comments)    Arrythmia, polymorphic Vtach on 10/15 hospitalization  . Macrobid [Nitrofurantoin]     N/V  . Penicillins     Rash  . Sulfonamide Derivatives      Current Outpatient Prescriptions  Medication Sig Dispense Refill  . apixaban (ELIQUIS) 5 MG TABS tablet Take 1/2 to 1 tablet 2 x da as directed 60 tablet   . atorvastatin (LIPITOR) 40 MG tablet     . budesonide (PULMICORT) 0.25 MG/2ML nebulizer solution Take 2 mLs (0.25 mg total) by nebulization every 6 (six) hours. 60 mL 12  . Cholecalciferol (VITAMIN D) 2000 UNITS tablet Take 5,000 Units by mouth daily.     Marland Kitchen. dexlansoprazole (DEXILANT) 60 MG capsule Take 60 mg by mouth daily.    . furosemide (LASIX) 40 MG tablet TAKE DAILY AND AS NEEDED FOR  WEIGHT OVER 181 45 tablet 6  . levalbuterol (XOPENEX HFA) 45 MCG/ACT inhaler Take 1 - 2 puffs -  5 minutes apart  - 4 x day or every 4 hours as needed to rescue  Asthma 1 Inhaler 99  . levalbuterol (XOPENEX) 1.25 MG/3ML nebulizer solution 1 neb treatment 4 x day 360 mL 12  . linagliptin (TRADJENTA) 5 MG TABS tablet Take 1 tablet (5 mg total) by mouth daily. 30 tablet 0  . metFORMIN (GLUCOPHAGE-XR) 500 MG 24 hr tablet Take 500 mg by mouth 2 (two) times daily.     . montelukast (SINGULAIR) 10 MG tablet Take 10 mg by mouth at bedtime.    . ONE TOUCH ULTRA TEST test strip USE TO CHECK BLOOD GLUCOSE ONCE A DAY AS INSTRUCTED 100 each 99  . PARoxetine (PAXIL) 20 MG tablet Take 20 mg by mouth daily.     . potassium chloride SA (K-DUR,KLOR-CON) 20 MEQ tablet     . predniSONE (DELTASONE) 10 MG tablet Take 1 tablet (10 mg total) by mouth daily with breakfast.    . simvastatin (ZOCOR) 80 MG tablet Take 80 mg by mouth as directed. Take 1/2 tablet M,W,F    . tiotropium (SPIRIVA) 18 MCG inhalation capsule Place 18 mcg into inhaler and inhale daily.    . vitamin B-12 (CYANOCOBALAMIN) 1000 MCG tablet Take 1,000 mcg by mouth daily.     No current facility-administered medications for this visit.     Past Medical History  Diagnosis Date  .  AV block, complete   . CHF (congestive heart failure)   . AAA (abdominal aortic aneurysm)   . Dyslipidemia   . History of colonic polyps   . Anxiety and depression   . Diverticulosis of colon   . Asthma   . Acute renal failure   . Atrial flutter   . Hyperglycemia   . UTI (lower urinary tract infection)   . COPD (chronic obstructive pulmonary disease)   . HTN (hypertension)   . DM (diabetes mellitus)   . GERD (gastroesophageal reflux disease)   . DVT (deep venous thrombosis)   . B12 deficiency   . Angiodysplasia of intestine (without mention of hemorrhage)     ROS:   All systems reviewed and negative except as noted in the HPI.   Past Surgical History  Procedure Laterality Date  . Tubal ligation    . Abdominal hysterectomy    . Appendectomy    . Pacemaker insertion      medtronic   . Colonoscopy  02/06/2002     409.81,191.47  . Bladder suspension       Family History  Problem Relation Age of Onset  . Emphysema Maternal Uncle     multiple   . Asthma Daughter   . Colon cancer Sister     alive at 80   . Heart disease Father   . CVA Mother   . Heart disease Mother      History   Social History  . Marital Status: Widowed    Spouse Name: N/A  . Number of Children: N/A  . Years of Education: N/A   Occupational History  . Not on file.   Social History Main Topics  . Smoking status: Former Smoker    Start date: 11/30/1953    Quit date: 08/23/1988  . Smokeless tobacco: Never Used     Comment: quit in 1990, smoked 1 ppd x 30 years   . Alcohol Use: No  . Drug Use: No  . Sexual Activity: Not on file   Other Topics Concern  . Not on file   Social History Narrative   Married, 4 children; retired Associate Professor.      BP 139/67 mmHg  Pulse 73  Ht  (1.6 m)  Wt 182 lb (82.555 kg)  BMI 32.25 kg/m2  Physical Exam:  Stable but chronically ill appearing 81 year old woman, NAD HEENT: OP clear Neck:  7 cm JVD, no thyromegally Lungs:  Scattered rales in the bases with no wheezes HEART:  Regular rate rhythm (paced) Abd:  soft, positive bowel sounds, no organomegally, no rebound, no guarding Ext:  2 plus pulses, no edema, no cyanosis, no clubbing Skin:  No rashes no nodules Neuro:  CN II through XII intact, motor grossly intact  DEVICE  Normal device function.  See PaceArt for details. Left ventricular lead remains turned off. Underlying rhythm is atrial fib with a controlled VR.  Assess/Plan:   1. Complete heart block Normal device function, programmed VVIR with LV lead turned off due to diaphragmatic stim She has a MDT InSynch III 8042 device.  We discussed the recent MDT advisory with this device.  Given her advanced age and the fact that she is not dependant, she and her daughter are clear that they would prefer to avoid any surgery at this time.  She will therefore  be followed in the device clinic every 3 months.  She is aware that there is risk that her device could fail and that she  may require premature generator change.  2. Long QT/ PMVT Resolved off of levaquin and azithromycin  3. Chronic systolic dysfunction Stable No change required today Refer to Dr Purvis Sheffield for outpatient management  4. Chronic afib Continue long term anticoagulation and rate control She really does not have good AAD options  5. End stage COPD She is not followed by pulmonary.  I will therefore refer to pulmonary at this time.  She is clear that her wishes are DNI/DNR.  She may benefit from hospice services.  Her sats are very low today (<80%) on 5L.  She is at very high risk of decompensation/ hospitalization/ death.  Her COPD medicines are reviewed at length.  PCP's note is also reviewed today. A high level of decision making is required in her management today.  She will need to be seen in the device clinic every 3 months I will see in a year

## 2014-10-31 ENCOUNTER — Telehealth: Payer: Self-pay | Admitting: Cardiovascular Disease

## 2014-10-31 NOTE — Telephone Encounter (Signed)
[name] called stating that she does not want us to schedule an appointment with Pulmonary doctor at this time. Will notify Dr. Purvis SheffieldKoneswaran.

## 2014-11-04 ENCOUNTER — Inpatient Hospital Stay (HOSPITAL_COMMUNITY)
Admission: EM | Admit: 2014-11-04 | Discharge: 2014-11-12 | DRG: 070 | Disposition: A | Discharge status: Home Health | Payer: Medicare Other | Attending: Internal Medicine | Admitting: Internal Medicine

## 2014-11-04 ENCOUNTER — Emergency Department (HOSPITAL_COMMUNITY): Payer: Medicare Other

## 2014-11-04 ENCOUNTER — Inpatient Hospital Stay (HOSPITAL_COMMUNITY): Payer: Medicare Other

## 2014-11-04 ENCOUNTER — Encounter: Payer: Self-pay | Admitting: Internal Medicine

## 2014-11-04 ENCOUNTER — Encounter (HOSPITAL_COMMUNITY): Payer: Self-pay | Admitting: *Deleted

## 2014-11-04 DIAGNOSIS — Z888 Allergy status to other drugs, medicaments and biological substances status: Secondary | ICD-10-CM

## 2014-11-04 DIAGNOSIS — Z90710 Acquired absence of both cervix and uterus: Secondary | ICD-10-CM | POA: Diagnosis not present

## 2014-11-04 DIAGNOSIS — G9340 Encephalopathy, unspecified: Principal | ICD-10-CM | POA: Diagnosis present

## 2014-11-04 DIAGNOSIS — I5022 Chronic systolic (congestive) heart failure: Secondary | ICD-10-CM | POA: Diagnosis present

## 2014-11-04 DIAGNOSIS — I442 Atrioventricular block, complete: Secondary | ICD-10-CM | POA: Diagnosis present

## 2014-11-04 DIAGNOSIS — E119 Type 2 diabetes mellitus without complications: Secondary | ICD-10-CM | POA: Diagnosis present

## 2014-11-04 DIAGNOSIS — Z7901 Long term (current) use of anticoagulants: Secondary | ICD-10-CM

## 2014-11-04 DIAGNOSIS — R4182 Altered mental status, unspecified: Secondary | ICD-10-CM

## 2014-11-04 DIAGNOSIS — Z87891 Personal history of nicotine dependence: Secondary | ICD-10-CM | POA: Diagnosis not present

## 2014-11-04 DIAGNOSIS — Z79899 Other long term (current) drug therapy: Secondary | ICD-10-CM | POA: Diagnosis not present

## 2014-11-04 DIAGNOSIS — R0602 Shortness of breath: Secondary | ICD-10-CM | POA: Diagnosis not present

## 2014-11-04 DIAGNOSIS — I482 Chronic atrial fibrillation, unspecified: Secondary | ICD-10-CM | POA: Diagnosis present

## 2014-11-04 DIAGNOSIS — K219 Gastro-esophageal reflux disease without esophagitis: Secondary | ICD-10-CM | POA: Diagnosis present

## 2014-11-04 DIAGNOSIS — J9622 Acute and chronic respiratory failure with hypercapnia: Secondary | ICD-10-CM | POA: Diagnosis present

## 2014-11-04 DIAGNOSIS — Z9981 Dependence on supplemental oxygen: Secondary | ICD-10-CM

## 2014-11-04 DIAGNOSIS — Z66 Do not resuscitate: Secondary | ICD-10-CM | POA: Diagnosis present

## 2014-11-04 DIAGNOSIS — E872 Acidosis: Secondary | ICD-10-CM | POA: Diagnosis not present

## 2014-11-04 DIAGNOSIS — I959 Hypotension, unspecified: Secondary | ICD-10-CM | POA: Diagnosis not present

## 2014-11-04 DIAGNOSIS — J449 Chronic obstructive pulmonary disease, unspecified: Secondary | ICD-10-CM | POA: Diagnosis present

## 2014-11-04 DIAGNOSIS — E785 Hyperlipidemia, unspecified: Secondary | ICD-10-CM | POA: Diagnosis present

## 2014-11-04 DIAGNOSIS — I4891 Unspecified atrial fibrillation: Secondary | ICD-10-CM | POA: Diagnosis not present

## 2014-11-04 DIAGNOSIS — Z950 Presence of cardiac pacemaker: Secondary | ICD-10-CM | POA: Diagnosis not present

## 2014-11-04 DIAGNOSIS — Z881 Allergy status to other antibiotic agents status: Secondary | ICD-10-CM | POA: Diagnosis not present

## 2014-11-04 DIAGNOSIS — I129 Hypertensive chronic kidney disease with stage 1 through stage 4 chronic kidney disease, or unspecified chronic kidney disease: Secondary | ICD-10-CM | POA: Diagnosis not present

## 2014-11-04 DIAGNOSIS — Z7952 Long term (current) use of systemic steroids: Secondary | ICD-10-CM | POA: Diagnosis not present

## 2014-11-04 DIAGNOSIS — R509 Fever, unspecified: Secondary | ICD-10-CM

## 2014-11-04 DIAGNOSIS — R410 Disorientation, unspecified: Secondary | ICD-10-CM | POA: Diagnosis not present

## 2014-11-04 DIAGNOSIS — S199XXA Unspecified injury of neck, initial encounter: Secondary | ICD-10-CM | POA: Diagnosis not present

## 2014-11-04 DIAGNOSIS — J9621 Acute and chronic respiratory failure with hypoxia: Secondary | ICD-10-CM | POA: Diagnosis present

## 2014-11-04 DIAGNOSIS — R569 Unspecified convulsions: Secondary | ICD-10-CM | POA: Diagnosis not present

## 2014-11-04 DIAGNOSIS — Z882 Allergy status to sulfonamides status: Secondary | ICD-10-CM | POA: Diagnosis not present

## 2014-11-04 DIAGNOSIS — J45909 Unspecified asthma, uncomplicated: Secondary | ICD-10-CM | POA: Diagnosis present

## 2014-11-04 DIAGNOSIS — G4089 Other seizures: Secondary | ICD-10-CM | POA: Diagnosis present

## 2014-11-04 DIAGNOSIS — M542 Cervicalgia: Secondary | ICD-10-CM

## 2014-11-04 DIAGNOSIS — N183 Chronic kidney disease, stage 3 unspecified: Secondary | ICD-10-CM | POA: Diagnosis present

## 2014-11-04 DIAGNOSIS — N390 Urinary tract infection, site not specified: Secondary | ICD-10-CM | POA: Diagnosis not present

## 2014-11-04 DIAGNOSIS — R0682 Tachypnea, not elsewhere classified: Secondary | ICD-10-CM | POA: Diagnosis not present

## 2014-11-04 DIAGNOSIS — Z86718 Personal history of other venous thrombosis and embolism: Secondary | ICD-10-CM | POA: Diagnosis not present

## 2014-11-04 DIAGNOSIS — R0902 Hypoxemia: Secondary | ICD-10-CM

## 2014-11-04 DIAGNOSIS — N189 Chronic kidney disease, unspecified: Secondary | ICD-10-CM | POA: Diagnosis not present

## 2014-11-04 DIAGNOSIS — J984 Other disorders of lung: Secondary | ICD-10-CM | POA: Diagnosis not present

## 2014-11-04 DIAGNOSIS — J431 Panlobular emphysema: Secondary | ICD-10-CM | POA: Diagnosis not present

## 2014-11-04 DIAGNOSIS — J42 Unspecified chronic bronchitis: Secondary | ICD-10-CM

## 2014-11-04 DIAGNOSIS — J4520 Mild intermittent asthma, uncomplicated: Secondary | ICD-10-CM | POA: Diagnosis not present

## 2014-11-04 DIAGNOSIS — I82409 Acute embolism and thrombosis of unspecified deep veins of unspecified lower extremity: Secondary | ICD-10-CM

## 2014-11-04 LAB — CBC WITH DIFFERENTIAL/PLATELET
Basophils Absolute: 0 10*3/uL (ref 0.0–0.1)
Basophils Relative: 0 % (ref 0–1)
EOS ABS: 0 10*3/uL (ref 0.0–0.7)
EOS PCT: 0 % (ref 0–5)
HCT: 41 % (ref 36.0–46.0)
Hemoglobin: 12 g/dL (ref 12.0–15.0)
Lymphocytes Relative: 6 % — ABNORMAL LOW (ref 12–46)
Lymphs Abs: 0.7 10*3/uL (ref 0.7–4.0)
MCH: 27.1 pg (ref 26.0–34.0)
MCHC: 29.3 g/dL — AB (ref 30.0–36.0)
MCV: 92.8 fL (ref 78.0–100.0)
Monocytes Absolute: 0.7 10*3/uL (ref 0.1–1.0)
Monocytes Relative: 6 % (ref 3–12)
NEUTROS ABS: 10.9 10*3/uL — AB (ref 1.7–7.7)
Neutrophils Relative %: 88 % — ABNORMAL HIGH (ref 43–77)
PLATELETS: 153 10*3/uL (ref 150–400)
RBC: 4.42 MIL/uL (ref 3.87–5.11)
RDW: 14.9 % (ref 11.5–15.5)
WBC: 12.4 10*3/uL — ABNORMAL HIGH (ref 4.0–10.5)

## 2014-11-04 LAB — URINALYSIS, ROUTINE W REFLEX MICROSCOPIC
Bilirubin Urine: NEGATIVE
GLUCOSE, UA: NEGATIVE mg/dL
Ketones, ur: 15 mg/dL — AB
Nitrite: NEGATIVE
PH: 5 (ref 5.0–8.0)
PROTEIN: 30 mg/dL — AB
Specific Gravity, Urine: 1.021 (ref 1.005–1.030)
Urobilinogen, UA: 0.2 mg/dL (ref 0.0–1.0)

## 2014-11-04 LAB — I-STAT ARTERIAL BLOOD GAS, ED
ACID-BASE EXCESS: 7 mmol/L — AB (ref 0.0–2.0)
Acid-Base Excess: 6 mmol/L — ABNORMAL HIGH (ref 0.0–2.0)
Acid-Base Excess: 8 mmol/L — ABNORMAL HIGH (ref 0.0–2.0)
BICARBONATE: 33.6 meq/L — AB (ref 20.0–24.0)
Bicarbonate: 33.3 mEq/L — ABNORMAL HIGH (ref 20.0–24.0)
Bicarbonate: 35 mEq/L — ABNORMAL HIGH (ref 20.0–24.0)
O2 Saturation: 82 %
O2 Saturation: 90 %
O2 Saturation: 100 %
PCO2 ART: 59.3 mmHg — AB (ref 35.0–45.0)
PH ART: 7.349 — AB (ref 7.350–7.450)
TCO2: 35 mmol/L (ref 0–100)
TCO2: 35 mmol/L (ref 0–100)
TCO2: 37 mmol/L (ref 0–100)
pCO2 arterial: 60.5 mmHg (ref 35.0–45.0)
pCO2 arterial: 58.6 mmHg (ref 35.0–45.0)
pH, Arterial: 7.366 (ref 7.350–7.450)
pH, Arterial: 7.379 (ref 7.350–7.450)
pO2, Arterial: 50 mmHg — ABNORMAL LOW (ref 80.0–100.0)
pO2, Arterial: 62 mmHg — ABNORMAL LOW (ref 80.0–100.0)
pO2, Arterial: 237 mmHg — ABNORMAL HIGH (ref 80.0–100.0)

## 2014-11-04 LAB — COMPREHENSIVE METABOLIC PANEL
ALBUMIN: 3.7 g/dL (ref 3.5–5.2)
ALK PHOS: 69 U/L (ref 39–117)
ALT: 9 U/L (ref 0–35)
AST: 23 U/L (ref 0–37)
Anion gap: 10 (ref 5–15)
BILIRUBIN TOTAL: 1.2 mg/dL (ref 0.3–1.2)
BUN: 19 mg/dL (ref 6–23)
CHLORIDE: 95 mmol/L — AB (ref 96–112)
CO2: 33 mmol/L — AB (ref 19–32)
Calcium: 9.3 mg/dL (ref 8.4–10.5)
Creatinine, Ser: 1.52 mg/dL — ABNORMAL HIGH (ref 0.50–1.10)
GFR calc Af Amer: 36 mL/min — ABNORMAL LOW (ref >90)
GFR, EST NON AFRICAN AMERICAN: 31 mL/min — AB (ref >90)
Glucose, Bld: 200 mg/dL — ABNORMAL HIGH (ref 70–99)
Potassium: 3.9 mmol/L (ref 3.5–5.1)
Sodium: 138 mmol/L (ref 135–145)
Total Protein: 6.6 g/dL (ref 6.0–8.3)

## 2014-11-04 LAB — URINE MICROSCOPIC-ADD ON

## 2014-11-04 LAB — I-STAT CG4 LACTIC ACID, ED
LACTIC ACID, VENOUS: 1.77 mmol/L (ref 0.5–2.0)
LACTIC ACID, VENOUS: 1.88 mmol/L (ref 0.5–2.0)

## 2014-11-04 LAB — I-STAT TROPONIN, ED: Troponin i, poc: 0 ng/mL (ref 0.00–0.08)

## 2014-11-04 LAB — PROTIME-INR
INR: 1.27 (ref 0.00–1.49)
Prothrombin Time: 16.1 seconds — ABNORMAL HIGH (ref 11.6–15.2)

## 2014-11-04 LAB — CBG MONITORING, ED: Glucose-Capillary: 170 mg/dL — ABNORMAL HIGH (ref 70–99)

## 2014-11-04 MED ORDER — ONDANSETRON HCL 4 MG/2ML IJ SOLN
4.0000 mg | Freq: Once | INTRAMUSCULAR | Status: AC
  Administered 2014-11-04: 4 mg via INTRAVENOUS
  Filled 2014-11-04: qty 2

## 2014-11-04 MED ORDER — BUDESONIDE 0.25 MG/2ML IN SUSP
0.2500 mg | Freq: Two times a day (BID) | RESPIRATORY_TRACT | Status: DC
  Administered 2014-11-05 – 2014-11-12 (×16): 0.25 mg via RESPIRATORY_TRACT
  Filled 2014-11-04 (×18): qty 2

## 2014-11-04 MED ORDER — PANTOPRAZOLE SODIUM 40 MG PO TBEC
40.0000 mg | DELAYED_RELEASE_TABLET | Freq: Every day | ORAL | Status: DC
  Administered 2014-11-05 – 2014-11-12 (×8): 40 mg via ORAL
  Filled 2014-11-04 (×7): qty 1

## 2014-11-04 MED ORDER — SODIUM CHLORIDE 0.9 % IJ SOLN
3.0000 mL | Freq: Two times a day (BID) | INTRAMUSCULAR | Status: DC
  Administered 2014-11-05 – 2014-11-12 (×14): 3 mL via INTRAVENOUS

## 2014-11-04 MED ORDER — ACETAMINOPHEN 325 MG PO TABS
650.0000 mg | ORAL_TABLET | Freq: Four times a day (QID) | ORAL | Status: DC | PRN
  Administered 2014-11-05: 650 mg via ORAL
  Filled 2014-11-04: qty 2

## 2014-11-04 MED ORDER — PAROXETINE HCL 10 MG PO TABS
10.0000 mg | ORAL_TABLET | Freq: Every day | ORAL | Status: DC
  Administered 2014-11-05 – 2014-11-12 (×8): 10 mg via ORAL
  Filled 2014-11-04 (×8): qty 1

## 2014-11-04 MED ORDER — ONDANSETRON HCL 4 MG/2ML IJ SOLN: 4.0000 mg | Freq: Four times a day (QID) | INTRAMUSCULAR | Status: DC | PRN

## 2014-11-04 MED ORDER — LINAGLIPTIN 5 MG PO TABS
5.0000 mg | ORAL_TABLET | Freq: Every day | ORAL | Status: DC
  Administered 2014-11-06 – 2014-11-12 (×7): 5 mg via ORAL
  Filled 2014-11-04 (×8): qty 1

## 2014-11-04 MED ORDER — VANCOMYCIN HCL 10 G IV SOLR: 20.0000 mg/kg | Freq: Once | INTRAVENOUS | Status: DC

## 2014-11-04 MED ORDER — DEXTROSE 5 % IV SOLN
2.0000 g | Freq: Once | INTRAVENOUS | Status: AC
  Administered 2014-11-04: 2 g via INTRAVENOUS
  Filled 2014-11-04: qty 2

## 2014-11-04 MED ORDER — IPRATROPIUM-ALBUTEROL 0.5-2.5 (3) MG/3ML IN SOLN
3.0000 mL | Freq: Once | RESPIRATORY_TRACT | Status: AC
  Administered 2014-11-04: 3 mL via RESPIRATORY_TRACT
  Filled 2014-11-04: qty 3

## 2014-11-04 MED ORDER — IPRATROPIUM BROMIDE 0.02 % IN SOLN
0.5000 mg | Freq: Four times a day (QID) | RESPIRATORY_TRACT | Status: DC
  Administered 2014-11-05 – 2014-11-06 (×7): 0.5 mg via RESPIRATORY_TRACT
  Filled 2014-11-04 (×7): qty 2.5

## 2014-11-04 MED ORDER — INSULIN ASPART 100 UNIT/ML ~~LOC~~ SOLN
0.0000 [IU] | SUBCUTANEOUS | Status: DC
  Administered 2014-11-05: 5 [IU] via SUBCUTANEOUS
  Administered 2014-11-05: 3 [IU] via SUBCUTANEOUS
  Administered 2014-11-05: 2 [IU] via SUBCUTANEOUS
  Administered 2014-11-05: 1 [IU] via SUBCUTANEOUS
  Administered 2014-11-06: 2 [IU] via SUBCUTANEOUS
  Administered 2014-11-06: 3 [IU] via SUBCUTANEOUS
  Administered 2014-11-06: 2 [IU] via SUBCUTANEOUS
  Administered 2014-11-06: 1 [IU] via SUBCUTANEOUS
  Administered 2014-11-06: 2 [IU] via SUBCUTANEOUS

## 2014-11-04 MED ORDER — LEVALBUTEROL HCL 0.63 MG/3ML IN NEBU
0.6300 mg | INHALATION_SOLUTION | Freq: Four times a day (QID) | RESPIRATORY_TRACT | Status: DC
  Administered 2014-11-05 – 2014-11-06 (×7): 0.63 mg via RESPIRATORY_TRACT
  Filled 2014-11-04 (×14): qty 3

## 2014-11-04 MED ORDER — ONDANSETRON HCL 4 MG PO TABS: 4.0000 mg | ORAL_TABLET | Freq: Four times a day (QID) | ORAL | Status: DC | PRN

## 2014-11-04 MED ORDER — LEVALBUTEROL HCL 0.63 MG/3ML IN NEBU: 0.6300 mg | INHALATION_SOLUTION | Freq: Four times a day (QID) | RESPIRATORY_TRACT | Status: DC | PRN

## 2014-11-04 MED ORDER — FUROSEMIDE 10 MG/ML IJ SOLN
40.0000 mg | Freq: Every day | INTRAMUSCULAR | Status: DC
  Administered 2014-11-05: 40 mg via INTRAVENOUS
  Filled 2014-11-04: qty 4

## 2014-11-04 MED ORDER — ATORVASTATIN CALCIUM 40 MG PO TABS
40.0000 mg | ORAL_TABLET | Freq: Every day | ORAL | Status: DC
  Administered 2014-11-05 – 2014-11-12 (×8): 40 mg via ORAL
  Filled 2014-11-04 (×8): qty 1

## 2014-11-04 MED ORDER — SODIUM CHLORIDE 0.9 % IV SOLN
1250.0000 mg | Freq: Once | INTRAVENOUS | Status: AC
  Administered 2014-11-04: 1250 mg via INTRAVENOUS
  Filled 2014-11-04: qty 1250

## 2014-11-04 MED ORDER — ACETAMINOPHEN 650 MG RE SUPP: 650.0000 mg | Freq: Four times a day (QID) | RECTAL | Status: DC | PRN

## 2014-11-04 MED ORDER — ALBUTEROL (5 MG/ML) CONTINUOUS INHALATION SOLN
10.0000 mg/h | INHALATION_SOLUTION | Freq: Once | RESPIRATORY_TRACT | Status: AC
  Administered 2014-11-04: 10 mg/h via RESPIRATORY_TRACT
  Filled 2014-11-04: qty 20

## 2014-11-04 MED ORDER — MONTELUKAST SODIUM 10 MG PO TABS
10.0000 mg | ORAL_TABLET | Freq: Every day | ORAL | Status: DC
  Administered 2014-11-05 – 2014-11-11 (×8): 10 mg via ORAL
  Filled 2014-11-04 (×9): qty 1

## 2014-11-04 MED ORDER — METHYLPREDNISOLONE SODIUM SUCC 40 MG IJ SOLR
40.0000 mg | Freq: Every day | INTRAMUSCULAR | Status: DC
Start: 2014-11-05 — End: 2014-11-08
  Administered 2014-11-05 – 2014-11-08 (×4): 40 mg via INTRAVENOUS
  Filled 2014-11-04 (×4): qty 1

## 2014-11-04 NOTE — Progress Notes (Signed)
Patient was placed on 55% venti mask Per ABG and MD and was unable to maintain sats above 84%. Placed patient on 100%NRB and she currently has sats of 93%. Will continue to monitor.

## 2014-11-04 NOTE — ED Provider Notes (Signed)
CSN: [id number]     Arrival date & time 11/04/14  1604 History   First MD Initiated Contact with Patient 11/04/14 1610     Chief Complaint  Patient presents with  . Altered Mental Status     (Consider location/radiation/quality/duration/timing/severity/associated sxs/prior Treatment) Patient is a 81 y.o. female presenting with altered mental status. The history is provided by a relative. The history is limited by the condition of the patient.  Altered Mental Status Presenting symptoms: behavior changes, confusion, disorientation and partial responsiveness   Severity:  Moderate Most recent episode:  Today Episode history:  Single Timing:  Constant Progression:  Unchanged Chronicity:  New Context: not alcohol use, not dementia, not drug use, not head injury, not homeless, taking medications as prescribed, not a recent change in medication and not a recent illness   Associated symptoms: fever and weakness   Fever:    Timing:  Unable to specify   Temp source:  Oral   Progression:  Unchanged   Past Medical History  Diagnosis Date  . AV block, complete   . CHF (congestive heart failure)   . AAA (abdominal aortic aneurysm)   . Dyslipidemia   . History of colonic polyps   . Anxiety and depression   . Diverticulosis of colon   . Asthma   . Acute renal failure   . Atrial flutter   . Hyperglycemia   . UTI (lower urinary tract infection)   . COPD (chronic obstructive pulmonary disease)   . HTN (hypertension)   . DM (diabetes mellitus)   . GERD (gastroesophageal reflux disease)   . DVT (deep venous thrombosis)   . B12 deficiency   . Angiodysplasia of intestine (without mention of hemorrhage)    Past Surgical History  Procedure Laterality Date  . Tubal ligation    . Abdominal hysterectomy    . Appendectomy    . Pacemaker insertion      medtronic   . Colonoscopy  02/06/2002    147.82,[phone],562.10  . Bladder suspension     Family History  Problem Relation Age of Onset  .  Emphysema Maternal Uncle     multiple   . Asthma Daughter   . Colon cancer Sister     alive at 5980   . Heart disease Father   . CVA Mother   . Heart disease Mother    History  Substance Use Topics  . Smoking status: Former Smoker    Start date: 11/30/1953    Quit date: 08/23/1988  . Smokeless tobacco: Never Used     Comment: quit in 1990, smoked 1 ppd x 30 years   . Alcohol Use: No   OB History    No data available     Review of Systems  Unable to perform ROS: Mental status change  Constitutional: Positive for fever.  Neurological: Positive for weakness.  Psychiatric/Behavioral: Positive for confusion.      Allergies  Advair diskus; Biaxin; Ciprofloxacin; Doxycycline; Keflex; Ketek; Levaquin; Macrobid; Penicillins; and Sulfonamide derivatives  Home Medications   Prior to Admission medications   Medication Sig Start Date End Date Taking? Authorizing Provider  apixaban (ELIQUIS) 5 MG TABS tablet Take 1/2 to 1 tablet 2 x da as directed 09/26/14 09/27/15  Lucky CowboyWilliam McKeown, MD  atorvastatin (LIPITOR) 40 MG tablet  06/04/14   Historical Provider, MD  budesonide (PULMICORT) 0.25 MG/2ML nebulizer solution Take 2 mLs (0.25 mg total) by nebulization every 6 (six) hours. 06/04/14   Maryann Mikhail, DO  Cholecalciferol (VITAMIN D)  2000 UNITS tablet Take 5,000 Units by mouth daily.     Historical Provider, MD  dexlansoprazole (DEXILANT) 60 MG capsule Take 60 mg by mouth daily.    Historical Provider, MD  furosemide (LASIX) 40 MG tablet TAKE DAILY AND AS NEEDED FOR  WEIGHT OVER 181 08/05/14   Marinus Maw, MD  levalbuterol [facility] HFA) 45 MCG/ACT inhaler Take 1 - 2 puffs -  5 minutes apart  - 4 x day or every 4 hours as needed to rescue Asthma 08/09/14 08/10/15  Lucky Cowboy, MD  levalbuterol Pauline Aus) 1.25 MG/3ML nebulizer solution 1 neb treatment 4 x day 08/09/14 08/10/15  Lucky Cowboy, MD  linagliptin (TRADJENTA) 5 MG TABS tablet Take 1 tablet (5 mg total) by mouth daily.  06/07/14   Russella Dar, NP  metFORMIN (GLUCOPHAGE-XR) 500 MG 24 hr tablet Take 500 mg by mouth 2 (two) times daily.  05/31/14   Historical Provider, MD  montelukast (SINGULAIR) 10 MG tablet Take 10 mg by mouth at bedtime.    Historical Provider, MD  ONE TOUCH ULTRA TEST test strip USE TO CHECK BLOOD GLUCOSE ONCE A DAY AS INSTRUCTED 05/19/14   Lucky Cowboy, MD  PARoxetine (PAXIL) 20 MG tablet Take 20 mg by mouth daily.  10/19/13   Historical Provider, MD  potassium chloride SA (K-DUR,KLOR-CON) 20 MEQ tablet  05/04/14   Historical Provider, MD  predniSONE (DELTASONE) 10 MG tablet Take 1 tablet (10 mg total) by mouth daily with breakfast. 06/08/14   Russella Dar, NP  simvastatin (ZOCOR) 80 MG tablet Take 80 mg by mouth as directed. Take 1/2 tablet M,W,F    Historical Provider, MD  tiotropium (SPIRIVA) 18 MCG inhalation capsule Place 18 mcg into inhaler and inhale daily.    Historical Provider, MD  vitamin B-12 (CYANOCOBALAMIN) 1000 MCG tablet Take 1,000 mcg by mouth daily.    Historical Provider, MD   BP 136/63 mmHg  Pulse 69  Temp(Src) 101.7 F (38.7 C) (Rectal)  Resp 20  SpO2 99% Physical Exam  Constitutional: No distress.  HENT:  Head: Normocephalic and atraumatic.  Eyes: EOM are normal. Pupils are equal, round, and reactive to light.  Neck: Normal range of motion. Neck supple.  Cardiovascular: Normal rate and intact distal pulses.   Pulmonary/Chest: She is in respiratory distress.  Tachypnia, increased WOB.  Decreased breath sounds bilaterally  Abdominal: Soft. There is no tenderness. There is no rebound and no guarding.  Musculoskeletal: Normal range of motion.  Neurological: She has normal strength. She is disoriented. No cranial nerve deficit or sensory deficit.  Encephalopathic.  Lethargic.  Moving all four extremities.  Able to answer simple questions but unable to provide much history.  Skin: No rash noted. She is not diaphoretic.  Psychiatric: She has a normal mood and  affect.    ED Course  Procedures (including critical care time) Labs Review Labs Reviewed  CBC WITH DIFFERENTIAL/PLATELET - Abnormal; Notable for the following:    WBC 12.4 (*)    MCHC 29.3 (*)    Neutrophils Relative % 88 (*)    Neutro Abs 10.9 (*)    Lymphocytes Relative 6 (*)    All other components within normal limits  COMPREHENSIVE METABOLIC PANEL - Abnormal; Notable for the following:    Chloride 95 (*)    CO2 33 (*)    Glucose, Bld 200 (*)    Creatinine, Ser 1.52 (*)    GFR calc non Af Amer 31 (*)    GFR calc Af  Amer 36 (*)    All other components within normal limits  PROTIME-INR - Abnormal; Notable for the following:    Prothrombin Time 16.1 (*)    All other components within normal limits  CBG MONITORING, ED - Abnormal; Notable for the following:    Glucose-Capillary 170 (*)    All other components within normal limits  I-STAT ARTERIAL BLOOD GAS, ED - Abnormal; Notable for the following:    pH, Arterial 7.349 (*)    pCO2 arterial 60.5 (*)    pO2, Arterial 50.0 (*)    Bicarbonate 33.3 (*)    Acid-Base Excess 6.0 (*)    All other components within normal limits  I-STAT ARTERIAL BLOOD GAS, ED - Abnormal; Notable for the following:    pCO2 arterial 58.6 (*)    pO2, Arterial 62.0 (*)    Bicarbonate 33.6 (*)    Acid-Base Excess 7.0 (*)    All other components within normal limits  CULTURE, BLOOD (ROUTINE X 2)  CULTURE, BLOOD (ROUTINE X 2)  URINE CULTURE  URINALYSIS, ROUTINE W REFLEX MICROSCOPIC  BLOOD GAS, ARTERIAL  INFLUENZA PANEL BY PCR (TYPE A & B, H1N1)  I-STAT CG4 LACTIC ACID, ED  I-STAT TROPOININ, ED  I-STAT CG4 LACTIC ACID, ED    Imaging Review Dg Chest Portable 1 View  11/04/2014   CLINICAL DATA:  Altered mental status  EXAM: PORTABLE CHEST - 1 VIEW  COMPARISON:  June 05, 2014  FINDINGS: There is scarring in the left mid and lower lung zones. There is no edema or consolidation. Heart is borderline enlarged with pulmonary vascularity within normal  limits. Pacemaker leads are attached to the right atrium and right ventricle. There is atherosclerotic change in aorta. No pneumothorax. No adenopathy. No bone lesions.  IMPRESSION: Areas of scarring on the left. No edema or consolidation. Cardiac prominence is stable.   Electronically Signed   By: Bretta Bang III M.D.   On: 11/04/2014 16:47     EKG Interpretation   Date/Time:  Monday November 04 2014 16:16:00 EDT Ventricular Rate:  72 PR Interval:    QRS Duration: 137 QT Interval:  415 QTC Calculation: 454 R Axis:   -102 Text Interpretation:  Atrial fibrillation Multiple ventricular premature  complexes Right bundle branch block Inferior infarct, old Probable  anterolateral infarct, age indeterm No significant change was found  Artifact Confirmed by CAMPOS  MD, Caryn Bee ([id number]) on 11/04/2014 8:35:25 PM      MDM   Final diagnoses:  None    81 y/o female w/ PMH COPD w/ 5L home O2 requirement, CHF, HTN, who comes in with fever and altered mental status.  Daughter says she was found at home, altered, no known infectious symptoms.  When she was found, her oxygen was off of her (usually on 5L).  EMS was called, she was hypoxic en route to the hospital.  On arrival, satting in the 60's when I was in the room, increased WOB, tachypnea.  She is febrile with a good blood pressure.  I placed her on non-rebreather and obtained a blood gas which showed resp acidosis.  Then put her on bipap.  Mental status improved somewhat on bipap.  Patient is compaining of no headache, no neck stiffness, no abdominal pain.  She has no nuchal rigidity on exam.    Concern for sepsis.  Will obtain cbc/bmp/lactic acid/cxr/ua/bcx/ucx.  Will cover w/ vanc/cefepime  Will hold off on fluids at this time as she has a very good blood pressure and  a hx of CHF w/ increased WOB at present.  Had code discussion with patients daughter.  The patient's wishes are to be DNR/DNI  CXR resulted w/o infection, UA clean,  cbc/bmp unremarkable.  Will obtain influenza.  Patient admitted to hospitalist service in step down.    Silas Flood, MD 11/05/14 8119  Azalia Bilis, MD 11/06/14 [phone]

## 2014-11-04 NOTE — Progress Notes (Signed)
Per MD patient placed back on BIPAP.

## 2014-11-04 NOTE — ED Notes (Signed)
Pt in from home via [facility], per report pt found on the floor by PD, per report pts daughter called & could not reach her on the phone, PD found pt without O2 on living room floor, unknown last seen normal, A&O x4, CBG 150, pt uses O2 5L [place], denies CP, n/v/d

## 2014-11-04 NOTE — Progress Notes (Signed)
ANTICOAGULATION/ANTI-INFECTIVE CONSULT NOTE - Initial Consult  Pharmacy Consult for heparin and acyclovir/vancomycin/ceftriaxone Indication: atrial fibrillation and r/o meningitis  Allergies  Allergen Reactions  . Advair Diskus [Fluticasone-Salmeterol]     Itching  . Biaxin [Clarithromycin]     N/V  . Ciprofloxacin     N/V  . Doxycycline Other (See Comments)    Inside of mouth and tongue red and peeled  . Keflex [Cephalexin]     N/V  . Ketek [Telithromycin]     N/V  . Levaquin [Levofloxacin In D5w] Other (See Comments)    Arrythmia, polymorphic Vtach on 10/15 hospitalization  . Macrobid [Nitrofurantoin]     N/V  . Penicillins     Rash  . Sulfonamide Derivatives     Patient Measurements: Height:  (160 cm) Weight: 173 lb 6.4 oz (78.654 kg) IBW/kg (Calculated) : 52.4 Heparin Dosing Weight: 70kg  Vital Signs: Temp: 101.6 F (38.7 C) (03/14 2334) Temp Source: Axillary (03/14 2334) BP: 97/61 mmHg (03/14 2334) Pulse Rate: 76 (03/14 2334)  Labs:  Recent Labs  11/04/14 1630 11/04/14 1800  HGB 12.0  --   HCT 41.0  --   PLT 153  --   LABPROT  --  16.1*  INR  --  1.27  CREATININE 1.52*  --     Estimated Creatinine Clearance: 29.3 mL/min (by C-G formula based on Cr of 1.52).   Medical History: Past Medical History  Diagnosis Date  . AV block, complete   . CHF (congestive heart failure)   . AAA (abdominal aortic aneurysm)   . Dyslipidemia   . History of colonic polyps   . Anxiety and depression   . Diverticulosis of colon   . Asthma   . Acute renal failure   . Atrial flutter   . Hyperglycemia   . UTI (lower urinary tract infection)   . COPD (chronic obstructive pulmonary disease)   . HTN (hypertension)   . DM (diabetes mellitus)   . GERD (gastroesophageal reflux disease)   . DVT (deep venous thrombosis)   . B12 deficiency   . Angiodysplasia of intestine (without mention of hemorrhage)     Medications:  Prescriptions prior to admission   Medication Sig Dispense Refill Last Dose  . apixaban (ELIQUIS) 5 MG TABS tablet Take 1/2 to 1 tablet 2 x da as directed (Patient taking differently: Take 2.5 mg by mouth 2 (two) times daily. ) 60 tablet  11/03/2014 at Unknown time  . budesonide (PULMICORT) 0.25 MG/2ML nebulizer solution Take 2 mLs (0.25 mg total) by nebulization every 6 (six) hours. 60 mL 12 11/03/2014 at Unknown time  . Cholecalciferol (VITAMIN D) 2000 UNITS tablet Take 5,000 Units by mouth daily.    11/03/2014 at Unknown time  . dexlansoprazole (DEXILANT) 60 MG capsule Take 60 mg by mouth daily.   11/03/2014 at Unknown time  . furosemide (LASIX) 40 MG tablet TAKE DAILY AND AS NEEDED FOR  WEIGHT OVER 181 45 tablet 6 11/03/2014 at Unknown time  . levalbuterol (XOPENEX HFA) 45 MCG/ACT inhaler Take 1 - 2 puffs -  5 minutes apart  - 4 x day or every 4 hours as needed to rescue Asthma 1 Inhaler 99 11/03/2014 at Unknown time  . levalbuterol (XOPENEX) 1.25 MG/3ML nebulizer solution 1 neb treatment 4 x day 360 mL 12 11/03/2014 at Unknown time  . linagliptin (TRADJENTA) 5 MG TABS tablet Take 1 tablet (5 mg total) by mouth daily. 30 tablet 0 11/03/2014 at Unknown time  . metFORMIN (GLUCOPHAGE-XR)  500 MG 24 hr tablet Take 500 mg by mouth every evening.    11/03/2014 at Unknown time  . montelukast (SINGULAIR) 10 MG tablet Take 10 mg by mouth at bedtime.   11/03/2014 at Unknown time  . ONE TOUCH ULTRA TEST test strip USE TO CHECK BLOOD GLUCOSE ONCE A DAY AS INSTRUCTED 100 each 99 11/03/2014 at Unknown time  . PARoxetine (PAXIL) 20 MG tablet Take 10 mg by mouth daily.    11/04/2014 at Unknown time  . potassium chloride SA (K-DUR,KLOR-CON) 20 MEQ tablet    11/04/2014 at Unknown time  . predniSONE (DELTASONE) 10 MG tablet Take 1 tablet (10 mg total) by mouth daily with breakfast.   11/03/2014 at Unknown time  . simvastatin (ZOCOR) 80 MG tablet Take 80 mg by mouth as directed. Take 1/2 tablet M,W,F   Past Week at Unknown time  . simvastatin (ZOCOR) 80 MG tablet  Take 40 mg by mouth every Monday, Wednesday, and Friday.   11/03/2014 at Unknown time  . tiotropium (SPIRIVA) 18 MCG inhalation capsule Place 18 mcg into inhaler and inhale daily.   11/03/2014 at Unknown time  . vitamin B-12 (CYANOCOBALAMIN) 1000 MCG tablet Take 1,000 mcg by mouth daily.   11/03/2014 at Unknown time  . atorvastatin (LIPITOR) 40 MG tablet    Taking   Scheduled:  . [START ON 11/05/2014] atorvastatin  40 mg Oral q1800  . budesonide (PULMICORT) nebulizer solution  0.25 mg Nebulization BID  . [START ON 11/05/2014] furosemide  40 mg Intravenous Daily  . [START ON 11/05/2014] insulin aspart  0-9 Units Subcutaneous Q4H  . [START ON 11/05/2014] ipratropium  0.5 mg Nebulization Q6H  . [START ON 11/05/2014] levalbuterol  0.63 mg Nebulization Q6H  . [START ON 11/05/2014] linagliptin  5 mg Oral Daily  . [START ON 11/05/2014] methylPREDNISolone (SOLU-MEDROL) injection  40 mg Intravenous Daily  . montelukast  10 mg Oral QHS  . [START ON 11/05/2014] pantoprazole  40 mg Oral Daily  . [START ON 11/05/2014] PARoxetine  10 mg Oral Daily  . sodium chloride  3 mL Intravenous Q12H    Assessment: 81yo female found on floor by PD when daughter couldn't reach her by phone, UA abnormal, troponin negative, SCr up from baseline, acidotic, leukocytosis w/ unclear etiology, to begin IV ABX for possible meningitis; also to hold Eliquis and bridge w/ heparin for Afib/flutter (given age/renal function, Eliquis will affect heparin levels for a few days and will need to monitor aPTT, last dose 3/13).  Goal of Therapy:  Heparin level 0.3-0.7 units/ml aPTT 66-102 seconds Vancomycin trough 15-20 Monitor platelets by anticoagulation protocol: Yes   Plan:  Rec'd cefepime 2g and vanc 1250mg  IV in ED; will begin vancomycin 750mg  IV Q24H, Rocephin 2g IV Q12H, and acyclovir 600mg  IV Q24H and monitor CBC, Cx, levels prn.  Will start heparin gtt at 1000 units/hr and monitor heparin levels, aPTT, and CBC and f/u restart of  Eliquis.  Vernard GamblesVeronda Nemiah Kissner, PharmD, BCPS  11/04/2014,11:45 PM

## 2014-11-04 NOTE — ED Notes (Signed)
Pt remains monitored by blood pressure, pulse ox, and 12 lead. pts family remains at bedside.  

## 2014-11-04 NOTE — ED Notes (Signed)
Inserted foley catheter with assistance from [place], Charity fundraiserN. Pt remains monitored by blood pressure, pulse ox, and 12 lead. Pts daughter remains at bedside.

## 2014-11-04 NOTE — ED Notes (Signed)
Pt remains monitored by blood pressure, pulse ox, and 12 lead. pts daughter remains at bedside.

## 2014-11-04 NOTE — ED Notes (Signed)
Pt placed into gown and on monitor upon arrival to room. Pt monitored by blood pressure, pulse ox, and 12 lead. pts daughter at bedside.

## 2014-11-04 NOTE — H&P (Signed)
[facility] History and Physical  [name] ZOX:[id number] DOB: 1932-06-21 DOA: 11/04/2014  Referring physician: ER physician. PCP: Nadean Corwin, MD  Chief Complaint: Confusion and loss of consciousness.  HPI: [name] is a 81 y.o. female with history of severe COPD on 5 L home oxygen, chronic atrial fibrillation, complete heart block status post pacemaker placement, chronic systolic heart failure with last year measured was 45% in October 2015, diabetes mellitus type 2, hyperlipidemia was brought to the ER after patient was found unconscious in her house by patient's family. As per patient daughter who provided the history patient was found to flow at around 2 PM when patient's family released home to check on the patient. Within a few minutes patient regained consciousness but not confused. Patient was brought to the ER. Patient was found to be febrile. Patient was able to give answers but was lethargic. ABG shows mild respiratory acidosis and patient was placed on BiPAP. CT of the head did not show anything acute. Patient had blood cultures drawn and was started on empiric antibiotics. On exam patient is following commands and answers questions but gets very sleepy. Patient states she has had a new oxygen tank cord which is lengthy and was not sure if she had tripped and fell on it.  Review of Systems: As presented in the history of presenting illness, rest negative.  Past Medical History  Diagnosis Date  . AV block, complete   . CHF (congestive heart failure)   . AAA (abdominal aortic aneurysm)   . Dyslipidemia   . History of colonic polyps   . Anxiety and depression   . Diverticulosis of colon   . Asthma   . Acute renal failure   . Atrial flutter   . Hyperglycemia   . UTI (lower urinary tract infection)   . COPD (chronic obstructive pulmonary disease)   . HTN (hypertension)   . DM (diabetes mellitus)   . GERD (gastroesophageal reflux disease)   . DVT  (deep venous thrombosis)   . B12 deficiency   . Angiodysplasia of intestine (without mention of hemorrhage)    Past Surgical History  Procedure Laterality Date  . Tubal ligation    . Abdominal hysterectomy    . Appendectomy    . Pacemaker insertion      medtronic   . Colonoscopy  02/06/2002    811.91,478.29  . Bladder suspension     Social History:  reports that she quit smoking about 26 years ago. She started smoking about 60 years ago. She has never used smokeless tobacco. She reports that she does not drink alcohol or use illicit drugs. Where does patient live home. Can patient participate in ADLs? Yes.  Allergies  Allergen Reactions  . Advair Diskus [Fluticasone-Salmeterol]     Itching  . Biaxin [Clarithromycin]     N/V  . Ciprofloxacin     N/V  . Doxycycline Other (See Comments)    Inside of mouth and tongue red and peeled  . Keflex [Cephalexin]     N/V  . Ketek [Telithromycin]     N/V  . Levaquin [Levofloxacin In D5w] Other (See Comments)    Arrythmia, polymorphic Vtach on 10/15 hospitalization  . Macrobid [Nitrofurantoin]     N/V  . Penicillins     Rash  . Sulfonamide Derivatives     Family History:  Family History  Problem Relation Age of Onset  . Emphysema Maternal Uncle     multiple   . Asthma Daughter   .  Colon cancer Sister     alive at 30   . Heart disease Father   . CVA Mother   . Heart disease Mother       Prior to Admission medications   Medication Sig Start Date End Date Taking? Authorizing Provider  apixaban (ELIQUIS) 5 MG TABS tablet Take 1/2 to 1 tablet 2 x da as directed Patient taking differently: Take 2.5 mg by mouth 2 (two) times daily.  09/26/14 09/27/15 Yes Lucky Cowboy, MD  budesonide (PULMICORT) 0.25 MG/2ML nebulizer solution Take 2 mLs (0.25 mg total) by nebulization every 6 (six) hours. 06/04/14  Yes Maryann Mikhail, DO  Cholecalciferol (VITAMIN D) 2000 UNITS tablet Take 5,000 Units by mouth daily.    Yes Historical Provider, MD   dexlansoprazole (DEXILANT) 60 MG capsule Take 60 mg by mouth daily.   Yes Historical Provider, MD  furosemide (LASIX) 40 MG tablet TAKE DAILY AND AS NEEDED FOR  WEIGHT OVER 181 08/05/14  Yes Marinus Maw, MD  levalbuterol [facility] HFA) 45 MCG/ACT inhaler Take 1 - 2 puffs -  5 minutes apart  - 4 x day or every 4 hours as needed to rescue Asthma 08/09/14 08/10/15 Yes Lucky Cowboy, MD  levalbuterol Pauline Aus) 1.25 MG/3ML nebulizer solution 1 neb treatment 4 x day 08/09/14 08/10/15 Yes Lucky Cowboy, MD  linagliptin (TRADJENTA) 5 MG TABS tablet Take 1 tablet (5 mg total) by mouth daily. 06/07/14  Yes Russella Dar, NP  metFORMIN (GLUCOPHAGE-XR) 500 MG 24 hr tablet Take 500 mg by mouth every evening.  05/31/14  Yes Historical Provider, MD  montelukast (SINGULAIR) 10 MG tablet Take 10 mg by mouth at bedtime.   Yes Historical Provider, MD  ONE TOUCH ULTRA TEST test strip USE TO CHECK BLOOD GLUCOSE ONCE A DAY AS INSTRUCTED 05/19/14  Yes Lucky Cowboy, MD  PARoxetine (PAXIL) 20 MG tablet Take 10 mg by mouth daily.  10/19/13  Yes Historical Provider, MD  potassium chloride SA (K-DUR,KLOR-CON) 20 MEQ tablet  05/04/14  Yes Historical Provider, MD  predniSONE (DELTASONE) 10 MG tablet Take 1 tablet (10 mg total) by mouth daily with breakfast. 06/08/14  Yes Russella Dar, NP  simvastatin (ZOCOR) 80 MG tablet Take 80 mg by mouth as directed. Take 1/2 tablet M,W,F   Yes Historical Provider, MD  simvastatin (ZOCOR) 80 MG tablet Take 40 mg by mouth every Monday, Wednesday, and Friday.   Yes Historical Provider, MD  tiotropium (SPIRIVA) 18 MCG inhalation capsule Place 18 mcg into inhaler and inhale daily.   Yes Historical Provider, MD  vitamin B-12 (CYANOCOBALAMIN) 1000 MCG tablet Take 1,000 mcg by mouth daily.   Yes Historical Provider, MD  atorvastatin (LIPITOR) 40 MG tablet  06/04/14   Historical Provider, MD    Physical Exam: Filed Vitals:   11/04/14 2215 11/04/14 2216 11/04/14 2228 11/04/14 2230  BP:  119/50 119/50  128/54  Pulse: 69 71 70 69  Temp:      TempSrc:      Resp: 27 25 27 26   SpO2: 95% 93% 100% 100%     General:  Moderately built and nourished.  Eyes: Anicteric no pallor.  ENT: No discharge from the ears eyes nose and mouth.  Neck: No mass felt. No neck rigidity.  Cardiovascular: S1 and S2 heard.  Respiratory: No rhonchi or crepitations.  Abdomen: Soft nontender bowel sounds present.  Skin: No rash.  Musculoskeletal: No edema.  Psychiatric: Patient is lethargic.  Neurologic: Patient is lethargic but follows commands and moves  all extremities.  Labs on Admission:  Basic Metabolic Panel:  Recent Labs Lab 11/04/14 1630  NA 138  K 3.9  CL 95*  CO2 33*  GLUCOSE 200*  BUN 19  CREATININE 1.52*  CALCIUM 9.3   Liver Function Tests:  Recent Labs Lab 11/04/14 1630  AST 23  ALT 9  ALKPHOS 69  BILITOT 1.2  PROT 6.6  ALBUMIN 3.7   No results for input(s): LIPASE, AMYLASE in the last 168 hours. No results for input(s): AMMONIA in the last 168 hours. CBC:  Recent Labs Lab 11/04/14 1630  WBC 12.4*  NEUTROABS 10.9*  HGB 12.0  HCT 41.0  MCV 92.8  PLT 153   Cardiac Enzymes: No results for input(s): CKTOTAL, CKMB, CKMBINDEX, TROPONINI in the last 168 hours.  BNP (last 3 results) No results for input(s): BNP in the last 8760 hours.  ProBNP (last 3 results)  Recent Labs  12/04/13 2258 06/02/14  PROBNP 17479.0* 13998.0*    CBG:  Recent Labs Lab 11/04/14 1903  GLUCAP 170*    Radiological Exams on Admission: Ct Head Wo Contrast  11/04/2014   CLINICAL DATA:  Altered mental status.  Found on floor.  EXAM: CT HEAD WITHOUT CONTRAST  TECHNIQUE: Contiguous axial images were obtained from the base of the skull through the vertex without intravenous contrast.  COMPARISON:  03/27/2011  FINDINGS: There is no intracranial hemorrhage, mass or evidence of acute infarction. There is no extra-axial fluid collection. There is moderate generalized  atrophy. There is moderate periventricular hypodensity consistent with microvascular disease. No significant bony abnormality is evident.  IMPRESSION: Moderate atrophy and chronic microvascular disease. No acute findings are evident.   Electronically Signed   By: Ellery Plunk M.D.   On: 11/04/2014 22:02   Dg Chest Portable 1 View  11/04/2014   CLINICAL DATA:  Altered mental status  EXAM: PORTABLE CHEST - 1 VIEW  COMPARISON:  June 05, 2014  FINDINGS: There is scarring in the left mid and lower lung zones. There is no edema or consolidation. Heart is borderline enlarged with pulmonary vascularity within normal limits. Pacemaker leads are attached to the right atrium and right ventricle. There is atherosclerotic change in aorta. No pneumothorax. No adenopathy. No bone lesions.  IMPRESSION: Areas of scarring on the left. No edema or consolidation. Cardiac prominence is stable.   Electronically Signed   By: Bretta Bang III M.D.   On: 11/04/2014 16:47     Assessment/Plan Principal Problem:   Acute encephalopathy Active Problems:   COPD (chronic obstructive pulmonary disease)   Chronic systolic heart failure   Chronic atrial fibrillation   Diabetes mellitus type 2, controlled   1. Acute encephalopathy - cause is not clear. Patient is febrile. Patient denies any headache and is following commands and not lethargic. Patient has mild respiratory acidosis for which patient has been placed on BiPAP. Check urine drug screen ammonia TSH. At this time patient has been empirically placed on vancomycin ceftriaxone and acyclovir for meningitis dose. Patient is on Apixaban and cannot get LP done for at least 24 hours. Check EEG. If patient remains confused and lethargic may need repeat CT head as we cannot do MRI because patient has pacemaker. Repeat ABG. 2. Fever - see #1. Follow blood cultures and check influenza PCR. Patient is empirically placed on antibiotics for meningitis/encephalitis. 3. Severe  COPD - continue inhalers. Continue on BiPAP for now recheck ABG. I have placed patient on IV steroids and Pulmicort along with nebulizer. 4. Chronic  atrial fibrillation - presently rate controlled. EKG is pending. Sensation may require procedures I have held patient's Apixaban in place patient on heparin infusion for now. Patient's CHADS2VASC score is more than 2. 5. Chronic systolic heart failure last EF measured on October 2015 was 45% - on Lasix. 6. Chronic kidney disease - closely follow metabolic panel. 7. Diabetes mellitus type 2 - continue present medications with sliding scale coverage. 8. History of complete heart block status post pacemaker placement and was recently inpatient cardiologist's office. 9. History of polymorphic VT with history of prolonged QT - check EKG. Closely monitor in telemetry.   DVT Prophylaxis heparin infusion.  Code Status: DO NOT RESUSCITATE.  Family Communication: Patient's daughter at the bedside.  Disposition Plan: Admit to inpatient.    Ender Rorke N. [facility] Pager [phone].  If 7PM-7AM, please contact night-coverage [email or web address] Password [email or web address] 11/04/2014, 11:30 PM

## 2014-11-05 ENCOUNTER — Inpatient Hospital Stay (HOSPITAL_COMMUNITY): Payer: Medicare Other

## 2014-11-05 DIAGNOSIS — N183 Chronic kidney disease, stage 3 unspecified: Secondary | ICD-10-CM | POA: Diagnosis present

## 2014-11-05 DIAGNOSIS — I442 Atrioventricular block, complete: Secondary | ICD-10-CM

## 2014-11-05 DIAGNOSIS — R569 Unspecified convulsions: Secondary | ICD-10-CM | POA: Diagnosis present

## 2014-11-05 DIAGNOSIS — I4891 Unspecified atrial fibrillation: Secondary | ICD-10-CM

## 2014-11-05 DIAGNOSIS — J431 Panlobular emphysema: Secondary | ICD-10-CM

## 2014-11-05 DIAGNOSIS — I5022 Chronic systolic (congestive) heart failure: Secondary | ICD-10-CM | POA: Diagnosis present

## 2014-11-05 DIAGNOSIS — J449 Chronic obstructive pulmonary disease, unspecified: Secondary | ICD-10-CM | POA: Diagnosis present

## 2014-11-05 LAB — CBC
HCT: 36.3 % (ref 36.0–46.0)
HCT: 36 % (ref 36.0–46.0)
HEMOGLOBIN: 10.4 g/dL — AB (ref 12.0–15.0)
Hemoglobin: 10.1 g/dL — ABNORMAL LOW (ref 12.0–15.0)
MCH: 26.4 pg (ref 26.0–34.0)
MCH: 27.4 pg (ref 26.0–34.0)
MCHC: 27.8 g/dL — ABNORMAL LOW (ref 30.0–36.0)
MCHC: 28.9 g/dL — ABNORMAL LOW (ref 30.0–36.0)
MCV: 94.8 fL (ref 78.0–100.0)
MCV: 94.7 fL (ref 78.0–100.0)
PLATELETS: 128 10*3/uL — AB (ref 150–400)
PLATELETS: 104 10*3/uL — AB (ref 150–400)
RBC: 3.83 MIL/uL — ABNORMAL LOW (ref 3.87–5.11)
RBC: 3.8 MIL/uL — ABNORMAL LOW (ref 3.87–5.11)
RDW: 15.1 % (ref 11.5–15.5)
RDW: 15.1 % (ref 11.5–15.5)
WBC: 9.1 10*3/uL (ref 4.0–10.5)
WBC: 7.7 10*3/uL (ref 4.0–10.5)

## 2014-11-05 LAB — GLUCOSE, CAPILLARY
GLUCOSE-CAPILLARY: 174 mg/dL — AB (ref 70–99)
GLUCOSE-CAPILLARY: 118 mg/dL — AB (ref 70–99)
Glucose-Capillary: 132 mg/dL — ABNORMAL HIGH (ref 70–99)
Glucose-Capillary: 134 mg/dL — ABNORMAL HIGH (ref 70–99)
Glucose-Capillary: 219 mg/dL — ABNORMAL HIGH (ref 70–99)
Glucose-Capillary: 283 mg/dL — ABNORMAL HIGH (ref 70–99)

## 2014-11-05 LAB — BLOOD GAS, ARTERIAL
ACID-BASE EXCESS: 8.6 mmol/L — AB (ref 0.0–2.0)
Bicarbonate: 33.3 mEq/L — ABNORMAL HIGH (ref 20.0–24.0)
DRAWN BY: 10006
Delivery systems: POSITIVE
EXPIRATORY PAP: 5
FIO2: 0.5 %
Inspiratory PAP: 12
O2 Saturation: 94.4 %
PO2 ART: 76 mmHg — AB (ref 80.0–100.0)
Patient temperature: 98.6
RATE: 8 resp/min
TCO2: 35 mmol/L (ref 0–100)
pCO2 arterial: 53.4 mmHg — ABNORMAL HIGH (ref 35.0–45.0)
pH, Arterial: 7.412 (ref 7.350–7.450)

## 2014-11-05 LAB — BASIC METABOLIC PANEL
Anion gap: 8 (ref 5–15)
BUN: 20 mg/dL (ref 6–23)
CHLORIDE: 97 mmol/L (ref 96–112)
CO2: 37 mmol/L — AB (ref 19–32)
Calcium: 8.9 mg/dL (ref 8.4–10.5)
Creatinine, Ser: 1.67 mg/dL — ABNORMAL HIGH (ref 0.50–1.10)
GFR calc non Af Amer: 28 mL/min — ABNORMAL LOW (ref >90)
GFR, EST AFRICAN AMERICAN: 32 mL/min — AB (ref >90)
Glucose, Bld: 175 mg/dL — ABNORMAL HIGH (ref 70–99)
Potassium: 4.2 mmol/L (ref 3.5–5.1)
SODIUM: 142 mmol/L (ref 135–145)

## 2014-11-05 LAB — TSH: TSH: 0.801 u[IU]/mL (ref 0.350–4.500)

## 2014-11-05 LAB — HEPARIN LEVEL (UNFRACTIONATED): HEPARIN UNFRACTIONATED: 1.02 [IU]/mL — AB (ref 0.30–0.70)

## 2014-11-05 LAB — RAPID URINE DRUG SCREEN, HOSP PERFORMED
Amphetamines: NOT DETECTED
Barbiturates: NOT DETECTED
Benzodiazepines: NOT DETECTED
Cocaine: NOT DETECTED
OPIATES: NOT DETECTED
Tetrahydrocannabinol: NOT DETECTED

## 2014-11-05 LAB — INFLUENZA PANEL BY PCR (TYPE A & B)
H1N1 flu by pcr: NOT DETECTED
INFLBPCR: NEGATIVE
Influenza A By PCR: NEGATIVE

## 2014-11-05 LAB — CK TOTAL AND CKMB (NOT AT ARMC)
CK, MB: 1.3 ng/mL (ref 0.3–4.0)
Relative Index: INVALID (ref 0.0–2.5)
Total CK: 97 U/L (ref 7–177)

## 2014-11-05 LAB — TROPONIN I
TROPONIN I: 0.03 ng/mL (ref <0.031)
Troponin I: <0.03 ng/mL (ref <0.031)
Troponin I: <0.03 ng/mL (ref <0.031)

## 2014-11-05 LAB — AMMONIA: Ammonia: 19 umol/L (ref 11–32)

## 2014-11-05 LAB — APTT
aPTT: 58 seconds — ABNORMAL HIGH (ref 24–37)
aPTT: 82 seconds — ABNORMAL HIGH (ref 24–37)

## 2014-11-05 LAB — MRSA PCR SCREENING: MRSA BY PCR: NEGATIVE

## 2014-11-05 LAB — CK: Total CK: 104 U/L (ref 7–177)

## 2014-11-05 MED ORDER — LEVETIRACETAM IN NACL 500 MG/100ML IV SOLN
500.0000 mg | Freq: Two times a day (BID) | INTRAVENOUS | Status: AC
  Administered 2014-11-05 – 2014-11-08 (×7): 500 mg via INTRAVENOUS
  Filled 2014-11-05 (×7): qty 100

## 2014-11-05 MED ORDER — FUROSEMIDE 10 MG/ML IJ SOLN
20.0000 mg | Freq: Every day | INTRAMUSCULAR | Status: DC
  Administered 2014-11-06 – 2014-11-08 (×3): 20 mg via INTRAVENOUS
  Filled 2014-11-05 (×3): qty 2

## 2014-11-05 MED ORDER — VANCOMYCIN HCL IN DEXTROSE 750-5 MG/150ML-% IV SOLN
750.0000 mg | INTRAVENOUS | Status: DC
  Filled 2014-11-05: qty 150

## 2014-11-05 MED ORDER — CETYLPYRIDINIUM CHLORIDE 0.05 % MT LIQD
7.0000 mL | Freq: Two times a day (BID) | OROMUCOSAL | Status: DC
  Administered 2014-11-05 – 2014-11-09 (×9): 7 mL via OROMUCOSAL

## 2014-11-05 MED ORDER — HEPARIN (PORCINE) IN NACL 100-0.45 UNIT/ML-% IJ SOLN
1050.0000 [IU]/h | INTRAMUSCULAR | Status: AC
  Administered 2014-11-05: 1000 [IU]/h via INTRAVENOUS
  Administered 2014-11-08: 1050 [IU]/h via INTRAVENOUS
  Filled 2014-11-05 (×6): qty 250

## 2014-11-05 MED ORDER — CEFTRIAXONE SODIUM IN DEXTROSE 20 MG/ML IV SOLN
1.0000 g | INTRAVENOUS | Status: AC
  Administered 2014-11-06 – 2014-11-08 (×3): 1 g via INTRAVENOUS
  Filled 2014-11-05 (×3): qty 50

## 2014-11-05 MED ORDER — ACYCLOVIR SODIUM 50 MG/ML IV SOLN: 600.0000 mg | INTRAVENOUS | Status: DC

## 2014-11-05 MED ORDER — CHLORHEXIDINE GLUCONATE 0.12 % MT SOLN
15.0000 mL | Freq: Two times a day (BID) | OROMUCOSAL | Status: DC
  Administered 2014-11-05 – 2014-11-12 (×14): 15 mL via OROMUCOSAL
  Filled 2014-11-05 (×18): qty 15

## 2014-11-05 MED ORDER — CEFTRIAXONE SODIUM IN DEXTROSE 40 MG/ML IV SOLN
2.0000 g | Freq: Two times a day (BID) | INTRAVENOUS | Status: DC
  Administered 2014-11-05 (×2): 2 g via INTRAVENOUS
  Filled 2014-11-05 (×3): qty 50

## 2014-11-05 MED ORDER — ACYCLOVIR SODIUM 50 MG/ML IV SOLN
600.0000 mg | Freq: Once | INTRAVENOUS | Status: AC
  Administered 2014-11-05: 600 mg via INTRAVENOUS
  Filled 2014-11-05: qty 12

## 2014-11-05 NOTE — Procedures (Signed)
History: 81 yo F with altered mental status  Sedation: None  Technique: This is a 17 channel routine scalp EEG performed at the bedside with bipolar and monopolar montages arranged in accordance to the international 10/20 system of electrode placement. One channel was dedicated to EKG recording.    Background: The background consists of high voltage irregular delta and theta activity with some periods of relative suppression. There is no evolution to this activity. There is a posterior dominant rhythm at 7 Hz seen at times. There are occasional sharp waves seen maximal at P7, O1.   Photic stimulation: Physiologic driving is not performed  EEG Abnormalities: 1) Left occipito-parietal sharp waves.  2) Generalized slow activity.   Clinical Interpretation: This EEG is consistent with a potential seizure focus in the left parieto-occipital region. There was no seizure recorded on this study.   Ritta SlotMcNeill Alys Dulak, MD [facility] [phone]  If 7pm- 7am, please page neurology on call as listed in AMION.

## 2014-11-05 NOTE — Progress Notes (Signed)
  Echocardiogram 2D Echocardiogram  has been performed.  [name], [name] 11/05/2014, 3:57 PM

## 2014-11-05 NOTE — Progress Notes (Signed)
ANTICOAGULATION CONSULT NOTE   Pharmacy Consult for heparin Indication: afib  Allergies  Allergen Reactions  . Advair Diskus [Fluticasone-Salmeterol]     Itching  . Biaxin [Clarithromycin]     N/V  . Ciprofloxacin     N/V  . Doxycycline Other (See Comments)    Inside of mouth and tongue red and peeled  . Keflex [Cephalexin]     N/V  . Ketek [Telithromycin]     N/V  . Levaquin [Levofloxacin In D5w] Other (See Comments)    Arrythmia, polymorphic Vtach on 10/15 hospitalization  . Macrobid [Nitrofurantoin]     N/V  . Penicillins     Rash  . Sulfonamide Derivatives     Patient Measurements: Height: 5\' 3"  (160 cm) Weight: 174 lb 4.8 oz (79.062 kg) IBW/kg (Calculated) : 52.4 Heparin Dosing Weight: 70kg  Vital Signs: Temp: 97.8 F (36.6 C) (03/15 2000) Temp Source: Axillary (03/15 2000) BP: 126/44 mmHg (03/15 2008) Pulse Rate: 70 (03/15 2008)  Labs:  Recent Labs  11/04/14 1630 11/04/14 1800 11/05/14 0050 11/05/14 0345 11/05/14 0910 11/05/14 1445 11/05/14 2006  HGB 12.0  --   --  10.1* 10.4*  --   --   HCT 41.0  --   --  36.3 36.0  --   --   PLT 153  --   --  128* 104*  --   --   APTT  --   --   --   --  58*  --  82*  LABPROT  --  16.1*  --   --   --   --   --   INR  --  1.27  --   --   --   --   --   HEPARINUNFRC  --   --   --   --  1.02*  --   --   CREATININE 1.52*  --   --  1.67*  --   --   --   CKTOTAL  --   --   --   --  104 97  --   CKMB  --   --   --   --   --  1.3  --   TROPONINI  --   --  0.03  --  <0.03 <0.03  --     Estimated Creatinine Clearance: 26.8 mL/min (by C-G formula based on Cr of 1.67).   Assessment: 81 yo female on Eliquis PTA (last dose 3/13) and bridging with heparin for afib/flutter. Eliquis affecting heparin (anit-Xa) level so using aPTT to titrate heparin.   This AM, Hgb down a bit, plt down to 104 (153 at admit). No issues with line or bleeding per RN.  Repeat aPTT this PM is 82 - therapeutic for goal range.   Goal of  Therapy:  Heparin level 0.3-0.7 units/ml aPTT 66-102 seconds Vancomycin trough 15-20 Monitor platelets by anticoagulation protocol: Yes   Plan:  Continue heparin at 1150 units/hr Follow-up AM heparin level and aPTT  Link SnufferJessica Atif Chapple, PharmD, BCPS Clinical Pharmacist [phone] 11/05/2014,9:15 PM

## 2014-11-05 NOTE — Progress Notes (Signed)
Utilization Review Completed.  

## 2014-11-05 NOTE — Progress Notes (Signed)
[facility] TEAM 1 - Stepdown/ICU TEAM Progress Note  [name]:[id number] DOB: [birth year] DOA: 11/04/2014 PCP: Nadean Corwin, MD  Admit HPI / Brief Narrative: [name] is a 81 y.o. WF  PMHX  severe COPD on 5 L home oxygen, chronic atrial fibrillation, complete heart block status post pacemaker placement, chronic systolic heart failure with last year measured was 45% in October 2015, diabetes mellitus type 2, hyperlipidemia, DVT   Brought to the ER after patient was found unconscious in her house by patient's family. As per patient daughter who provided the history patient was found to flow at around 2 PM when patient's family released home to check on the patient. Within a few minutes patient regained consciousness but not confused. Patient was brought to the ER. Patient was found to be febrile. Patient was able to give answers but was lethargic. ABG shows mild respiratory acidosis and patient was placed on BiPAP. CT of the head did not show anything acute. Patient had blood cultures drawn and was started on empiric antibiotics. On exam patient is following commands and answers questions but gets very sleepy. Patient states she has had a new oxygen tank cord which is lengthy and was not sure if she had tripped and fell on it.  HPI/Subjective: 3/15 A/O 3 (does not recall why)   Assessment/Plan: Acute encephalopathy -UDS negative -TSH within normal limit -Head CT negative for acute findings; cannot obtain MRI secondary to pacer  -Patient hypotensive upon admission will DC Lasix. SBP goal =140-145 -Blood culture pending -Urine was dirty with many bacteria; Urine culture pending -Patient A/O 3, negative leukocytosis, afebrile would not perform LP at this time - Seizures -EEG; positive for focal seizure see results below -Dr. Thana Farr (neurology); discussed findings of abnormal EEG and recommendation was to start Keppra 500 BID -If patient does not continue to  improve or her encephalopathy worsens recommendation was to restart Acyclovir and perform LP  UTI -Patient with cloudy urine and many bacteria, continue ceftriaxone, will DC acyclovir and vancomycin -Bear hugger maintain patient's temp 36.6c  Severe COPD  - continue inhalers. Continue on BiPAP for now recheck ABG.  -Continue IV steroids and Pulmicort along with nebulizer.  Chronic atrial fibrillation  - Currently rate controlled  -Obtain echocardiogram  -presently rate controlled. EKG is pending. Sensation may require procedures I have held patient's Apixaban in place patient on heparin infusion for now. Patient's CHADS2VASC score is more than 2.  Chronic systolic heart failure (October 2015 was 45% )  -Patient has relative hypotension for a person of her age will cut Lasix to 20 mg daily  -Place TED hose; when patient awake, ensure patient's removes before retiring to bed.  -Obtain orthostatic vitals   Chronic kidney failure (baseline Cr 1.25-1.93) - Currently at baseline monitor carefully  Complete heart block -S/P pacemaker present  Polymorphic VT with history of prolonged QT  -Monitor telemetry closely - check EKG. Closely monitor in telemetry.  Code Status: FULL Family Communication: Daughter present at time of exam Disposition Plan: Resolution cephalopathy    Consultants: -Dr. Thana Farr (neurology); phone consult    Procedure/Significant Events: 06/02/14 echocardiogram;- LVEF= 45%. Hypokinesis of the apical myocardium. - Right ventricle: Hypokinesis of the RV apex. - Hypokinesis oif the entire LV and RV apex.  3/14 CT head without contrast; -Moderate atrophy and chronic microvascular disease.  -No acute findings are evident. 3/15 EEG; consistent with a potential seizure focus in the left parieto-occipital region. There was no seizure  recorded on this study   Culture    Antibiotics: Vancomycin 3/14>> stop 3/15  Acyclovir 3/16>> stopped 3/15    Ceftriaxone 3/15>>   DVT prophylaxis:  heparin infusion   LINES / TUBES:      Continuous Infusions: . heparin 1,150 Units/hr (11/05/14 1155)    Objective: VITAL SIGNS: Temp: 97.8 F (36.6 C) (03/15 2000) Temp Source: Axillary (03/15 2000) BP: 109/53 mmHg (03/15 1631) Pulse Rate: 71 (03/15 1631) SPO2; FIO2:   Intake/Output Summary (Last 24 hours) at 11/05/14 2019 Last data filed at 11/05/14 1959  Gross per 24 hour  Intake     81 ml  Output   1550 ml  Net  -1469 ml     Exam: General:  lethargic, but arousable, A/O 3 (does not recall why),  No acute respiratory distress Lungs: Clear to auscultation bilaterally without wheezes or crackles Cardiovascular: Regular rate and rhythm without murmur gallop or rub normal S1 and S2 Abdomen: Nontender, nondistended, soft, bowel sounds positive, no rebound, no ascites, no appreciable mass Extremities: No significant cyanosis, clubbing, or edema bilateral lower extremities Neurologic; cranial nerves II through XII intact, extremity strength 5/5, sensation intact throughout, follows all commands, Babinski within normal limit. Did not ambulate patient.    Data Reviewed: Basic Metabolic Panel:  Recent Labs Lab 11/04/14 1630 11/05/14 0345  NA 138 142  K 3.9 4.2  CL 95* 97  CO2 33* 37*  GLUCOSE 200* 175*  BUN 19 20  CREATININE 1.52* 1.67*  CALCIUM 9.3 8.9   Liver Function Tests:  Recent Labs Lab 11/04/14 1630  AST 23  ALT 9  ALKPHOS 69  BILITOT 1.2  PROT 6.6  ALBUMIN 3.7   No results for input(s): LIPASE, AMYLASE in the last 168 hours.  Recent Labs Lab 11/05/14 0725  AMMONIA 19   CBC:  Recent Labs Lab 11/04/14 1630 11/05/14 0345 11/05/14 0910  WBC 12.4* 9.1 7.7  NEUTROABS 10.9*  --   --   HGB 12.0 10.1* 10.4*  HCT 41.0 36.3 36.0  MCV 92.8 94.8 94.7  PLT 153 128* 104*   Cardiac Enzymes:  Recent Labs Lab 11/05/14 0050 11/05/14 0910 11/05/14 1445  CKTOTAL  --  104 97  CKMB  --   --  1.3   TROPONINI 0.03 <0.03 <0.03   BNP (last 3 results) No results for input(s): BNP in the last 8760 hours.  ProBNP (last 3 results)  Recent Labs  12/04/13 2258 06/02/14  PROBNP 17479.0* 13998.0*    CBG:  Recent Labs Lab 11/05/14 0457 11/05/14 0830 11/05/14 1209 11/05/14 1629 11/05/14 1951  GLUCAP 174* 132* 118* 219* 283*    Recent Results (from the past 240 hour(s))  MRSA PCR Screening     Status: None   Collection Time: 11/04/14 11:45 PM  Result Value Ref Range Status   MRSA by PCR NEGATIVE NEGATIVE Final    Comment:        The GeneXpert MRSA Assay (FDA approved for NASAL specimens only), is one component of a comprehensive MRSA colonization surveillance program. It is not intended to diagnose MRSA infection nor to guide or monitor treatment for MRSA infections.      Studies:  Recent x-ray studies have been reviewed in detail by the Attending Physician  Scheduled Meds:  Scheduled Meds: . antiseptic oral rinse  7 mL Mouth Rinse q12n4p  . atorvastatin  40 mg Oral q1800  . budesonide (PULMICORT) nebulizer solution  0.25 mg Nebulization BID  . [START ON 11/06/2014] cefTRIAXone (  ROCEPHIN)  IV  1 g Intravenous Q24H  . chlorhexidine  15 mL Mouth Rinse BID  . [START ON 11/06/2014] furosemide  20 mg Intravenous Daily  . insulin aspart  0-9 Units Subcutaneous Q4H  . ipratropium  0.5 mg Nebulization Q6H  . levalbuterol  0.63 mg Nebulization Q6H  . levETIRAcetam  500 mg Intravenous Q12H  . linagliptin  5 mg Oral Daily  . methylPREDNISolone (SOLU-MEDROL) injection  40 mg Intravenous Daily  . montelukast  10 mg Oral QHS  . pantoprazole  40 mg Oral Daily  . PARoxetine  10 mg Oral Daily  . sodium chloride  3 mL Intravenous Q12H    Time spent on care of this patient: 40 mins   WOODS, Roselind MessierURTIS J , MD  [facility] Office  [phone] Pager - [phone]  On-Call/Text Page:      [email or web address]      password [email or web address]  If 7PM-7AM, please contact  night-coverage [email or web address] Password [email or web address] 11/05/2014, 8:19 PM   LOS: 1 day   Care during the described time interval was provided by me .  I have reviewed this patient's available data, including medical history, events of note, physical examination, radiology studies and test results as part of my evaluation  Carolyne Littlesurtis Woods, MD [phone] Pager

## 2014-11-05 NOTE — Progress Notes (Signed)
ANTICOAGULATION CONSULT NOTE   Pharmacy Consult for heparin Indication: afib  Allergies  Allergen Reactions  . Advair Diskus [Fluticasone-Salmeterol]     Itching  . Biaxin [Clarithromycin]     N/V  . Ciprofloxacin     N/V  . Doxycycline Other (See Comments)    Inside of mouth and tongue red and peeled  . Keflex [Cephalexin]     N/V  . Ketek [Telithromycin]     N/V  . Levaquin [Levofloxacin In D5w] Other (See Comments)    Arrythmia, polymorphic Vtach on 10/15 hospitalization  . Macrobid [Nitrofurantoin]     N/V  . Penicillins     Rash  . Sulfonamide Derivatives     Patient Measurements: Height: 5\' 3"  (160 cm) Weight: 174 lb 4.8 oz (79.062 kg) IBW/kg (Calculated) : 52.4 Heparin Dosing Weight: 70kg  Vital Signs: Temp: 99.1 F (37.3 C) (03/15 0843) Temp Source: Axillary (03/15 0843) BP: 114/65 mmHg (03/15 1100) Pulse Rate: 70 (03/15 1100)  Labs:  Recent Labs  11/04/14 1630 11/04/14 1800 11/05/14 0050 11/05/14 0345 11/05/14 0910  HGB 12.0  --   --  10.1* 10.4*  HCT 41.0  --   --  36.3 36.0  PLT 153  --   --  128* 104*  APTT  --   --   --   --  58*  LABPROT  --  16.1*  --   --   --   INR  --  1.27  --   --   --   HEPARINUNFRC  --   --   --   --  1.02*  CREATININE 1.52*  --   --  1.67*  --   CKTOTAL  --   --   --   --  104  TROPONINI  --   --  0.03  --  <0.03    Estimated Creatinine Clearance: 26.8 mL/min (by C-G formula based on Cr of 1.67).   Assessment: 81 yo female on Eliquis PTA (last dose 3/13) and bridging with heparin for afib/flutter. Eliquis affecting heparin (anit-Xa) level so using aPTT to titrate heparin. Anti-Xa level remains elevated 1.02 (due to Eliquis) while aPTT 58 (subtherapeutic) on 1000 units/hr. Hgb down a bit, plt down to 104 (153 at admit). No issues with line or bleeding per RN.  Goal of Therapy:  Heparin level 0.3-0.7 units/ml aPTT 66-102 seconds Vancomycin trough 15-20 Monitor platelets by anticoagulation protocol: Yes    Plan:  Increase heparin to 1150 units/hr Will f/u 8 hour aPTT Daily heparin level and aPTT  Christoper Fabianaron Lashun Mccants, PharmD, BCPS Clinical pharmacist, pager [phone] 11/05/2014,11:10 AM

## 2014-11-05 NOTE — Progress Notes (Signed)
EEG completed; results pending.    

## 2014-11-06 LAB — GLUCOSE, CAPILLARY
GLUCOSE-CAPILLARY: 136 mg/dL — AB (ref 70–99)
Glucose-Capillary: 183 mg/dL — ABNORMAL HIGH (ref 70–99)
Glucose-Capillary: 154 mg/dL — ABNORMAL HIGH (ref 70–99)
Glucose-Capillary: 170 mg/dL — ABNORMAL HIGH (ref 70–99)
Glucose-Capillary: 242 mg/dL — ABNORMAL HIGH (ref 70–99)
Glucose-Capillary: 246 mg/dL — ABNORMAL HIGH (ref 70–99)

## 2014-11-06 LAB — APTT
APTT: 116 s — AB (ref 24–37)
APTT: 96 s — AB (ref 24–37)

## 2014-11-06 LAB — CBC
HCT: 35.5 % — ABNORMAL LOW (ref 36.0–46.0)
Hemoglobin: 10.4 g/dL — ABNORMAL LOW (ref 12.0–15.0)
MCH: 27.2 pg (ref 26.0–34.0)
MCHC: 29.3 g/dL — ABNORMAL LOW (ref 30.0–36.0)
MCV: 92.9 fL (ref 78.0–100.0)
Platelets: 119 10*3/uL — ABNORMAL LOW (ref 150–400)
RBC: 3.82 MIL/uL — AB (ref 3.87–5.11)
RDW: 14.7 % (ref 11.5–15.5)
WBC: 7.2 10*3/uL (ref 4.0–10.5)

## 2014-11-06 LAB — URINE CULTURE
Colony Count: NO GROWTH
Culture: NO GROWTH

## 2014-11-06 LAB — TROPONIN I: Troponin I: <0.03 ng/mL (ref <0.031)

## 2014-11-06 LAB — HEPARIN LEVEL (UNFRACTIONATED): HEPARIN UNFRACTIONATED: 1.06 [IU]/mL — AB (ref 0.30–0.70)

## 2014-11-06 MED ORDER — INSULIN ASPART 100 UNIT/ML ~~LOC~~ SOLN
0.0000 [IU] | Freq: Three times a day (TID) | SUBCUTANEOUS | Status: DC
  Administered 2014-11-07: 1 [IU] via SUBCUTANEOUS
  Administered 2014-11-07: 5 [IU] via SUBCUTANEOUS
  Administered 2014-11-07: 3 [IU] via SUBCUTANEOUS

## 2014-11-06 MED ORDER — LEVALBUTEROL HCL 0.63 MG/3ML IN NEBU
0.6300 mg | INHALATION_SOLUTION | Freq: Four times a day (QID) | RESPIRATORY_TRACT | Status: DC
  Administered 2014-11-06 – 2014-11-09 (×11): 0.63 mg via RESPIRATORY_TRACT
  Filled 2014-11-06 (×21): qty 3

## 2014-11-06 MED ORDER — LEVALBUTEROL HCL 0.63 MG/3ML IN NEBU: 0.6300 mg | INHALATION_SOLUTION | RESPIRATORY_TRACT | Status: DC | PRN

## 2014-11-06 MED ORDER — IPRATROPIUM BROMIDE 0.02 % IN SOLN
0.5000 mg | Freq: Four times a day (QID) | RESPIRATORY_TRACT | Status: DC
Start: 2014-11-06 — End: 2014-11-09
  Administered 2014-11-06 – 2014-11-09 (×11): 0.5 mg via RESPIRATORY_TRACT
  Filled 2014-11-06 (×11): qty 2.5

## 2014-11-06 NOTE — Clinical Documentation Improvement (Signed)
Pt dependent on oxygen 5L at home; ED note reflects " satting in the 60s, tachypnea, increased WOB." Pt placed on non-rebreather mask then Bipap and currently on non-rebreather at 15 L.  Please identify any additional clinical conditions associated with the increased oxygen requirements / increased work of breathing and document in your progress note and carry over to the discharge summary.    Possible Clinical Conditions: -Acute on chronic respiratory failure -Acute respiratory failure -Chronic respiratory failure -Other condition (please specify) -Unable to determine at present  Thank you, Doy MinceVangela Fransico Sciandra, RN [phone] Clinical Documentation Specialist

## 2014-11-06 NOTE — Progress Notes (Signed)
11/06/2014 patient foley was D/C at 1600 by Kendal HymenBonnie (Nurse Tech) . Patient had 800 cc of yellow urine. [facility] Center[facility]adine Kyriaki Moder RN.

## 2014-11-06 NOTE — Progress Notes (Signed)
[facility] TEAM 1 - Stepdown/ICU TEAM Progress Note  [name] ZOX:[id number] DOB: 10/01/1931 DOA: 11/04/2014 PCP: [name],[name] DAVID, MD  Admit HPI / Brief Narrative: 81 y.o. F HX  severe COPD on 5 L home oxygen, chronic atrial fibrillation, complete heart block status post pacemaker, chronic systolic heart failure with EF 45% in October 2015, diabetes mellitus type 2, hyperlipidemia, and DVT who was brought to the ER after patient was found unconscious in her house by patient's family. Within a few minutes patient regained consciousness but was confused. Patient was brought to the ER. Patient was found to be febrile. Patient was able to give answers but was lethargic. ABG showed mild respiratory acidosis and patient was placed on BiPAP. CT of the head did not show anything acute. Patient had blood cultures drawn and was started on empiric antibiotics. On exam patient is following commands and answers questions but gets very sleepy. Patient stated she had a new oxygen tank cord which is lengthy and was not sure if she had tripped and fell on it.  HPI/Subjective: Pt does not appear to be in distress but is lethargic and unable to provide a hx.  There is no family present.    Assessment/Plan:  Acute encephalopathy -UDS negative - TSH normal - Head CT negative for acute findings; cannot obtain MRI secondary to pacer - urine culture pending   Acute on chronic hypoxic and hypercapnic resp failure Wean oxygen as able - no evidence of respiratory distress today  Seizures -EEG positive for focal seizure - Neurology recommended Keppra 500 BID - if patient does not improve or if her encephalopathy worsens recommendation was to restart Acyclovir and perform LP  UTI -Patient with cloudy urine and many bacteria - continue ceftriaxone empirically and follow culture results   Severe COPD   -continue outpt med regimen   Chronic atrial fibrillation  -rate controlled - on IV heparin until clear  wether further procedures to be required   Chronic systolic heart failure (October 2015 EF 45%)  -no clinical evidence of signif volume overload at this time - follow trends in wgt and Is/Os  Chronic kidney failure (baseline Cr 1.25 - 1.93) -Currently at baseline  Complete heart block -S/P pacemaker  Polymorphic VT with history of prolonged QT  -Monitor telemetry closely  Code Status: FULL Family Communication: no family present at time of exam  Disposition Plan: SDU  Consultants: Neurology   Procedure/Significant Events: 3/14 CT head without contrast-Moderate atrophy and chronic microvascular disease. -No acute findings are evident. 3/15 EEG; consistent with a potential seizure focus in the left parieto-occipital region. There was no seizure recorded on this study  Antibiotics: Vancomycin 3/14 Cefepime 3/14  Acyclovir 3/14  Ceftriaxone 3/14 >  DVT prophylaxis: heparin IV  Objective: Blood pressure 112/60, pulse 70, temperature 97.7 F (36.5 C), temperature source Oral, resp. rate 17, height 5\' 3"  (1.6 m), weight 78.109 kg (172 lb 3.2 oz), SpO2 94 %.  Intake/Output Summary (Last 24 hours) at 11/06/14 1149 Last data filed at 11/06/14 0751  Gross per 24 hour  Intake 320.37 ml  Output   1725 ml  Net -1404.63 ml   Exam: General:  No acute respiratory distress - lethargic Lungs: Clear to auscultation bilaterally without wheezes or crackles Cardiovascular: Regular rate and rhythm without murmur gallop or rub  Abdomen: Nontender, nondistended, soft, bowel sounds positive, no rebound, no ascites, no appreciable mass Extremities: No significant cyanosis, clubbing, or edema bilateral lower extremities  Data Reviewed: Basic Metabolic  Panel:  Recent Labs Lab 11/04/14 1630 11/05/14 0345  NA 138 142  K 3.9 4.2  CL 95* 97  CO2 33* 37*  GLUCOSE 200* 175*  BUN 19 20  CREATININE 1.52* 1.67*  CALCIUM 9.3 8.9   Liver Function Tests:  Recent Labs Lab 11/04/14 1630    AST 23  ALT 9  ALKPHOS 69  BILITOT 1.2  PROT 6.6  ALBUMIN 3.7    Recent Labs Lab 11/05/14 0725  AMMONIA 19   CBC:  Recent Labs Lab 11/04/14 1630 11/05/14 0345 11/05/14 0910 11/06/14 0124  WBC 12.4* 9.1 7.7 7.2  NEUTROABS 10.9*  --   --   --   HGB 12.0 10.1* 10.4* 10.4*  HCT 41.0 36.3 36.0 35.5*  MCV 92.8 94.8 94.7 92.9  PLT 153 128* 104* 119*   Cardiac Enzymes:  Recent Labs Lab 11/05/14 0050 11/05/14 0910 11/05/14 1445 11/05/14 2006 11/06/14 0015  CKTOTAL  --  104 97  --   --   CKMB  --   --  1.3  --   --   TROPONINI 0.03 <0.03 <0.03 <0.03 <0.03    CBG:  Recent Labs Lab 11/05/14 1629 11/05/14 1951 11/06/14 0057 11/06/14 0452 11/06/14 0748  GLUCAP 219* 283* 183* 154* 136*    Recent Results (from the past 240 hour(s))  Blood culture (routine x 2)     Status: None (Preliminary result)   Collection Time: 11/04/14  4:30 PM  Result Value Ref Range Status   Specimen Description BLOOD LEFT ARM  Final   Special Requests BOTTLES DRAWN AEROBIC AND ANAEROBIC 5CC  Final   Culture   Final           BLOOD CULTURE RECEIVED NO GROWTH TO DATE CULTURE WILL BE HELD FOR 5 DAYS BEFORE ISSUING A FINAL NEGATIVE REPORT Performed at [facility]    Report Status PENDING  Incomplete  Blood culture (routine x 2)     Status: None (Preliminary result)   Collection Time: 11/04/14  4:35 PM  Result Value Ref Range Status   Specimen Description BLOOD LEFT FOREARM  Final   Special Requests BOTTLES DRAWN AEROBIC AND ANAEROBIC 5CC  Final   Culture   Final           BLOOD CULTURE RECEIVED NO GROWTH TO DATE CULTURE WILL BE HELD FOR 5 DAYS BEFORE ISSUING A FINAL NEGATIVE REPORT Performed at [facility]    Report Status PENDING  Incomplete  Urine culture     Status: None   Collection Time: 11/04/14  7:54 PM  Result Value Ref Range Status   Specimen Description URINE, RANDOM  Final   Special Requests NONE  Final   Colony Count NO GROWTH Performed at [facility]   Final   Culture NO GROWTH Performed at [facility]   Final   Report Status 11/06/2014 FINAL  Final  MRSA PCR Screening     Status: None   Collection Time: 11/04/14 11:45 PM  Result Value Ref Range Status   MRSA by PCR NEGATIVE NEGATIVE Final    Comment:        The GeneXpert MRSA Assay (FDA approved for NASAL specimens only), is one component of a comprehensive MRSA colonization surveillance program. It is not intended to diagnose MRSA infection nor to guide or monitor treatment for MRSA infections.      Studies:  Recent x-ray studies have been reviewed in detail by the Attending Physician  Scheduled Meds:  Scheduled Meds: . antiseptic oral rinse  7 mL Mouth Rinse q12n4p  . atorvastatin  40 mg Oral q1800  . budesonide (PULMICORT) nebulizer solution  0.25 mg Nebulization BID  . cefTRIAXone (ROCEPHIN)  IV  1 g Intravenous Q24H  . chlorhexidine  15 mL Mouth Rinse BID  . furosemide  20 mg Intravenous Daily  . insulin aspart  0-9 Units Subcutaneous Q4H  . ipratropium  0.5 mg Nebulization Q6H  . levalbuterol  0.63 mg Nebulization Q6H  . levETIRAcetam  500 mg Intravenous Q12H  . linagliptin  5 mg Oral Daily  . methylPREDNISolone (SOLU-MEDROL) injection  40 mg Intravenous Daily  . montelukast  10 mg Oral QHS  . pantoprazole  40 mg Oral Daily  . PARoxetine  10 mg Oral Daily  . sodium chloride  3 mL Intravenous Q12H    Time spent on care of this patient: 35 mins  Lonia Blood, MD [facility] For Consults/Admissions - Flow Manager - [phone] Office  [phone]  Contact MD directly via text page:      [email or web address]      password [email or web address]  11/06/2014, 11:49 AM   LOS: 2 days

## 2014-11-06 NOTE — Progress Notes (Signed)
ANTICOAGULATION CONSULT NOTE   Pharmacy Consult for heparin Indication: afib  Allergies  Allergen Reactions  . Advair Diskus [Fluticasone-Salmeterol]     Itching  . Biaxin [Clarithromycin]     N/V  . Ciprofloxacin     N/V  . Doxycycline Other (See Comments)    Inside of mouth and tongue red and peeled  . Keflex [Cephalexin]     N/V  . Ketek [Telithromycin]     N/V  . Levaquin [Levofloxacin In D5w] Other (See Comments)    Arrythmia, polymorphic Vtach on 10/15 hospitalization  . Macrobid [Nitrofurantoin]     N/V  . Penicillins     Rash  . Sulfonamide Derivatives     Patient Measurements: Height: 5\' 3"  (160 cm) Weight: 172 lb 3.2 oz (78.109 kg) IBW/kg (Calculated) : 52.4 Heparin Dosing Weight: 70kg  Vital Signs: Temp: 97.9 F (36.6 C) (03/16 1227) Temp Source: Oral (03/16 1227) BP: 111/54 mmHg (03/16 1425) Pulse Rate: 69 (03/16 1425)  Labs:  Recent Labs  11/04/14 1630 11/04/14 1800  11/05/14 0345  11/05/14 0910 11/05/14 1445 11/05/14 2006 11/06/14 0015 11/06/14 0124 11/06/14 0126 11/06/14 1314  HGB 12.0  --   --  10.1*  --  10.4*  --   --   --  10.4*  --   --   HCT 41.0  --   --  36.3  --  36.0  --   --   --  35.5*  --   --   PLT 153  --   --  128*  --  104*  --   --   --  119*  --   --   APTT  --   --   --   --   < > 58*  --  82*  --  116*  --  96*  LABPROT  --  16.1*  --   --   --   --   --   --   --   --   --   --   INR  --  1.27  --   --   --   --   --   --   --   --   --   --   HEPARINUNFRC  --   --   --   --   --  1.02*  --   --   --   --  1.06*  --   CREATININE 1.52*  --   --  1.67*  --   --   --   --   --   --   --   --   CKTOTAL  --   --   --   --   --  104 97  --   --   --   --   --   CKMB  --   --   --   --   --   --  1.3  --   --   --   --   --   TROPONINI  --   --   < >  --   --  <0.03 <0.03 <0.03 <0.03  --   --   --   < > = values in this interval not displayed.  Estimated Creatinine Clearance: 26.6 mL/min (by C-G formula based on Cr of  1.67).   Assessment: 81 yo female on Eliquis PTA (last dose 3/13) and bridging with heparin for afib/flutter. Eliquis  affecting heparin (anit-Xa) level so using aPTT to titrate heparin. aPTT therapeutic (96 sec). Plt low but relatively stable, Hgb low but stable.   Goal of Therapy:  Heparin level 0.3-0.7 units/ml aPTT 66-102 seconds Vancomycin trough 15-20 Monitor platelets by anticoagulation protocol: Yes   Plan:  Continue heparin at 1050 units/hr Daily heparin level and aPTT  Christoper Fabian, PharmD, BCPS Clinical pharmacist, pager [phone] 11/06/2014,4:08 PM

## 2014-11-06 NOTE — Progress Notes (Signed)
Patient is currently on 100% NRB and resting comfortably. Vitals are stable and sats are 97%. Will continue to monitor.

## 2014-11-06 NOTE — Progress Notes (Signed)
ANTICOAGULATION CONSULT NOTE - Follow Up Consult  Pharmacy Consult for heparin Indication: atrial fibrillation    Labs:  Recent Labs  11/04/14 1630 11/04/14 1800  11/05/14 0345 11/05/14 0910 11/05/14 1445 11/05/14 2006 11/06/14 0015 11/06/14 0124 11/06/14 0126  HGB 12.0  --   --  10.1* 10.4*  --   --   --  10.4*  --   HCT 41.0  --   --  36.3 36.0  --   --   --  35.5*  --   PLT 153  --   --  128* 104*  --   --   --  119*  --   APTT  --   --   --   --  58*  --  82*  --  116*  --   LABPROT  --  16.1*  --   --   --   --   --   --   --   --   INR  --  1.27  --   --   --   --   --   --   --   --   HEPARINUNFRC  --   --   --   --  1.02*  --   --   --   --  1.06*  CREATININE 1.52*  --   --  1.67*  --   --   --   --   --   --   CKTOTAL  --   --   --   --  104 97  --   --   --   --   CKMB  --   --   --   --   --  1.3  --   --   --   --   TROPONINI  --   --   < >  --  <0.03 <0.03 <0.03 <0.03  --   --   < > = values in this interval not displayed.    Assessment: 81yo female now above PTT goal on heparin, likely accumulating.  Goal of Therapy:  aPTT 66-102 seconds   Plan:  Will decrease heparin gtt by 1-2 units/kg/hr to 1050 units/hr and check level in 8hr.  Vernard GamblesVeronda Jerris Keltz, PharmD, BCPS  11/06/2014,3:44 AM

## 2014-11-07 DIAGNOSIS — E119 Type 2 diabetes mellitus without complications: Secondary | ICD-10-CM

## 2014-11-07 DIAGNOSIS — N390 Urinary tract infection, site not specified: Secondary | ICD-10-CM | POA: Diagnosis present

## 2014-11-07 DIAGNOSIS — I5022 Chronic systolic (congestive) heart failure: Secondary | ICD-10-CM | POA: Diagnosis present

## 2014-11-07 LAB — COMPREHENSIVE METABOLIC PANEL
ALT: 10 U/L (ref 0–35)
ANION GAP: 9 (ref 5–15)
AST: 25 U/L (ref 0–37)
Albumin: 2.9 g/dL — ABNORMAL LOW (ref 3.5–5.2)
Alkaline Phosphatase: 60 U/L (ref 39–117)
BUN: 31 mg/dL — ABNORMAL HIGH (ref 6–23)
CHLORIDE: 94 mmol/L — AB (ref 96–112)
CO2: 36 mmol/L — ABNORMAL HIGH (ref 19–32)
CREATININE: 1.47 mg/dL — AB (ref 0.50–1.10)
Calcium: 9 mg/dL (ref 8.4–10.5)
GFR calc non Af Amer: 32 mL/min — ABNORMAL LOW (ref >90)
GFR, EST AFRICAN AMERICAN: 38 mL/min — AB (ref >90)
Glucose, Bld: 189 mg/dL — ABNORMAL HIGH (ref 70–99)
Potassium: 4.4 mmol/L (ref 3.5–5.1)
SODIUM: 139 mmol/L (ref 135–145)
Total Bilirubin: 0.6 mg/dL (ref 0.3–1.2)
Total Protein: 6 g/dL (ref 6.0–8.3)

## 2014-11-07 LAB — IRON AND TIBC
Iron: 23 ug/dL — ABNORMAL LOW (ref 42–145)
SATURATION RATIOS: 8 % — AB (ref 20–55)
TIBC: 294 ug/dL (ref 250–470)
UIBC: 271 ug/dL (ref 125–400)

## 2014-11-07 LAB — APTT: aPTT: 81 seconds — ABNORMAL HIGH (ref 24–37)

## 2014-11-07 LAB — GLUCOSE, CAPILLARY
GLUCOSE-CAPILLARY: 255 mg/dL — AB (ref 70–99)
GLUCOSE-CAPILLARY: 216 mg/dL — AB (ref 70–99)
Glucose-Capillary: 128 mg/dL — ABNORMAL HIGH (ref 70–99)
Glucose-Capillary: 206 mg/dL — ABNORMAL HIGH (ref 70–99)

## 2014-11-07 LAB — CBC
HEMATOCRIT: 34.9 % — AB (ref 36.0–46.0)
Hemoglobin: 10.1 g/dL — ABNORMAL LOW (ref 12.0–15.0)
MCH: 26.9 pg (ref 26.0–34.0)
MCHC: 28.9 g/dL — ABNORMAL LOW (ref 30.0–36.0)
MCV: 93.1 fL (ref 78.0–100.0)
PLATELETS: 151 10*3/uL (ref 150–400)
RBC: 3.75 MIL/uL — ABNORMAL LOW (ref 3.87–5.11)
RDW: 14.5 % (ref 11.5–15.5)
WBC: 9.3 10*3/uL (ref 4.0–10.5)

## 2014-11-07 LAB — RETICULOCYTES
RBC.: 3.75 MIL/uL — ABNORMAL LOW (ref 3.87–5.11)
Retic Count, Absolute: 93.8 10*3/uL (ref 19.0–186.0)
Retic Ct Pct: 2.5 % (ref 0.4–3.1)

## 2014-11-07 LAB — VITAMIN B12: Vitamin B-12: 992 pg/mL — ABNORMAL HIGH (ref 211–911)

## 2014-11-07 LAB — FERRITIN: Ferritin: 150 ng/mL (ref 10–291)

## 2014-11-07 LAB — FOLATE: Folate: 8.5 ng/mL

## 2014-11-07 LAB — HEPARIN LEVEL (UNFRACTIONATED): HEPARIN UNFRACTIONATED: 0.66 [IU]/mL (ref 0.30–0.70)

## 2014-11-07 MED ORDER — INSULIN ASPART 100 UNIT/ML ~~LOC~~ SOLN
0.0000 [IU] | SUBCUTANEOUS | Status: DC
  Administered 2014-11-07: 5 [IU] via SUBCUTANEOUS
  Administered 2014-11-08: 8 [IU] via SUBCUTANEOUS
  Administered 2014-11-08: 3 [IU] via SUBCUTANEOUS
  Administered 2014-11-08: 2 [IU] via SUBCUTANEOUS
  Administered 2014-11-08: 3 [IU] via SUBCUTANEOUS
  Administered 2014-11-08: 2 [IU] via SUBCUTANEOUS

## 2014-11-07 MED ORDER — INSULIN GLARGINE 100 UNIT/ML ~~LOC~~ SOLN
8.0000 [IU] | Freq: Every day | SUBCUTANEOUS | Status: DC
  Administered 2014-11-07 – 2014-11-08 (×2): 8 [IU] via SUBCUTANEOUS
  Filled 2014-11-07 (×3): qty 0.08

## 2014-11-07 NOTE — Progress Notes (Addendum)
[place] TEAM 1 - Stepdown/ICU TEAM Progress Note  [name] JWJ:[id number] DOB: [birth year] DOA: 11/04/2014 PCP: [name],[name], MD  Admit HPI / Brief Narrative: [name] MarvelGertrude S [name] is a 81 y.o. WF  PMHX  severe COPD on 5 L home oxygen, chronic atrial fibrillation, complete heart block status post pacemaker placement, chronic systolic heart failure with last year measured was 45% in October 2015, diabetes mellitus type 2, hyperlipidemia, DVT   Brought to the ER after patient was found unconscious in her house by patient's family. As per patient daughter who provided the history patient was found to flow at around 2 PM when patient's family released home to check on the patient. Within a few minutes patient regained consciousness but not confused. Patient was brought to the ER. Patient was found to be febrile. Patient was able to give answers but was lethargic. ABG shows mild respiratory acidosis and patient was placed on BiPAP. CT of the head did not show anything acute. Patient had blood cultures drawn and was started on empiric antibiotics. On exam patient is following commands and answers questions but gets very sleepy. Patient states she has had a new oxygen tank cord which is lengthy and was not sure if she had tripped and fell on it.  HPI/Subjective: 3/17 A/O 4 sitting in chair resting currently on Ventimask, able to answer all questions appropriately. Request to go home as soon as possible.    Assessment/Plan: Acute encephalopathy -UDS negative -TSH within normal limit -Head CT negative for acute findings; cannot obtain MRI secondary to pacer  -Patient hypotensive upon admission will DC Lasix. SBP goal =140-145 -Blood culture pending -Urine was dirty with many bacteria; Urine culture pending -Patient A/O 4, negative leukocytosis, afebrile would not perform LP at this time - Seizures -EEG; positive for focal seizure see results below -Dr. Thana FarrLeslie Reynolds (neurology);  discussed findings of abnormal EEG and recommendation was to start Keppra 500 BID -If patient does not continue to improve or her encephalopathy worsens recommendation was to restart Acyclovir and perform LP -3/17 Patient has significantly improved  UTI in an elderly patient -Patient with cloudy urine and many bacteria, continue ceftriaxone, will DC acyclovir and vancomycin -Bear hugger maintain patient's temp 36.6c -Although urine negative would complete 5 day course of antibiotic given specimen was taken after patient had received several antibiotics.  Severe COPD  - continue inhalers. Continue to titrate patient O2 to maintain SPO2 89-93%; currently on Ventimask -Continue IV steroids and Pulmicort along with nebulizer.  Chronic atrial fibrillation  - Currently rate controlled  -Obtain echocardiogram  -presently rate controlled. EKG is pending. Sensation may require procedures I have held patient's Apixaban in place patient on heparin infusion for now. Patient's CHADS2VASC score is more than 2.  Chronic systolic heart failure (October 2015 was 45% )  -Patient has relative hypotension for a person of her age will cut Lasix to 20 mg daily  -Place TED hose; when patient awake, ensure patient's removes before retiring to bed.  -Obtain orthostatic vitals  Chronic kidney failure (baseline Cr 1.25-1.93) - Currently at baseline monitor carefully  Complete heart block -S/P pacemaker present  Polymorphic VT with history of prolonged QT  -Monitor telemetry closely - check EKG. Closely monitor in telemetry.  Diabetes type 2 -Obtain hemoglobin A1c -Obtain lipid panel -Lantus 8 units daily -Increase to moderate SSI   Code Status: FULL Family Communication: Spoke with [name] (Daughter) on phone Disposition Plan: Resolution cephalopathy    Consultants: -Dr. Thana FarrLeslie Reynolds (  neurology); phone consult    Procedure/Significant Events: 06/02/14 echocardiogram;- LVEF= 45%. Hypokinesis of  the apical myocardium. - Right ventricle: Hypokinesis of the RV apex. - Hypokinesis oif the entire LV and RV apex.  3/14 CT head without contrast; -Moderate atrophy and chronic microvascular disease.  -No acute findings are evident. 3/15 EEG; consistent with a potential seizure focus in the left parieto-occipital region. There was no seizure recorded on this study   Culture 3/14 blood left arm/forearm NGTD 3/14 urine negative 3/14 MRSA by PCR negative   Antibiotics: Vancomycin 3/14>> stop 3/15  Acyclovir 3/16>> stopped 3/15  Ceftriaxone 3/15>>   DVT prophylaxis:  heparin infusion   LINES / TUBES:      Continuous Infusions: . heparin 1,050 Units/hr (11/06/14 0452)    Objective: VITAL SIGNS: Temp: 97.8 F (36.6 C) (03/17 1538) Temp Source: Oral (03/17 1538) BP: 115/64 mmHg (03/17 1538) Pulse Rate: 80 (03/17 1538) SPO2; FIO2:   Intake/Output Summary (Last 24 hours) at 11/07/14 1836 Last data filed at 11/07/14 1800  Gross per 24 hour  Intake    809 ml  Output   1701 ml  Net   -892 ml     Exam: General:  Extremely interactive joking with medical staff,  A/O 4,  continued acute on chronic respiratory distress but significantly improved (on a Ventimask) Lungs: Clear to auscultation bilaterally without wheezes or crackles Cardiovascular: Regular rate and rhythm without murmur gallop or rub normal S1 and S2 Abdomen: Nontender, nondistended, soft, bowel sounds positive, no rebound, no ascites, no appreciable mass Extremities: No significant cyanosis, clubbing, or edema bilateral lower extremities Neurologic; cranial nerves II through XII intact, extremity strength 5/5, sensation intact throughout, follows all commands, Babinski within normal limit. Did not ambulate patient.    Data Reviewed: Basic Metabolic Panel:  Recent Labs Lab 11/04/14 1630 11/05/14 0345 11/07/14 0350  NA 138 142 139  K 3.9 4.2 4.4  CL 95* 97 94*  CO2 33* 37* 36*  GLUCOSE 200* 175*  189*  BUN 19 20 31*  CREATININE 1.52* 1.67* 1.47*  CALCIUM 9.3 8.9 9.0   Liver Function Tests:  Recent Labs Lab 11/04/14 1630 11/07/14 0350  AST 23 25  ALT 9 10  ALKPHOS 69 60  BILITOT 1.2 0.6  PROT 6.6 6.0  ALBUMIN 3.7 2.9*   No results for input(s): LIPASE, AMYLASE in the last 168 hours.  Recent Labs Lab 11/05/14 0725  AMMONIA 19   CBC:  Recent Labs Lab 11/04/14 1630 11/05/14 0345 11/05/14 0910 11/06/14 0124 11/07/14 0350  WBC 12.4* 9.1 7.7 7.2 9.3  NEUTROABS 10.9*  --   --   --   --   HGB 12.0 10.1* 10.4* 10.4* 10.1*  HCT 41.0 36.3 36.0 35.5* 34.9*  MCV 92.8 94.8 94.7 92.9 93.1  PLT 153 128* 104* 119* 151   Cardiac Enzymes:  Recent Labs Lab 11/05/14 0050 11/05/14 0910 11/05/14 1445 11/05/14 2006 11/06/14 0015  CKTOTAL  --  104 97  --   --   CKMB  --   --  1.3  --   --   TROPONINI 0.03 <0.03 <0.03 <0.03 <0.03   BNP (last 3 results) No results for input(s): BNP in the last 8760 hours.  ProBNP (last 3 results)  Recent Labs  12/04/13 2258 06/02/14  PROBNP 17479.0* 13998.0*    CBG:  Recent Labs Lab 11/06/14 1647 11/06/14 2121 11/07/14 0810 11/07/14 1220 11/07/14 1736  GLUCAP 242* 246* 128* 255* 216*    Recent Results (  from the past 240 hour(s))  Blood culture (routine x 2)     Status: None (Preliminary result)   Collection Time: 11/04/14  4:30 PM  Result Value Ref Range Status   Specimen Description BLOOD LEFT ARM  Final   Special Requests BOTTLES DRAWN AEROBIC AND ANAEROBIC 5CC  Final   Culture   Final           BLOOD CULTURE RECEIVED NO GROWTH TO DATE CULTURE WILL BE HELD FOR 5 DAYS BEFORE ISSUING A FINAL NEGATIVE REPORT Performed at [facility]    Report Status PENDING  Incomplete  Blood culture (routine x 2)     Status: None (Preliminary result)   Collection Time: 11/04/14  4:35 PM  Result Value Ref Range Status   Specimen Description BLOOD LEFT FOREARM  Final   Special Requests BOTTLES DRAWN AEROBIC AND ANAEROBIC  5CC  Final   Culture   Final           BLOOD CULTURE RECEIVED NO GROWTH TO DATE CULTURE WILL BE HELD FOR 5 DAYS BEFORE ISSUING A FINAL NEGATIVE REPORT Performed at [facility]    Report Status PENDING  Incomplete  Urine culture     Status: None   Collection Time: 11/04/14  7:54 PM  Result Value Ref Range Status   Specimen Description URINE, RANDOM  Final   Special Requests NONE  Final   Colony Count NO GROWTH Performed at [facility]   Final   Culture NO GROWTH Performed at [facility]   Final   Report Status 11/06/2014 FINAL  Final  MRSA PCR Screening     Status: None   Collection Time: 11/04/14 11:45 PM  Result Value Ref Range Status   MRSA by PCR NEGATIVE NEGATIVE Final    Comment:        The GeneXpert MRSA Assay (FDA approved for NASAL specimens only), is one component of a comprehensive MRSA colonization surveillance program. It is not intended to diagnose MRSA infection nor to guide or monitor treatment for MRSA infections.      Studies:  Recent x-ray studies have been reviewed in detail by the Attending Physician  Scheduled Meds:  Scheduled Meds: . antiseptic oral rinse  7 mL Mouth Rinse q12n4p  . atorvastatin  40 mg Oral q1800  . budesonide (PULMICORT) nebulizer solution  0.25 mg Nebulization BID  . cefTRIAXone (ROCEPHIN)  IV  1 g Intravenous Q24H  . chlorhexidine  15 mL Mouth Rinse BID  . furosemide  20 mg Intravenous Daily  . insulin aspart  0-9 Units Subcutaneous TID WC  . ipratropium  0.5 mg Nebulization QID  . levalbuterol  0.63 mg Nebulization QID  . levETIRAcetam  500 mg Intravenous Q12H  . linagliptin  5 mg Oral Daily  . methylPREDNISolone (SOLU-MEDROL) injection  40 mg Intravenous Daily  . montelukast  10 mg Oral QHS  . pantoprazole  40 mg Oral Daily  . PARoxetine  10 mg Oral Daily  . sodium chloride  3 mL Intravenous Q12H    Time spent on care of this patient: 40 mins   Seger Jani, Roselind Messier , MD  [facility] Office  [phone] Pager - [phone]  On-Call/Text Page:      [email or web address]      password [email or web address]  If 7PM-7AM, please contact night-coverage [email or web address] Password [facility] 11/07/2014, 6:36 PM   LOS: 3 days   Care during the described time interval was provided by me .  I have reviewed  this patient's available data, including medical history, events of note, physical examination, radiology studies and test results as part of my evaluation  Dia Crawford, MD [phone] Pager

## 2014-11-07 NOTE — Progress Notes (Signed)
Patient is on a 55%, 14L venturi mask tolerating well. Patient shows no signs of distress at this time. BIPAP not needed at this time, RT will continue to monitor.

## 2014-11-07 NOTE — Progress Notes (Signed)
11/06/2014 Patient daughter Tarry KosJoy Dalton would like the primary physician to call her concerning her mom. She would like to know diagnostic test results and the plan of care. [phone].

## 2014-11-07 NOTE — Progress Notes (Addendum)
ANTICOAGULATION CONSULT NOTE   Pharmacy Consult for heparin Indication: afib  Allergies  Allergen Reactions  . Advair Diskus [Fluticasone-Salmeterol]     Itching  . Biaxin [Clarithromycin]     N/V  . Ciprofloxacin     N/V  . Doxycycline Other (See Comments)    Inside of mouth and tongue red and peeled  . Keflex [Cephalexin]     N/V  . Ketek [Telithromycin]     N/V  . Levaquin [Levofloxacin In D5w] Other (See Comments)    Arrythmia, polymorphic Vtach on 10/15 hospitalization  . Macrobid [Nitrofurantoin]     N/V  . Penicillins     Rash  . Sulfonamide Derivatives     Patient Measurements: Height: 5\' 3"  (160 cm) Weight: 174 lb 2.6 oz (79 kg) IBW/kg (Calculated) : 52.4 Heparin Dosing Weight: 70kg  Vital Signs: Temp: 97.4 F (36.3 C) (03/17 1206) Temp Source: Oral (03/17 1206) BP: 128/60 mmHg (03/17 1206) Pulse Rate: 70 (03/17 1217)  Labs:  Recent Labs  11/04/14 1630 11/04/14 1800  11/05/14 0345  11/05/14 0910 11/05/14 1445 11/05/14 2006 11/06/14 0015 11/06/14 0124 11/06/14 0126 11/06/14 1314 11/07/14 0350  HGB 12.0  --   --  10.1*  --  10.4*  --   --   --  10.4*  --   --  10.1*  HCT 41.0  --   --  36.3  --  36.0  --   --   --  35.5*  --   --  34.9*  PLT 153  --   --  128*  --  104*  --   --   --  119*  --   --  151  APTT  --   --   --   --   < > 58*  --  82*  --  116*  --  96* 81*  LABPROT  --  16.1*  --   --   --   --   --   --   --   --   --   --   --   INR  --  1.27  --   --   --   --   --   --   --   --   --   --   --   HEPARINUNFRC  --   --   --   --   --  1.02*  --   --   --   --  1.06*  --  0.66  CREATININE 1.52*  --   --  1.67*  --   --   --   --   --   --   --   --  1.47*  CKTOTAL  --   --   --   --   --  104 97  --   --   --   --   --   --   CKMB  --   --   --   --   --   --  1.3  --   --   --   --   --   --   TROPONINI  --   --   < >  --   --  <0.03 <0.03 <0.03 <0.03  --   --   --   --   < > = values in this interval not displayed.  Estimated  Creatinine Clearance: 30.4 mL/min (by C-G formula based  on Cr of 1.47).   Assessment: 81 yo female on Eliquis PTA (last dose 3/13) and bridging with heparin for afib/flutter. aPTT 81 and heparin level 0.66 (now correlating and therapeutic) so will use only heparin level from now on for heparin monitoring. Hgb low but stable, plt back to nl. No bleeding noted.  Goal of Therapy:  Heparin level 0.3-0.7 units/ml Monitor platelets by anticoagulation protocol: Yes   Plan:  Continue heparin at 1050 units/hr Daily heparin level and CBC F/u plans to restart home apixaban if no procedures needed  Christoper Fabian, PharmD, BCPS Clinical pharmacist, pager [phone] 11/07/2014,1:38 PM

## 2014-11-07 NOTE — Progress Notes (Signed)
SAT 100% on NRB,  PLACED ON 55%  VM, sat 93%

## 2014-11-07 NOTE — Progress Notes (Signed)
When Rt entered room, pt sat 78% on 55% VM. RT gave tx with 6l [place] and Nebulizer, then  placed pt on 100% NRB, sat 94%. RN aware.

## 2014-11-08 LAB — GLUCOSE, CAPILLARY
GLUCOSE-CAPILLARY: 194 mg/dL — AB (ref 70–99)
GLUCOSE-CAPILLARY: 290 mg/dL — AB (ref 70–99)
Glucose-Capillary: 130 mg/dL — ABNORMAL HIGH (ref 70–99)
Glucose-Capillary: 143 mg/dL — ABNORMAL HIGH (ref 70–99)
Glucose-Capillary: 168 mg/dL — ABNORMAL HIGH (ref 70–99)
Glucose-Capillary: 351 mg/dL — ABNORMAL HIGH (ref 70–99)

## 2014-11-08 LAB — CBC
HCT: 33.7 % — ABNORMAL LOW (ref 36.0–46.0)
Hemoglobin: 10 g/dL — ABNORMAL LOW (ref 12.0–15.0)
MCH: 27 pg (ref 26.0–34.0)
MCHC: 29.7 g/dL — ABNORMAL LOW (ref 30.0–36.0)
MCV: 91.1 fL (ref 78.0–100.0)
Platelets: 158 10*3/uL (ref 150–400)
RBC: 3.7 MIL/uL — ABNORMAL LOW (ref 3.87–5.11)
RDW: 14.4 % (ref 11.5–15.5)
WBC: 8.9 10*3/uL (ref 4.0–10.5)

## 2014-11-08 LAB — HEPARIN LEVEL (UNFRACTIONATED): Heparin Unfractionated: 0.36 IU/mL (ref 0.30–0.70)

## 2014-11-08 MED ORDER — FUROSEMIDE 20 MG PO TABS
20.0000 mg | ORAL_TABLET | Freq: Every day | ORAL | Status: DC
  Administered 2014-11-09 – 2014-11-11 (×3): 20 mg via ORAL
  Filled 2014-11-08 (×3): qty 1

## 2014-11-08 MED ORDER — HEPARIN (PORCINE) IN NACL 100-0.45 UNIT/ML-% IJ SOLN: 1050.0000 [IU]/h | INTRAMUSCULAR | Status: DC

## 2014-11-08 MED ORDER — APIXABAN 2.5 MG PO TABS
2.5000 mg | ORAL_TABLET | Freq: Two times a day (BID) | ORAL | Status: DC
  Administered 2014-11-08 – 2014-11-12 (×8): 2.5 mg via ORAL
  Filled 2014-11-08 (×9): qty 1

## 2014-11-08 MED ORDER — LEVETIRACETAM 500 MG PO TABS
500.0000 mg | ORAL_TABLET | Freq: Two times a day (BID) | ORAL | Status: DC
  Administered 2014-11-09 – 2014-11-12 (×7): 500 mg via ORAL
  Filled 2014-11-08 (×8): qty 1

## 2014-11-08 MED ORDER — PREDNISONE 10 MG PO TABS
10.0000 mg | ORAL_TABLET | Freq: Every day | ORAL | Status: DC
  Administered 2014-11-09 – 2014-11-12 (×4): 10 mg via ORAL
  Filled 2014-11-08 (×7): qty 1

## 2014-11-08 MED ORDER — INSULIN ASPART 100 UNIT/ML ~~LOC~~ SOLN
0.0000 [IU] | Freq: Three times a day (TID) | SUBCUTANEOUS | Status: DC
  Administered 2014-11-09: 5 [IU] via SUBCUTANEOUS
  Administered 2014-11-09: 3 [IU] via SUBCUTANEOUS
  Administered 2014-11-09: 2 [IU] via SUBCUTANEOUS
  Administered 2014-11-10 (×3): 3 [IU] via SUBCUTANEOUS
  Administered 2014-11-11: 2 [IU] via SUBCUTANEOUS
  Administered 2014-11-11: 5 [IU] via SUBCUTANEOUS
  Administered 2014-11-11: 3 [IU] via SUBCUTANEOUS
  Administered 2014-11-12: 2 [IU] via SUBCUTANEOUS
  Administered 2014-11-12: 5 [IU] via SUBCUTANEOUS

## 2014-11-08 NOTE — Progress Notes (Signed)
Received patient on VM with an O2 saturation of 83.  Patient is asleep but arousable.  VM changed to NRB.  O2 sats is currently 94%.  Will continue to monitor.

## 2014-11-08 NOTE — Progress Notes (Signed)
[place] TEAM 1 - Stepdown/ICU TEAM Progress Note  [name] MarvelGertrude S [name] ZOX:[id number] DOB: [birth year] DOA: 11/04/2014 PCP: [name],[name], MD  Admit HPI / Brief Narrative: 81 y.o. F HX  severe COPD on 5 L home oxygen, chronic atrial fibrillation, complete heart block status post pacemaker, chronic systolic heart failure with EF 45% in October 2015, diabetes mellitus type 2, hyperlipidemia, and DVT who was brought to the ER after patient was found unconscious in her house by patient's family. Within a few minutes patient regained consciousness but was confused. Patient was brought to the ER. Patient was found to be febrile. Patient was able to give answers but was lethargic. ABG showed mild respiratory acidosis and patient was placed on BiPAP. CT of the head did not show anything acute. Patient had blood cultures drawn and was started on empiric antibiotics. On exam patient is following commands and answers questions but gets very sleepy. Patient stated she had a new oxygen tank cord which is lengthy and was not sure if she had tripped and fell on it.  HPI/Subjective: Pt is fully awake and oriented today.  She is quite anxious to go home, but she is still requiring face mask O2 support.  She is usually on 5L [place] at home.  She denies cp, n/v, abdom pain, or current SOB.  She is quite angry, stating that her lower denture plate was lodged in the back of her throat when she arrived in the ED and after she was put on BIPAP, and was not discovered until she was taken off BIPAP the next day, at which time she pulled them out herself.  Of note, I see no evidence of dentures on her CT head/cervical spine at the time of her admission.    Assessment/Plan:  Acute encephalopathy -UDS negative - TSH normal - Head CT negative for acute findings; cannot obtain MRI secondary to pacer - urine culture no growth - suspect this was all post-ictal state - is currently at per baseline mental status   Acute on chronic  hypoxic and hypercapnic resp failure Wean oxygen as able - no evidence of respiratory distress today - goal is to get back to 5LPM at all times - requiring face mask today   Seizures -EEG positive for focal seizure - Neurology recommended Keppra 500 BID - tolerating Keppra w/ no evidence of ongoing seizure activity   UTI -Patient with cloudy urine and many bacteria - culture not helpful - has completed 5 days of abx tx   Severe COPD   -continue outpt med regimen    Chronic atrial fibrillation  -rate controlled - resume oral anticoag   Chronic systolic heart failure (October 2015 EF 45%)  -no clinical evidence of signif volume overload at this time - follow trends in wgt and Is/Os - wgt stable at ~79kg  Chronic kidney failure (baseline Cr 1.25 - 1.93) -Currently at baseline   Complete heart block -S/P pacemaker  Polymorphic VT with history of prolonged QT  -Monitor telemetry closely  DM2 Reasonably controlled but not ideal - follow w/o change for now   Code Status: FULL Family Communication: spoke w/ daughter at bedside  Disposition Plan: SDU until O2 requirements improved   Consultants: Neurology   Procedure/Significant Events: 3/14 CT head without contrast - Moderate atrophy and chronic microvascular disease. -No acute findings are evident. 3/15 EEG consistent with a potential seizure focus in the left parieto-occipital region. There was no seizure recorded on this study  Antibiotics: Vancomycin 3/14 Cefepime  3/14  Acyclovir 3/14  Ceftriaxone 3/14 > 3/18  DVT prophylaxis: heparin IV > eliquis  Objective: Blood pressure 139/61, pulse 71, temperature 97.4 F (36.3 C), temperature source Oral, resp. rate 23, height  (1.6 m), weight 79.289 kg (174 lb 12.8 oz), SpO2 99 %.  Intake/Output Summary (Last 24 hours) at 11/08/14 1512 Last data filed at 11/08/14 1126  Gross per 24 hour  Intake  479.6 ml  Output    900 ml  Net -420.4 ml   Exam: General:  No acute  respiratory distress - alert and conversant  Lungs: Clear to auscultation bilaterally without wheezes or crackles - distant BS all fields  Cardiovascular: Regular rate without murmur gallop or rub  Abdomen: Nontender, nondistended, soft, bowel sounds positive, no rebound, no ascites, no appreciable mass Extremities: No significant cyanosis, clubbing, or edema bilateral lower extremities  Data Reviewed: Basic Metabolic Panel:  Recent Labs Lab 11/04/14 1630 11/05/14 0345 11/07/14 0350  NA 138 142 139  K 3.9 4.2 4.4  CL 95* 97 94*  CO2 33* 37* 36*  GLUCOSE 200* 175* 189*  BUN 19 20 31*  CREATININE 1.52* 1.67* 1.47*  CALCIUM 9.3 8.9 9.0   Liver Function Tests:  Recent Labs Lab 11/04/14 1630 11/07/14 0350  AST 23 25  ALT 9 10  ALKPHOS 69 60  BILITOT 1.2 0.6  PROT 6.6 6.0  ALBUMIN 3.7 2.9*    Recent Labs Lab 11/05/14 0725  AMMONIA 19   CBC:  Recent Labs Lab 11/04/14 1630 11/05/14 0345 11/05/14 0910 11/06/14 0124 11/07/14 0350 11/08/14 0305  WBC 12.4* 9.1 7.7 7.2 9.3 8.9  NEUTROABS 10.9*  --   --   --   --   --   HGB 12.0 10.1* 10.4* 10.4* 10.1* 10.0*  HCT 41.0 36.3 36.0 35.5* 34.9* 33.7*  MCV 92.8 94.8 94.7 92.9 93.1 91.1  PLT 153 128* 104* 119* 151 158   Cardiac Enzymes:  Recent Labs Lab 11/05/14 0050 11/05/14 0910 11/05/14 1445 11/05/14 2006 11/06/14 0015  CKTOTAL  --  104 97  --   --   CKMB  --   --  1.3  --   --   TROPONINI 0.03 <0.03 <0.03 <0.03 <0.03    CBG:  Recent Labs Lab 11/07/14 2215 11/08/14 0047 11/08/14 0505 11/08/14 0753 11/08/14 1125  GLUCAP 206* 168* 143* 130* 194*    Recent Results (from the past 240 hour(s))  Blood culture (routine x 2)     Status: None (Preliminary result)   Collection Time: 11/04/14  4:30 PM  Result Value Ref Range Status   Specimen Description BLOOD LEFT ARM  Final   Special Requests BOTTLES DRAWN AEROBIC AND ANAEROBIC 5CC  Final   Culture   Final           BLOOD CULTURE RECEIVED NO GROWTH TO  DATE CULTURE WILL BE HELD FOR 5 DAYS BEFORE ISSUING A FINAL NEGATIVE REPORT Performed at [facility]    Report Status PENDING  Incomplete  Blood culture (routine x 2)     Status: None (Preliminary result)   Collection Time: 11/04/14  4:35 PM  Result Value Ref Range Status   Specimen Description BLOOD LEFT FOREARM  Final   Special Requests BOTTLES DRAWN AEROBIC AND ANAEROBIC 5CC  Final   Culture   Final           BLOOD CULTURE RECEIVED NO GROWTH TO DATE CULTURE WILL BE HELD FOR 5 DAYS BEFORE ISSUING A FINAL NEGATIVE  REPORT Performed at [facility]    Report Status PENDING  Incomplete  Urine culture     Status: None   Collection Time: 11/04/14  7:54 PM  Result Value Ref Range Status   Specimen Description URINE, RANDOM  Final   Special Requests NONE  Final   Colony Count NO GROWTH Performed at [facility]   Final   Culture NO GROWTH Performed at [facility]   Final   Report Status 11/06/2014 FINAL  Final  MRSA PCR Screening     Status: None   Collection Time: 11/04/14 11:45 PM  Result Value Ref Range Status   MRSA by PCR NEGATIVE NEGATIVE Final    Comment:        The GeneXpert MRSA Assay (FDA approved for NASAL specimens only), is one component of a comprehensive MRSA colonization surveillance program. It is not intended to diagnose MRSA infection nor to guide or monitor treatment for MRSA infections.      Studies:  Recent x-ray studies have been reviewed in detail by the Attending Physician  Scheduled Meds:  Scheduled Meds: . antiseptic oral rinse  7 mL Mouth Rinse q12n4p  . atorvastatin  40 mg Oral q1800  . budesonide (PULMICORT) nebulizer solution  0.25 mg Nebulization BID  . cefTRIAXone (ROCEPHIN)  IV  1 g Intravenous Q24H  . chlorhexidine  15 mL Mouth Rinse BID  . furosemide  20 mg Intravenous Daily  . insulin aspart  0-15 Units Subcutaneous 6 times per day  . insulin glargine  8 Units Subcutaneous QHS  . ipratropium   0.5 mg Nebulization QID  . levalbuterol  0.63 mg Nebulization QID  . levETIRAcetam  500 mg Intravenous Q12H  . linagliptin  5 mg Oral Daily  . methylPREDNISolone (SOLU-MEDROL) injection  40 mg Intravenous Daily  . montelukast  10 mg Oral QHS  . pantoprazole  40 mg Oral Daily  . PARoxetine  10 mg Oral Daily  . sodium chloride  3 mL Intravenous Q12H    Time spent on care of this patient: 35 mins  Lonia Blood, MD [facility] For Consults/Admissions - Flow Manager - [phone] Office  [phone]  Contact MD directly via text page:      [email or web address]      password [facility]  11/08/2014, 3:12 PM   LOS: 4 days

## 2014-11-08 NOTE — Progress Notes (Addendum)
ANTICOAGULATION CONSULT NOTE   Pharmacy Consult for heparin Indication: afib  Allergies  Allergen Reactions  . Advair Diskus [Fluticasone-Salmeterol]     Itching  . Biaxin [Clarithromycin]     N/V  . Ciprofloxacin     N/V  . Doxycycline Other (See Comments)    Inside of mouth and tongue red and peeled  . Keflex [Cephalexin]     N/V  . Ketek [Telithromycin]     N/V  . Levaquin [Levofloxacin In D5w] Other (See Comments)    Arrythmia, polymorphic Vtach on 10/15 hospitalization  . Macrobid [Nitrofurantoin]     N/V  . Penicillins     Rash  . Sulfonamide Derivatives     Patient Measurements: Height: 5\' 3"  (160 cm) Weight: 174 lb 12.8 oz (79.289 kg) IBW/kg (Calculated) : 52.4 Heparin Dosing Weight: 70kg  Vital Signs: Temp: 97.4 F (36.3 C) (03/18 1123) Temp Source: Oral (03/18 1123) BP: 139/61 mmHg (03/18 1123) Pulse Rate: 71 (03/18 1123)  Labs:  Recent Labs  11/05/14 1445  11/05/14 2006 11/06/14 0015  11/06/14 0124 11/06/14 0126 11/06/14 1314 11/07/14 0350 11/08/14 0305  HGB  --   --   --   --   < > 10.4*  --   --  10.1* 10.0*  HCT  --   --   --   --   --  35.5*  --   --  34.9* 33.7*  PLT  --   --   --   --   --  119*  --   --  151 158  APTT  --   < > 82*  --   --  116*  --  96* 81*  --   HEPARINUNFRC  --   --   --   --   --   --  1.06*  --  0.66 0.36  CREATININE  --   --   --   --   --   --   --   --  1.47*  --   CKTOTAL 97  --   --   --   --   --   --   --   --   --   CKMB 1.3  --   --   --   --   --   --   --   --   --   TROPONINI <0.03  --  <0.03 <0.03  --   --   --   --   --   --   < > = values in this interval not displayed.  Estimated Creatinine Clearance: 30.5 mL/min (by C-G formula based on Cr of 1.47).   Assessment: 81 yo female on Eliquis PTA (last dose 3/13) and bridging with heparin for afib/flutter.   Heparin level is therapeutic on 1050 units/hr.  H/H low but stable. Plts improved. No bleeding reported.   Goal of Therapy:  Heparin  level 0.3-0.7 units/ml Monitor platelets by anticoagulation protocol: Yes   Plan:  Continue heparin at 1050 units/hr Daily heparin level and CBC F/u plans to restart home apixaban if no procedures needed  Link SnufferJessica Millen, PharmD, BCPS Clinical Pharmacist [phone]  11/08/2014,1:26 PM    Addendum: Consulted to resume apixaban. Patient took 2.5 mg PO bid PTA which is reasonable for age and SCr that is almost at 1.5.  Plan: D/c heparin drip at the time of administration of apixaban Apixaban 2.5 mg PO bid  [facility]oscopy CenterJennifer [place], ForsythPharm.D., BCPS Clinical Pharmacist  Pager: [phone] 11/08/2014 7:10 PM

## 2014-11-09 LAB — BASIC METABOLIC PANEL
ANION GAP: 7 (ref 5–15)
BUN: 26 mg/dL — AB (ref 6–23)
CHLORIDE: 96 mmol/L (ref 96–112)
CO2: 36 mmol/L — AB (ref 19–32)
CREATININE: 1.47 mg/dL — AB (ref 0.50–1.10)
Calcium: 9 mg/dL (ref 8.4–10.5)
GFR, EST AFRICAN AMERICAN: 38 mL/min — AB (ref >90)
GFR, EST NON AFRICAN AMERICAN: 32 mL/min — AB (ref >90)
Glucose, Bld: 213 mg/dL — ABNORMAL HIGH (ref 70–99)
Potassium: 4.3 mmol/L (ref 3.5–5.1)
Sodium: 139 mmol/L (ref 135–145)

## 2014-11-09 LAB — CBC
HCT: 34.2 % — ABNORMAL LOW (ref 36.0–46.0)
HEMOGLOBIN: 10 g/dL — AB (ref 12.0–15.0)
MCH: 27 pg (ref 26.0–34.0)
MCHC: 29.2 g/dL — AB (ref 30.0–36.0)
MCV: 92.2 fL (ref 78.0–100.0)
PLATELETS: 161 10*3/uL (ref 150–400)
RBC: 3.71 MIL/uL — ABNORMAL LOW (ref 3.87–5.11)
RDW: 14.6 % (ref 11.5–15.5)
WBC: 8.1 10*3/uL (ref 4.0–10.5)

## 2014-11-09 LAB — GLUCOSE, CAPILLARY
GLUCOSE-CAPILLARY: 142 mg/dL — AB (ref 70–99)
Glucose-Capillary: 183 mg/dL — ABNORMAL HIGH (ref 70–99)
Glucose-Capillary: 217 mg/dL — ABNORMAL HIGH (ref 70–99)
Glucose-Capillary: 271 mg/dL — ABNORMAL HIGH (ref 70–99)

## 2014-11-09 LAB — APTT: APTT: 28 s (ref 24–37)

## 2014-11-09 MED ORDER — METFORMIN HCL ER 500 MG PO TB24: 500.0000 mg | ORAL_TABLET | Freq: Every evening | ORAL | Status: DC

## 2014-11-09 MED ORDER — IPRATROPIUM BROMIDE 0.02 % IN SOLN
0.5000 mg | Freq: Three times a day (TID) | RESPIRATORY_TRACT | Status: DC
  Administered 2014-11-09 – 2014-11-10 (×3): 0.5 mg via RESPIRATORY_TRACT
  Filled 2014-11-09 (×3): qty 2.5

## 2014-11-09 MED ORDER — INSULIN GLARGINE 100 UNIT/ML ~~LOC~~ SOLN
10.0000 [IU] | Freq: Every day | SUBCUTANEOUS | Status: DC
Start: 2014-11-09 — End: 2014-11-10
  Administered 2014-11-09: 10 [IU] via SUBCUTANEOUS
  Filled 2014-11-09 (×2): qty 0.1

## 2014-11-09 MED ORDER — HEPARIN (PORCINE) IN NACL 100-0.45 UNIT/ML-% IJ SOLN: 1050.0000 [IU]/h | INTRAMUSCULAR | Status: DC

## 2014-11-09 MED ORDER — LEVALBUTEROL HCL 0.63 MG/3ML IN NEBU
0.6300 mg | INHALATION_SOLUTION | Freq: Three times a day (TID) | RESPIRATORY_TRACT | Status: DC
Start: 2014-11-09 — End: 2014-11-12
  Administered 2014-11-09 – 2014-11-12 (×8): 0.63 mg via RESPIRATORY_TRACT
  Filled 2014-11-09 (×18): qty 3

## 2014-11-09 NOTE — Progress Notes (Signed)
Tried pt on 55% venti  sats dropping to 89  So placed back on partial rebreather

## 2014-11-09 NOTE — Progress Notes (Signed)
[place] TEAM 1 - Stepdown/ICU TEAM Progress Note  [name] AVW:[id number] DOB: Jul 22, 1932 DOA: 11/04/2014 PCP: Nadean Corwin, MD  Admit HPI / Brief Narrative: 81 y.o. F HX  severe COPD on 5 L home oxygen, chronic atrial fibrillation, complete heart block status post pacemaker, chronic systolic heart failure with EF 45% in October 2015, diabetes mellitus type 2, hyperlipidemia, and DVT who was brought to the ER after patient was found unconscious in her house by patient's family. Within a few minutes patient regained consciousness but was confused. Patient was brought to the ER. Patient was found to be febrile. Patient was able to give answers but was lethargic. ABG showed mild respiratory acidosis and patient was placed on BiPAP. CT of the head did not show anything acute. Patient had blood cultures drawn and was started on empiric antibiotics. On exam patient is following commands and answers questions but gets very sleepy. Patient stated she had a new oxygen tank cord which is lengthy and was not sure if she had tripped and fell on it.  HPI/Subjective: Pt remains awake and w/ no complaints. She has not yet been weaned to [facility].  She denies cp, n/v, sob, or HA.   Assessment/Plan:  Acute encephalopathy -UDS negative - TSH normal - Head CT negative for acute findings; cannot obtain MRI secondary to pacer - urine culture no growth - suspect this was all post-ictal state - is currently at per baseline mental status   Acute on chronic hypoxic and hypercapnic resp failure Place on 5L [place] today and follow tonight - no evidence of respiratory distress today - goal is to get back to 5LPM at all times   Seizures -EEG positive for focal seizure - Neurology recommended Keppra 500 BID - tolerating Keppra w/ no evidence of ongoing seizure activity   UTI -Patient with cloudy urine and many bacteria - culture not helpful - has completed 5 days of abx tx - currently asymptomatic   Severe COPD    -continue outpt med regimen - continue O2 support as noted above   Chronic atrial fibrillation  -rate controlled - resumed oral anticoag   Chronic systolic heart failure (October 2015 EF 45%)  -no clinical evidence of signif volume overload at this time - follow trends in wgt and Is/Os - wgt stable at ~79kg  Chronic kidney failure (baseline Cr 1.25 - 1.93) -Currently at baseline   Complete heart block -S/P pacemaker  Polymorphic VT with history of prolonged QT  -Monitor telemetry closely  DM2 -Reasonably controlled but not ideal - adjust tx slightly and follow   Code Status: FULL Family Communication: no family present at time of exam today   Disposition Plan: SDU until O2 requirements improved   Consultants: Neurology   Procedure/Significant Events: 3/14 CT head without contrast - Moderate atrophy and chronic microvascular disease. -No acute findings are evident. 3/15 EEG consistent with a potential seizure focus in the left parieto-occipital region. There was no seizure recorded on this study  Antibiotics: Vancomycin 3/14 Cefepime 3/14  Acyclovir 3/14  Ceftriaxone 3/14 > 3/18  DVT prophylaxis: eliquis  Objective: Blood pressure 120/99, pulse 70, temperature 97.8 F (36.6 C), temperature source Axillary, resp. rate 20, height  (1.6 m), weight 78.699 kg (173 lb 8 oz), SpO2 99 %.  Intake/Output Summary (Last 24 hours) at 11/09/14 1613 Last data filed at 11/09/14 0800  Gross per 24 hour  Intake     50 ml  Output      0  ml  Net     50 ml   Exam: General:  No acute respiratory distress - alert and conversant  Lungs: Clear to auscultation bilaterally without wheezes or crackles - distant BS all fields  Cardiovascular: Regular rate without murmur gallop or rub  Abdomen: Nontender, nondistended, soft, bowel sounds positive, no rebound, no ascites, no appreciable mass Extremities: No significant cyanosis, clubbing, edema bilateral lower extremities  Data  Reviewed: Basic Metabolic Panel:  Recent Labs Lab 11/04/14 1630 11/05/14 0345 11/07/14 0350 11/09/14 0323  NA 138 142 139 139  K 3.9 4.2 4.4 4.3  CL 95* 97 94* 96  CO2 33* 37* 36* 36*  GLUCOSE 200* 175* 189* 213*  BUN 19 20 31* 26*  CREATININE 1.52* 1.67* 1.47* 1.47*  CALCIUM 9.3 8.9 9.0 9.0   Liver Function Tests:  Recent Labs Lab 11/04/14 1630 11/07/14 0350  AST 23 25  ALT 9 10  ALKPHOS 69 60  BILITOT 1.2 0.6  PROT 6.6 6.0  ALBUMIN 3.7 2.9*    Recent Labs Lab 11/05/14 0725  AMMONIA 19   CBC:  Recent Labs Lab 11/04/14 1630  11/05/14 0910 11/06/14 0124 11/07/14 0350 11/08/14 0305 11/09/14 0323  WBC 12.4*  < > 7.7 7.2 9.3 8.9 8.1  NEUTROABS 10.9*  --   --   --   --   --   --   HGB 12.0  < > 10.4* 10.4* 10.1* 10.0* 10.0*  HCT 41.0  < > 36.0 35.5* 34.9* 33.7* 34.2*  MCV 92.8  < > 94.7 92.9 93.1 91.1 92.2  PLT 153  < > 104* 119* 151 158 161  < > = values in this interval not displayed. Cardiac Enzymes:  Recent Labs Lab 11/05/14 0050 11/05/14 0910 11/05/14 1445 11/05/14 2006 11/06/14 0015  CKTOTAL  --  104 97  --   --   CKMB  --   --  1.3  --   --   TROPONINI 0.03 <0.03 <0.03 <0.03 <0.03    CBG:  Recent Labs Lab 11/08/14 1817 11/09/14 0013 11/09/14 0739 11/09/14 1154 11/09/14 1550  GLUCAP 351* 271* 142* 183* 217*    Recent Results (from the past 240 hour(s))  Blood culture (routine x 2)     Status: None (Preliminary result)   Collection Time: 11/04/14  4:30 PM  Result Value Ref Range Status   Specimen Description BLOOD LEFT ARM  Final   Special Requests BOTTLES DRAWN AEROBIC AND ANAEROBIC 5CC  Final   Culture   Final           BLOOD CULTURE RECEIVED NO GROWTH TO DATE CULTURE WILL BE HELD FOR 5 DAYS BEFORE ISSUING A FINAL NEGATIVE REPORT Performed at [facility]    Report Status PENDING  Incomplete  Blood culture (routine x 2)     Status: None (Preliminary result)   Collection Time: 11/04/14  4:35 PM  Result Value Ref  Range Status   Specimen Description BLOOD LEFT FOREARM  Final   Special Requests BOTTLES DRAWN AEROBIC AND ANAEROBIC 5CC  Final   Culture   Final           BLOOD CULTURE RECEIVED NO GROWTH TO DATE CULTURE WILL BE HELD FOR 5 DAYS BEFORE ISSUING A FINAL NEGATIVE REPORT Performed at [facility]    Report Status PENDING  Incomplete  Urine culture     Status: None   Collection Time: 11/04/14  7:54 PM  Result Value Ref Range Status   Specimen  Description URINE, RANDOM  Final   Special Requests NONE  Final   Colony Count NO GROWTH [facility]   Final   Culture NO GROWTH [facility] at [facility]   Final   Report Status 11/06/2014 FINAL  Final  MRSA PCR Screening     Status: None   Collection Time: 11/04/14 11:45 PM  Result Value Ref Range Status   MRSA by PCR NEGATIVE NEGATIVE Final    Comment:        The GeneXpert MRSA Assay (FDA approved for NASAL specimens only), is one component of a comprehensive MRSA colonization surveillance program. It is not intended to diagnose MRSA infection nor to guide or monitor treatment for MRSA infections.      Studies:  Recent x-ray studies have been reviewed in detail by the Attending Physician  Scheduled Meds:  Scheduled Meds: . antiseptic oral rinse  7 mL Mouth Rinse q12n4p  . apixaban  2.5 mg Oral BID  . atorvastatin  40 mg Oral q1800  . budesonide (PULMICORT) nebulizer solution  0.25 mg Nebulization BID  . chlorhexidine  15 mL Mouth Rinse BID  . furosemide  20 mg Oral Daily  . insulin aspart  0-15 Units Subcutaneous TID WC  . insulin glargine  8 Units Subcutaneous QHS  . ipratropium  0.5 mg Nebulization TID  . levalbuterol  0.63 mg Nebulization TID  . levETIRAcetam  500 mg Oral BID  . linagliptin  5 mg Oral Daily  . montelukast  10 mg Oral QHS  . pantoprazole  40 mg Oral Daily  . PARoxetine  10 mg Oral Daily  . predniSONE  10 mg Oral Q breakfast  . sodium chloride  3 mL Intravenous Q12H     Time spent on care of this patient: 25 mins  Lonia BloodJeffrey T. Caela Huot, MD [facility] For Consults/Admissions - Flow Manager - [phone] Office  [phone]  Contact MD directly via text page:      [email or web address]      password [email or web address]  11/09/2014, 4:13 PM   LOS: 5 days

## 2014-11-09 NOTE — Progress Notes (Signed)
PHARMACIST - PHYSICIAN COMMUNICATION DR:  Sharon SellerMcClung CONCERNING:  METFORMIN SAFE ADMINISTRATION POLICY  RECOMMENDATION: Metformin has been placed on DISCONTINUE (rejected order) STATUS and should be reordered only after any of the conditions below are ruled out.  Current safety recommendations include avoiding metformin for a minimum of 48 hours after the patient's exposure to intravenous contrast media.  DESCRIPTION:  The Pharmacy Committee has adopted a policy that restricts the use of metformin in hospitalized patients until all the contraindications to administration have been ruled out. Specific contraindications are: []  Serum creatinine ? 1.5 for males [x]  Serum creatinine ? 1.4 for females []  Shock, acute MI, sepsis, hypoxemia, dehydration []  Planned administration of intravenous iodinated contrast media []  Heart Failure patients with low EF []  Acute or chronic metabolic acidosis (including DKA)     Celedonio MiyamotoJeremy Donata Reddick, PharmD, BCPS Clinical Pharmacist Pager [phone]

## 2014-11-09 NOTE — Progress Notes (Signed)
ANTICOAGULATION CONSULT NOTE - Follow Up Consult  Pharmacy Consult for Eliquis Indication: Afib  Allergies  Allergen Reactions  . Advair Diskus [Fluticasone-Salmeterol]     Itching  . Biaxin [Clarithromycin]     N/V  . Ciprofloxacin     N/V  . Doxycycline Other (See Comments)    Inside of mouth and tongue red and peeled  . Keflex [Cephalexin]     N/V  . Ketek [Telithromycin]     N/V  . Levaquin [Levofloxacin In D5w] Other (See Comments)    Arrythmia, polymorphic Vtach on 10/15 hospitalization  . Macrobid [Nitrofurantoin]     N/V  . Penicillins     Rash  . Sulfonamide Derivatives     Patient Measurements: Height: 5\' 3"  (160 cm) Weight: 173 lb 8 oz (78.699 kg) IBW/kg (Calculated) : 52.4  Vital Signs: Temp: 97.8 F (36.6 C) (03/19 0736) Temp Source: Axillary (03/19 0736) BP: 138/76 mmHg (03/19 0736) Pulse Rate: 70 (03/19 0736)  Labs:  Recent Labs  11/06/14 1314  11/07/14 0350 11/08/14 0305 11/09/14 0323  HGB  --   < > 10.1* 10.0* 10.0*  HCT  --   --  34.9* 33.7* 34.2*  PLT  --   --  151 158 161  APTT 96*  --  81*  --  28  HEPARINUNFRC  --   --  0.66 0.36  --   CREATININE  --   --  1.47*  --  1.47*  < > = values in this interval not displayed.  Estimated Creatinine Clearance: 30.3 mL/min (by C-G formula based on Cr of 1.47).   Assessment: 81 yo female on Eliquis PTA (last dose 3/13) and bridging with heparin for afib/flutter. Pharmacy to re-start PTA Eliquis. SCr 1.47, CrCl ~ 7030ml/min. Hgb low but stable, plt wnl. No bleeding noted.  Goal of Therapy:  Monitor platelets by anticoagulation protocol: Yes   Plan:  Continue apixaban 2.5mg  PO BID Monitor CBC, s/s of bleed  Revella Shelton J 11/09/2014,10:29 AM

## 2014-11-10 LAB — GLUCOSE, CAPILLARY
GLUCOSE-CAPILLARY: 170 mg/dL — AB (ref 70–99)
Glucose-Capillary: 226 mg/dL — ABNORMAL HIGH (ref 70–99)

## 2014-11-10 MED ORDER — TIOTROPIUM BROMIDE MONOHYDRATE 18 MCG IN CAPS
18.0000 ug | ORAL_CAPSULE | Freq: Every day | RESPIRATORY_TRACT | Status: DC
  Administered 2014-11-11 – 2014-11-12 (×2): 18 ug via RESPIRATORY_TRACT
  Filled 2014-11-10: qty 5

## 2014-11-10 MED ORDER — INSULIN GLARGINE 100 UNIT/ML ~~LOC~~ SOLN
14.0000 [IU] | Freq: Every day | SUBCUTANEOUS | Status: DC
  Administered 2014-11-11 (×2): 14 [IU] via SUBCUTANEOUS
  Filled 2014-11-10 (×3): qty 0.14

## 2014-11-10 NOTE — Progress Notes (Signed)
Called 5W to give report at 16:35. RN will return my call.

## 2014-11-10 NOTE — Progress Notes (Signed)
Pt transferred to 5W @ 17:40. Pt alternates between n/C 5L and Venti Mask @ 14L to maintain Sats >88% per order. Daughter, Darel HongJudy visiting at time of transfer.

## 2014-11-10 NOTE — Progress Notes (Signed)
[place] TEAM 1 - Stepdown/ICU TEAM Progress Note  [name] BJY:[id number] DOB: 1932/07/04 DOA: 11/04/2014 PCP: Nadean Corwin, MD  Admit HPI / Brief Narrative: 81 y.o. F HX  severe COPD on 5 L home oxygen, chronic atrial fibrillation, complete heart block status post pacemaker, chronic systolic heart failure with EF 45% in October 2015, diabetes mellitus type 2, hyperlipidemia, and DVT who was brought to the ER after patient was found unconscious in her house by patient's family. Within a few minutes patient regained consciousness but was confused. Patient was brought to the ER. Patient was found to be febrile. Patient was able to give answers but was lethargic. ABG showed mild respiratory acidosis and patient was placed on BiPAP. CT of the head did not show anything acute. Patient had blood cultures drawn and was started on empiric antibiotics. On exam patient is following commands and answers questions but gets very sleepy. Patient stated she had a new oxygen tank cord which is lengthy and was not sure if she had tripped and fell on it.  HPI/Subjective: Pt is doing well currently.  She has not attempted ambulation to any significant extent thus far.  She required a return to venturi mask last night, but was able to return to the [place] O2 this afternoon.  She denies any new complaints.    Assessment/Plan:  Acute encephalopathy - resolved -UDS negative - TSH normal - Head CT negative for acute findings; cannot obtain MRI secondary to pacer - urine culture no growth - suspect this was all post-ictal state - is currently at per baseline mental status   Acute on chronic hypoxic and hypercapnic resp failure Failed wean to [place] only late last night, but now back on [place] and doing well - no evidence of respiratory distress presently - goal is to get back to 5LPM at all times before d/c home   Seizures - new onset -EEG positive for focal seizure - Neurology recommended Keppra 500 BID -  tolerating Keppra w/ no evidence of ongoing seizure activity   UTI -Patient with cloudy urine and many bacteria - culture was not helpful - has completed 5 days of abx tx - currently asymptomatic   Severe COPD   -continue outpt med regimen - continue O2 support as noted above   Chronic atrial fibrillation  -rate controlled - resumed oral anticoag   Chronic systolic heart failure (October 2015 EF 45%)  -no clinical evidence of signif volume overload at this time - follow trends in wgt and Is/Os - wgt stable at ~79kg  Chronic kidney failure (baseline Cr 1.25 - 1.93) -Currently at baseline   Complete heart block -S/P pacemaker  Polymorphic VT with history of prolonged QT  -stable   DM2 -follow w/o change today   Code Status: FULL Family Communication: no family present at time of exam today   Disposition Plan: transfer to med bed - PT/OT - possible d/c 3/21 if sats favorable and does well w/ PT/OT   Consultants: Neurology   Procedure/Significant Events: 3/14 CT head without contrast - Moderate atrophy and chronic microvascular disease. -No acute findings are evident. 3/15 EEG consistent with a potential seizure focus in the left parieto-occipital region. There was no seizure recorded on this study  Antibiotics: Vancomycin 3/14 Cefepime 3/14  Acyclovir 3/14  Ceftriaxone 3/14 > 3/18  DVT prophylaxis: eliquis  Objective: Blood pressure 132/62, pulse 70, temperature 97.2 F (36.2 C), temperature source Axillary, resp. rate 26, height  (1.6 m), weight 78.7  kg (173 lb 8 oz), SpO2 91 %.  Intake/Output Summary (Last 24 hours) at 11/10/14 1525 Last data filed at 11/10/14 1516  Gross per 24 hour  Intake    540 ml  Output    400 ml  Net    140 ml   Exam: General:  No acute respiratory distress - alert and conversant  Lungs: Clear to auscultation bilaterally without wheezes or crackles - distant BS all fields  Cardiovascular: Regular rate without murmur gallop or rub   Abdomen: Nontender, nondistended, soft, bowel sounds positive, no rebound, no ascites, no appreciable mass Extremities: No significant cyanosis, clubbing, or edema bilateral lower extremities  Data Reviewed: Basic Metabolic Panel:  Recent Labs Lab 11/04/14 1630 11/05/14 0345 11/07/14 0350 11/09/14 0323  NA 138 142 139 139  K 3.9 4.2 4.4 4.3  CL 95* 97 94* 96  CO2 33* 37* 36* 36*  GLUCOSE 200* 175* 189* 213*  BUN 19 20 31* 26*  CREATININE 1.52* 1.67* 1.47* 1.47*  CALCIUM 9.3 8.9 9.0 9.0   Liver Function Tests:  Recent Labs Lab 11/04/14 1630 11/07/14 0350  AST 23 25  ALT 9 10  ALKPHOS 69 60  BILITOT 1.2 0.6  PROT 6.6 6.0  ALBUMIN 3.7 2.9*    Recent Labs Lab 11/05/14 0725  AMMONIA 19   CBC:  Recent Labs Lab 11/04/14 1630  11/05/14 0910 11/06/14 0124 11/07/14 0350 11/08/14 0305 11/09/14 0323  WBC 12.4*  < > 7.7 7.2 9.3 8.9 8.1  NEUTROABS 10.9*  --   --   --   --   --   --   HGB 12.0  < > 10.4* 10.4* 10.1* 10.0* 10.0*  HCT 41.0  < > 36.0 35.5* 34.9* 33.7* 34.2*  MCV 92.8  < > 94.7 92.9 93.1 91.1 92.2  PLT 153  < > 104* 119* 151 158 161  < > = values in this interval not displayed. Cardiac Enzymes:  Recent Labs Lab 11/05/14 0050 11/05/14 0910 11/05/14 1445 11/05/14 2006 11/06/14 0015  CKTOTAL  --  104 97  --   --   CKMB  --   --  1.3  --   --   TROPONINI 0.03 <0.03 <0.03 <0.03 <0.03    CBG:  Recent Labs Lab 11/09/14 0013 11/09/14 0739 11/09/14 1154 11/09/14 1550 11/10/14 1238  GLUCAP 271* 142* 183* 217* 170*    Recent Results (from the past 240 hour(s))  Blood culture (routine x 2)     Status: None (Preliminary result)   Collection Time: 11/04/14  4:30 PM  Result Value Ref Range Status   Specimen Description BLOOD LEFT ARM  Final   Special Requests BOTTLES DRAWN AEROBIC AND ANAEROBIC 5CC  Final   Culture   Final           BLOOD CULTURE RECEIVED NO GROWTH TO DATE CULTURE WILL BE HELD FOR 5 DAYS BEFORE ISSUING A FINAL NEGATIVE  REPORT Performed at [facility]    Report Status PENDING  Incomplete  Blood culture (routine x 2)     Status: None (Preliminary result)   Collection Time: 11/04/14  4:35 PM  Result Value Ref Range Status   Specimen Description BLOOD LEFT FOREARM  Final   Special Requests BOTTLES DRAWN AEROBIC AND ANAEROBIC 5CC  Final   Culture   Final           BLOOD CULTURE RECEIVED NO GROWTH TO DATE CULTURE WILL BE HELD FOR 5 DAYS BEFORE ISSUING A FINAL  NEGATIVE REPORT Performed at [facility]    Report Status PENDING  Incomplete  Urine culture     Status: None   Collection Time: 11/04/14  7:54 PM  Result Value Ref Range Status   Specimen Description URINE, RANDOM  Final   Special Requests NONE  Final   Colony Count NO GROWTH Performed at [facility]   Final   Culture NO GROWTH Performed at [facility]   Final   Report Status 11/06/2014 FINAL  Final  MRSA PCR Screening     Status: None   Collection Time: 11/04/14 11:45 PM  Result Value Ref Range Status   MRSA by PCR NEGATIVE NEGATIVE Final    Comment:        The GeneXpert MRSA Assay (FDA approved for NASAL specimens only), is one component of a comprehensive MRSA colonization surveillance program. It is not intended to diagnose MRSA infection nor to guide or monitor treatment for MRSA infections.      Studies:  Recent x-ray studies have been reviewed in detail by the Attending Physician  Scheduled Meds:  Scheduled Meds: . apixaban  2.5 mg Oral BID  . atorvastatin  40 mg Oral q1800  . budesonide (PULMICORT) nebulizer solution  0.25 mg Nebulization BID  . chlorhexidine  15 mL Mouth Rinse BID  . furosemide  20 mg Oral Daily  . insulin aspart  0-15 Units Subcutaneous TID WC  . insulin glargine  10 Units Subcutaneous QHS  . ipratropium  0.5 mg Nebulization TID  . levalbuterol  0.63 mg Nebulization TID  . levETIRAcetam  500 mg Oral BID  . linagliptin  5 mg Oral Daily  . montelukast  10 mg  Oral QHS  . pantoprazole  40 mg Oral Daily  . PARoxetine  10 mg Oral Daily  . predniSONE  10 mg Oral Q breakfast  . sodium chloride  3 mL Intravenous Q12H    Time spent on care of this patient: 25 mins  Lonia Blood, MD [facility] For Consults/Admissions - Flow Manager - [phone] Office  [phone]  Contact MD directly via text page:      [email or web address]      password [facility]  11/10/2014, 3:25 PM   LOS: 6 days

## 2014-11-10 NOTE — Progress Notes (Signed)
Return call to [phone] for report, no answer.

## 2014-11-10 NOTE — Progress Notes (Signed)
Pt transferred to room 5W05 from 2S. Pt is alert and oriented x4, able to follow commands, denies any pain at this time. VSS. Standard unit orientation completed, bed in lowest position, call bell is within reach. Will continue to monitor pt closely. Report received from [place] RN.

## 2014-11-10 NOTE — Progress Notes (Signed)
Called 5W @ 16)) to give report. RN will call me back when patient is assigned

## 2014-11-11 ENCOUNTER — Inpatient Hospital Stay (HOSPITAL_COMMUNITY): Payer: Medicare Other

## 2014-11-11 LAB — CULTURE, BLOOD (ROUTINE X 2)
Culture: NO GROWTH
Culture: NO GROWTH

## 2014-11-11 LAB — GLUCOSE, CAPILLARY
GLUCOSE-CAPILLARY: 187 mg/dL — AB (ref 70–99)
Glucose-Capillary: 122 mg/dL — ABNORMAL HIGH (ref 70–99)
Glucose-Capillary: 248 mg/dL — ABNORMAL HIGH (ref 70–99)
Glucose-Capillary: 104 mg/dL — ABNORMAL HIGH (ref 70–99)

## 2014-11-11 LAB — HEMOGLOBIN A1C
Hgb A1c MFr Bld: 7.1 % — ABNORMAL HIGH (ref 4.8–5.6)
Mean Plasma Glucose: 157 mg/dL

## 2014-11-11 MED ORDER — FUROSEMIDE 10 MG/ML IJ SOLN
40.0000 mg | Freq: Once | INTRAMUSCULAR | Status: AC
  Administered 2014-11-11: 40 mg via INTRAVENOUS
  Filled 2014-11-11: qty 4

## 2014-11-11 MED ORDER — FUROSEMIDE 40 MG PO TABS
40.0000 mg | ORAL_TABLET | Freq: Every day | ORAL | Status: DC
  Administered 2014-11-12: 40 mg via ORAL
  Filled 2014-11-11: qty 1

## 2014-11-11 NOTE — Progress Notes (Signed)
CARE MANAGEMENT NOTE 11/11/2014  Patient:  [name],[name]   Account Number:  [id number]  Date Initiated:  11/08/2014  Documentation initiated by:  MAYO,HENRIETTA  Subjective/Objective Assessment:   dx encephalopathy; has home O2 through [employer];  lives with family    PCP  Haydee MonicaWilliam McKeowen     Action/Plan:   Anticipated DC Date:  11/12/2014   Anticipated DC Plan:  HOME W HOME HEALTH SERVICES      DC Planning Services  CM consult      [facility]   Choice offered to / List presented to:  C-1 Patient   DME arranged  OXYGEN      [facility]     HH arranged  HH-2 PT  HH-3 OT  HH-1 RN      [facility]   Status of service:  In process, will continue to follow Medicare Important Message given?  YES (If response is "NO", the following Medicare IM given date fields will be blank) Date Medicare IM given:  11/08/2014 Medicare IM given by:  MAYO,HENRIETTA Date Additional Medicare IM given:  11/11/2014 Additional Medicare IM given by:  Letha CapeEBORAH TAYLOR  [facility]:  [facility]  Per UR Regulation:  Reviewed for med. necessity/level of care/duration of stay  If discussed at Long Length of Stay Meetings, dates discussed:    Comments:  11/11/2014 1430 NCM faxed orders, facesheet and sat [facility] high flow [facility] concentrator at home. [facility] pt [facility]. States she had them in the past. [facility]. Pt lives at home with son. Has RW, tub bench, 3n1 and cane at home. Isidoro DonningAlesia Gurnie Duris RN CCM Case Mgmt phone [phone]

## 2014-11-11 NOTE — Progress Notes (Signed)
   11/11/14 1431  Oxygen Therapy/Pulse Ox  O2 Device Venturi Mask  O2 Flow Rate (L/min) 14 L/min  FiO2 (%) 55 %  SpO2 91 %  Placed patient on venturi mask 55% fi02 due to acute desaturation to 84.  Patient showed no signs of distress.  HR is consistent with past vital sign checks as is RR.  RN was notified and has paged the MD about this.

## 2014-11-11 NOTE — Progress Notes (Signed)
SATURATION QUALIFICATIONS: (This note is used to comply with regulatory documentation for home oxygen)  Patient Saturations on Room Air at Rest = 81-85%  Patient Saturations on 5LO2 at rest = 88-90%; Desat on 5LO2 to 84% when pt talking.  Patient Saturations on Room Air while Ambulating = 78-83%  Patient Saturations on 5 Liters of oxygen while Ambulating = 88-92%  Please briefly explain why patient needs home oxygen:  Pt desats on RA below 88%.  With O2 in place in general pt sats 88% and above.  Will need home O2.  Thanks.  [facility] Siobahn Worsley,PT Acute Rehabilitation [phone] (pager)

## 2014-11-11 NOTE — Evaluation (Signed)
Physical Therapy Evaluation Patient Details Name: [name] MRN: [id number] DOB: 01-06-1932 Today's Date: 11/11/2014   History of Present Illness    81 y.o. F HX severe COPD on 5 L home oxygen, chronic atrial fibrillation, complete heart block status post pacemaker, chronic systolic heart failure with EF 45% in October 2015, diabetes mellitus type 2, hyperlipidemia, and DVT who was brought to the ER after patient was found unconscious in her house by patient's family. Within a few minutes patient regained consciousness but was confused. Patient was brought to the ER. Patient was found to be febrile. Patient was able to give answers but was lethargic. ABG showed mild respiratory acidosis and patient was placed on BiPAP.  Clinical Impression  Pt admitted with above diagnosis. Pt currently with functional limitations due to the deficits listed below (see PT Problem List).  Pt desats (see below) when O2 removed and desat even with O2 in place at 5L.  Physically does well with RW therefore pt should be able to manage at home with RW with intermittent assist once sats are at a level that pt can go home.  Pt will benefit from HHPT f/u as well as HHRN to monitor O2.  Will follow acutely.   Pt will benefit from skilled PT to increase their independence and safety with mobility to allow discharge to the venue listed below.      Follow Up Recommendations Home health PT;Supervision - Intermittent    Equipment Recommendations   (home O2)    Recommendations for Other Services       Precautions / Restrictions Precautions Precautions: Fall Restrictions Weight Bearing Restrictions: No      Mobility  Bed Mobility Overal bed mobility: Independent                Transfers Overall transfer level: Needs assistance   Transfers: Sit to/from Stand;Stand Pivot Transfers Sit to Stand: Min guard Stand pivot transfers: Min assist       General transfer comment: Needed steadying assist  without device for sit to stand and stand pivot transfer.  Pt performed stand pivot transfer with bil HHA to sit on 3N1.  Pt urinated and needed total assist to clean bottom.    Ambulation/Gait Ambulation/Gait assistance: Min guard Ambulation Distance (Feet): 25 Feet Assistive device: Rolling walker (2 wheeled) Gait Pattern/deviations: Step-through pattern;Decreased stride length   Gait velocity interpretation: Below normal speed for age/gender General Gait Details: Pt able to ambulate around bed with RW with good balance overall.  Good safety awareness with RW.    Stairs            Wheelchair Mobility    Modified Rankin (Stroke Patients Only)       Balance Overall balance assessment: Needs assistance;History of Falls Sitting-balance support: No upper extremity supported;Feet supported Sitting balance-Leahy Scale: Fair     Standing balance support: Bilateral upper extremity supported;During functional activity Standing balance-Leahy Scale: Poor Standing balance comment: Pt requires UE support to balance in standing.  Does not lose balance with RW and with good safety with RW.                               Pertinent Vitals/Pain Pain Assessment: No/denies pain  SATURATION QUALIFICATIONS: (This note is used to comply with regulatory documentation for home oxygen)  Patient Saturations on Room Air at Rest = 81-85%  Patient Saturations on 5LO2 at rest = 88-90%; Desat on 5LO2 to  84% when pt talking.  Patient Saturations on Room Air while Ambulating = 78-83%  Patient Saturations on 5 Liters of oxygen while Ambulating = 88-92%  Please briefly explain why patient needs home oxygen:  Pt desats on RA below 88%.  With O2 in place in general pt sats 88% and above.  Will need home O2.   Home Living Family/patient expects to be discharged to:: Private residence Living Arrangements: Children;Other (Comment) Available Help at Discharge: Family;Available  PRN/intermittently (lives with son who works) Type of Home: House Home Access: Stairs to enter Entrance Stairs-Rails: Right Entrance Stairs-Number of Steps: 2 in the front. Level in the back Home Layout: One level Home Equipment: Bedside commode;Cane - single point;Walker - 4 wheels;Other (comment);Hand held shower head;Grab bars - tub/shower;Shower seat (home O2 at 5L)      Prior Function Level of Independence: Needs assistance   Gait / Transfers Assistance Needed: was using RW prior to admission  ADL's / Homemaking Assistance Needed: went to grocery store with daughter and used motorized w/c  Comments: granddaughter or daughter helps with heavy housework     Hand Dominance   Dominant Hand: Right    Extremity/Trunk Assessment   Upper Extremity Assessment: Defer to OT evaluation           Lower Extremity Assessment: Generalized weakness      Cervical / Trunk Assessment: Normal  Communication   Communication: HOH  Cognition Arousal/Alertness: Awake/alert Behavior During Therapy: WFL for tasks assessed/performed Overall Cognitive Status: Within Functional Limits for tasks assessed                      General Comments      Exercises General Exercises - Lower Extremity Ankle Circles/Pumps: AROM;Both;10 reps;Seated Long Arc Quad: AROM;Both;10 reps;Seated      Assessment/Plan    PT Assessment Patient needs continued PT services  PT Diagnosis Generalized weakness   PT Problem List Decreased balance;Decreased activity tolerance;Decreased mobility;Decreased knowledge of use of DME;Decreased safety awareness;Decreased knowledge of precautions  PT Treatment Interventions DME instruction;Gait training;Functional mobility training;Therapeutic activities;Therapeutic exercise;Balance training;Patient/family education;Stair training   PT Goals (Current goals can be found in the Care Plan section) Acute Rehab PT Goals Patient Stated Goal: to go home PT Goal  Formulation: With patient Time For Goal Achievement: 11/18/14 Potential to Achieve Goals: Good    Frequency Min 3X/week   Barriers to discharge        Co-evaluation               End of Session Equipment Utilized During Treatment: Gait belt;Oxygen Activity Tolerance: Patient limited by fatigue Patient left: in chair;with call bell/phone within reach;with chair alarm set Nurse Communication: Mobility status         Time: [id number] PT Time Calculation (min) (ACUTE ONLY): 16 min   Charges:   PT Evaluation $Initial PT Evaluation Tier I: 1 Procedure     PT G CodesBerline Lopes:        Lonnette Shrode F 11/11/2014, 11:34 AM  Eber Jonesawn Dennette Faulconer,PT Acute Rehabilitation [phone] (pager)

## 2014-11-11 NOTE — Clinical Documentation Improvement (Signed)
"  Chronic kidney failure (baseline Cr 1.25-1.93)" currently documented in chart. Current labs as below.  Please specify the stage of CKD and document in your progress note / carry over to the discharge summary.    Component      Creatinine  Latest Ref Rng      0.50 - 1.10 mg/dL  1/61/09603/14/2016     4:544:30 PM 1.52 (H)  11/05/2014     3:45 AM 1.67 (H)  11/07/2014     3:50 AM 1.47 (H)  11/09/2014     3:23 AM 1.47 (H)   Component      GFR calc non Af Amer  Latest Ref Rng      >90 mL/min  11/04/2014     4:30 PM 31 (L)  11/05/2014     3:45 AM 28 (L)  11/07/2014     3:50 AM 32 (L)  11/09/2014     3:23 AM 32 (L)   . Document the stage of CKD  --Chronic kidney disease, stage 3 (moderate) - GFR 30-59 --Chronic kidney disease, stage 4 (severe) - GFR 15-29 --Chronic kidney disease, stage 5- GFR < 15   Thank you, Doy MinceVangela Deren Degrazia, RN [phone] Clinical Documentation Specialist

## 2014-11-11 NOTE — Progress Notes (Signed)
[name] TEAM 1 - Stepdown/ICU TEAM Progress Note  [name] ZOX:[id number] DOB: January 22, 1932 DOA: 11/04/2014 PCP: [name] Corwin, MD  Admit HPI / Brief Narrative: 81 y.o. F HX  severe COPD on 5 L home oxygen, chronic atrial fibrillation, complete heart block status post pacemaker, chronic systolic heart failure with EF 45% in October 2015, diabetes mellitus type 2, hyperlipidemia, and DVT who was brought to the ER after patient was found unconscious in her house by patient's family. Within a few minutes patient regained consciousness but was confused. Patient was brought to the ER. Patient was found to be febrile. Patient was able to give answers but was lethargic. ABG showed mild respiratory acidosis and patient was placed on BiPAP. CT of the head did not show anything acute. Patient had blood cultures drawn and was started on empiric antibiotics. On exam patient is following commands and answers questions but gets very sleepy. Patient stated she had a new oxygen tank cord which is lengthy and was not sure if she had tripped and fell on it.  HPI/Subjective: Pt was doing very well this morning, with plans being made for d/c home today, pending PT/OT evals.  She was tolerating 5L [place] O2 support w/ sats consistently at 88% or >.  She had no new complaints.  Unfortunately, before she was d/c, she experienced and unexpected sudden desaturation, requiring return to a Venturi mask.  She was asymptomatic.    Assessment/Plan:  Acute encephalopathy - resolved -UDS negative - TSH normal - Head CT negative for acute findings; cannot obtain MRI secondary to pacer - urine culture no growth - suspect this was all post-ictal state - is currently at her baseline mental status   Acute on chronic hypoxic and hypercapnic resp failure Was slow to tolerate wean to [place] oxygen - is chronically on O2 at home, between 2 and 5L per her hx - no evidence of respiratory distress, but suffered acute desaturation  today - f/u CXR obtained and noted no new findings - no sx to suggest PNA apart from hypoxia - is net negative ~4L, with stable wgt - resumed venturi mask support short term - attempt to push diuresis further (though not entirely convinced edema is to blame), and wean back to  asap - push IS   Seizures - new onset -EEG positive for focal seizure - Neurology recommended Keppra 500 BID - tolerating Keppra w/ no evidence of ongoing seizure activity   UTI -Patient with cloudy urine and many bacteria - culture was not helpful - has completed 5 days of abx tx - currently asymptomatic   Severe COPD   -continue outpt med regimen - continue O2 support as noted above   Chronic atrial fibrillation  -rate controlled - resumed oral anticoag   Chronic systolic heart failure (October 2015 EF 45%)  -no clinical evidence of signif volume overload at this time - follow trends in wgt and Is/Os - wgt stable at ~79kg - is net negative ~4L - as noted above will diurese a bit extra today and follow sats   Chronic kidney failure Stage 3 (baseline Cr 1.25 - 1.93) -Currently at baseline   Complete heart block -S/P pacemaker  Polymorphic VT with history of prolonged QT  -stable   DM2 -follow w/o change today   Code Status: FULL Family Communication: no family present at time of exam today   Disposition Plan: possible d/c 3/2s if sats favorable  Consultants: Neurology   Procedure/Significant Events: 3/14 CT head without  contrast - Moderate atrophy and chronic microvascular disease. -No acute findings are evident. 3/15 EEG consistent with a potential seizure focus in the left parieto-occipital region. There was no seizure recorded on this study  Antibiotics: Vancomycin 3/14 Cefepime 3/14  Acyclovir 3/14  Ceftriaxone 3/14 > 3/18  DVT prophylaxis: eliquis  Objective: Blood pressure 109/52, pulse 69, temperature 98.7 F (37.1 C), temperature source Oral, resp. rate 16, height  (1.6 m),  weight 78.6 kg (173 lb 4.5 oz), SpO2 94 %.  Intake/Output Summary (Last 24 hours) at 11/11/14 1703 Last data filed at 11/11/14 1326  Gross per 24 hour  Intake    240 ml  Output    575 ml  Net   -335 ml   Exam: General:  No acute respiratory distress - alert and conversant  Lungs: Clear to auscultation bilaterally without wheezes or crackles - distant BS th/o   Cardiovascular: Regular rate without murmur gallop or rub  Abdomen: Nontender, nondistended, soft, bowel sounds positive, no rebound, no ascites, no appreciable mass Extremities: No significant cyanosis, clubbing, edema bilateral lower extremities  Data Reviewed: Basic Metabolic Panel:  Recent Labs Lab 11/05/14 0345 11/07/14 0350 11/09/14 0323  NA 142 139 139  K 4.2 4.4 4.3  CL 97 94* 96  CO2 37* 36* 36*  GLUCOSE 175* 189* 213*  BUN 20 31* 26*  CREATININE 1.67* 1.47* 1.47*  CALCIUM 8.9 9.0 9.0   Liver Function Tests:  Recent Labs Lab 11/07/14 0350  AST 25  ALT 10  ALKPHOS 60  BILITOT 0.6  PROT 6.0  ALBUMIN 2.9*    Recent Labs Lab 11/05/14 0725  AMMONIA 19   CBC:  Recent Labs Lab 11/05/14 0910 11/06/14 0124 11/07/14 0350 11/08/14 0305 11/09/14 0323  WBC 7.7 7.2 9.3 8.9 8.1  HGB 10.4* 10.4* 10.1* 10.0* 10.0*  HCT 36.0 35.5* 34.9* 33.7* 34.2*  MCV 94.7 92.9 93.1 91.1 92.2  PLT 104* 119* 151 158 161   Cardiac Enzymes:  Recent Labs Lab 11/05/14 0050 11/05/14 0910 11/05/14 1445 11/05/14 2006 11/06/14 0015  CKTOTAL  --  104 97  --   --   CKMB  --   --  1.3  --   --   TROPONINI 0.03 <0.03 <0.03 <0.03 <0.03    CBG:  Recent Labs Lab 11/09/14 1550 11/10/14 1238 11/10/14 2229 11/11/14 0801 11/11/14 1150  GLUCAP 217* 170* 226* 122* 187*    Recent Results (from the past 240 hour(s))  Blood culture (routine x 2)     Status: None   Collection Time: 11/04/14  4:30 PM  Result Value Ref Range Status   Specimen Description BLOOD LEFT ARM  Final   Special Requests BOTTLES DRAWN AEROBIC  AND ANAEROBIC 5CC  Final   Culture   Final    NO GROWTH 5 DAYS Performed at [facility]    Report Status 11/11/2014 FINAL  Final  Blood culture (routine x 2)     Status: None   Collection Time: 11/04/14  4:35 PM  Result Value Ref Range Status   Specimen Description BLOOD LEFT FOREARM  Final   Special Requests BOTTLES DRAWN AEROBIC AND ANAEROBIC 5CC  Final   Culture   Final    NO GROWTH 5 DAYS Performed at [facility]    Report Status 11/11/2014 FINAL  Final  Urine culture     Status: None   Collection Time: 11/04/14  7:54 PM  Result Value Ref Range Status   Specimen Description URINE,  RANDOM  Final   Special Requests NONE  Final   Colony Count NO GROWTH [facility]   Final   Culture NO GROWTH [facility] at [facility]   Final   Report Status 11/06/2014 FINAL  Final  MRSA PCR Screening     Status: None   Collection Time: 11/04/14 11:45 PM  Result Value Ref Range Status   MRSA by PCR NEGATIVE NEGATIVE Final    Comment:        The GeneXpert MRSA Assay (FDA approved for NASAL specimens only), is one component of a comprehensive MRSA colonization surveillance program. It is not intended to diagnose MRSA infection nor to guide or monitor treatment for MRSA infections.      Studies:  Recent x-ray studies have been reviewed in detail by the Attending Physician  Scheduled Meds:  Scheduled Meds: . apixaban  2.5 mg Oral BID  . atorvastatin  40 mg Oral q1800  . budesonide (PULMICORT) nebulizer solution  0.25 mg Nebulization BID  . chlorhexidine  15 mL Mouth Rinse BID  . furosemide  20 mg Oral Daily  . insulin aspart  0-15 Units Subcutaneous TID WC  . insulin glargine  14 Units Subcutaneous QHS  . levalbuterol  0.63 mg Nebulization TID  . levETIRAcetam  500 mg Oral BID  . linagliptin  5 mg Oral Daily  . montelukast  10 mg Oral QHS  . pantoprazole  40 mg Oral Daily  . PARoxetine  10 mg Oral Daily  . predniSONE  10 mg  Oral Q breakfast  . sodium chloride  3 mL Intravenous Q12H  . tiotropium  18 mcg Inhalation Daily    Time spent on care of this patient: 35 mins  Lonia BloodJeffrey T. Lai Hendriks, MD [facility] For Consults/Admissions - Flow Manager - [phone] Office  [phone]  Contact MD directly via text page:      [email or web address]      password [email or web address]  11/11/2014, 5:03 PM   LOS: 7 days

## 2014-11-11 NOTE — Evaluation (Signed)
Occupational Therapy Evaluation Patient Details Name: [name] MRN: [id number] DOB: 09/26/1931 Today's Date: 11/11/2014    History of Present Illness   81 y.o. F HX severe COPD on 5 L home oxygen, chronic atrial fibrillation, complete heart block status post pacemaker, chronic systolic heart failure with EF 45% in October 2015, diabetes mellitus type 2, hyperlipidemia, and DVT who was brought to the ER after patient was found unconscious in her house by patient's family. Within a few minutes patient regained consciousness but was confused. Patient was brought to the ER. Patient was found to be febrile. Patient was able to give answers but was lethargic. ABG showed mild respiratory acidosis and patient was placed on BiPAP.   Clinical Impression   Pt was performing self care and light housekeeping and meal prep at a modified independent level prior to admission.  Pt presents with impaired balance and decreased activity tolerance interfering with ability to perform ADL, particularly in standing.  Recommending HHOT     Follow Up Recommendations  Home health OT    Equipment Recommendations  None recommended by OT    Recommendations for Other Services       Precautions / Restrictions Precautions Precautions: Fall Restrictions Weight Bearing Restrictions: No      Mobility Bed Mobility Overal bed mobility: Independent                Transfers Overall transfer level: Needs assistance   Transfers: Sit to/from Stand;Stand Pivot Transfers Sit to Stand: Min guard Stand pivot transfers: Min assist       General transfer comment: with RW    Balance Overall balance assessment: Needs assistance;History of Falls Sitting-balance support: No upper extremity supported;Feet supported Sitting balance-Leahy Scale: Fair     Standing balance support: Bilateral upper extremity supported;During functional activity Standing balance-Leahy Scale: Poor Standing balance comment:  Pt requires UE support to balance in standing.  Does not lose balance with RW and with good safety with RW.                              ADL Overall ADL's : Needs assistance/impaired Eating/Feeding: Independent;Sitting   Grooming: Wash/dry hands;Standing;Min guard   Upper Body Bathing: Set up;Sitting   Lower Body Bathing: Min guard;Sit to/from stand   Upper Body Dressing : Set up;Sitting   Lower Body Dressing: Min guard;Sit to/from stand   Toilet Transfer: Min guard;Ambulation;BSC   Toileting- Clothing Manipulation and Hygiene: Maximal assistance;Sit to/from stand               Vision     Perception     Praxis      Pertinent Vitals/Pain Pain Assessment: No/denies pain     Hand Dominance Right   Extremity/Trunk Assessment Upper Extremity Assessment Upper Extremity Assessment: Overall WFL for tasks assessed   Lower Extremity Assessment Lower Extremity Assessment: Generalized weakness   Cervical / Trunk Assessment Cervical / Trunk Assessment: Normal   Communication Communication Communication: HOH   Cognition Arousal/Alertness: Awake/alert Behavior During Therapy: WFL for tasks assessed/performed Overall Cognitive Status: Within Functional Limits for tasks assessed                     General Comments       Exercises Exercises: General Lower Extremity     Shoulder Instructions      Home Living Family/patient expects to be discharged to:: Private residence Living Arrangements: Children (son works days) Available Help  at Discharge: Family;Available PRN/intermittently Type of Home: House Home Access: Stairs to enter [employer] CorporationEntrance Stairs-Number of Steps: 2 in the front. Level in the back Entrance Stairs-Rails: Right Home Layout: One level     Bathroom Shower/Tub: Chief Strategy OfficerTub/shower unit   Bathroom Toilet: Standard     Home Equipment: Bedside commode;Cane - single point;Walker - 4 wheels;Other (comment);Hand held shower head;Grab bars -  tub/shower;Shower seat (02)          Prior Functioning/Environment Level of Independence: Needs assistance  Gait / Transfers Assistance Needed: was using RW prior to admission ADL's / Homemaking Assistance Needed: went to grocery store with daughter and used motorized w/c   Comments: granddaughter or daughter helps with heavy housework    OT Diagnosis: Generalized weakness   OT Problem List: Decreased strength;Impaired balance (sitting and/or standing);Decreased activity tolerance;Decreased knowledge of use of DME or AE;Cardiopulmonary status limiting activity   OT Treatment/Interventions: Self-care/ADL training;Energy conservation;DME and/or AE instruction;Therapeutic activities;Patient/family education;Balance training    OT Goals(Current goals can be found in the care plan section) Acute Rehab OT Goals Patient Stated Goal: to go home OT Goal Formulation: With patient Time For Goal Achievement: 11/18/14 Potential to Achieve Goals: Good  OT Frequency: Min 2X/week   Barriers to D/C:            Co-evaluation              End of Session Equipment Utilized During Treatment: Oxygen;Rolling walker;Gait belt  Activity Tolerance: Patient limited by fatigue Patient left: in chair;with call bell/phone within reach;Other (comment) (RT in room)   Time: [id number] OT Time Calculation (min): 27 min Charges:  OT General Charges $OT Visit: 1 Procedure OT Evaluation $Initial OT Evaluation Tier I: 1 Procedure OT Treatments $Self Care/Home Management : 8-22 mins G-Codes:    Evern BioMayberry, Glenyce Randle Lynn 11/11/2014, 1:57 PM

## 2014-11-11 NOTE — Progress Notes (Signed)
Medicare Important Message given?  YES (If response is "NO", the following Medicare IM given date fields will be blank) Date Medicare IM given:  11/11/14 Medicare IM given by:  Joellen Tullos 

## 2014-11-12 DIAGNOSIS — E785 Hyperlipidemia, unspecified: Secondary | ICD-10-CM

## 2014-11-12 DIAGNOSIS — E1165 Type 2 diabetes mellitus with hyperglycemia: Secondary | ICD-10-CM

## 2014-11-12 LAB — BASIC METABOLIC PANEL
Anion gap: 7 (ref 5–15)
BUN: 22 mg/dL (ref 6–23)
CHLORIDE: 100 mmol/L (ref 96–112)
CO2: 37 mmol/L — AB (ref 19–32)
CREATININE: 1.51 mg/dL — AB (ref 0.50–1.10)
Calcium: 9 mg/dL (ref 8.4–10.5)
GFR calc non Af Amer: 31 mL/min — ABNORMAL LOW (ref >90)
GFR, EST AFRICAN AMERICAN: 36 mL/min — AB (ref >90)
Glucose, Bld: 104 mg/dL — ABNORMAL HIGH (ref 70–99)
Potassium: 4.4 mmol/L (ref 3.5–5.1)
SODIUM: 144 mmol/L (ref 135–145)

## 2014-11-12 LAB — GLUCOSE, CAPILLARY
GLUCOSE-CAPILLARY: 236 mg/dL — AB (ref 70–99)
GLUCOSE-CAPILLARY: 131 mg/dL — AB (ref 70–99)
Glucose-Capillary: 112 mg/dL — ABNORMAL HIGH (ref 70–99)

## 2014-11-12 LAB — CBC
HCT: 38.6 % (ref 36.0–46.0)
Hemoglobin: 11.3 g/dL — ABNORMAL LOW (ref 12.0–15.0)
MCH: 27 pg (ref 26.0–34.0)
MCHC: 29.3 g/dL — ABNORMAL LOW (ref 30.0–36.0)
MCV: 92.1 fL (ref 78.0–100.0)
Platelets: 196 10*3/uL (ref 150–400)
RBC: 4.19 MIL/uL (ref 3.87–5.11)
RDW: 15 % (ref 11.5–15.5)
WBC: 9.9 10*3/uL (ref 4.0–10.5)

## 2014-11-12 MED ORDER — LEVETIRACETAM 500 MG PO TABS: 500.0000 mg | ORAL_TABLET | Freq: Two times a day (BID) | ORAL | Status: DC

## 2014-11-12 MED ORDER — INSULIN PEN NEEDLE 29G X 12.7MM MISC: 14.0000 [IU] | Freq: Every day | Status: DC

## 2014-11-12 MED ORDER — APIXABAN 5 MG PO TABS: 2.5000 mg | ORAL_TABLET | Freq: Two times a day (BID) | ORAL | Status: AC

## 2014-11-12 MED ORDER — INSULIN GLARGINE 100 UNIT/ML SOLOSTAR PEN: 14.0000 [IU] | PEN_INJECTOR | Freq: Every day | SUBCUTANEOUS | Status: DC

## 2014-11-12 MED ORDER — FUROSEMIDE 20 MG PO TABS: 20.0000 mg | ORAL_TABLET | Freq: Every day | ORAL | Status: DC

## 2014-11-12 NOTE — Progress Notes (Signed)
[facility] for home oxygen regulator for possible d/c tonight. Contacted [facility],after hours line, closed until tomorrow morning. Unit CM will follow up in the am.

## 2014-11-12 NOTE — Discharge Summary (Signed)
Physician Discharge Summary  [name] Coil YQM:[id number] DOB: 07/16/1932 DOA: 11/04/2014  PCP: [name],[name], MD  Admit date: 11/04/2014 Discharge date: 11/12/2014  Time spent: 40 minutes  Recommendations for Outpatient Follow-up:  Acute encephalopathy -Most likely multifactorial in nature to include urosepsis, COPD exacerbation, acute on chronic systolic heart failure, and new onset seizures. -Patient back to baseline stable for discharge -Follow-up with PCP in one week  Seizures -EEG; positive for focal seizure see results below -Dr. Thana FarrLeslie Reynolds (neurology); discussed findings of abnormal EEG and recommendation was to start Keppra 500 BID -Continue patient on Keppra 500 mg BID -Will have patient establish care with [name]. Patient to schedule appointment in 2 weeks  UTI in an elderly patient -Patient with cloudy urine and many bacteria, completed 5 day course ceftriaxone, -Follow-up with PCP  Severe COPD (end-stage)  - continue inhalers. Continue to titrate patient O2 to maintain SPO2 89-93%;  -NCM to contact [facility] in the a.m. to arrange for high flow regulator and tubing for high flow nasal cannula heated and humidified -Continue home dose of prednisone 10 mg daily -Daughter [name] aware we will need to contact [facility] in the a.m. for this equipment  -SATURATION QUALIFICATIONS: (This note is used to comply with regulatory documentation for home oxygen) Patient Saturations on Room Air at Rest = 79% Patient Saturations on 5L while Ambulating = 77%  Patient Saturations on 5 Liters of oxygen while at rest = 86-92%  Chronic atrial fibrillation  - Currently rate controlled without medication -Continue apixaban 2.5 mg BID  Chronic systolic heart failure (LVEF October 2015 = 45% )  -Patient has relative hypotension for a person of her age will cut Lasix to 20 mg daily  -Place TED hose; when patient awake, ensure [name] removes before retiring to bed.   -Follow-up with PCP  Chronic kidney failure (baseline Cr 1.25-1.93) - Currently at baseline monitor carefully  Complete heart block -[name]/P pacemaker present  Polymorphic VT with history of prolonged QT  -Monitor telemetry closely - check EKG. Closely monitor in telemetry.  Diabetes type 2 -3/20 hemoglobin A1c= 7.1 -Lantus 14 units daily -Continue Tradjenta  5 mg  -PCP to titrate/added additional medication  HLD -lipid panel within ADA guidelines except for triglycerides. Follow-up with PCP for decision on starting niacin -Restart home dose of Zocor 80 mg daily   Discharge Diagnoses:  Principal Problem:   Acute encephalopathy Active Problems:   COPD (chronic obstructive pulmonary disease)   Chronic systolic heart failure   Chronic atrial fibrillation   Diabetes mellitus type 2, controlled   Seizures   COPD, severe   Chronic systolic congestive heart failure   Chronic renal failure, stage 3 (moderate)   Complete heart block   Urinary tract infection in elderly patient   Chronic systolic CHF (congestive heart failure)   Chronic kidney failure   Diabetes type 2, controlled   Urinary tract infectious disease   Diabetes type 2, uncontrolled   HLD (hyperlipidemia)   Discharge Condition: Stable  Diet recommendation: Heart healthy/American diabetic Association  Filed Weights   11/10/14 0500 11/11/14 0603 11/12/14 0355  Weight: 78.7 kg (173 lb 8 oz) 78.6 kg (173 lb 4.5 oz) 76.6 kg (168 lb 14 oz)    History of present illness:  [name] is a 81 y.o. WF PMHX severe COPD on 5 L home oxygen, chronic atrial fibrillation, complete heart block status post pacemaker placement, chronic systolic heart failure with last year measured was 45% in October 2015, diabetes  mellitus type 2, hyperlipidemia, DVT  Brought to the ER after patient was found unconscious in her house by [name] family. As per patient daughter who provided the history patient was found to flow at  around 2 PM when [name] family released home to check on the patient. Within a few minutes patient regained consciousness but not confused. Patient was brought to the ER. Patient was found to be febrile. Patient was able to give answers but was lethargic. ABG shows mild respiratory acidosis and patient was placed on BiPAP. CT of the head did not show anything acute. Patient had blood cultures drawn and was started on empiric antibiotics. On exam patient is following commands and answers questions but gets very sleepy. Patient states she has had a new oxygen tank cord which is lengthy and was not sure if she had tripped and fell on it. During his hospitalization patient was treated for acute encephalopathy which was multifactorial in nature to include urosepsis, COPD exacerbation, acute on chronic systolic heart failure, and new onset seizures. Patient has completed a five-day course of antibiotics for her urosepsis, and started on Keppra for her new onset seizures. In addition patient is -4.2 L. Today [name] SPO2 decreased to 78% on 5 L of O2, necessitating placing patient on Venturi mask at a setting of 15 L O2 in order to maintain her SPO2 89 and 93%. Patient and daughter feel that she is back to baseline even though patient has required Venturi mask at 15 L O2 episodically during the day to maintain her SPO2 89-93%. [name] daughter [name] feels that most likely patient was decreasing her SPO2 occasionally at home however they were not monitoring closely. Will discharge the patient with the understanding that in the a.m. we will have a NCM contact [facility] to provide a high flow regulator for patients tank and equipment for high flow nasal cannula, which should be more comfortable than the Venturi mask but provide adequate oxygenation for patient to maintain her SPO2 89 and 93%. This will allow patient to PRN use high flow nasal cannula to maintain her SPO2 89-93%.    Consultants: -Dr. Thana Farr  (neurology); phone consult    Procedure/Significant Events: 06/02/14 echocardiogram;- LVEF= 45%. Hypokinesis of the apical myocardium. - Right ventricle: Hypokinesis of the RV apex. - Hypokinesis oif the entire LV and RV apex. 3/14 CT head without contrast; -Moderate atrophy and chronic microvascular disease.  -No acute findings are evident. 3/15 EEG; consistent with a potential seizure focus in the left parieto-occipital region. There was no seizure recorded on this study   Culture 3/14 blood left arm/forearm NGTD 3/14 urine negative 3/14 MRSA by PCR negative   Antibiotics: Vancomycin 3/14>> stop 3/15  Acyclovir 3/16>> stopped 3/15  Ceftriaxone 3/15>> stopped 3/18    Discharge Exam: Filed Vitals:   11/12/14 0521 11/12/14 0733 11/12/14 1309 11/12/14 1310  BP: 136/70  146/113 124/52  Pulse: 70  72   Temp: 98 F (36.7 C)  98.5 F (36.9 C)   TempSrc: Oral     Resp: 22  18   Height:      Weight:      SpO2: 94% 91% 97%     General:  A/O 4, continued acute on chronic respiratory distress but significantly improved Lungs: Clear to auscultation bilaterally without wheezes or crackles Cardiovascular: Regular rate and rhythm without murmur gallop or rub normal S1 and S2 Abdomen: Nontender, nondistended, soft, bowel sounds positive, no rebound, no ascites, no appreciable mass Extremities: No significant  cyanosis, clubbing, or edema bilateral lower extremities  Discharge Instructions     Medication List    STOP taking these medications        metFORMIN 500 MG 24 hr tablet  Commonly known as:  GLUCOPHAGE-XR      TAKE these medications        apixaban 5 MG Tabs tablet  Commonly known as:  ELIQUIS  Take 0.5 tablets (2.5 mg total) by mouth 2 (two) times daily.     atorvastatin 40 MG tablet  Commonly known as:  LIPITOR     budesonide 0.25 MG/2ML nebulizer solution  Commonly known as:  PULMICORT  Take 2 mLs (0.25 mg total) by nebulization every 6 (six)  hours.     DEXILANT 60 MG capsule  Generic drug:  dexlansoprazole  Take 60 mg by mouth daily.     furosemide 20 MG tablet  Commonly known as:  LASIX  Take 1 tablet (20 mg total) by mouth daily.  Start taking on:  11/13/2014     Insulin Glargine 100 UNIT/ML Solostar Pen  Commonly known as:  LANTUS SOLOSTAR  Inject 14 Units into the skin daily at 10 pm.     Insulin Pen Needle 29G X 12.7MM Misc  14 Units by Does not apply route at bedtime.     levalbuterol 1.25 MG/3ML nebulizer solution  Commonly known as:  XOPENEX  1 neb treatment 4 x day     levalbuterol 45 MCG/ACT inhaler  Commonly known as:  XOPENEX HFA  Take 1 - 2 puffs -  5 minutes apart  - 4 x day or every 4 hours as needed to rescue Asthma     levETIRAcetam 500 MG tablet  Commonly known as:  KEPPRA  Take 1 tablet (500 mg total) by mouth 2 (two) times daily.     linagliptin 5 MG Tabs tablet  Commonly known as:  TRADJENTA  Take 1 tablet (5 mg total) by mouth daily.     montelukast 10 MG tablet  Commonly known as:  SINGULAIR  Take 10 mg by mouth at bedtime.     ONE TOUCH ULTRA TEST test strip  Generic drug:  glucose blood  USE TO CHECK BLOOD GLUCOSE ONCE A DAY AS INSTRUCTED     PARoxetine 20 MG tablet  Commonly known as:  PAXIL  Take 10 mg by mouth daily.     potassium chloride SA 20 MEQ tablet  Commonly known as:  K-DUR,KLOR-CON     predniSONE 10 MG tablet  Commonly known as:  DELTASONE  Take 1 tablet (10 mg total) by mouth daily with breakfast.     simvastatin 80 MG tablet  Commonly known as:  ZOCOR  Take 80 mg by mouth as directed. Take 1/2 tablet M,W,F     tiotropium 18 MCG inhalation capsule  Commonly known as:  SPIRIVA  Place 18 mcg into inhaler and inhale daily.     vitamin B-12 1000 MCG tablet  Commonly known as:  CYANOCOBALAMIN  Take 1,000 mcg by mouth daily.     Vitamin D 2000 UNITS tablet  Take 5,000 Units by mouth daily.       Allergies  Allergen Reactions  . Advair Diskus  [Fluticasone-Salmeterol]     Itching  . Biaxin [Clarithromycin]     N/V  . Ciprofloxacin     N/V  . Doxycycline Other (See Comments)    Inside of mouth and tongue red and peeled  . Keflex [Cephalexin]  N/V  . Ketek [Telithromycin]     N/V  . Levaquin [Levofloxacin In D5w] Other (See Comments)    Arrythmia, polymorphic Vtach on 10/15 hospitalization  . Macrobid [Nitrofurantoin]     N/V  . Penicillins     Rash  . Sulfonamide Derivatives    Follow-up Information    Follow up with [facility].   Why:  Home Health RN, Physical Therapy and Occupational Therapy   Contact information:   [address] [place] [address] [phone]       Follow up with Nadean Corwin, MD. Schedule an appointment as soon as possible for a visit in 1 week.   Specialty:  Internal Medicine   Why:  Follow-up one week; hospitalization for acute encephalopathyMost likely multifactorial in nature to include urosepsis, COPD exacerbation, acute on chronic systolic heart failure, and new onset seizures.   Contact information:   [address] [place] [address] [phone]       Follow up with [facility]. Schedule an appointment as soon as possible for a visit in 2 weeks.   Why:  Establish care hospitalization;Most likely multifactorial in nature to include urosepsis, COPD exacerbation, acute on chronic systolic heart failure, and new onset seizures.   Contact information:   [address] [place] [address] [phone]       The results of significant diagnostics from this hospitalization (including imaging, microbiology, ancillary and laboratory) are listed below for reference.    Significant Diagnostic Studies: Ct Head Wo Contrast  11/04/2014   CLINICAL DATA:  Altered mental status.  Found on floor.  EXAM: CT HEAD WITHOUT CONTRAST  TECHNIQUE: Contiguous axial images were obtained from the base of the skull  through the vertex without intravenous contrast.  COMPARISON:  03/27/2011  FINDINGS: There is no intracranial hemorrhage, mass or evidence of acute infarction. There is no extra-axial fluid collection. There is moderate generalized atrophy. There is moderate periventricular hypodensity consistent with microvascular disease. No significant bony abnormality is evident.  IMPRESSION: Moderate atrophy and chronic microvascular disease. No acute findings are evident.   Electronically Signed   By: Ellery Plunk M.D.   On: 11/04/2014 22:02   Ct Cervical Spine Wo Contrast  11/04/2014   CLINICAL DATA:  Unwitnessed fall.  Found on floor.  EXAM: CT CERVICAL SPINE WITHOUT CONTRAST  TECHNIQUE: Multidetector CT imaging of the cervical spine was performed without intravenous contrast. Multiplanar CT image reconstructions were also generated.  COMPARISON:  None.  FINDINGS: There is motion degradation of the images. Vertebral bodies are normal in height. No acute fracture is evident. No acute soft tissue abnormality is evident.  IMPRESSION: Negative for acute cervical spine fracture. There is mild limitation of the study due to motion artifact.   Electronically Signed   By: Ellery Plunk M.D.   On: 11/04/2014 23:44   Dg Chest Port 1 View  11/11/2014   CLINICAL DATA:  Hypoxia, shortness of breath  EXAM: PORTABLE CHEST - 1 VIEW  COMPARISON:  11/04/2014  FINDINGS: Areas is scarring in the left mid and lower lung. There is no focal parenchymal opacity, pleural effusion, or pneumothorax. There is mild stable cardiomegaly. There is a 3 lead cardiac pacer.  The osseous structures are unremarkable.  IMPRESSION: No active cardiopulmonary disease.   Electronically Signed   By: Elige Ko   On: 11/11/2014 16:29   Dg Chest Portable 1 View  11/04/2014   CLINICAL DATA:  Altered mental  status  EXAM: PORTABLE CHEST - 1 VIEW  COMPARISON:  June 05, 2014  FINDINGS: There is scarring in the left mid and lower lung zones. There is  no edema or consolidation. Heart is borderline enlarged with pulmonary vascularity within normal limits. Pacemaker leads are attached to the right atrium and right ventricle. There is atherosclerotic change in aorta. No pneumothorax. No adenopathy. No bone lesions.  IMPRESSION: Areas of scarring on the left. No edema or consolidation. Cardiac prominence is stable.   Electronically Signed   By: Bretta Bang III M.D.   On: 11/04/2014 16:47    Microbiology: Recent Results (from the past 240 hour([name]))  Blood culture (routine x 2)     Status: None   Collection Time: 11/04/14  4:30 PM  Result Value Ref Range Status   Specimen Description BLOOD LEFT ARM  Final   Special Requests BOTTLES DRAWN AEROBIC AND ANAEROBIC 5CC  Final   Culture   Final    NO GROWTH 5 DAYS Performed at [facility]    Report Status 11/11/2014 FINAL  Final  Blood culture (routine x 2)     Status: None   Collection Time: 11/04/14  4:35 PM  Result Value Ref Range Status   Specimen Description BLOOD LEFT FOREARM  Final   Special Requests BOTTLES DRAWN AEROBIC AND ANAEROBIC 5CC  Final   Culture   Final    NO GROWTH 5 DAYS Performed at [facility]    Report Status 11/11/2014 FINAL  Final  Urine culture     Status: None   Collection Time: 11/04/14  7:54 PM  Result Value Ref Range Status   Specimen Description URINE, RANDOM  Final   Special Requests NONE  Final   Colony Count NO GROWTH Performed at [facility]   Final   Culture NO GROWTH Performed at [facility]   Final   Report Status 11/06/2014 FINAL  Final  MRSA PCR Screening     Status: None   Collection Time: 11/04/14 11:45 PM  Result Value Ref Range Status   MRSA by PCR NEGATIVE NEGATIVE Final    Comment:        The GeneXpert MRSA Assay (FDA approved for NASAL specimens only), is one component of a comprehensive MRSA colonization surveillance program. It is not intended to diagnose MRSA infection nor to guide  or monitor treatment for MRSA infections.      Labs: Basic Metabolic Panel:  Recent Labs Lab 11/07/14 0350 11/09/14 0323 11/12/14 0628  NA 139 139 144  K 4.4 4.3 4.4  CL 94* 96 100  CO2 36* 36* 37*  GLUCOSE 189* 213* 104*  BUN 31* 26* 22  CREATININE 1.47* 1.47* 1.51*  CALCIUM 9.0 9.0 9.0   Liver Function Tests:  Recent Labs Lab 11/07/14 0350  AST 25  ALT 10  ALKPHOS 60  BILITOT 0.6  PROT 6.0  ALBUMIN 2.9*   No results for input([name]): LIPASE, AMYLASE in the last 168 hours. No results for input([name]): AMMONIA in the last 168 hours. CBC:  Recent Labs Lab 11/06/14 0124 11/07/14 0350 11/08/14 0305 11/09/14 0323 11/12/14 0628  WBC 7.2 9.3 8.9 8.1 9.9  HGB 10.4* 10.1* 10.0* 10.0* 11.3*  HCT 35.5* 34.9* 33.7* 34.2* 38.6  MCV 92.9 93.1 91.1 92.2 92.1  PLT 119* 151 158 161 196   Cardiac Enzymes:  Recent Labs Lab 11/05/14 2006 11/06/14 0015  TROPONINI <0.03 <0.03   BNP: BNP (last 3 results) No results for  input([name]): BNP in the last 8760 hours.  ProBNP (last 3 results)  Recent Labs  12/04/13 2258 06/02/14  PROBNP 17479.0* 13998.0*    CBG:  Recent Labs Lab 11/11/14 1703 11/11/14 2128 11/12/14 0800 11/12/14 1148 11/12/14 1643  GLUCAP 248* 104* 112* 236* 131*       Signed:  Carolyne Littles, MD [facility] [phone] pager

## 2014-11-12 NOTE — Progress Notes (Signed)
Pt and family were observed leaving the unit in good spirits after day Rn discharged pt.

## 2014-11-12 NOTE — Progress Notes (Signed)
NCM asked [facility] to take a look at patient to see if patient would be a candidate.

## 2014-11-12 NOTE — Progress Notes (Addendum)
SATURATION QUALIFICATIONS: (This note is used to comply with regulatory documentation for home oxygen)  Patient Saturations on Room Air at Rest = 79%  Patient Saturations on 5L while Ambulating = 77%    Patient Saturations on 5 Liters of oxygen while at rest = 86-92%

## 2014-11-13 NOTE — Progress Notes (Addendum)
CARE MANAGEMENT NOTE 11/13/2014  Patient:  [name],[name]   Account Number:  [id number]  Date Initiated:  11/08/2014  Documentation initiated by:  MAYO,HENRIETTA  Subjective/Objective Assessment:   dx encephalopathy; has home O2 through [facility];  lives with family    PCP  Haydee MonicaWilliam McKeowen     Action/Plan:   Anticipated DC Date:  11/12/2014   Anticipated DC Plan:  HOME W HOME HEALTH SERVICES      DC Planning Services  CM consult      [facility]   Choice offered to / List presented to:  C-1 Patient   DME arranged  OXYGEN      [facility]     HH arranged  HH-2 PT  HH-3 OT  HH-1 RN      [facility]   Status of service:  Completed, signed off Medicare Important Message given?  YES (If response is "NO", the following Medicare IM given date fields will be blank) Date Medicare IM given:  11/08/2014 Medicare IM given by:  MAYO,HENRIETTA Date Additional Medicare IM given:  11/11/2014 Additional Medicare IM given by:  Letha CapeEBORAH TAYLOR  [facility]:  [facility]  Per UR Regulation:  Reviewed for med. necessity/level of care/duration of stay  If discussed at Long Length of Stay Meetings, dates discussed:    Comments:  11/13/2014 1420 NCM contacted attending to make aware that [employer] delivered high flow pressure valve to pt home this am. Isidoro DonningAlesia Merita Hawks  RN CCM Case Mgmt phone [phone]   11/13/2014 1245 NCM contacted pt and spoke to dtr, [name]. [facility] did deliver oxygen high flow pressure valve this am. Dtr states they did have correct oxygen high flow portable tank delivered to the room by [facility] for dc on 11/12/2014. Isidoro DonningAlesia Kelyse Pask RN CCM Case Mgmt phone [phone]  11/11/2014 1430 NCM faxed orders, facesheet and [facility] concentrator at home. N[facility] spoke to pt and [facility] Pt requested [facility] for [facility]. States she had them in the past. [facility]. Pt lives at home with son. Has RW, tub bench, 3n1 and cane at home. Isidoro DonningAlesia Cadel Stairs RN CCM Case Mgmt phone [phone]

## 2014-11-15 DIAGNOSIS — F329 Major depressive disorder, single episode, unspecified: Secondary | ICD-10-CM | POA: Diagnosis not present

## 2014-11-15 DIAGNOSIS — E119 Type 2 diabetes mellitus without complications: Secondary | ICD-10-CM

## 2014-11-15 DIAGNOSIS — Z86718 Personal history of other venous thrombosis and embolism: Secondary | ICD-10-CM | POA: Diagnosis not present

## 2014-11-15 DIAGNOSIS — I5022 Chronic systolic (congestive) heart failure: Secondary | ICD-10-CM

## 2014-11-15 DIAGNOSIS — E538 Deficiency of other specified B group vitamins: Secondary | ICD-10-CM | POA: Diagnosis not present

## 2014-11-15 DIAGNOSIS — F419 Anxiety disorder, unspecified: Secondary | ICD-10-CM | POA: Diagnosis not present

## 2014-11-15 DIAGNOSIS — Z9981 Dependence on supplemental oxygen: Secondary | ICD-10-CM | POA: Diagnosis not present

## 2014-11-15 DIAGNOSIS — I129 Hypertensive chronic kidney disease with stage 1 through stage 4 chronic kidney disease, or unspecified chronic kidney disease: Secondary | ICD-10-CM | POA: Diagnosis not present

## 2014-11-15 DIAGNOSIS — I4891 Unspecified atrial fibrillation: Secondary | ICD-10-CM | POA: Diagnosis not present

## 2014-11-15 DIAGNOSIS — Z794 Long term (current) use of insulin: Secondary | ICD-10-CM | POA: Diagnosis not present

## 2014-11-15 DIAGNOSIS — G40909 Epilepsy, unspecified, not intractable, without status epilepticus: Secondary | ICD-10-CM | POA: Diagnosis not present

## 2014-11-15 DIAGNOSIS — Z7901 Long term (current) use of anticoagulants: Secondary | ICD-10-CM | POA: Diagnosis not present

## 2014-11-15 DIAGNOSIS — N189 Chronic kidney disease, unspecified: Secondary | ICD-10-CM | POA: Diagnosis not present

## 2014-11-15 DIAGNOSIS — J441 Chronic obstructive pulmonary disease with (acute) exacerbation: Secondary | ICD-10-CM

## 2014-11-18 DIAGNOSIS — Z7901 Long term (current) use of anticoagulants: Secondary | ICD-10-CM | POA: Diagnosis not present

## 2014-11-18 DIAGNOSIS — I4891 Unspecified atrial fibrillation: Secondary | ICD-10-CM | POA: Diagnosis not present

## 2014-11-18 DIAGNOSIS — Z86718 Personal history of other venous thrombosis and embolism: Secondary | ICD-10-CM | POA: Diagnosis not present

## 2014-11-18 DIAGNOSIS — Z9981 Dependence on supplemental oxygen: Secondary | ICD-10-CM | POA: Diagnosis not present

## 2014-11-18 DIAGNOSIS — E119 Type 2 diabetes mellitus without complications: Secondary | ICD-10-CM | POA: Diagnosis not present

## 2014-11-18 DIAGNOSIS — I129 Hypertensive chronic kidney disease with stage 1 through stage 4 chronic kidney disease, or unspecified chronic kidney disease: Secondary | ICD-10-CM | POA: Diagnosis not present

## 2014-11-18 DIAGNOSIS — E538 Deficiency of other specified B group vitamins: Secondary | ICD-10-CM | POA: Diagnosis not present

## 2014-11-18 DIAGNOSIS — Z794 Long term (current) use of insulin: Secondary | ICD-10-CM | POA: Diagnosis not present

## 2014-11-18 DIAGNOSIS — F329 Major depressive disorder, single episode, unspecified: Secondary | ICD-10-CM | POA: Diagnosis not present

## 2014-11-18 DIAGNOSIS — J441 Chronic obstructive pulmonary disease with (acute) exacerbation: Secondary | ICD-10-CM | POA: Diagnosis not present

## 2014-11-18 DIAGNOSIS — F419 Anxiety disorder, unspecified: Secondary | ICD-10-CM | POA: Diagnosis not present

## 2014-11-18 DIAGNOSIS — N189 Chronic kidney disease, unspecified: Secondary | ICD-10-CM | POA: Diagnosis not present

## 2014-11-18 DIAGNOSIS — G40909 Epilepsy, unspecified, not intractable, without status epilepticus: Secondary | ICD-10-CM | POA: Diagnosis not present

## 2014-11-18 DIAGNOSIS — I5022 Chronic systolic (congestive) heart failure: Secondary | ICD-10-CM | POA: Diagnosis not present

## 2014-11-20 ENCOUNTER — Ambulatory Visit: Payer: Medicare Other | Admitting: Internal Medicine

## 2014-11-20 ENCOUNTER — Ambulatory Visit: Payer: Medicare Other | Admitting: Cardiovascular Disease

## 2014-11-20 NOTE — Progress Notes (Signed)
Patient ID: [name], female   DOB: 1932/01/22, 81 y.o.   MRN: [id number]  R E S C H E D U L E D

## 2014-11-22 ENCOUNTER — Ambulatory Visit (INDEPENDENT_AMBULATORY_CARE_PROVIDER_SITE_OTHER): Payer: Medicare Other | Admitting: Internal Medicine

## 2014-11-22 ENCOUNTER — Encounter: Payer: Self-pay | Admitting: Internal Medicine

## 2014-11-22 VITALS — BP 128/70 | HR 64 | Temp 97.5°F | Resp 16 | Ht 63.0 in | Wt 182.0 lb

## 2014-11-22 DIAGNOSIS — Z79899 Other long term (current) drug therapy: Secondary | ICD-10-CM | POA: Diagnosis not present

## 2014-11-22 DIAGNOSIS — I150 Renovascular hypertension: Secondary | ICD-10-CM | POA: Diagnosis not present

## 2014-11-22 DIAGNOSIS — J449 Chronic obstructive pulmonary disease, unspecified: Secondary | ICD-10-CM | POA: Diagnosis not present

## 2014-11-22 DIAGNOSIS — I482 Chronic atrial fibrillation, unspecified: Secondary | ICD-10-CM

## 2014-11-22 DIAGNOSIS — G40909 Epilepsy, unspecified, not intractable, without status epilepticus: Secondary | ICD-10-CM | POA: Diagnosis not present

## 2014-11-22 LAB — BASIC METABOLIC PANEL WITH GFR
BUN: 13 mg/dL (ref 6–23)
CHLORIDE: 104 meq/L (ref 96–112)
CO2: 33 meq/L — AB (ref 19–32)
Calcium: 9.1 mg/dL (ref 8.4–10.5)
Creat: 1.2 mg/dL — ABNORMAL HIGH (ref 0.50–1.10)
GFR, Est African American: 49 mL/min — ABNORMAL LOW
GFR, Est Non African American: 43 mL/min — ABNORMAL LOW
Glucose, Bld: 117 mg/dL — ABNORMAL HIGH (ref 70–99)
POTASSIUM: 3.9 meq/L (ref 3.5–5.3)
Sodium: 148 mEq/L — ABNORMAL HIGH (ref 135–145)

## 2014-11-22 NOTE — Patient Instructions (Signed)
Restart Metformin 500 XR daily   & cut Lantus in 1/2 to 7 units /day  And if   Sugars stay less than 180 can stop Lantus completely   And if sugars go over 180  Then   Add a 2sd Metformin

## 2014-11-25 ENCOUNTER — Encounter: Payer: Self-pay | Admitting: Internal Medicine

## 2014-11-25 LAB — LEVETIRACETAM LEVEL: Keppra (Levetiracetam): 35.3 ug/mL

## 2014-11-25 NOTE — Progress Notes (Addendum)
Patient ID: [name] MarvelGertrude S [name], female   DOB: 03-09-1932, 81 y.o.   MRN: [id number]   This very nice 81 [facility]. [facility] for hospital follow up from recent admission 3/14-3/22/2016 with Hypertension, Hyperlipidemia, Pre-Diabetes, End Stage COPD, Chronic aFib and Vitamin D Deficiency. Patient has evaluated for change in Mental Status and had no CVA, but was in fact found to have UTI & also a non focal seizure & was started on Keppra and she had no awareness of any recurrent episodes or changes in mental status. Patient continues to require nasal O2 flow rates up to 5 LPM. Her O2 sats usu run in the mid to low 80's.   Patient is treated for HTN since 1996 & and today's BP: 128/70 mmHg. Patient has had no complaints of any cardiac type chest pain, palpitations,dizziness, claudication, or dependent edema. She does have chronic dyspnea but no orthopnea/PND, or sputum production.   Hyperlipidemia has been controlled with diet & meds. Patient denies myalgias or other med SE's. Last Lipids were Total Chol 176; HDL  50; LDL 80; Trig 231 on 09/26/2014   Also, the patient has history of T2_NIDDM since 2007 and has had no symptoms of reactive hypoglycemia, diabetic polys, paresthesias or visual blurring.  She states she was started on Lantus in the hospital and would prefer to switch back to oral medications.  Last A1c was 7.1% on  11/10/2014.   Further, the patient also has history of Vitamin D Deficiency of 6 in 2008 and she supplements vitamin D without any suspected side-effects. Last vitamin D was 73 on 09/26/2014.  Medication Sig  . apixaban (ELIQUIS) 5 MG TABS tablet Take 0.5 tablets (2.5 mg total) by mouth 2 (two) times daily.  Marland Kitchen. atorvastatin (LIPITOR) 40 MG tablet   . budesonide (PULMICORT) 0.25 MG/2ML nebulizer solution Take 2 mLs (0.25 mg total) by nebulization every 6 (six) hours.  . Cholecalciferol (VITAMIN D) 2000 UNITS tablet Take 5,000 Units by mouth daily.   Marland Kitchen. dexlansoprazole (DEXILANT) 60 MG capsule  Take 60 mg by mouth daily.  . furosemide (LASIX) 20 MG tablet Take 1 tablet (20 mg total) by mouth daily.  . Insulin Glargine (LANTUS SOLOSTAR) 100 UNIT/ML Solostar Pen Inject 14 Units into the skin daily at 10 pm.  . Insulin Pen Needle 29G X 12.7MM MISC 14 Units by Does not apply route at bedtime.  . levalbuterol (XOPENEX HFA) 45 MCG/ACT inhaler Take 1 - 2 puffs -  5 minutes apart  - 4 x day or every 4 hours as needed to rescue Asthma  . levalbuterol (XOPENEX) 1.25 MG/3ML nebulizer solution 1 neb treatment 4 x day  . levETIRAcetam (KEPPRA) 500 MG tablet Take 1 tablet (500 mg total) by mouth 2 (two) times daily.  Marland Kitchen. linagliptin (TRADJENTA) 5 MG TABS tablet Take 1 tablet (5 mg total) by mouth daily.  . montelukast (SINGULAIR) 10 MG tablet Take 10 mg by mouth at bedtime.  . ONE TOUCH ULTRA TEST test strip USE TO CHECK BLOOD GLUCOSE ONCE A DAY AS INSTRUCTED  . PARoxetine (PAXIL) 20 MG tablet Take 10 mg by mouth daily.   . potassium chloride SA (K-DUR,KLOR-CON) 20 MEQ tablet   . predniSONE (DELTASONE) 10 MG tablet Take 1 tablet (10 mg total) by mouth daily with breakfast.  . simvastatin (ZOCOR) 80 MG tablet Take 80 mg by mouth as directed. Take 1/2 tablet M,W,F  . tiotropium (SPIRIVA) 18 MCG inhalation capsule Place 18 mcg into inhaler and inhale daily.  . vitamin  B-12 (CYANOCOBALAMIN) 1000 MCG tablet Take 1,000 mcg by mouth daily.   Allergies  Allergen Reactions  . Advair Diskus [Fluticasone-Salmeterol]     Itching  . Biaxin [Clarithromycin]     N/V  . Ciprofloxacin     N/V  . Doxycycline Other (See Comments)    Inside of mouth and tongue red and peeled  . Keflex [Cephalexin]     N/V  . Ketek [Telithromycin]     N/V  . Levaquin [Levofloxacin In D5w] Other (See Comments)    Arrythmia, polymorphic Vtach on 10/15 hospitalization  . Macrobid [Nitrofurantoin]     N/V  . Penicillins     Rash  . Sulfonamide Derivatives    PMHx:   Past Medical History  Diagnosis Date  . AV block,  complete   . CHF (congestive heart failure)   . AAA (abdominal aortic aneurysm)   . Dyslipidemia   . History of colonic polyps   . Anxiety and depression   . Diverticulosis of colon   . Asthma   . Acute renal failure   . Atrial flutter   . Hyperglycemia   . UTI (lower urinary tract infection)   . COPD (chronic obstructive pulmonary disease)   . HTN (hypertension)   . DM (diabetes mellitus)   . GERD (gastroesophageal reflux disease)   . DVT (deep venous thrombosis)   . B12 deficiency   . Angiodysplasia of intestine (without mention of hemorrhage)    Immunization History  Administered Date(s) Administered  . Pneumococcal Conjugate-13 06/25/2014  . Pneumococcal-Unspecified 08/23/2001, 03/07/2013  . Td 08/24/2003  . Zoster 08/26/2008   Past Surgical History  Procedure Laterality Date  . Tubal ligation    . Abdominal hysterectomy    . Appendectomy    . Pacemaker insertion      medtronic   . Colonoscopy  02/06/2002    161.09,604.54  . Bladder suspension     FHx:    Reviewed / unchanged  SHx:    Reviewed / unchanged  Systems Review:  Constitutional: Denies fever, chills, wt changes, headaches, insomnia, fatigue, night sweats, change in appetite. Eyes: Denies redness, blurred vision, diplopia, discharge, itchy, watery eyes.  ENT: Denies discharge, congestion, post nasal drip, epistaxis, sore throat, earache, hearing loss, dental pain, tinnitus, vertigo, sinus pain, snoring.  CV: Denies chest pain, palpitations, irregular heartbeat, syncope, dyspnea, diaphoresis, orthopnea, PND, claudication or edema. Respiratory: denies cough, dyspnea, DOE, pleurisy, hoarseness, laryngitis, wheezing.  Gastrointestinal: Denies dysphagia, odynophagia, heartburn, reflux, water brash, abdominal pain or cramps, nausea, vomiting, bloating, diarrhea, constipation, hematemesis, melena, hematochezia  or hemorrhoids. Genitourinary: Denies dysuria, frequency, urgency, nocturia, hesitancy, discharge,  hematuria or flank pain. Musculoskeletal: Denies arthralgias, myalgias, stiffness, jt. swelling, pain, limping or strain/sprain.  Skin: Denies pruritus, rash, hives, warts, acne, eczema or change in skin lesion(s). Neuro: No weakness, tremor, incoordination, spasms, paresthesia or pain. Psychiatric: Denies confusion, memory loss or sensory loss. Endo: Denies change in weight, skin or hair change.  Heme/Lymph: No excessive bleeding, bruising or enlarged lymph nodes.  Physical Exam  BP 128/70   Pulse 64  Temp 97.5 F   Resp 16  Ht    Wt 182 lb     BMI 32.25   Appears well nourished and in no distress. Eyes: PERRLA, EOMs, conjunctiva no swelling or erythema. Sinuses: No frontal/maxillary tenderness ENT/Mouth: EAC's clear, TM's nl w/o erythema, bulging. Nares clear w/o erythema, swelling, exudates. Oropharynx clear without erythema or exudates. Oral hygiene is good. Tongue normal, non obstructing. Hearing intact.  Neck: Supple. Thyroid nl. Car 2+/2+ without bruits, nodes or JVD. Chest: Respirations nl with BS clear & equal w/o rales, rhonchi, wheezing or stridor.  Cor: Heart sounds normal w/ regular rate and rhythm without sig. murmurs, gallops, clicks, or rubs. Peripheral pulses normal and equal  without edema.  Abdomen: Soft & bowel sounds normal. Non-tender w/o guarding, rebound, hernias, masses, or organomegaly.  Lymphatics: Unremarkable.  Musculoskeletal: Full ROM all peripheral extremities, joint stability, 5/5 strength, and normal gait.  Skin: Warm, dry without exposed rashes, lesions or ecchymosis apparent.  Neuro: Cranial nerves intact, reflexes equal bilaterally. Sensory-motor testing grossly intact. Tendon reflexes grossly intact.  Pysch: Alert & oriented x 3.  Insight and judgement nl & appropriate. No ideations.  Assessment and Plan:  1. Hypertension - Continue monitor blood pressure at home. Continue diet/meds same.  2. Hyperlipidemia - Continue diet/meds,  exercise,& lifestyle modifications. Continue monitor periodic cholesterol/liver & renal functions   3. T2_NIDDM - Continue diet, exercise, lifestyle modifications. Monitor appropriate labs. Discussed with patient to cut Lantus dose in 1/2, ie.  14 units down to 7 units and restart Metformin at 1 or up to 2 tabs if glucose goes over 180 mg% and if CBG's remain in reasonable control over the next 1-2 weeks to stop Lantus completely and continue to monitor CBG's.  4. Vitamin D Deficiency - Continue supplementation.   5. End Stage COPD -   O2 sat at rest = 91-92 % & with mild exertion as walking O2 drops to 80-82%.   Patient's guarded prognosis was discussed with her daughter - [name].  Also discussed med and SE's. Recommended labs to assess and monitor clinical status. Further disposition pending results of labs. Over 30 minutes of exam, counseling, chart review was performed

## 2014-11-26 ENCOUNTER — Telehealth: Payer: Self-pay | Admitting: Neurology

## 2014-11-26 DIAGNOSIS — G40909 Epilepsy, unspecified, not intractable, without status epilepticus: Secondary | ICD-10-CM | POA: Diagnosis not present

## 2014-11-26 DIAGNOSIS — I5022 Chronic systolic (congestive) heart failure: Secondary | ICD-10-CM | POA: Diagnosis not present

## 2014-11-26 DIAGNOSIS — E538 Deficiency of other specified B group vitamins: Secondary | ICD-10-CM | POA: Diagnosis not present

## 2014-11-26 DIAGNOSIS — F329 Major depressive disorder, single episode, unspecified: Secondary | ICD-10-CM | POA: Diagnosis not present

## 2014-11-26 DIAGNOSIS — I4891 Unspecified atrial fibrillation: Secondary | ICD-10-CM | POA: Diagnosis not present

## 2014-11-26 DIAGNOSIS — I129 Hypertensive chronic kidney disease with stage 1 through stage 4 chronic kidney disease, or unspecified chronic kidney disease: Secondary | ICD-10-CM | POA: Diagnosis not present

## 2014-11-26 DIAGNOSIS — N189 Chronic kidney disease, unspecified: Secondary | ICD-10-CM | POA: Diagnosis not present

## 2014-11-26 DIAGNOSIS — F419 Anxiety disorder, unspecified: Secondary | ICD-10-CM | POA: Diagnosis not present

## 2014-11-26 DIAGNOSIS — Z86718 Personal history of other venous thrombosis and embolism: Secondary | ICD-10-CM | POA: Diagnosis not present

## 2014-11-26 DIAGNOSIS — Z794 Long term (current) use of insulin: Secondary | ICD-10-CM | POA: Diagnosis not present

## 2014-11-26 DIAGNOSIS — E119 Type 2 diabetes mellitus without complications: Secondary | ICD-10-CM | POA: Diagnosis not present

## 2014-11-26 DIAGNOSIS — Z9981 Dependence on supplemental oxygen: Secondary | ICD-10-CM | POA: Diagnosis not present

## 2014-11-26 DIAGNOSIS — Z7901 Long term (current) use of anticoagulants: Secondary | ICD-10-CM | POA: Diagnosis not present

## 2014-11-26 DIAGNOSIS — J441 Chronic obstructive pulmonary disease with (acute) exacerbation: Secondary | ICD-10-CM | POA: Diagnosis not present

## 2014-11-26 NOTE — Telephone Encounter (Signed)
Pt called and canceled appt to see Dr Karel JarvisAquino on 11-28-14 and was referred by the ER no no referring Dr office was notified

## 2014-11-27 DIAGNOSIS — F329 Major depressive disorder, single episode, unspecified: Secondary | ICD-10-CM | POA: Diagnosis not present

## 2014-11-27 DIAGNOSIS — J441 Chronic obstructive pulmonary disease with (acute) exacerbation: Secondary | ICD-10-CM | POA: Diagnosis not present

## 2014-11-27 DIAGNOSIS — Z7901 Long term (current) use of anticoagulants: Secondary | ICD-10-CM | POA: Diagnosis not present

## 2014-11-27 DIAGNOSIS — E119 Type 2 diabetes mellitus without complications: Secondary | ICD-10-CM | POA: Diagnosis not present

## 2014-11-27 DIAGNOSIS — I4891 Unspecified atrial fibrillation: Secondary | ICD-10-CM | POA: Diagnosis not present

## 2014-11-27 DIAGNOSIS — I129 Hypertensive chronic kidney disease with stage 1 through stage 4 chronic kidney disease, or unspecified chronic kidney disease: Secondary | ICD-10-CM | POA: Diagnosis not present

## 2014-11-27 DIAGNOSIS — Z86718 Personal history of other venous thrombosis and embolism: Secondary | ICD-10-CM | POA: Diagnosis not present

## 2014-11-27 DIAGNOSIS — Z794 Long term (current) use of insulin: Secondary | ICD-10-CM | POA: Diagnosis not present

## 2014-11-27 DIAGNOSIS — F419 Anxiety disorder, unspecified: Secondary | ICD-10-CM | POA: Diagnosis not present

## 2014-11-27 DIAGNOSIS — N189 Chronic kidney disease, unspecified: Secondary | ICD-10-CM | POA: Diagnosis not present

## 2014-11-27 DIAGNOSIS — G40909 Epilepsy, unspecified, not intractable, without status epilepticus: Secondary | ICD-10-CM | POA: Diagnosis not present

## 2014-11-27 DIAGNOSIS — E538 Deficiency of other specified B group vitamins: Secondary | ICD-10-CM | POA: Diagnosis not present

## 2014-11-27 DIAGNOSIS — I5022 Chronic systolic (congestive) heart failure: Secondary | ICD-10-CM | POA: Diagnosis not present

## 2014-11-27 DIAGNOSIS — Z9981 Dependence on supplemental oxygen: Secondary | ICD-10-CM | POA: Diagnosis not present

## 2014-11-28 ENCOUNTER — Ambulatory Visit: Payer: Medicare Other | Admitting: Neurology

## 2014-11-28 DIAGNOSIS — I5022 Chronic systolic (congestive) heart failure: Secondary | ICD-10-CM | POA: Diagnosis not present

## 2014-11-28 DIAGNOSIS — Z7901 Long term (current) use of anticoagulants: Secondary | ICD-10-CM | POA: Diagnosis not present

## 2014-11-28 DIAGNOSIS — I129 Hypertensive chronic kidney disease with stage 1 through stage 4 chronic kidney disease, or unspecified chronic kidney disease: Secondary | ICD-10-CM | POA: Diagnosis not present

## 2014-11-28 DIAGNOSIS — Z9981 Dependence on supplemental oxygen: Secondary | ICD-10-CM | POA: Diagnosis not present

## 2014-11-28 DIAGNOSIS — E538 Deficiency of other specified B group vitamins: Secondary | ICD-10-CM | POA: Diagnosis not present

## 2014-11-28 DIAGNOSIS — I4891 Unspecified atrial fibrillation: Secondary | ICD-10-CM | POA: Diagnosis not present

## 2014-11-28 DIAGNOSIS — F329 Major depressive disorder, single episode, unspecified: Secondary | ICD-10-CM | POA: Diagnosis not present

## 2014-11-28 DIAGNOSIS — E119 Type 2 diabetes mellitus without complications: Secondary | ICD-10-CM | POA: Diagnosis not present

## 2014-11-28 DIAGNOSIS — N189 Chronic kidney disease, unspecified: Secondary | ICD-10-CM | POA: Diagnosis not present

## 2014-11-28 DIAGNOSIS — F419 Anxiety disorder, unspecified: Secondary | ICD-10-CM | POA: Diagnosis not present

## 2014-11-28 DIAGNOSIS — Z86718 Personal history of other venous thrombosis and embolism: Secondary | ICD-10-CM | POA: Diagnosis not present

## 2014-11-28 DIAGNOSIS — G40909 Epilepsy, unspecified, not intractable, without status epilepticus: Secondary | ICD-10-CM | POA: Diagnosis not present

## 2014-11-28 DIAGNOSIS — Z794 Long term (current) use of insulin: Secondary | ICD-10-CM | POA: Diagnosis not present

## 2014-11-28 DIAGNOSIS — J441 Chronic obstructive pulmonary disease with (acute) exacerbation: Secondary | ICD-10-CM | POA: Diagnosis not present

## 2014-11-29 DIAGNOSIS — Z86718 Personal history of other venous thrombosis and embolism: Secondary | ICD-10-CM | POA: Diagnosis not present

## 2014-11-29 DIAGNOSIS — I5022 Chronic systolic (congestive) heart failure: Secondary | ICD-10-CM | POA: Diagnosis not present

## 2014-11-29 DIAGNOSIS — Z794 Long term (current) use of insulin: Secondary | ICD-10-CM | POA: Diagnosis not present

## 2014-11-29 DIAGNOSIS — F419 Anxiety disorder, unspecified: Secondary | ICD-10-CM | POA: Diagnosis not present

## 2014-11-29 DIAGNOSIS — I4891 Unspecified atrial fibrillation: Secondary | ICD-10-CM | POA: Diagnosis not present

## 2014-11-29 DIAGNOSIS — G40909 Epilepsy, unspecified, not intractable, without status epilepticus: Secondary | ICD-10-CM | POA: Diagnosis not present

## 2014-11-29 DIAGNOSIS — F329 Major depressive disorder, single episode, unspecified: Secondary | ICD-10-CM | POA: Diagnosis not present

## 2014-11-29 DIAGNOSIS — N189 Chronic kidney disease, unspecified: Secondary | ICD-10-CM | POA: Diagnosis not present

## 2014-11-29 DIAGNOSIS — Z9981 Dependence on supplemental oxygen: Secondary | ICD-10-CM | POA: Diagnosis not present

## 2014-11-29 DIAGNOSIS — E119 Type 2 diabetes mellitus without complications: Secondary | ICD-10-CM | POA: Diagnosis not present

## 2014-11-29 DIAGNOSIS — Z7901 Long term (current) use of anticoagulants: Secondary | ICD-10-CM | POA: Diagnosis not present

## 2014-11-29 DIAGNOSIS — I129 Hypertensive chronic kidney disease with stage 1 through stage 4 chronic kidney disease, or unspecified chronic kidney disease: Secondary | ICD-10-CM | POA: Diagnosis not present

## 2014-11-29 DIAGNOSIS — J441 Chronic obstructive pulmonary disease with (acute) exacerbation: Secondary | ICD-10-CM | POA: Diagnosis not present

## 2014-11-29 DIAGNOSIS — E538 Deficiency of other specified B group vitamins: Secondary | ICD-10-CM | POA: Diagnosis not present

## 2014-12-02 DIAGNOSIS — G40909 Epilepsy, unspecified, not intractable, without status epilepticus: Secondary | ICD-10-CM | POA: Diagnosis not present

## 2014-12-02 DIAGNOSIS — E119 Type 2 diabetes mellitus without complications: Secondary | ICD-10-CM | POA: Diagnosis not present

## 2014-12-02 DIAGNOSIS — I5022 Chronic systolic (congestive) heart failure: Secondary | ICD-10-CM | POA: Diagnosis not present

## 2014-12-02 DIAGNOSIS — N189 Chronic kidney disease, unspecified: Secondary | ICD-10-CM | POA: Diagnosis not present

## 2014-12-02 DIAGNOSIS — I129 Hypertensive chronic kidney disease with stage 1 through stage 4 chronic kidney disease, or unspecified chronic kidney disease: Secondary | ICD-10-CM | POA: Diagnosis not present

## 2014-12-02 DIAGNOSIS — I4891 Unspecified atrial fibrillation: Secondary | ICD-10-CM | POA: Diagnosis not present

## 2014-12-02 DIAGNOSIS — E538 Deficiency of other specified B group vitamins: Secondary | ICD-10-CM | POA: Diagnosis not present

## 2014-12-02 DIAGNOSIS — F419 Anxiety disorder, unspecified: Secondary | ICD-10-CM | POA: Diagnosis not present

## 2014-12-02 DIAGNOSIS — F329 Major depressive disorder, single episode, unspecified: Secondary | ICD-10-CM | POA: Diagnosis not present

## 2014-12-02 DIAGNOSIS — J441 Chronic obstructive pulmonary disease with (acute) exacerbation: Secondary | ICD-10-CM | POA: Diagnosis not present

## 2014-12-02 DIAGNOSIS — Z9981 Dependence on supplemental oxygen: Secondary | ICD-10-CM | POA: Diagnosis not present

## 2014-12-02 DIAGNOSIS — Z7901 Long term (current) use of anticoagulants: Secondary | ICD-10-CM | POA: Diagnosis not present

## 2014-12-02 DIAGNOSIS — Z794 Long term (current) use of insulin: Secondary | ICD-10-CM | POA: Diagnosis not present

## 2014-12-02 DIAGNOSIS — Z86718 Personal history of other venous thrombosis and embolism: Secondary | ICD-10-CM | POA: Diagnosis not present

## 2014-12-04 DIAGNOSIS — J441 Chronic obstructive pulmonary disease with (acute) exacerbation: Secondary | ICD-10-CM | POA: Diagnosis not present

## 2014-12-04 DIAGNOSIS — I5022 Chronic systolic (congestive) heart failure: Secondary | ICD-10-CM | POA: Diagnosis not present

## 2014-12-04 DIAGNOSIS — Z7901 Long term (current) use of anticoagulants: Secondary | ICD-10-CM | POA: Diagnosis not present

## 2014-12-04 DIAGNOSIS — E119 Type 2 diabetes mellitus without complications: Secondary | ICD-10-CM | POA: Diagnosis not present

## 2014-12-04 DIAGNOSIS — N189 Chronic kidney disease, unspecified: Secondary | ICD-10-CM | POA: Diagnosis not present

## 2014-12-04 DIAGNOSIS — F419 Anxiety disorder, unspecified: Secondary | ICD-10-CM | POA: Diagnosis not present

## 2014-12-04 DIAGNOSIS — Z9981 Dependence on supplemental oxygen: Secondary | ICD-10-CM | POA: Diagnosis not present

## 2014-12-04 DIAGNOSIS — G40909 Epilepsy, unspecified, not intractable, without status epilepticus: Secondary | ICD-10-CM | POA: Diagnosis not present

## 2014-12-04 DIAGNOSIS — Z86718 Personal history of other venous thrombosis and embolism: Secondary | ICD-10-CM | POA: Diagnosis not present

## 2014-12-04 DIAGNOSIS — Z794 Long term (current) use of insulin: Secondary | ICD-10-CM | POA: Diagnosis not present

## 2014-12-04 DIAGNOSIS — I129 Hypertensive chronic kidney disease with stage 1 through stage 4 chronic kidney disease, or unspecified chronic kidney disease: Secondary | ICD-10-CM | POA: Diagnosis not present

## 2014-12-04 DIAGNOSIS — E538 Deficiency of other specified B group vitamins: Secondary | ICD-10-CM | POA: Diagnosis not present

## 2014-12-04 DIAGNOSIS — I4891 Unspecified atrial fibrillation: Secondary | ICD-10-CM | POA: Diagnosis not present

## 2014-12-04 DIAGNOSIS — F329 Major depressive disorder, single episode, unspecified: Secondary | ICD-10-CM | POA: Diagnosis not present

## 2014-12-05 ENCOUNTER — Other Ambulatory Visit: Payer: Self-pay | Admitting: *Deleted

## 2014-12-05 DIAGNOSIS — J449 Chronic obstructive pulmonary disease, unspecified: Secondary | ICD-10-CM

## 2014-12-05 MED ORDER — LEVETIRACETAM 500 MG PO TABS: 500.0000 mg | ORAL_TABLET | Freq: Two times a day (BID) | ORAL | Status: AC

## 2014-12-05 MED ORDER — LEVALBUTEROL TARTRATE 45 MCG/ACT IN AERO: INHALATION_SPRAY | RESPIRATORY_TRACT | Status: AC

## 2014-12-05 MED ORDER — LEVALBUTEROL HCL 1.25 MG/3ML IN NEBU: INHALATION_SOLUTION | RESPIRATORY_TRACT | Status: AC

## 2014-12-08 DIAGNOSIS — J449 Chronic obstructive pulmonary disease, unspecified: Secondary | ICD-10-CM | POA: Diagnosis not present

## 2014-12-08 DIAGNOSIS — J4520 Mild intermittent asthma, uncomplicated: Secondary | ICD-10-CM | POA: Diagnosis not present

## 2014-12-10 DIAGNOSIS — F329 Major depressive disorder, single episode, unspecified: Secondary | ICD-10-CM | POA: Diagnosis not present

## 2014-12-10 DIAGNOSIS — Z86718 Personal history of other venous thrombosis and embolism: Secondary | ICD-10-CM | POA: Diagnosis not present

## 2014-12-10 DIAGNOSIS — F419 Anxiety disorder, unspecified: Secondary | ICD-10-CM | POA: Diagnosis not present

## 2014-12-10 DIAGNOSIS — I4891 Unspecified atrial fibrillation: Secondary | ICD-10-CM | POA: Diagnosis not present

## 2014-12-10 DIAGNOSIS — E538 Deficiency of other specified B group vitamins: Secondary | ICD-10-CM | POA: Diagnosis not present

## 2014-12-10 DIAGNOSIS — Z7901 Long term (current) use of anticoagulants: Secondary | ICD-10-CM | POA: Diagnosis not present

## 2014-12-10 DIAGNOSIS — N189 Chronic kidney disease, unspecified: Secondary | ICD-10-CM | POA: Diagnosis not present

## 2014-12-10 DIAGNOSIS — E119 Type 2 diabetes mellitus without complications: Secondary | ICD-10-CM | POA: Diagnosis not present

## 2014-12-10 DIAGNOSIS — G40909 Epilepsy, unspecified, not intractable, without status epilepticus: Secondary | ICD-10-CM | POA: Diagnosis not present

## 2014-12-10 DIAGNOSIS — I5022 Chronic systolic (congestive) heart failure: Secondary | ICD-10-CM | POA: Diagnosis not present

## 2014-12-10 DIAGNOSIS — Z794 Long term (current) use of insulin: Secondary | ICD-10-CM | POA: Diagnosis not present

## 2014-12-10 DIAGNOSIS — I129 Hypertensive chronic kidney disease with stage 1 through stage 4 chronic kidney disease, or unspecified chronic kidney disease: Secondary | ICD-10-CM | POA: Diagnosis not present

## 2014-12-10 DIAGNOSIS — Z9981 Dependence on supplemental oxygen: Secondary | ICD-10-CM | POA: Diagnosis not present

## 2014-12-10 DIAGNOSIS — J441 Chronic obstructive pulmonary disease with (acute) exacerbation: Secondary | ICD-10-CM | POA: Diagnosis not present

## 2014-12-12 DIAGNOSIS — J441 Chronic obstructive pulmonary disease with (acute) exacerbation: Secondary | ICD-10-CM | POA: Diagnosis not present

## 2014-12-12 DIAGNOSIS — E538 Deficiency of other specified B group vitamins: Secondary | ICD-10-CM | POA: Diagnosis not present

## 2014-12-12 DIAGNOSIS — I129 Hypertensive chronic kidney disease with stage 1 through stage 4 chronic kidney disease, or unspecified chronic kidney disease: Secondary | ICD-10-CM | POA: Diagnosis not present

## 2014-12-12 DIAGNOSIS — N189 Chronic kidney disease, unspecified: Secondary | ICD-10-CM | POA: Diagnosis not present

## 2014-12-12 DIAGNOSIS — I5022 Chronic systolic (congestive) heart failure: Secondary | ICD-10-CM | POA: Diagnosis not present

## 2014-12-12 DIAGNOSIS — Z9981 Dependence on supplemental oxygen: Secondary | ICD-10-CM | POA: Diagnosis not present

## 2014-12-12 DIAGNOSIS — G40909 Epilepsy, unspecified, not intractable, without status epilepticus: Secondary | ICD-10-CM | POA: Diagnosis not present

## 2014-12-12 DIAGNOSIS — Z86718 Personal history of other venous thrombosis and embolism: Secondary | ICD-10-CM | POA: Diagnosis not present

## 2014-12-12 DIAGNOSIS — F419 Anxiety disorder, unspecified: Secondary | ICD-10-CM | POA: Diagnosis not present

## 2014-12-12 DIAGNOSIS — E119 Type 2 diabetes mellitus without complications: Secondary | ICD-10-CM | POA: Diagnosis not present

## 2014-12-12 DIAGNOSIS — F329 Major depressive disorder, single episode, unspecified: Secondary | ICD-10-CM | POA: Diagnosis not present

## 2014-12-12 DIAGNOSIS — Z7901 Long term (current) use of anticoagulants: Secondary | ICD-10-CM | POA: Diagnosis not present

## 2014-12-12 DIAGNOSIS — Z794 Long term (current) use of insulin: Secondary | ICD-10-CM | POA: Diagnosis not present

## 2014-12-12 DIAGNOSIS — I4891 Unspecified atrial fibrillation: Secondary | ICD-10-CM | POA: Diagnosis not present

## 2014-12-20 ENCOUNTER — Telehealth: Payer: Self-pay | Admitting: *Deleted

## 2014-12-20 ENCOUNTER — Other Ambulatory Visit: Payer: Self-pay | Admitting: *Deleted

## 2014-12-20 MED ORDER — IPRATROPIUM BROMIDE 0.06 % NA SOLN: 2.0000 | Freq: Two times a day (BID) | NASAL | Status: AC

## 2014-12-20 NOTE — Telephone Encounter (Signed)
Received a written message from the front staff stating patient called with c/o nasal congestion and runny nose.  Patient asking if there is any rx she can try to help with these symptoms.  Per Dr. Kathryne SharperMcKeown's orders, Rx for Atrovent sent into pharmacy for patient to try.  Advised f/u ov for further eval if no relief with nasal spray Rx.

## 2014-12-26 ENCOUNTER — Encounter: Payer: Self-pay | Admitting: Internal Medicine

## 2014-12-26 ENCOUNTER — Ambulatory Visit (INDEPENDENT_AMBULATORY_CARE_PROVIDER_SITE_OTHER): Payer: Medicare Other | Admitting: Internal Medicine

## 2014-12-26 VITALS — BP 116/62 | HR 72 | Temp 97.3°F | Resp 24

## 2014-12-26 DIAGNOSIS — J449 Chronic obstructive pulmonary disease, unspecified: Secondary | ICD-10-CM

## 2014-12-26 DIAGNOSIS — I482 Chronic atrial fibrillation, unspecified: Secondary | ICD-10-CM

## 2014-12-26 DIAGNOSIS — Z79899 Other long term (current) drug therapy: Secondary | ICD-10-CM

## 2014-12-26 DIAGNOSIS — I150 Renovascular hypertension: Secondary | ICD-10-CM | POA: Diagnosis not present

## 2014-12-26 LAB — BASIC METABOLIC PANEL WITH GFR
BUN: 11 mg/dL (ref 6–23)
CALCIUM: 9.2 mg/dL (ref 8.4–10.5)
CHLORIDE: 101 meq/L (ref 96–112)
CO2: 30 mEq/L (ref 19–32)
Creat: 1.29 mg/dL — ABNORMAL HIGH (ref 0.50–1.10)
GFR, EST NON AFRICAN AMERICAN: 39 mL/min — AB
GFR, Est African American: 45 mL/min — ABNORMAL LOW
Glucose, Bld: 209 mg/dL — ABNORMAL HIGH (ref 70–99)
Potassium: 4 mEq/L (ref 3.5–5.3)
SODIUM: 143 meq/L (ref 135–145)

## 2014-12-26 LAB — CBC WITH DIFFERENTIAL/PLATELET
BASOS PCT: 0 % (ref 0–1)
Basophils Absolute: 0 10*3/uL (ref 0.0–0.1)
EOS ABS: 0.1 10*3/uL (ref 0.0–0.7)
Eosinophils Relative: 1 % (ref 0–5)
HCT: 37.3 % (ref 36.0–46.0)
Hemoglobin: 11.1 g/dL — ABNORMAL LOW (ref 12.0–15.0)
Lymphocytes Relative: 5 % — ABNORMAL LOW (ref 12–46)
Lymphs Abs: 0.6 10*3/uL — ABNORMAL LOW (ref 0.7–4.0)
MCH: 26.2 pg (ref 26.0–34.0)
MCHC: 29.8 g/dL — ABNORMAL LOW (ref 30.0–36.0)
MCV: 88 fL (ref 78.0–100.0)
MONOS PCT: 5 % (ref 3–12)
MPV: 11.4 fL (ref 8.6–12.4)
Monocytes Absolute: 0.6 10*3/uL (ref 0.1–1.0)
NEUTROS PCT: 89 % — AB (ref 43–77)
Neutro Abs: 10.2 10*3/uL — ABNORMAL HIGH (ref 1.7–7.7)
Platelets: 177 10*3/uL (ref 150–400)
RBC: 4.24 MIL/uL (ref 3.87–5.11)
RDW: 15 % (ref 11.5–15.5)
WBC: 11.5 10*3/uL — ABNORMAL HIGH (ref 4.0–10.5)

## 2014-12-26 NOTE — Progress Notes (Signed)
Subjective:    Patient ID: [name], female    DOB: [birth year], 81 y.o.   MRN: [id number]  HPI Patient returns 1 month for f/u of diabetes after a march hospitalization for AECB during which time she was started on insulin. Over the the last month per instructions she was tapered off of Lantis and back onto Metformin with FBG's running in the 110-130 mg% range. She continues to remain severely compromised by her severe end stage COPD and is minimally active in her home and monitored frequently thru out the day by her children. In fact her daughter [name] asked today about involving Hospice in her mother's care and evident declining status. Pt continues on 5-6 liters nasal O2 achieving O2 sats in the mid-upper 80's % range. No significant cough or sputum pdn.   Medication Sig  . apixaban (ELIQUIS) 5 MG TABS tablet Take 0.5 tablets (2.5 mg total) by mouth 2 (two) times daily.  Marland Kitchen. atorvastatin (LIPITOR) 40 MG tablet   . budesonide (PULMICORT) 0.25 MG/2ML nebulizer solution Take 2 mLs (0.25 mg total) by nebulization every 6 (six) hours.  . Cholecalciferol (VITAMIN D) 2000 UNITS tablet Take 5,000 Units by mouth daily.   Marland Kitchen. dexlansoprazole (DEXILANT) 60 MG capsule Take 60 mg by mouth daily.  . furosemide (LASIX) 20 MG tablet Take 1 tablet (20 mg total) by mouth daily.  . Insulin Glargine (LANTUS SOLOSTAR) 100 UNIT/ML Solostar Pen Inject 14 Units into the skin daily at 10 pm.  . Insulin Pen Needle 29G X 12.7MM MISC 14 Units by Does not apply route at bedtime.  Marland Kitchen. ipratropium (ATROVENT) 0.06 % nasal spray Place 2 sprays into the nose 2 (two) times daily.  Marland Kitchen. levalbuterol (XOPENEX HFA) 45 MCG/ACT inhaler Take 1 - 2 puffs -  5 minutes apart  - 4 x day or every 4 hours as needed to rescue Asthma  . levalbuterol (XOPENEX) 1.25 MG/3ML nebulizer solution 1 neb treatment 4 x day  . levETIRAcetam (KEPPRA) 500 MG tablet Take 1 tablet (500 mg total) by mouth 2 (two) times daily.  Marland Kitchen. linagliptin (TRADJENTA) 5 MG  TABS tablet Take 1 tablet (5 mg total) by mouth daily.  . montelukast (SINGULAIR) 10 MG tablet Take 10 mg by mouth at bedtime.  . ONE TOUCH ULTRA TEST test strip USE TO CHECK BLOOD GLUCOSE ONCE A DAY AS INSTRUCTED  . PARoxetine (PAXIL) 20 MG tablet Take 10 mg by mouth daily.   . potassium chloride SA (K-DUR,KLOR-CON) 20 MEQ tablet   . predniSONE (DELTASONE) 10 MG tablet Take 1 tablet (10 mg total) by mouth daily with breakfast.  . simvastatin (ZOCOR) 80 MG tablet Take 80 mg by mouth as directed. Take 1/2 tablet M,W,F  . tiotropium (SPIRIVA) 18 MCG inhalation capsule Place 18 mcg into inhaler and inhale daily.  . vitamin B-12 (CYANOCOBALAMIN) 1000 MCG tablet Take 1,000 mcg by mouth daily.   Allergies  Allergen Reactions  . Advair Diskus [Fluticasone-Salmeterol]     Itching  . Biaxin [Clarithromycin]     N/V  . Ciprofloxacin     N/V  . Doxycycline Other (See Comments)    Inside of mouth and tongue red and peeled  . Keflex [Cephalexin]     N/V  . Ketek [Telithromycin]     N/V  . Levaquin [Levofloxacin In D5w] Other (See Comments)    Arrythmia, polymorphic Vtach on 10/15 hospitalization  . Macrobid [Nitrofurantoin]     N/V  . Penicillins     Rash  .  Sulfonamide Derivatives    Past Medical History  Diagnosis Date  . AV block, complete   . CHF (congestive heart failure)   . AAA (abdominal aortic aneurysm)   . Dyslipidemia   . History of colonic polyps   . Anxiety and depression   . Diverticulosis of colon   . Asthma   . Acute renal failure   . Atrial flutter   . Hyperglycemia   . UTI (lower urinary tract infection)   . COPD (chronic obstructive pulmonary disease)   . HTN (hypertension)   . DM (diabetes mellitus)   . GERD (gastroesophageal reflux disease)   . DVT (deep venous thrombosis)   . B12 deficiency   . Angiodysplasia of intestine (without mention of hemorrhage)    Past Surgical History  Procedure Laterality Date  . Tubal ligation    . Abdominal hysterectomy     . Appendectomy    . Pacemaker insertion      medtronic   . Colonoscopy  02/06/2002    409.81,[id number],562.10  . Bladder suspension     Review of Systems 10 point systems review negative except as above.     Objective:   Physical Exam   BP 116/62 mmHg  Pulse 72  Temp(Src) 97.3 F (36.3 C)  Resp 24  Very pale, but w/o cyanosis.   HEENT - Eac's patent. TM's Nl. EOM's full. PERRLA. NasoOroPharynx clear. Neck - supple. Nl Thyroid. Carotids 2+ & No bruits, nodes, JVD Chest - Very distant  BS w/o Rales, rhonchi, wheezes. Cor -HS very soft and sl irreg RR w/o sig MGR. PP 1(+). No edema. MS- FROM w/o deformities. Muscle power, tone and bulk decreased. In wheelchair. Neuro - No obvious Cr N abnormalities. Sensory, motor and Cerebellar functions appear Nl w/o focal abnormalities. Psyche - Mental status normal & appropriate.  No delusions, ideations or obvious mood abnormalities.    Assessment & Plan:   1. Renovascular hypertension   2. Chronic obstructive airway disease with asthma   3. Chronic atrial fibrillation  - on Eliquis  4. Medication management  - CBC with Differential/Platelet - BASIC METABOLIC PANEL WITH GFR   - Discussed meds/SE's. Daughter to broach topic of Hospice with her mother.

## 2015-01-06 ENCOUNTER — Other Ambulatory Visit: Payer: Self-pay | Admitting: Internal Medicine

## 2015-01-07 DIAGNOSIS — J4520 Mild intermittent asthma, uncomplicated: Secondary | ICD-10-CM | POA: Diagnosis not present

## 2015-01-07 DIAGNOSIS — J449 Chronic obstructive pulmonary disease, unspecified: Secondary | ICD-10-CM | POA: Diagnosis not present

## 2015-01-14 ENCOUNTER — Ambulatory Visit: Payer: Self-pay | Admitting: Physician Assistant

## 2015-02-05 ENCOUNTER — Ambulatory Visit: Payer: Medicare Other | Admitting: Internal Medicine

## 2015-02-05 NOTE — Progress Notes (Signed)
Patient ID: [name], female   DOB: 06-17-1932, 81 y.o.   MRN: [id number] [name]

## 2015-02-07 DIAGNOSIS — J449 Chronic obstructive pulmonary disease, unspecified: Secondary | ICD-10-CM | POA: Diagnosis not present

## 2015-02-07 DIAGNOSIS — J4520 Mild intermittent asthma, uncomplicated: Secondary | ICD-10-CM | POA: Diagnosis not present

## 2015-02-17 ENCOUNTER — Other Ambulatory Visit: Payer: Self-pay

## 2015-02-17 MED ORDER — MONTELUKAST SODIUM 10 MG PO TABS: 10.0000 mg | ORAL_TABLET | Freq: Every day | ORAL | Status: AC

## 2015-02-27 ENCOUNTER — Encounter: Payer: Medicare Other | Admitting: Internal Medicine

## 2015-02-27 ENCOUNTER — Ambulatory Visit: Payer: Medicare Other | Admitting: Internal Medicine

## 2015-02-27 ENCOUNTER — Other Ambulatory Visit: Payer: Self-pay

## 2015-03-09 DIAGNOSIS — J449 Chronic obstructive pulmonary disease, unspecified: Secondary | ICD-10-CM | POA: Diagnosis not present

## 2015-03-09 DIAGNOSIS — J4520 Mild intermittent asthma, uncomplicated: Secondary | ICD-10-CM | POA: Diagnosis not present

## 2015-03-14 ENCOUNTER — Encounter: Payer: Self-pay | Admitting: Internal Medicine

## 2015-03-14 ENCOUNTER — Ambulatory Visit (INDEPENDENT_AMBULATORY_CARE_PROVIDER_SITE_OTHER): Payer: Medicare Other | Admitting: Internal Medicine

## 2015-03-14 ENCOUNTER — Telehealth: Payer: Self-pay | Admitting: Cardiovascular Disease

## 2015-03-14 VITALS — BP 124/70 | HR 70 | Ht 63.0 in

## 2015-03-14 DIAGNOSIS — I442 Atrioventricular block, complete: Secondary | ICD-10-CM

## 2015-03-14 DIAGNOSIS — I482 Chronic atrial fibrillation, unspecified: Secondary | ICD-10-CM

## 2015-03-14 DIAGNOSIS — I5022 Chronic systolic (congestive) heart failure: Secondary | ICD-10-CM

## 2015-03-14 DIAGNOSIS — Z950 Presence of cardiac pacemaker: Secondary | ICD-10-CM

## 2015-03-14 DIAGNOSIS — J9621 Acute and chronic respiratory failure with hypoxia: Secondary | ICD-10-CM

## 2015-03-14 DIAGNOSIS — I5032 Chronic diastolic (congestive) heart failure: Secondary | ICD-10-CM | POA: Diagnosis not present

## 2015-03-14 DIAGNOSIS — I10 Essential (primary) hypertension: Secondary | ICD-10-CM

## 2015-03-14 LAB — CUP PACEART INCLINIC DEVICE CHECK
Battery Remaining Longevity: 40 mo
Battery Voltage: 2.95 V
Brady Statistic RV Percent Paced: 98 %
Date Time Interrogation Session: 20160722175415
Lead Channel Impedance Value: 514 Ohm
Lead Channel Pacing Threshold Amplitude: 0.5 V
Lead Channel Setting Pacing Amplitude: 2.5 V
Lead Channel Setting Pacing Pulse Width: 0.4 ms
Lead Channel Setting Sensing Sensitivity: 2.8 mV
MDC IDC MSMT LEADCHNL LV IMPEDANCE VALUE: 0 Ohm
MDC IDC MSMT LEADCHNL RA IMPEDANCE VALUE: 0 Ohm
MDC IDC MSMT LEADCHNL RV PACING THRESHOLD PULSEWIDTH: 0.4 ms

## 2015-03-14 MED ORDER — AZITHROMYCIN 250 MG PO TABS: ORAL_TABLET | ORAL | Status: DC

## 2015-03-14 NOTE — Patient Instructions (Signed)
Medication Instructions:  Your physician recommends that you continue on your current medications as directed. Please refer to the Current Medication list given to you today. Will call in a Zpack today for cough    Labwork: None ordered  Testing/Procedures: None ordered  Follow-Up: Your physician recommends that you schedule a follow-up appointment in: 3 months with Dr Ladona Ridgel   Any Other Special Instructions Will Be Listed Below (If Applicable).

## 2015-03-14 NOTE — Assessment & Plan Note (Signed)
Her blood pressure is controlled. No change in her meds.  

## 2015-03-14 NOTE — Assessment & Plan Note (Signed)
The patient appears worse today. I have given her a prescription for azithromycin. Her oxygen concentrator did not appear to be working correctly and she will go home with another oxygen tank, provided in our office before she leaves so that she can return home. She is very close to the end of her life. I have recommended hospice referral and counseled her on this.

## 2015-03-14 NOTE — Progress Notes (Signed)
HPI [name] returns today for followup. She is a pleasant and 81 year old woman with a history of chronic systolic heart failure, complete heart block, hypertension, and COPD on home oxygen. In the interim, she has been getting progressively worse with increased fatigue and weakness and sob. No syncope. Her left ventricular lead was turned off secondary to diaphragmatic stimulation a year ago.  Now she notes that with any exertion, her breath gets short and she has to stop what she is doing. She has had some problems with her oxygen concentrator. In our office her oxygen levels with pulse oximetry was in the 60's and when we switched her to a tank, went up to the high 80's. Finally, she notes a fever 2 days ago with productice sputum in her cough.  No other complaints. Allergies  Allergen Reactions  . Advair Diskus [Fluticasone-Salmeterol]     Itching  . Biaxin [Clarithromycin]     N/V  . Ciprofloxacin     N/V  . Doxycycline Other (See Comments)    Inside of mouth and tongue red and peeled  . Keflex [Cephalexin]     N/V  . Ketek [Telithromycin]     N/V  . Levaquin [Levofloxacin In D5w] Other (See Comments)    Arrythmia, polymorphic Vtach on 10/15 hospitalization  . Macrobid [Nitrofurantoin]     N/V  . Penicillins     Rash  . Sulfonamide Derivatives     unknown     Current Outpatient Prescriptions  Medication Sig Dispense Refill  . apixaban (ELIQUIS) 5 MG TABS tablet Take 0.5 tablets (2.5 mg total) by mouth 2 (two) times daily. 60 tablet   . Cholecalciferol (VITAMIN D) 2000 UNITS tablet Take 5,000 Units by mouth daily.     Marland Kitchen dexlansoprazole (DEXILANT) 60 MG capsule Take 60 mg by mouth daily.    . furosemide (LASIX) 40 MG tablet Take 20 mg by mouth daily.    Marland Kitchen ipratropium (ATROVENT) 0.06 % nasal spray Place 2 sprays into the nose 2 (two) times daily. 15 mL 2  . levalbuterol (XOPENEX HFA) 45 MCG/ACT inhaler Take 1 - 2 puffs -  5 minutes apart  - 4 x day or every 4 hours as needed to  rescue Asthma 1 Inhaler 99  . levalbuterol (XOPENEX) 1.25 MG/3ML nebulizer solution 1 neb treatment 4 x day 360 mL 12  . levETIRAcetam (KEPPRA) 500 MG tablet Take 1 tablet (500 mg total) by mouth 2 (two) times daily. 60 tablet 5  . linagliptin (TRADJENTA) 5 MG TABS tablet Take 1 tablet (5 mg total) by mouth daily. 30 tablet 0  . metFORMIN (GLUCOPHAGE-XR) 500 MG 24 hr tablet Take 500 mg by mouth daily.    . montelukast (SINGULAIR) 10 MG tablet Take 1 tablet (10 mg total) by mouth at bedtime. 30 tablet prn  . ONE TOUCH ULTRA TEST test strip USE TO CHECK BLOOD GLUCOSE ONCE A DAY AS INSTRUCTED 100 each 99  . PARoxetine (PAXIL) 20 MG tablet Take 10 mg by mouth daily.    . predniSONE (DELTASONE) 10 MG tablet TAKE 1 TABLET BY MOUTH ONCE A DAY TO THREE TIMES A DAY OR AS INSTRUCTED 100 tablet 1  . simvastatin (ZOCOR) 80 MG tablet Take 1/2 tablet by mouth M,W,F    . tiotropium (SPIRIVA) 18 MCG inhalation capsule Place 18 mcg into inhaler and inhale daily.    Marland Kitchen azithromycin (ZITHROMAX Z-PAK) 250 MG tablet Take 2 the first day then one daily until finished 6 each 0  No current facility-administered medications for this visit.     Past Medical History  Diagnosis Date  . AV block, complete   . CHF (congestive heart failure)   . AAA (abdominal aortic aneurysm)   . Dyslipidemia   . History of colonic polyps   . Anxiety and depression   . Diverticulosis of colon   . Asthma   . Acute renal failure   . Atrial flutter   . Hyperglycemia   . UTI (lower urinary tract infection)   . COPD (chronic obstructive pulmonary disease)   . HTN (hypertension)   . DM (diabetes mellitus)   . GERD (gastroesophageal reflux disease)   . DVT (deep venous thrombosis)   . B12 deficiency   . Angiodysplasia of intestine (without mention of hemorrhage)     ROS:   All systems reviewed and negative except as noted in the HPI.   Past Surgical History  Procedure Laterality Date  . Tubal ligation    . Abdominal  hysterectomy    . Appendectomy    . Pacemaker insertion      medtronic   . Colonoscopy  02/06/2002    409.81,191.47  . Bladder suspension       Family History  Problem Relation Age of Onset  . Emphysema Maternal Uncle     multiple   . Asthma Daughter   . Colon cancer Sister     alive at 82   . Heart disease Father   . CVA Mother   . Heart disease Mother      History   Social History  . Marital Status: Widowed    Spouse Name: N/A  . Number of Children: N/A  . Years of Education: N/A   Occupational History  . Not on file.   Social History Main Topics  . Smoking status: Former Smoker    Start date: 11/30/1953    Quit date: 08/23/1988  . Smokeless tobacco: Never Used     Comment: quit in 1990, smoked 1 ppd x 30 years   . Alcohol Use: No  . Drug Use: No  . Sexual Activity: Not on file   Other Topics Concern  . Not on file   Social History Narrative   Married, 4 children; retired Associate Professor.      BP 124/70 mmHg  Pulse 70  Ht 5\' 3"  (1.6 m)  SpO2 85%  Physical Exam:  Anxious and chronically ill appearing 81 year old woman, NAD HEENT: Unremarkable Neck:  7 cm JVD, no thyromegally Lungs:  Scattered rales in the bases with no wheezes HEART:  Regular rate rhythm, no murmurs, no rubs, no clicks Abd:  soft, positive bowel sounds, no organomegally, no rebound, no guarding Ext:  2 plus pulses, no edema, no cyanosis, no clubbing Skin:  No rashes no nodules Neuro:  CN II through XII intact, motor grossly intact  DEVICE  Normal device function.  See PaceArt for details. Left ventricular lead was reprogrammed to allow for LV pacing without diaphragmatic stimulation. Her QRS duration improved by 25 ms. . Underlying rhythm is atrial fib with no  VR.  Assess/Plan:

## 2015-03-14 NOTE — Assessment & Plan Note (Signed)
We have reprogrammed her device to allow for LV capture and she does not appear to have diaphragmatic stimulation. Will follow.

## 2015-03-24 ENCOUNTER — Encounter: Payer: Self-pay | Admitting: Internal Medicine

## 2015-04-09 DIAGNOSIS — J449 Chronic obstructive pulmonary disease, unspecified: Secondary | ICD-10-CM | POA: Diagnosis not present

## 2015-04-09 DIAGNOSIS — J4520 Mild intermittent asthma, uncomplicated: Secondary | ICD-10-CM | POA: Diagnosis not present

## 2015-04-25 ENCOUNTER — Other Ambulatory Visit: Payer: Self-pay | Admitting: *Deleted

## 2015-04-25 MED ORDER — DEXLANSOPRAZOLE 60 MG PO CPDR: 60.0000 mg | DELAYED_RELEASE_CAPSULE | Freq: Every day | ORAL | Status: AC

## 2015-05-06 ENCOUNTER — Emergency Department (HOSPITAL_COMMUNITY): Payer: Medicare Other

## 2015-05-06 ENCOUNTER — Encounter: Payer: Self-pay | Admitting: Internal Medicine

## 2015-05-06 ENCOUNTER — Inpatient Hospital Stay (HOSPITAL_COMMUNITY)
Admission: EM | Admit: 2015-05-06 | Discharge: 2015-05-24 | DRG: 291 | Disposition: E | Payer: Medicare Other | Attending: Internal Medicine | Admitting: Internal Medicine

## 2015-05-06 ENCOUNTER — Inpatient Hospital Stay (HOSPITAL_COMMUNITY): Payer: Medicare Other

## 2015-05-06 ENCOUNTER — Ambulatory Visit (INDEPENDENT_AMBULATORY_CARE_PROVIDER_SITE_OTHER): Payer: Medicare Other | Admitting: Internal Medicine

## 2015-05-06 ENCOUNTER — Encounter (HOSPITAL_COMMUNITY): Payer: Self-pay | Admitting: *Deleted

## 2015-05-06 ENCOUNTER — Other Ambulatory Visit: Payer: Self-pay

## 2015-05-06 VITALS — BP 110/64 | HR 100 | Temp 97.5°F | Resp 20

## 2015-05-06 DIAGNOSIS — T502X5A Adverse effect of carbonic-anhydrase inhibitors, benzothiadiazides and other diuretics, initial encounter: Secondary | ICD-10-CM | POA: Diagnosis not present

## 2015-05-06 DIAGNOSIS — M40209 Unspecified kyphosis, site unspecified: Secondary | ICD-10-CM | POA: Diagnosis present

## 2015-05-06 DIAGNOSIS — N179 Acute kidney failure, unspecified: Secondary | ICD-10-CM | POA: Diagnosis present

## 2015-05-06 DIAGNOSIS — R17 Unspecified jaundice: Secondary | ICD-10-CM | POA: Diagnosis not present

## 2015-05-06 DIAGNOSIS — R7989 Other specified abnormal findings of blood chemistry: Secondary | ICD-10-CM | POA: Diagnosis present

## 2015-05-06 DIAGNOSIS — J9621 Acute and chronic respiratory failure with hypoxia: Secondary | ICD-10-CM | POA: Diagnosis present

## 2015-05-06 DIAGNOSIS — E1151 Type 2 diabetes mellitus with diabetic peripheral angiopathy without gangrene: Secondary | ICD-10-CM | POA: Diagnosis not present

## 2015-05-06 DIAGNOSIS — R569 Unspecified convulsions: Secondary | ICD-10-CM | POA: Diagnosis present

## 2015-05-06 DIAGNOSIS — E1165 Type 2 diabetes mellitus with hyperglycemia: Secondary | ICD-10-CM | POA: Diagnosis not present

## 2015-05-06 DIAGNOSIS — I517 Cardiomegaly: Secondary | ICD-10-CM | POA: Diagnosis not present

## 2015-05-06 DIAGNOSIS — J431 Panlobular emphysema: Secondary | ICD-10-CM | POA: Diagnosis not present

## 2015-05-06 DIAGNOSIS — R1011 Right upper quadrant pain: Secondary | ICD-10-CM | POA: Diagnosis not present

## 2015-05-06 DIAGNOSIS — R0902 Hypoxemia: Secondary | ICD-10-CM | POA: Diagnosis not present

## 2015-05-06 DIAGNOSIS — Z9981 Dependence on supplemental oxygen: Secondary | ICD-10-CM

## 2015-05-06 DIAGNOSIS — Z7952 Long term (current) use of systemic steroids: Secondary | ICD-10-CM

## 2015-05-06 DIAGNOSIS — I4891 Unspecified atrial fibrillation: Secondary | ICD-10-CM | POA: Diagnosis not present

## 2015-05-06 DIAGNOSIS — I482 Chronic atrial fibrillation, unspecified: Secondary | ICD-10-CM | POA: Diagnosis present

## 2015-05-06 DIAGNOSIS — K219 Gastro-esophageal reflux disease without esophagitis: Secondary | ICD-10-CM | POA: Diagnosis not present

## 2015-05-06 DIAGNOSIS — N184 Chronic kidney disease, stage 4 (severe): Secondary | ICD-10-CM | POA: Diagnosis not present

## 2015-05-06 DIAGNOSIS — J449 Chronic obstructive pulmonary disease, unspecified: Secondary | ICD-10-CM | POA: Diagnosis not present

## 2015-05-06 DIAGNOSIS — E785 Hyperlipidemia, unspecified: Secondary | ICD-10-CM | POA: Diagnosis present

## 2015-05-06 DIAGNOSIS — J9811 Atelectasis: Secondary | ICD-10-CM | POA: Diagnosis not present

## 2015-05-06 DIAGNOSIS — Z87891 Personal history of nicotine dependence: Secondary | ICD-10-CM | POA: Diagnosis not present

## 2015-05-06 DIAGNOSIS — Z66 Do not resuscitate: Secondary | ICD-10-CM | POA: Diagnosis present

## 2015-05-06 DIAGNOSIS — I129 Hypertensive chronic kidney disease with stage 1 through stage 4 chronic kidney disease, or unspecified chronic kidney disease: Secondary | ICD-10-CM | POA: Diagnosis not present

## 2015-05-06 DIAGNOSIS — J4520 Mild intermittent asthma, uncomplicated: Secondary | ICD-10-CM | POA: Diagnosis not present

## 2015-05-06 DIAGNOSIS — Z515 Encounter for palliative care: Secondary | ICD-10-CM

## 2015-05-06 DIAGNOSIS — R0600 Dyspnea, unspecified: Secondary | ICD-10-CM

## 2015-05-06 DIAGNOSIS — N183 Chronic kidney disease, stage 3 unspecified: Secondary | ICD-10-CM | POA: Diagnosis present

## 2015-05-06 DIAGNOSIS — E1122 Type 2 diabetes mellitus with diabetic chronic kidney disease: Secondary | ICD-10-CM | POA: Diagnosis present

## 2015-05-06 DIAGNOSIS — E875 Hyperkalemia: Secondary | ICD-10-CM | POA: Diagnosis not present

## 2015-05-06 DIAGNOSIS — Z7901 Long term (current) use of anticoagulants: Secondary | ICD-10-CM | POA: Diagnosis not present

## 2015-05-06 DIAGNOSIS — D649 Anemia, unspecified: Secondary | ICD-10-CM | POA: Diagnosis not present

## 2015-05-06 DIAGNOSIS — E538 Deficiency of other specified B group vitamins: Secondary | ICD-10-CM | POA: Diagnosis present

## 2015-05-06 DIAGNOSIS — I10 Essential (primary) hypertension: Secondary | ICD-10-CM | POA: Diagnosis present

## 2015-05-06 DIAGNOSIS — J984 Other disorders of lung: Secondary | ICD-10-CM | POA: Diagnosis not present

## 2015-05-06 DIAGNOSIS — Z7982 Long term (current) use of aspirin: Secondary | ICD-10-CM | POA: Diagnosis not present

## 2015-05-06 DIAGNOSIS — E876 Hypokalemia: Secondary | ICD-10-CM | POA: Diagnosis present

## 2015-05-06 DIAGNOSIS — J45909 Unspecified asthma, uncomplicated: Secondary | ICD-10-CM | POA: Diagnosis present

## 2015-05-06 DIAGNOSIS — I5043 Acute on chronic combined systolic (congestive) and diastolic (congestive) heart failure: Secondary | ICD-10-CM | POA: Diagnosis present

## 2015-05-06 DIAGNOSIS — D72829 Elevated white blood cell count, unspecified: Secondary | ICD-10-CM

## 2015-05-06 DIAGNOSIS — R0689 Other abnormalities of breathing: Secondary | ICD-10-CM | POA: Diagnosis not present

## 2015-05-06 DIAGNOSIS — R0602 Shortness of breath: Secondary | ICD-10-CM

## 2015-05-06 DIAGNOSIS — I82512 Chronic embolism and thrombosis of left femoral vein: Secondary | ICD-10-CM | POA: Diagnosis not present

## 2015-05-06 DIAGNOSIS — Z950 Presence of cardiac pacemaker: Secondary | ICD-10-CM

## 2015-05-06 DIAGNOSIS — R05 Cough: Secondary | ICD-10-CM | POA: Diagnosis not present

## 2015-05-06 DIAGNOSIS — K769 Liver disease, unspecified: Secondary | ICD-10-CM | POA: Diagnosis not present

## 2015-05-06 DIAGNOSIS — I714 Abdominal aortic aneurysm, without rupture: Secondary | ICD-10-CM | POA: Diagnosis not present

## 2015-05-06 DIAGNOSIS — R069 Unspecified abnormalities of breathing: Secondary | ICD-10-CM | POA: Diagnosis not present

## 2015-05-06 DIAGNOSIS — J810 Acute pulmonary edema: Secondary | ICD-10-CM | POA: Diagnosis not present

## 2015-05-06 DIAGNOSIS — E1121 Type 2 diabetes mellitus with diabetic nephropathy: Secondary | ICD-10-CM | POA: Diagnosis present

## 2015-05-06 DIAGNOSIS — J441 Chronic obstructive pulmonary disease with (acute) exacerbation: Secondary | ICD-10-CM | POA: Diagnosis present

## 2015-05-06 DIAGNOSIS — R140 Abdominal distension (gaseous): Secondary | ICD-10-CM | POA: Diagnosis not present

## 2015-05-06 LAB — CBC
HEMATOCRIT: 39.1 % (ref 36.0–46.0)
HEMOGLOBIN: 10.6 g/dL — AB (ref 12.0–15.0)
MCH: 23.5 pg — AB (ref 26.0–34.0)
MCHC: 27.1 g/dL — ABNORMAL LOW (ref 30.0–36.0)
MCV: 86.7 fL (ref 78.0–100.0)
Platelets: 145 10*3/uL — ABNORMAL LOW (ref 150–400)
RBC: 4.51 MIL/uL (ref 3.87–5.11)
RDW: 16.2 % — ABNORMAL HIGH (ref 11.5–15.5)
WBC: 9 10*3/uL (ref 4.0–10.5)

## 2015-05-06 LAB — I-STAT CHEM 8, ED
BUN: 26 mg/dL — ABNORMAL HIGH (ref 6–20)
Calcium, Ion: 1.05 mmol/L — ABNORMAL LOW (ref 1.13–1.30)
Chloride: 95 mmol/L — ABNORMAL LOW (ref 101–111)
Creatinine, Ser: 1.5 mg/dL — ABNORMAL HIGH (ref 0.44–1.00)
GLUCOSE: 184 mg/dL — AB (ref 65–99)
HCT: 39 % (ref 36.0–46.0)
HEMOGLOBIN: 13.3 g/dL (ref 12.0–15.0)
POTASSIUM: 3.2 mmol/L — AB (ref 3.5–5.1)
Sodium: 141 mmol/L (ref 135–145)
TCO2: 32 mmol/L (ref 0–100)

## 2015-05-06 LAB — I-STAT ARTERIAL BLOOD GAS, ED
Acid-Base Excess: 4 mmol/L — ABNORMAL HIGH (ref 0.0–2.0)
Bicarbonate: 29.8 mEq/L — ABNORMAL HIGH (ref 20.0–24.0)
O2 SAT: 71 %
PH ART: 7.379 (ref 7.350–7.450)
TCO2: 31 mmol/L (ref 0–100)
pCO2 arterial: 50.5 mmHg — ABNORMAL HIGH (ref 35.0–45.0)
pO2, Arterial: 39 mmHg — CL (ref 80.0–100.0)

## 2015-05-06 LAB — COMPREHENSIVE METABOLIC PANEL
ALT: 19 U/L (ref 14–54)
ANION GAP: 17 — AB (ref 5–15)
AST: 70 U/L — ABNORMAL HIGH (ref 15–41)
Albumin: 3.6 g/dL (ref 3.5–5.0)
Alkaline Phosphatase: 93 U/L (ref 38–126)
BUN: 19 mg/dL (ref 6–20)
CALCIUM: 9.1 mg/dL (ref 8.9–10.3)
CHLORIDE: 98 mmol/L — AB (ref 101–111)
CO2: 28 mmol/L (ref 22–32)
Creatinine, Ser: 1.79 mg/dL — ABNORMAL HIGH (ref 0.44–1.00)
GFR calc non Af Amer: 25 mL/min — ABNORMAL LOW (ref >60)
GFR, EST AFRICAN AMERICAN: 29 mL/min — AB (ref >60)
Glucose, Bld: 191 mg/dL — ABNORMAL HIGH (ref 65–99)
POTASSIUM: 3.4 mmol/L — AB (ref 3.5–5.1)
SODIUM: 143 mmol/L (ref 135–145)
Total Bilirubin: 2.1 mg/dL — ABNORMAL HIGH (ref 0.3–1.2)
Total Protein: 6.2 g/dL — ABNORMAL LOW (ref 6.5–8.1)

## 2015-05-06 LAB — I-STAT TROPONIN, ED: Troponin i, poc: 0.08 ng/mL (ref 0.00–0.08)

## 2015-05-06 LAB — I-STAT CG4 LACTIC ACID, ED: LACTIC ACID, VENOUS: 6.68 mmol/L — AB (ref 0.5–2.0)

## 2015-05-06 LAB — BRAIN NATRIURETIC PEPTIDE: B NATRIURETIC PEPTIDE 5: 1843.2 pg/mL — AB (ref 0.0–100.0)

## 2015-05-06 MED ORDER — APIXABAN 2.5 MG PO TABS
2.5000 mg | ORAL_TABLET | Freq: Two times a day (BID) | ORAL | Status: DC
  Administered 2015-05-06 – 2015-05-15 (×19): 2.5 mg via ORAL
  Filled 2015-05-06 (×20): qty 1

## 2015-05-06 MED ORDER — ALBUTEROL SULFATE (2.5 MG/3ML) 0.083% IN NEBU
2.5000 mg | INHALATION_SOLUTION | RESPIRATORY_TRACT | Status: AC | PRN
  Administered 2015-05-07: 2.5 mg via RESPIRATORY_TRACT
  Filled 2015-05-06 (×2): qty 3

## 2015-05-06 MED ORDER — ALBUTEROL SULFATE (2.5 MG/3ML) 0.083% IN NEBU
5.0000 mg | INHALATION_SOLUTION | Freq: Once | RESPIRATORY_TRACT | Status: AC
  Administered 2015-05-06: 5 mg via RESPIRATORY_TRACT
  Filled 2015-05-06: qty 6

## 2015-05-06 MED ORDER — IPRATROPIUM BROMIDE 0.02 % IN SOLN
0.5000 mg | Freq: Once | RESPIRATORY_TRACT | Status: AC
  Administered 2015-05-06: 0.5 mg via RESPIRATORY_TRACT
  Filled 2015-05-06: qty 2.5

## 2015-05-06 MED ORDER — PANTOPRAZOLE SODIUM 40 MG PO TBEC
40.0000 mg | DELAYED_RELEASE_TABLET | Freq: Every day | ORAL | Status: DC
  Administered 2015-05-07 – 2015-05-10 (×4): 40 mg via ORAL
  Filled 2015-05-06 (×4): qty 1

## 2015-05-06 MED ORDER — INSULIN ASPART 100 UNIT/ML ~~LOC~~ SOLN
0.0000 [IU] | Freq: Three times a day (TID) | SUBCUTANEOUS | Status: DC
  Administered 2015-05-07: 3 [IU] via SUBCUTANEOUS
  Administered 2015-05-07: 5 [IU] via SUBCUTANEOUS
  Administered 2015-05-07: 2 [IU] via SUBCUTANEOUS
  Administered 2015-05-08: 8 [IU] via SUBCUTANEOUS
  Administered 2015-05-08: 2 [IU] via SUBCUTANEOUS
  Administered 2015-05-08 – 2015-05-09 (×4): 3 [IU] via SUBCUTANEOUS
  Administered 2015-05-10 (×2): 5 [IU] via SUBCUTANEOUS
  Administered 2015-05-10: 3 [IU] via SUBCUTANEOUS
  Administered 2015-05-11: 5 [IU] via SUBCUTANEOUS
  Administered 2015-05-11: 3 [IU] via SUBCUTANEOUS
  Administered 2015-05-12: 5 [IU] via SUBCUTANEOUS
  Administered 2015-05-12: 3 [IU] via SUBCUTANEOUS
  Administered 2015-05-12: 5 [IU] via SUBCUTANEOUS
  Administered 2015-05-13: 2 [IU] via SUBCUTANEOUS
  Administered 2015-05-13: 8 [IU] via SUBCUTANEOUS
  Administered 2015-05-13: 3 [IU] via SUBCUTANEOUS
  Administered 2015-05-14: 2 [IU] via SUBCUTANEOUS
  Administered 2015-05-14: 11 [IU] via SUBCUTANEOUS
  Administered 2015-05-14: 8 [IU] via SUBCUTANEOUS
  Administered 2015-05-15: 3 [IU] via SUBCUTANEOUS
  Administered 2015-05-15: 11 [IU] via SUBCUTANEOUS
  Administered 2015-05-15: 5 [IU] via SUBCUTANEOUS

## 2015-05-06 MED ORDER — IPRATROPIUM-ALBUTEROL 0.5-2.5 (3) MG/3ML IN SOLN
3.0000 mL | Freq: Four times a day (QID) | RESPIRATORY_TRACT | Status: DC
  Administered 2015-05-07 – 2015-05-09 (×12): 3 mL via RESPIRATORY_TRACT
  Filled 2015-05-06 (×12): qty 3

## 2015-05-06 MED ORDER — ONDANSETRON HCL 4 MG/2ML IJ SOLN: 4.0000 mg | Freq: Three times a day (TID) | INTRAMUSCULAR | Status: AC | PRN

## 2015-05-06 MED ORDER — MONTELUKAST SODIUM 10 MG PO TABS
10.0000 mg | ORAL_TABLET | Freq: Every day | ORAL | Status: DC
  Administered 2015-05-06 – 2015-05-15 (×10): 10 mg via ORAL
  Filled 2015-05-06 (×10): qty 1

## 2015-05-06 MED ORDER — METHYLPREDNISOLONE SODIUM SUCC 125 MG IJ SOLR
125.0000 mg | Freq: Once | INTRAMUSCULAR | Status: AC
  Administered 2015-05-06: 125 mg via INTRAVENOUS
  Filled 2015-05-06: qty 2

## 2015-05-06 MED ORDER — ALBUTEROL (5 MG/ML) CONTINUOUS INHALATION SOLN
10.0000 mg/h | INHALATION_SOLUTION | Freq: Once | RESPIRATORY_TRACT | Status: AC
  Administered 2015-05-06: 10 mg/h via RESPIRATORY_TRACT
  Filled 2015-05-06: qty 20

## 2015-05-06 MED ORDER — METHYLPREDNISOLONE SODIUM SUCC 125 MG IJ SOLR
125.0000 mg | Freq: Four times a day (QID) | INTRAMUSCULAR | Status: DC
  Administered 2015-05-06 – 2015-05-07 (×2): 125 mg via INTRAVENOUS
  Filled 2015-05-06 (×2): qty 2

## 2015-05-06 MED ORDER — POTASSIUM CHLORIDE CRYS ER 20 MEQ PO TBCR
40.0000 meq | EXTENDED_RELEASE_TABLET | Freq: Two times a day (BID) | ORAL | Status: DC
  Administered 2015-05-06 – 2015-05-08 (×5): 40 meq via ORAL
  Filled 2015-05-06 (×5): qty 2

## 2015-05-06 MED ORDER — LEVETIRACETAM 500 MG PO TABS
500.0000 mg | ORAL_TABLET | Freq: Two times a day (BID) | ORAL | Status: DC
  Administered 2015-05-06 – 2015-05-15 (×19): 500 mg via ORAL
  Filled 2015-05-06 (×20): qty 1

## 2015-05-06 MED ORDER — TIOTROPIUM BROMIDE MONOHYDRATE 18 MCG IN CAPS
18.0000 ug | ORAL_CAPSULE | Freq: Every day | RESPIRATORY_TRACT | Status: DC
  Administered 2015-05-08 – 2015-05-12 (×4): 18 ug via RESPIRATORY_TRACT
  Filled 2015-05-06: qty 5

## 2015-05-06 MED ORDER — ASPIRIN EC 81 MG PO TBEC
81.0000 mg | DELAYED_RELEASE_TABLET | Freq: Every day | ORAL | Status: DC
  Administered 2015-05-06 – 2015-05-10 (×5): 81 mg via ORAL
  Filled 2015-05-06 (×10): qty 1

## 2015-05-06 MED ORDER — PAROXETINE HCL 20 MG PO TABS
10.0000 mg | ORAL_TABLET | Freq: Every day | ORAL | Status: DC
  Administered 2015-05-07 – 2015-05-15 (×9): 10 mg via ORAL
  Filled 2015-05-06 (×11): qty 1

## 2015-05-06 MED ORDER — ATORVASTATIN CALCIUM 40 MG PO TABS
40.0000 mg | ORAL_TABLET | ORAL | Status: DC
  Administered 2015-05-07 – 2015-05-14 (×4): 40 mg via ORAL
  Filled 2015-05-06 (×5): qty 1

## 2015-05-06 MED ORDER — FUROSEMIDE 10 MG/ML IJ SOLN
40.0000 mg | Freq: Once | INTRAMUSCULAR | Status: AC
  Administered 2015-05-06: 40 mg via INTRAVENOUS
  Filled 2015-05-06: qty 4

## 2015-05-06 NOTE — H&P (Signed)
History and Physical  [name] GNF:[id number] DOB: 1931/12/02 DOA: 05/11/2015  Referring physician: Linwood Dibbles, MD PCP: [name] Corwin, MD   Chief Complaint: "We went to the doctor and they said her oxygen level was low."  HPI: [name] is a 81 y.o. female with a past medical history significant for COPD on 5L home O2 and daily prednisone 10 mg, chronic combined systolic and diastolic heart failure, atrial fibrillation on anticoagulation, pacemaker, NIDDM, hypertension, chronic anemia and CKD stage III-IV who presents with hypoxia and elevated lactic acid.  The patient is accompanied by her daughter who provides most history as the patient is on Bipap.  They went to the patient's PCP this afternoon for a routine physical, waited for 45 minutes with her portable oxygen (she takes 5L at baseline and usually has home SpO2 ~85-88% per daughter).  When she got into the examination room, she was cold needing blankets, her fingers were blue and her SpO2 was in the 70s, so EMS was called.  They gave nebs and brought her to the ER, where her SpO2 increased to the mid to upper 80s.  The patient and her daughter denied new cough, worsening sputum. They note increased leg swelling over the last week and decreased energy over the course of days to weeks.  The patient has epigastric/RUQ pain, that has been present for some months, intermittently, exacerbated by sitting up.  Her baseline weight is 170 lbs per daughter.  Last d/c weight was 173 lbs in March.  In the ED, the patient was able to oxygenate in the low to mid-80s on aeromask.  She got additional bronchodilators and IV steroid and was prepared for admission.  Of note, the patient's sister died one week ago on Hospice for end-stage CHF.      Review of Systems:  Patient seen 2030 on 05/02/2015. Pt complains of fatigue, abdominal pain.  Pt denies any cough, sputum, fever.  Denies chest pain.  Otherwise twelve systems were reviewed  and were negative except as noted above in the history of present illness.  Past Medical History  Diagnosis Date  . AV block, complete   . CHF (congestive heart failure)   . AAA (abdominal aortic aneurysm)   . Dyslipidemia   . History of colonic polyps   . Anxiety and depression   . Diverticulosis of colon   . Asthma   . Acute renal failure   . Atrial flutter   . Hyperglycemia   . UTI (lower urinary tract infection)   . COPD (chronic obstructive pulmonary disease)   . HTN (hypertension)   . DM (diabetes mellitus)   . GERD (gastroesophageal reflux disease)   . DVT (deep venous thrombosis)   . B12 deficiency   . Angiodysplasia of intestine (without mention of hemorrhage)    Past Surgical History  Procedure Laterality Date  . Tubal ligation    . Abdominal hysterectomy    . Appendectomy    . Pacemaker insertion      medtronic   . Colonoscopy  02/06/2002    846.96,[phone]  . Bladder suspension     Social History:  reports that she quit smoking about 26 years ago. She started smoking about 61 years ago. She has never used smokeless tobacco. She reports that she does not drink alcohol or use illicit drugs. Patient lives at home with son in [place].  Daughter [name] is POA.  She is a non-smoker. She is independent with all ADLs and ambulates short distances  with a cane.  Allergies  Allergen Reactions  . Advair Diskus [Fluticasone-Salmeterol]     Itching  . Biaxin [Clarithromycin]     N/V  . Ciprofloxacin     N/V  . Doxycycline Other (See Comments)    Inside of mouth and tongue red and peeled  . Keflex [Cephalexin]     N/V  . Ketek [Telithromycin]     N/V  . Levaquin [Levofloxacin In D5w] Other (See Comments)    Arrythmia, polymorphic Vtach on 10/15 hospitalization  . Macrobid [Nitrofurantoin]     N/V  . Penicillins     Rash  . Sulfonamide Derivatives     unknown    Family History  Problem Relation Age of Onset  . Emphysema Maternal Uncle     multiple   . Asthma  Daughter   . Colon cancer Sister     alive at 68   . Heart disease Father   . CVA Mother   . Heart disease Mother   . Kidney failure Sister   . Heart disease Sister   Sister with CHF and ESRD.    Prior to Admission medications   Medication Sig Start Date End Date Taking? Authorizing Provider  apixaban (ELIQUIS) 5 MG TABS tablet Take 0.5 tablets (2.5 mg total) by mouth 2 (two) times daily. 11/12/14  Yes [name] Dallas, MD  aspirin 81 MG tablet Take 81 mg by mouth daily.   Yes Historical Provider, MD  Cholecalciferol (VITAMIN D) 2000 UNITS tablet Take 5,000 Units by mouth daily.    Yes Historical Provider, MD  dexlansoprazole (DEXILANT) 60 MG capsule Take 1 capsule (60 mg total) by mouth daily. 04/25/15  Yes [name] Cowboy, MD  furosemide (LASIX) 40 MG tablet Take 40 mg by mouth daily.  01/30/15  Yes Historical Provider, MD  ipratropium (ATROVENT) 0.06 % nasal spray Place 2 sprays into the nose 2 (two) times daily. 12/20/14  Yes [name] Cowboy, MD  levalbuterol [facility] HFA) 45 MCG/ACT inhaler Take 1 - 2 puffs -  5 minutes apart  - 4 x day or every 4 hours as needed to rescue Asthma Patient taking differently: Inhale 1 puff into the lungs See admin instructions. Take 1 - 2 puffs -  5 minutes apart  - 4 x day or every 4 hours as needed to rescue Asthma 12/05/14 12/06/15 Yes [name] Cowboy, MD  levalbuterol [facility]) 1.25 MG/3ML nebulizer solution 1 neb treatment 4 x day Patient taking differently: Take 1.25 mg by nebulization every 6 (six) hours as needed for shortness of breath. 1 neb treatment 4 x day 12/05/14 12/06/15 Yes [name] Cowboy, MD  levETIRAcetam (KEPPRA) 500 MG tablet Take 1 tablet (500 mg total) by mouth 2 (two) times daily. 12/05/14  Yes [name] Cowboy, MD  linagliptin (TRADJENTA) 5 MG TABS tablet Take 1 tablet (5 mg total) by mouth daily. 06/07/14  Yes [name] Dar, NP  metFORMIN (GLUCOPHAGE-XR) 500 MG 24 hr tablet Take 500 mg by mouth daily.   Yes Historical Provider, MD    montelukast (SINGULAIR) 10 MG tablet Take 1 tablet (10 mg total) by mouth at bedtime. 02/17/15  Yes [name] Cowboy, MD  PARoxetine (PAXIL) 20 MG tablet Take 10 mg by mouth daily.   Yes Historical Provider, MD  predniSONE (DELTASONE) 10 MG tablet Take 10 mg by mouth daily with breakfast.   Yes Historical Provider, MD  simvastatin (ZOCOR) 80 MG tablet Take 40 mg by mouth See admin instructions. Only take on Mon, Wed, and Fridays  per daughter   Yes Historical Provider, MD  tiotropium (SPIRIVA) 18 MCG inhalation capsule Place 18 mcg into inhaler and inhale daily.   Yes Historical Provider, MD  ONE TOUCH ULTRA TEST test strip USE TO CHECK BLOOD GLUCOSE ONCE A DAY AS INSTRUCTED 05/19/14   [name] Cowboy, MD  predniSONE (DELTASONE) 10 MG tablet TAKE 1 TABLET BY MOUTH ONCE A DAY TO THREE TIMES A DAY OR AS INSTRUCTED Patient not taking: Reported on 05/03/2015 01/06/15   [name] Cowboy, MD    Physical Exam: BP 135/59 mmHg  Pulse 51  Temp(Src) 98 F (36.7 C) (Axillary)  Resp 27  SpO2 100% General: Adult female, on Bipap.  Alert to questions.  No acute pain. Skin: Warm and dry.  Scattered senile purpura. HEENT: Corneas clear, conjunctivae and sclerae normal without injection or icterus, lids and lashes normal.  EOMI and PERRL.  Neck supple.  No cervical lymphadenopathy. Cardiac: Regular, nl S1-S2, no murmurs appreciated, heart sounds distant.  Capillary refill is less than 2 seconds.   Respiratory: No wheezes.  Breath sounds diminished globally.  No focal crackles. Abdomen: BS present.  Mild TTP in epigastrium and RUQ, Murphy's equivocal.  No ascites, distension. Extremities: No deformities/injuries.  Both legs with chronic venous stasis changes.  There is pitting edema bilaterally to the mid-shin, 2+.  5/5 grip strength and upper extremity flexion/extension, symmetrically.  Extremities are warm and well-perfused. Neuro: Sensorium intact.  Naming seems grossly intact, and patient is oriented to person,  place and situation. Muscle tone normal, without fasciculations.  Moves all extremities equally and with normal coordination.  Attention span and concentration are within normal limits.           Labs on Admission:  Basic Metabolic Panel:  Recent Labs Lab 05/20/2015 1954 05/11/2015 1957  NA 141 143  K 3.2* 3.4*  CL 95* 98*  CO2  --  28  GLUCOSE 184* 191*  BUN 26* 19  CREATININE 1.50* 1.79*  CALCIUM  --  9.1   Liver Function Tests:  Recent Labs Lab 05/03/2015 1957  AST 70*  ALT 19  ALKPHOS 93  BILITOT 2.1*  PROT 6.2*  ALBUMIN 3.6   No results for input(s): LIPASE, AMYLASE in the last 168 hours. No results for input(s): AMMONIA in the last 168 hours. CBC:  Recent Labs Lab 05/02/2015 1954 05/20/2015 1957  WBC  --  9.0  HGB 13.3 10.6*  HCT 39.0 39.1  MCV  --  86.7  PLT  --  145*   Cardiac Enzymes: No results for input(s): CKTOTAL, CKMB, CKMBINDEX, TROPONINI in the last 168 hours.  BNP (last 3 results)  Recent Labs  05/05/2015 1958  BNP 1843.2*    ProBNP (last 3 results)  Recent Labs  06/02/14  PROBNP 13998.0*    CBG: No results for input(s): GLUCAP in the last 168 hours.  Radiological Exams on Admission: Personally reviewed. No radiographic evidence of pulmonary edema. Dg Chest Port 1 View 05/14/2015 IMPRESSION: Stable cardiomegaly. Stable scarring in the lung bases and in the lingula. No acute cardiopulmonary disease.     EKG: Independently reviewed. Afib with pacemaker.  Assessment/Plan Present on Admission:  . Acute on chronic respiratory failure with hypoxia . COPD (chronic obstructive pulmonary disease) . Hypertension . T2_NIDDM w/CKD . Chronic renal failure, stage 3 (moderate) . COPD, severe . Chronic atrial fibrillation . Acute on chronic combined systolic and diastolic congestive heart failure . Acute and chronic respiratory failure with hypoxia . Hyperbilirubinemia . Elevated lactic acid  level    1. Acute on chronic respiratory  failure with hypoxia: The patient has chronic severe steroid-dependent COPD on 5L home O2 at baseline.  She presents with incidental hypoxia noted at her PCP's office, and without she or her daughter noticing increased wheezing, dyspnea, or sputum this week.  Nonetheless, cannot rule out COPD exacerbation, so will increase prednisone and offer bronchodilators.  Also, with elevated BNP (without previous baseline) and increased leg edema, suspect component of dyspnea is fluid overload from chronic CHF.  Her blood gas does not suggest acute hypercarbia (only chronic), which also suggests her hypoxia is from pulmonary edema. - Duo-nebs overnight - Prednisone 40 mg daily for 5 days - furosemide 40 mg IV once and then oral - trend BMP - Bipap as needed   2. Hyperbilirubinemia: The patient has new elevation in bilirubin.  This is possibly related to gallstones or acalculous cholectystitis, given that she does report a moderate RUQ pain (gallstones doubted, as the pain is not colicky). Other possibilities for elevation is congestive hepatopathy. - RUQ [place] - Trend LFT  3. Hyperlactatemia: Suspect this is related to poor perfusion from acute CHF and possibly contributing congestive hepatopathy.  There are no signs of systemic inflammation/sepsis. - Hold metformin - Repeat lactic acid  4. Hypertension:  At goal. Furosemide as above.  5. Type 2 diabetes: At goal last March. - Hold oral agents - Continue aspirin and statin - Sliding scale with meals  6. Chronic atrial fibrilation: - Continue apixaban  7. History of seizures: -Continue Keppra    Code Status: DNR  Family Communication: Daughter, [name], present at bedside.   Disposition Plan:  The appropriate admission status for this patient is observation.   The patient's presenting symptoms, physical exam findings, and initial radiographic and laboratory data in the context of their chronic comorbidities is felt to place them at high risk  for further clinical deterioration, but it is expected at this point in time that with nebulized bronchodilators and overnight diuresis that the patient will be stable for discharge before 2 midnights.    Alberteen Sam [facility] Pager [phone]

## 2015-05-06 NOTE — Progress Notes (Signed)
   Subjective:    Patient ID: [name], female    DOB: 03/04/1932, 81 y.o.   MRN: [id number]  HPI [name] is a very nice 81 yo WWF with multiple medical co-morbidities , most significantly of EndStage COPD who was scheduled for annual CPE today, but upon arrival at the office today she became more acutely dyspneic and somewhat mentally dulled. At her base line he is on nasal O2 at 5 LPM with O2 sats usu about 90 %.  She appeared some what cyanotic and pal and O2 Sat was 74% and EMS was summoned for transport to [facility] ER for further evaluation.    Meds/All / PMH/PSH - reviewed.   Review of Systems   Systems review compromised by patient's obtunded mental status    Objective:   Physical Exam  BP 110/64 mmHg  Pulse 100  Temp(Src) 97.5 F (36.4 C)  Resp 20   No Stridor. Pale with cyanotic lips and fingertips. No rash or icterus.  HEENT - Eac's patent. TM's Nl. EOM's full. PERRLA. NasoOroPharynx clear. Neck - supple. Nl Thyroid. Carotids 2+ & No bruits, nodes, JVD Chest -Kyphotic/barrel chested with very distant BS and bibasilar rhonchi. Cor - Nl HS. Sl irregular R/R w/o sig MGR. PP obscured by chronic venous stasis type edema. Abd - Soft [name]- Generalized decrease in Muscle power, tone & bulk. Sitting in a wheelchair. Neuro - No obvious Cr N abnormalities. Sensory, motor and Cerebellar functions appear Nl w/o focal abnormalities. Mentally dulled.    Assessment & Plan:   1. Acute respiratory insufficiency- DDX: AECB, CAP, Acute CHF or combination thereof  - transported by EMS to [facility] ER  2. Atrial Fibrillation, Chronic   3. Chronic obstructive pulmonary disease, End Stage  - Daughter [name] today relates that her mother still refuses to consider Hospice care at this time.

## 2015-05-06 NOTE — ED Notes (Signed)
PA at bedside discussing plan of care w/ family - pt w/ spontaneous eye opening - remains on the venti-mask at 55% FIO2. SPO2 remains low between 76-83% and PA aware. Will continue to monitor.

## 2015-05-06 NOTE — ED Notes (Signed)
MD at bedside. 

## 2015-05-06 NOTE — ED Notes (Signed)
Pt to ED via [facility] c/o shortness of breath, worse from baseline. Pt was at her PCP for a regular visit when staff noted cyanosis to lips and nail beds. Initial sats 74% on 5L [place] (baseline per PCP is 90% on 5L [place]) Rhonchi and rales noted per staff. EMS gave  albuterol and a duoneb enroute with sats increasing to 85%. Pt A&Ox4, no cyanosis noted on assessment. Wheezing and crackles noted bilaterally

## 2015-05-06 NOTE — ED Provider Notes (Signed)
CSN: [id number]     Arrival date & time 05/03/2015  1734 History   First MD Initiated Contact with Patient 05/15/2015 1729     Chief Complaint  Patient presents with  . Shortness of Breath     (Consider location/radiation/quality/duration/timing/severity/associated sxs/prior Treatment) The history is provided by the EMS personnel and the patient. The history is limited by the condition of the patient.     Patient is a 81 year old female with history of COPD, hypertension, CHF, CKD stage 3, he was brought to the emergency room from her PCP office for hypoxia and shortness of breath. Patient reportedly went to the regular doctor's visit, she is normally on 5 L oxygen at home, she is observed to be short of breath, with cyanosis of her lips and fingers, and when placed on monitor her pulse ox was 75%.  EMS gave her breathing treatment in route and reports an improvement of her diffuse wheeze with fine crackles auscultated at the bases. Upon arrival to the ER the patient is alert and she is answering some questions. She is complaining of swelling of her lower extremities with a leaking wound on her right calf that began three days after bumping it.  Pt states she has had fever.  Daughter lends further history 3 months of progressive exertional dyspnea.  Pt had LE edema worsen over the past 3 days, daughter increased lasix from 20 to 40 for two days, then yesterday she returned to her normal dose of 20mg .  Pt and her daughter have discussed code status after last several admissions, pt wishes to be DNR/DNI.     Past Medical History  Diagnosis Date  . AV block, complete   . CHF (congestive heart failure)   . AAA (abdominal aortic aneurysm)   . Dyslipidemia   . History of colonic polyps   . Anxiety and depression   . Diverticulosis of colon   . Asthma   . Acute renal failure   . Atrial flutter   . Hyperglycemia   . UTI (lower urinary tract infection)   . COPD (chronic obstructive pulmonary  disease)   . HTN (hypertension)   . DM (diabetes mellitus)   . GERD (gastroesophageal reflux disease)   . DVT (deep venous thrombosis)   . B12 deficiency   . Angiodysplasia of intestine (without mention of hemorrhage)    Past Surgical History  Procedure Laterality Date  . Tubal ligation    . Abdominal hysterectomy    . Appendectomy    . Pacemaker insertion      medtronic   . Colonoscopy  02/06/2002    102.72,536.64  . Bladder suspension     Family History  Problem Relation Age of Onset  . Emphysema Maternal Uncle     multiple   . Asthma Daughter   . Colon cancer Sister     alive at 12   . Heart disease Father   . CVA Mother   . Heart disease Mother   . Kidney failure Sister   . Heart disease Sister    Social History  Substance Use Topics  . Smoking status: Former Smoker    Start date: 11/30/1953    Quit date: 08/23/1988  . Smokeless tobacco: Never Used     Comment: quit in 1990, smoked 1 ppd x 30 years   . Alcohol Use: No   OB History    No data available     Review of Systems  Unable to perform ROS: Unstable vital signs  Constitutional: Positive for fever.  Respiratory: Positive for cough and shortness of breath.   Cardiovascular: Positive for palpitations and leg swelling. Negative for chest pain.    Allergies  Advair diskus; Biaxin; Ciprofloxacin; Doxycycline; Keflex; Ketek; Levaquin; Macrobid; Penicillins; and Sulfonamide derivatives  Home Medications   Prior to Admission medications   Medication Sig Start Date End Date Taking? Authorizing Provider  apixaban (ELIQUIS) 5 MG TABS tablet Take 0.5 tablets (2.5 mg total) by mouth 2 (two) times daily. 11/12/14  Yes Drema Dallas, MD  aspirin 81 MG tablet Take 81 mg by mouth daily.   Yes Historical Provider, MD  Cholecalciferol (VITAMIN D) 2000 UNITS tablet Take 5,000 Units by mouth daily.    Yes Historical Provider, MD  dexlansoprazole (DEXILANT) 60 MG capsule Take 1 capsule (60 mg total) by mouth daily.  04/25/15  Yes Lucky Cowboy, MD  furosemide (LASIX) 40 MG tablet Take 40 mg by mouth daily.  01/30/15  Yes Historical Provider, MD  ipratropium (ATROVENT) 0.06 % nasal spray Place 2 sprays into the nose 2 (two) times daily. 12/20/14  Yes Lucky Cowboy, MD  levalbuterol [facility] HFA) 45 MCG/ACT inhaler Take 1 - 2 puffs -  5 minutes apart  - 4 x day or every 4 hours as needed to rescue Asthma Patient taking differently: Inhale 1 puff into the lungs See admin instructions. Take 1 - 2 puffs -  5 minutes apart  - 4 x day or every 4 hours as needed to rescue Asthma 12/05/14 12/06/15 Yes Lucky Cowboy, MD  levalbuterol [facility]) 1.25 MG/3ML nebulizer solution 1 neb treatment 4 x day Patient taking differently: Take 1.25 mg by nebulization every 6 (six) hours as needed for shortness of breath. 1 neb treatment 4 x day 12/05/14 12/06/15 Yes Lucky Cowboy, MD  levETIRAcetam (KEPPRA) 500 MG tablet Take 1 tablet (500 mg total) by mouth 2 (two) times daily. 12/05/14  Yes Lucky Cowboy, MD  linagliptin (TRADJENTA) 5 MG TABS tablet Take 1 tablet (5 mg total) by mouth daily. 06/07/14  Yes Russella Dar, NP  metFORMIN (GLUCOPHAGE-XR) 500 MG 24 hr tablet Take 500 mg by mouth daily.   Yes Historical Provider, MD  montelukast (SINGULAIR) 10 MG tablet Take 1 tablet (10 mg total) by mouth at bedtime. 02/17/15  Yes Lucky Cowboy, MD  PARoxetine (PAXIL) 20 MG tablet Take 10 mg by mouth daily.   Yes Historical Provider, MD  predniSONE (DELTASONE) 10 MG tablet Take 10 mg by mouth daily with breakfast.   Yes Historical Provider, MD  simvastatin (ZOCOR) 80 MG tablet Take 40 mg by mouth See admin instructions. Only take on Mon, Wed, and Fridays per daughter   Yes Historical Provider, MD  tiotropium (SPIRIVA) 18 MCG inhalation capsule Place 18 mcg into inhaler and inhale daily.   Yes Historical Provider, MD  ONE TOUCH ULTRA TEST test strip USE TO CHECK BLOOD GLUCOSE ONCE A DAY AS INSTRUCTED 05/19/14   Lucky Cowboy, MD   BP  137/68 mmHg  Pulse 72  Temp(Src) 98.3 F (36.8 C) (Oral)  Resp 23  Ht 5\' 4"  (1.626 m)  Wt 173 lb 4.5 oz (78.6 kg)  BMI 29.73 kg/m2  SpO2 96% Physical Exam  Constitutional: She is oriented to person, place, and time. She appears well-developed and well-nourished. She appears distressed.  HENT:  Head: Normocephalic and atraumatic.  Nose: Nose normal.  Oral mucosa dry  Eyes: Conjunctivae and EOM are normal. Pupils are equal, round, and reactive to light.  Neck: Normal  range of motion. No JVD present. No tracheal deviation present.  Cardiovascular: Normal rate and regular rhythm.  Exam reveals distant heart sounds. Exam reveals no gallop.   No murmur heard. Symmetrical radial pulses, 2+, LE pulses not palpable, 3+ pitting edema  Pulmonary/Chest: She is in respiratory distress. She has wheezes. She has rales. She exhibits no tenderness.  Faint cyanosis of lips and bilateral nailbeds, legs and hands cool to the touch Increased work of breathing, speaking in short sentences, diffuse inspiratory and expiratory wheeze auscultated in the left lung fields, Rales bilaterally at the bases   Abdominal: Soft. Bowel sounds are normal. She exhibits no distension. There is no tenderness. There is no rebound and no guarding.  Musculoskeletal:  Bruise to right medial calf, with broken skin leaking serous fluid  Neurological: She is alert and oriented to person, place, and time.  Skin: She is not diaphoretic.  Psychiatric: She has a normal mood and affect. Her behavior is normal. Judgment and thought content normal.    ED Course  Procedures (including critical care time) Labs Review Labs Reviewed  COMPREHENSIVE METABOLIC PANEL - Abnormal; Notable for the following:    Potassium 3.4 (*)    Chloride 98 (*)    Glucose, Bld 191 (*)    Creatinine, Ser 1.79 (*)    Total Protein 6.2 (*)    AST 70 (*)    Total Bilirubin 2.1 (*)    GFR calc non Af Amer 25 (*)    GFR calc Af Amer 29 (*)    Anion gap  17 (*)    All other components within normal limits  CBC - Abnormal; Notable for the following:    Hemoglobin 10.6 (*)    MCH 23.5 (*)    MCHC 27.1 (*)    RDW 16.2 (*)    Platelets 145 (*)    All other components within normal limits  BRAIN NATRIURETIC PEPTIDE - Abnormal; Notable for the following:    B Natriuretic Peptide 1843.2 (*)    All other components within normal limits  COMPREHENSIVE METABOLIC PANEL - Abnormal; Notable for the following:    Chloride 95 (*)    Glucose, Bld 267 (*)    Creatinine, Ser 1.90 (*)    Total Protein 5.8 (*)    AST 59 (*)    Total Bilirubin 1.6 (*)    GFR calc non Af Amer 24 (*)    GFR calc Af Amer 27 (*)    All other components within normal limits  CBC - Abnormal; Notable for the following:    Hemoglobin 10.2 (*)    MCH 24.2 (*)    MCHC 28.0 (*)    RDW 16.3 (*)    Platelets 115 (*)    All other components within normal limits  LACTIC ACID, PLASMA - Abnormal; Notable for the following:    Lactic Acid, Venous 6.0 (*)    All other components within normal limits  LACTIC ACID, PLASMA - Abnormal; Notable for the following:    Lactic Acid, Venous 3.6 (*)    All other components within normal limits  GLUCOSE, CAPILLARY - Abnormal; Notable for the following:    Glucose-Capillary 193 (*)    All other components within normal limits  GLUCOSE, CAPILLARY - Abnormal; Notable for the following:    Glucose-Capillary 223 (*)    All other components within normal limits  GLUCOSE, CAPILLARY - Abnormal; Notable for the following:    Glucose-Capillary 143 (*)    All other  components within normal limits  GLUCOSE, CAPILLARY - Abnormal; Notable for the following:    Glucose-Capillary 183 (*)    All other components within normal limits  GLUCOSE, CAPILLARY - Abnormal; Notable for the following:    Glucose-Capillary 199 (*)    All other components within normal limits  GLUCOSE, CAPILLARY - Abnormal; Notable for the following:    Glucose-Capillary 199 (*)     All other components within normal limits  GLUCOSE, CAPILLARY - Abnormal; Notable for the following:    Glucose-Capillary 258 (*)    All other components within normal limits  BASIC METABOLIC PANEL - Abnormal; Notable for the following:    Potassium 6.2 (*)    Chloride 98 (*)    Glucose, Bld 191 (*)    BUN 43 (*)    Creatinine, Ser 2.08 (*)    GFR calc non Af Amer 21 (*)    GFR calc Af Amer 25 (*)    All other components within normal limits  GLUCOSE, CAPILLARY - Abnormal; Notable for the following:    Glucose-Capillary 146 (*)    All other components within normal limits  GLUCOSE, CAPILLARY - Abnormal; Notable for the following:    Glucose-Capillary 196 (*)    All other components within normal limits  POTASSIUM - Abnormal; Notable for the following:    Potassium 5.8 (*)    All other components within normal limits  GLUCOSE, CAPILLARY - Abnormal; Notable for the following:    Glucose-Capillary 163 (*)    All other components within normal limits  POTASSIUM - Abnormal; Notable for the following:    Potassium 6.0 (*)    All other components within normal limits  GLUCOSE, CAPILLARY - Abnormal; Notable for the following:    Glucose-Capillary 174 (*)    All other components within normal limits  GLUCOSE, CAPILLARY - Abnormal; Notable for the following:    Glucose-Capillary 160 (*)    All other components within normal limits  GLUCOSE, CAPILLARY - Abnormal; Notable for the following:    Glucose-Capillary 199 (*)    All other components within normal limits  I-STAT CG4 LACTIC ACID, ED - Abnormal; Notable for the following:    Lactic Acid, Venous 6.68 (*)    All other components within normal limits  I-STAT CHEM 8, ED - Abnormal; Notable for the following:    Potassium 3.2 (*)    Chloride 95 (*)    BUN 26 (*)    Creatinine, Ser 1.50 (*)    Glucose, Bld 184 (*)    Calcium, Ion 1.05 (*)    All other components within normal limits  I-STAT ARTERIAL BLOOD GAS, ED - Abnormal;  Notable for the following:    pCO2 arterial 50.5 (*)    pO2, Arterial 39.0 (*)    Bicarbonate 29.8 (*)    Acid-Base Excess 4.0 (*)    All other components within normal limits  MRSA PCR SCREENING  LACTIC ACID, PLASMA  BASIC METABOLIC PANEL  Rosezena Sensor, ED    Imaging Review Dg Chest Port 1 View  05/08/2015   CLINICAL DATA:  Cough.  EXAM: PORTABLE CHEST - 1 VIEW  COMPARISON:  05/11/15 .  FINDINGS: Mediastinum hilar structures are normal. AICD in stable position. Stable cardiomegaly. Low lung volumes with bibasilar atelectasis and/or infiltrates. No pleural effusion pneumothorax. No acute bony abnormality .  IMPRESSION: 1. AICD in stable position. Stable cardiomegaly. No pulmonary venous congestion. 2. Low lung volumes with mild bibasilar atelectasis and/or infiltrates.   Electronically  Signed   By: Maisie Fus  Register   On: 05/08/2015 07:06   I have personally reviewed and evaluated these images and lab results as part of my medical decision-making.   EKG Interpretation   Date/Time:  Tuesday May 06 2015 17:31:27 EDT Ventricular Rate:  74 PR Interval:    QRS Duration: 134 QT Interval:  530 QTC Calculation: 588 R Axis:   -122 Text Interpretation:  Afib/flutter and ventricular-paced rhythm No further  analysis attempted due to paced rhythm No significant change since last  tracing Confirmed by KNAPP  MD-J, JON ([id number]) on 05/01/2015 5:37:55 PM      MDM   Final diagnoses:  RUQ abdominal pain  SOB (shortness of breath)    Pt with acute on chronic respiratory failure - hypoxic upon presentation, but alert and oriented x 3.  Lung sounds improved after breathing tx and pt was placed on VM mask with initial response - spO2 in the low 90's, however she continues desat.   She is extremely dyspneic with minimal exertion, suspect CHF exacerbation with complication with COPD.  There was a delay getting lab work, currently results are pertinent for elevated BUN/creatinine,  hypokalemia, hypochloremia, elevated lactic acid 6.68, negative troponin Suspect elevated lactic acid is due to who hypoxia, it does not appear the patient has infection at this time, x-ray was negative for pneumonia, her white count was normal  ABG ordered to further assess, Dr. Lynelle Doctor has ordered BiPAP  Pt will be admitted to stepdown by [facility] for acute on chronic respiratory failure with hypoxia   Danelle Berry, PA-C 05/10/15 [phone]

## 2015-05-06 NOTE — Progress Notes (Signed)
Patient transported to [facility] from ED on BiPap.  No complications, tolerated well.

## 2015-05-06 NOTE — ED Notes (Signed)
RT at bedside for peak flow assessment and ABG collection.

## 2015-05-06 NOTE — ED Notes (Signed)
Respiratory at bedside; per daughter at bedside, pt has been placed on bipap in the past

## 2015-05-06 NOTE — ED Provider Notes (Signed)
Pt states she went to her doctors office for a routine visit and was noted to be cyanotic and hypoxic.  Pt states she was not feeling any worse than usual.  At the office her o2 sat was 74% on 5 l.  EMS gave her a duoneb and sx have improved. Physical Exam  BP 122/71 mmHg  Pulse 77  Temp(Src) 98 F (36.7 C) (Axillary)  Resp 22  SpO2 83%  Physical Exam  Constitutional: No distress.  HENT:  Head: Normocephalic and atraumatic.  Right Ear: External ear normal.  Left Ear: External ear normal.  Eyes: Conjunctivae are normal. Right eye exhibits no discharge. Left eye exhibits no discharge. No scleral icterus.  Neck: Neck supple. No tracheal deviation present.  Cardiovascular: Normal rate.   Pulmonary/Chest: Effort normal. No stridor. No respiratory distress. She has wheezes. She has rales.  Musculoskeletal: She exhibits edema (bilateral lower extrem below the knees).  Neurological: She is alert. Cranial nerve deficit: no gross deficits.  Skin: Skin is warm and dry. No rash noted. She is not diaphoretic.  Psychiatric: She has a normal mood and affect.  Nursing note and vitals reviewed.   ED Course  Procedures  EKG Interpretation  Date/Time:  Tuesday 05/16/15 17:31:27 EDT Ventricular Rate:  74 PR Interval:    QRS Duration: 134 QT Interval:  530 QTC Calculation: 588 R Axis:   -122 Text Interpretation:  Afib/flutter and ventricular-paced rhythm No further analysis attempted due to paced rhythm No significant change since last tracing Confirmed by Brunetta Newingham  MD-J, Alphus Zeck ([id number]) on May 16, 2015 5:37:55 PM       MDM Sx suggestive of a copd exacerbation.  Could have a component of CHF or pna.  Will give breathing treatments and steroids.  Will check labs and xrays.  Anticipate admission.  Medical screening examination/treatment/procedure(s) were conducted as a shared visit with non-physician practitioner(s) and myself.  I personally evaluated the patient during the  encounter.      Linwood Dibbles, MD May 16, 2015 [phone]

## 2015-05-07 DIAGNOSIS — E1121 Type 2 diabetes mellitus with diabetic nephropathy: Secondary | ICD-10-CM

## 2015-05-07 DIAGNOSIS — I5033 Acute on chronic diastolic (congestive) heart failure: Secondary | ICD-10-CM

## 2015-05-07 DIAGNOSIS — E872 Acidosis: Secondary | ICD-10-CM

## 2015-05-07 DIAGNOSIS — I714 Abdominal aortic aneurysm, without rupture: Secondary | ICD-10-CM

## 2015-05-07 DIAGNOSIS — I10 Essential (primary) hypertension: Secondary | ICD-10-CM

## 2015-05-07 DIAGNOSIS — N183 Chronic kidney disease, stage 3 (moderate): Secondary | ICD-10-CM

## 2015-05-07 LAB — COMPREHENSIVE METABOLIC PANEL
ALBUMIN: 3.5 g/dL (ref 3.5–5.0)
ALT: 20 U/L (ref 14–54)
AST: 59 U/L — AB (ref 15–41)
Alkaline Phosphatase: 88 U/L (ref 38–126)
Anion gap: 15 (ref 5–15)
BILIRUBIN TOTAL: 1.6 mg/dL — AB (ref 0.3–1.2)
BUN: 19 mg/dL (ref 6–20)
CHLORIDE: 95 mmol/L — AB (ref 101–111)
CO2: 32 mmol/L (ref 22–32)
Calcium: 9 mg/dL (ref 8.9–10.3)
Creatinine, Ser: 1.9 mg/dL — ABNORMAL HIGH (ref 0.44–1.00)
GFR calc Af Amer: 27 mL/min — ABNORMAL LOW (ref >60)
GFR calc non Af Amer: 24 mL/min — ABNORMAL LOW (ref >60)
GLUCOSE: 267 mg/dL — AB (ref 65–99)
POTASSIUM: 3.7 mmol/L (ref 3.5–5.1)
SODIUM: 142 mmol/L (ref 135–145)
Total Protein: 5.8 g/dL — ABNORMAL LOW (ref 6.5–8.1)

## 2015-05-07 LAB — CBC
HEMATOCRIT: 36.4 % (ref 36.0–46.0)
HEMOGLOBIN: 10.2 g/dL — AB (ref 12.0–15.0)
MCH: 24.2 pg — AB (ref 26.0–34.0)
MCHC: 28 g/dL — AB (ref 30.0–36.0)
MCV: 86.5 fL (ref 78.0–100.0)
Platelets: 115 10*3/uL — ABNORMAL LOW (ref 150–400)
RBC: 4.21 MIL/uL (ref 3.87–5.11)
RDW: 16.3 % — AB (ref 11.5–15.5)
WBC: 5.7 10*3/uL (ref 4.0–10.5)

## 2015-05-07 LAB — GLUCOSE, CAPILLARY
GLUCOSE-CAPILLARY: 223 mg/dL — AB (ref 65–99)
GLUCOSE-CAPILLARY: 143 mg/dL — AB (ref 65–99)
GLUCOSE-CAPILLARY: 183 mg/dL — AB (ref 65–99)
Glucose-Capillary: 193 mg/dL — ABNORMAL HIGH (ref 65–99)
Glucose-Capillary: 199 mg/dL — ABNORMAL HIGH (ref 65–99)

## 2015-05-07 LAB — LACTIC ACID, PLASMA
Lactic Acid, Venous: 6 mmol/L (ref 0.5–2.0)
Lactic Acid, Venous: 3.6 mmol/L (ref 0.5–2.0)

## 2015-05-07 LAB — MRSA PCR SCREENING: MRSA by PCR: NEGATIVE

## 2015-05-07 MED ORDER — FUROSEMIDE 40 MG PO TABS: 40.0000 mg | ORAL_TABLET | Freq: Every day | ORAL | Status: DC

## 2015-05-07 MED ORDER — METHYLPREDNISOLONE SODIUM SUCC 125 MG IJ SOLR
60.0000 mg | Freq: Four times a day (QID) | INTRAMUSCULAR | Status: DC
  Administered 2015-05-07 – 2015-05-08 (×4): 60 mg via INTRAVENOUS
  Filled 2015-05-07 (×4): qty 2

## 2015-05-07 MED ORDER — FUROSEMIDE 10 MG/ML IJ SOLN
40.0000 mg | Freq: Every day | INTRAMUSCULAR | Status: DC
  Administered 2015-05-07 – 2015-05-08 (×2): 40 mg via INTRAVENOUS
  Filled 2015-05-07 (×2): qty 4

## 2015-05-07 NOTE — Progress Notes (Signed)
[facility] PROGRESS NOTE  SULEYMA WAFER ZOX:[id number] DOB: 1932-07-02 DOA: 05/19/2015 PCP: Nadean Corwin, MD  Assessment/Plan: 1- Acute on Chronic resp failure with hypoxia: patient chronically on 5L at home; due to COPD. Presented with worsening SOB, hypoxia and increased LE swelling. Patient worsening breathing due to acute on chronic diastolic HF and mild COPD exacerbation. -will continue tx with BIPAP (and wean as tolerated) -will continue IV lasix -continue solumedrol and nebs/inhaler treatment -follow CXR in am -will follow clinical response  2-chronic atrial fibrillation: CHADsVASC score 6 -will continue apixaban -rate controlled  3-HLD: in patient with hx of PVD and AAA -continue statins  4-Hx of AAA: stable and follow as an outpatient -continue abd [place] checks periodically  5-GERD: continue PPI  6-type 2 diabetes: with PVD, renal manifestation and hyperglycemia -will use SSI -expecting hyperglycemia due to use of steroids  7-essential HTN: stable and well controlled -will continue current antihypertensive regimen -follow VS   8-hx of seizures: continue keppra  9-elevated lactic acid: trending down -will continue holding metformin -most likely associated with decrease perfusion with ongoing CHF exacerbation -will follow trend -no signs of infection   10-CKD stage 3: essentially at baseline -will monitor closely, especially with IV diuresis   Code Status: DNR Family Communication: no family at bedside  Disposition Plan: remains in stepdown due to need for BIPAP   Consultants:  None   PCCM (curbside; due to low sat on BIPAP; they expressed patient with chronic resp failure due to COPD and 5L O2 supplementation. Aim treatment to get her off BIPAP to an O2 sat of 88% and continue tx for CHF)  Procedures:  See below for x-ray reports   Antibiotics:  None   HPI/Subjective: Still SOB and with low sat despite BIPAP; denies CP and is  afebrile. Positive signs for fluid overload.  Objective: Filed Vitals:   05/07/15 0818  BP: 139/58  Pulse: 71  Temp:   Resp: 17    Intake/Output Summary (Last 24 hours) at 05/07/15 0936 Last data filed at 05/07/15 0315  Gross per 24 hour  Intake      0 ml  Output    250 ml  Net   -250 ml   Filed Weights   04/30/2015 2157 05/07/15 0500  Weight: 79.8 kg (175 lb 14.8 oz) 81 kg (178 lb 9.2 oz)    Exam:   General:  AAOX3, no CP, still with significant SOB and low 90's O2 sat despite BIPAP. Patient is afebrile and with signs of fluid overload (LE edema, positive JVD and bibasilar crackles)  Cardiovascular: positive JVD, rate controlled, no rubs or gallops  Respiratory: no wheezing; poor air movement, positive crackles. No use of accessory muscles, tolerating BIPAP  Abdomen: soft, NT, ND, positive BS  Musculoskeletal: no cyanosis, no erythema; 2++ swelling appreciated bilaterally  Data Reviewed: Basic Metabolic Panel:  Recent Labs Lab 04/25/2015 1954 05/03/2015 1957 05/07/15 0112  NA 141 143 142  K 3.2* 3.4* 3.7  CL 95* 98* 95*  CO2  --  28 32  GLUCOSE 184* 191* 267*  BUN 26* 19 19  CREATININE 1.50* 1.79* 1.90*  CALCIUM  --  9.1 9.0   Liver Function Tests:  Recent Labs Lab 05/19/2015 1957 05/07/15 0112  AST 70* 59*  ALT 19 20  ALKPHOS 93 88  BILITOT 2.1* 1.6*  PROT 6.2* 5.8*  ALBUMIN 3.6 3.5   CBC:  Recent Labs Lab 05/22/2015 1954 05/03/2015 1957 05/07/15 0112  WBC  --  9.0  5.7  HGB 13.3 10.6* 10.2*  HCT 39.0 39.1 36.4  MCV  --  86.7 86.5  PLT  --  145* 115*   BNP (last 3 results)  Recent Labs  12-May-2015 1958  BNP 1843.2*    ProBNP (last 3 results)  Recent Labs  06/02/14  PROBNP 13998.0*    CBG:  Recent Labs Lab 05-12-2015 2307 05/07/15 0749  GLUCAP 193* 223*    Recent Results (from the past 240 hour(s))  MRSA PCR Screening     Status: None   Collection Time: May 12, 2015  9:47 PM  Result Value Ref Range Status   MRSA by PCR NEGATIVE  NEGATIVE Final    Comment:        The GeneXpert MRSA Assay (FDA approved for NASAL specimens only), is one component of a comprehensive MRSA colonization surveillance program. It is not intended to diagnose MRSA infection nor to guide or monitor treatment for MRSA infections.      Studies: US Abdomen Port  05/07/2015   CLINICAL DATA:  81 year old female with right upper quadrant abdominal pain  EXAM: ULTRASOUND PORTABLE ABDOMEN  COMPARISON:  Radiograph dated 08/12/2011 and ultrasound dated 06/05/2008  FINDINGS: Gallbladder: The gallbladder is distended. No echogenic stone identified. There is no gallbladder wall thickening or pericholecystic fluid. Negative sonographic Murphy sign.  Common bile duct: Diameter: 3-5 mm within normal limits.  Liver: No focal lesion identified. Within normal limits in parenchymal echogenicity.  IVC: No abnormality visualized.  Pancreas: Visualized portion unremarkable.  Spleen: Size and appearance within normal limits.  Right Kidney: Length: 10 cm.  No hydronephrosis or echogenic stone.  Left Kidney: Length: 9 cm. No hydronephrosis or echogenic stone. The 1.2 x 1.3 x 1.6 cm right renal upper pole hypoechoic lesion has increased since prior ultrasound of 2009 when it measured 6 x 9 x 8 mm.  There are moderate bilateral renal cortical atrophy.  Abdominal aorta: There is a mid abdominal aortic aneurysm measuring 4 cm in AP diameter.  Other findings: None.  IMPRESSION: Mildly dilated gallbladder.  No echogenic stone.  Mild bilateral renal cortical atrophy. No hydronephrosis or echogenic stone.  Indeterminate left renal upper pole hypoechoic lesion with slight interval increase since 2009.  A 4 cm abdominal aortic aneurysm.   Electronically Signed   By: Elgie Collard M.D.   On: 05/07/2015 04:02   Dg Chest Port 1 View  2015-05-12   CLINICAL DATA:  Patient presented to her primary physician's office earlier today and was found to be cyanotic and hypoxic. This improved  after nebulizer treatment. Patient was sent to the emergency department for further evaluation. Current history of COPD, asthma and CHF.  EXAM: PORTABLE CHEST - 1 VIEW  COMPARISON:  11/11/2014 and earlier, including CT chest 08/16/2011.  FINDINGS: Cardiac silhouette moderately to markedly enlarged, unchanged. Left subclavian biventricular pacemaker unchanged and appears intact. Scarring in the lung bases and lingula, unchanged. Lungs otherwise clear. No localized airspace consolidation. No pleural effusions. No pneumothorax. Normal pulmonary vascularity.  IMPRESSION: Stable cardiomegaly. Stable scarring in the lung bases and in the lingula. No acute cardiopulmonary disease.   Electronically Signed   By: Hulan Saas M.D.   On: May 12, 2015 19:07    Scheduled Meds: . apixaban  2.5 mg Oral BID  . aspirin EC  81 mg Oral Daily  . atorvastatin  40 mg Oral Q M,W,F  . furosemide  40 mg Intravenous Daily  . insulin aspart  0-15 Units Subcutaneous TID WC  . ipratropium-albuterol  3  mL Nebulization Q6H  . levETIRAcetam  500 mg Oral BID  . methylPREDNISolone (SOLU-MEDROL) injection  60 mg Intravenous Q6H  . montelukast  10 mg Oral QHS  . pantoprazole  40 mg Oral Daily  . PARoxetine  10 mg Oral Daily  . potassium chloride  40 mEq Oral BID  . tiotropium  18 mcg Inhalation Daily   Continuous Infusions:   Principal Problem:   Acute on chronic respiratory failure with hypoxia Active Problems:   Hypertension   COPD (chronic obstructive pulmonary disease)   T2_NIDDM w/CKD   Chronic atrial fibrillation   COPD, severe   Chronic renal failure, stage 3 (moderate)   Acute on chronic combined systolic and diastolic congestive heart failure   Acute and chronic respiratory failure with hypoxia   Hyperbilirubinemia   Elevated lactic acid level    Time spent: 45 minutes (50% of the time dedicated to face to face evaluation, coordination of care and discussion with other specialist attendings about her  case)    Vassie Loll  [facility] Pager [phone]. If 7PM-7AM, please contact night-coverage at [email or web address], password [facility] 05/07/2015, 9:36 AM  LOS: 1 day

## 2015-05-07 NOTE — Care Management Note (Signed)
Case Management Note  Patient Details  Name: MERIDETH BOSQUE MRN: [id number] Date of Birth: December 16, 1931  Subjective/Objective:      Adm w resp failure, bipap              Action/Plan: lives w son, supp da, pcp dr Lucky Cowboy   Expected Discharge Date:                  Expected Discharge Plan:  Home w Home Health Services  In-House Referral:     Discharge planning Services     Post Acute Care Choice:    Choice offered to:     DME Arranged:    DME Agency:     HH Arranged:    HH Agency:     Status of Service:     Medicare Important Message Given:    Date Medicare IM Given:    Medicare IM give by:    Date Additional Medicare IM Given:    Additional Medicare Important Message give by:     If discussed at Long Length of Stay Meetings, dates discussed:    Additional Comments: ur review done  Hanley Hays, RN 05/07/2015, 8:41 AM

## 2015-05-07 NOTE — Progress Notes (Signed)
Pt was titrated from the BiPAP to 6L O2 cannula per MD Danford verbal order. Pt is a mouth breathing and pt was desatting and didn't tolerate well. Pt RR gradually increase and pt O2 sats dropped to 78% quickly. Pt has no reserve. Pt was then placed back on NIV and tolerating well. RN at bedside. MD Danford made aware of event and interventions RT made to make pt stable. Pt is stable at this time. RT will closely monitor patient throughout night.

## 2015-05-07 NOTE — Progress Notes (Signed)
CRITICAL VALUE ALERT  Critical value received:  Lactic Acid 3.6   Date of notification:  05/07/2015  Time of notification:  0615  Critical value read back:Yes.    Nurse who received alert:  Hulan Amato  MD notified (1st page):  [facility] Maryfrances Bunnell, MD  Time of first page:  0618  MD notified (2nd page):  Time of second page:  Responding MD:  Georgina Peer, MD  Time MD responded:  [phone]

## 2015-05-07 NOTE — Progress Notes (Signed)
RT Note: RT came to assess patient and she had been taken off of Bipap by her RN. She is currently tolerating a NRB mask well with no complications or issues. RT will continue to monitor.

## 2015-05-07 NOTE — Progress Notes (Signed)
CRITICAL VALUE ALERT  Critical value received:  Lactic Acid 6.0  Date of notification:  05/07/2015  Time of notification: 0230  Critical value read back:Yes.    Nurse who received alert:  Gates Rigg, RN  MD notified (1st page):  [facility]  Time of first page:  0235  MD notified (2nd page):  Time of second page:  Responding MD:    Time MD responded:  Spoke to MD in person.

## 2015-05-07 NOTE — Progress Notes (Signed)
CRITICAL LAB VALUE:  Lactic Acid = 3.6  Primary RN notified & Dr. Maryfrances Bunnell notified via text page.

## 2015-05-08 ENCOUNTER — Inpatient Hospital Stay (HOSPITAL_COMMUNITY): Payer: Medicare Other

## 2015-05-08 DIAGNOSIS — N179 Acute kidney failure, unspecified: Secondary | ICD-10-CM

## 2015-05-08 LAB — GLUCOSE, CAPILLARY
GLUCOSE-CAPILLARY: 199 mg/dL — AB (ref 65–99)
Glucose-Capillary: 258 mg/dL — ABNORMAL HIGH (ref 65–99)
Glucose-Capillary: 146 mg/dL — ABNORMAL HIGH (ref 65–99)

## 2015-05-08 MED ORDER — METHYLPREDNISOLONE SODIUM SUCC 125 MG IJ SOLR
60.0000 mg | Freq: Two times a day (BID) | INTRAMUSCULAR | Status: DC
  Administered 2015-05-08 – 2015-05-09 (×2): 60 mg via INTRAVENOUS
  Filled 2015-05-08 (×2): qty 2

## 2015-05-08 NOTE — Discharge Instructions (Signed)

## 2015-05-08 NOTE — Progress Notes (Signed)
[facility] PROGRESS NOTE  [name] MWN:[id number] DOB: [birth year] DOA: 05/29/15 PCP: Nadean Corwin, MD  Assessment/Plan: 1- Acute on Chronic resp failure with hypoxia: patient chronically on 5-6L at home; due to COPD. Presented with worsening SOB, hypoxia and increased LE swelling. Patient worsening breathing due to acute on chronic diastolic HF and mild COPD exacerbation. -will continue weaning oxygen supplementation as tolerated; patient steadily improving. She uses 5-6L at baseline. Goal is for O2 sat 88-89% -will continue IV lasix for another 24 hours and then switch to PO lasix 40mg  daily -continue solumedrol and nebs/inhaler treatment; already tapering steroids -CXR with pretty much resolved vascular congestion and demonstrating bibasilar atelectasis/infiltrates. Patient do not have signs of infection and no productive cough, given significant allergy hx will hold on empiric abx's and will start flutter valve -will follow clinical response  2-chronic atrial fibrillation: CHADsVASC score 6 -will continue apixaban -rate controlled  3-HLD: in patient with hx of PVD and AAA -continue statins  4-Hx of AAA: stable and follow as an outpatient -continue abd [place] checks periodically  5-GERD: continue PPI  6-type 2 diabetes: with PVD, renal manifestation and hyperglycemia -will use SSI and adjust as needed -expecting hyperglycemia due to use of steroids  7-essential HTN: stable and well controlled -will continue current antihypertensive regimen -follow VS   8-hx of seizures: continue keppra  9-elevated lactic acid: trending down -will continue holding metformin -most likely associated with decrease perfusion with ongoing CHF exacerbation and use of metformin  -will follow trend -no signs of infection   10-acute on CKD stage 3: due to diuresis; mild. -will monitor closely, especially with need of IV diuresis -plan is for PO diuresis to start on 9/16 if  volume breathing stable   Code Status: DNR Family Communication: no family at bedside  Disposition Plan: remains in stepdown while determine stability and no further needs of BIPAP   Consultants:  None   PCCM (curbside; due to low sat on BIPAP; they expressed patient with chronic resp failure due to COPD and 5L O2 supplementation. Aim treatment to get her off BIPAP to an O2 sat of 88% and continue tx for CHF)  Procedures:  See below for x-ray reports   Antibiotics:  None   HPI/Subjective: Improved in SOB, no CP. Patient denies productive cough and is off BIAPP for now.  Objective: Filed Vitals:   05/08/15 0755  BP: 127/78  Pulse: 79  Temp: 97.8 F (36.6 C)  Resp: 20   No intake or output data in the 24 hours ending 05/08/15 1055 Filed Weights   05/29/2015 2157 05/07/15 0500 05/08/15 0412  Weight: 79.8 kg (175 lb 14.8 oz) 81 kg (178 lb 9.2 oz) 78.2 kg (172 lb 6.4 oz)    Exam:   General:  AAOX3, no CP, breathing much better and easier. Off BIPAP and using partial non-rebreather currently. Patient is afebrile and denies productive cough. Still with signs of fluid overload (LE edema, mild positive JVD and fine bibasilar crackles), but improved.  Cardiovascular: positive JVD, rate controlled, no rubs or gallops  Respiratory: no wheezing; improved air movement, positive fine bibasilar crackles. No use of accessory muscles, tolerating partial non-rebreather mask  Abdomen: soft, NT, ND, positive BS  Musculoskeletal: no cyanosis, no erythema; 1-2++ swelling appreciated bilaterally  Data Reviewed: Basic Metabolic Panel:  Recent Labs Lab 29-May-2015 1954 2015/05/29 1957 05/07/15 0112  NA 141 143 142  K 3.2* 3.4* 3.7  CL 95* 98* 95*  CO2  --  28  32  GLUCOSE 184* 191* 267*  BUN 26* 19 19  CREATININE 1.50* 1.79* 1.90*  CALCIUM  --  9.1 9.0   Liver Function Tests:  Recent Labs Lab May 15, 2015 1957 05/07/15 0112  AST 70* 59*  ALT 19 20  ALKPHOS 93 88  BILITOT 2.1*  1.6*  PROT 6.2* 5.8*  ALBUMIN 3.6 3.5   CBC:  Recent Labs Lab 05/15/15 1954 2015/05/15 1957 05/07/15 0112  WBC  --  9.0 5.7  HGB 13.3 10.6* 10.2*  HCT 39.0 39.1 36.4  MCV  --  86.7 86.5  PLT  --  145* 115*   BNP (last 3 results)  Recent Labs  May 15, 2015 1958  BNP 1843.2*    ProBNP (last 3 results)  Recent Labs  06/02/14  PROBNP 13998.0*    CBG:  Recent Labs Lab 05/07/15 0749 05/07/15 1257 05/07/15 1658 05/07/15 2103 05/08/15 0830  GLUCAP 223* 143* 183* 199* 199*    Recent Results (from the past 240 hour(s))  MRSA PCR Screening     Status: None   Collection Time: 05/15/15  9:47 PM  Result Value Ref Range Status   MRSA by PCR NEGATIVE NEGATIVE Final    Comment:        The GeneXpert MRSA Assay (FDA approved for NASAL specimens only), is one component of a comprehensive MRSA colonization surveillance program. It is not intended to diagnose MRSA infection nor to guide or monitor treatment for MRSA infections.      Studies: US Abdomen Port  05/07/2015   CLINICAL DATA:  81 year old female with right upper quadrant abdominal pain  EXAM: ULTRASOUND PORTABLE ABDOMEN  COMPARISON:  Radiograph dated 08/12/2011 and ultrasound dated 06/05/2008  FINDINGS: Gallbladder: The gallbladder is distended. No echogenic stone identified. There is no gallbladder wall thickening or pericholecystic fluid. Negative sonographic Murphy sign.  Common bile duct: Diameter: 3-5 mm within normal limits.  Liver: No focal lesion identified. Within normal limits in parenchymal echogenicity.  IVC: No abnormality visualized.  Pancreas: Visualized portion unremarkable.  Spleen: Size and appearance within normal limits.  Right Kidney: Length: 10 cm.  No hydronephrosis or echogenic stone.  Left Kidney: Length: 9 cm. No hydronephrosis or echogenic stone. The 1.2 x 1.3 x 1.6 cm right renal upper pole hypoechoic lesion has increased since prior ultrasound of 2009 when it measured 6 x 9 x 8 mm.  There are  moderate bilateral renal cortical atrophy.  Abdominal aorta: There is a mid abdominal aortic aneurysm measuring 4 cm in AP diameter.  Other findings: None.  IMPRESSION: Mildly dilated gallbladder.  No echogenic stone.  Mild bilateral renal cortical atrophy. No hydronephrosis or echogenic stone.  Indeterminate left renal upper pole hypoechoic lesion with slight interval increase since 2009.  A 4 cm abdominal aortic aneurysm.   Electronically Signed   By: Elgie Collard M.D.   On: 05/07/2015 04:02   Dg Chest Port 1 View  05/08/2015   CLINICAL DATA:  Cough.  EXAM: PORTABLE CHEST - 1 VIEW  COMPARISON:  15-May-2015 .  FINDINGS: Mediastinum hilar structures are normal. AICD in stable position. Stable cardiomegaly. Low lung volumes with bibasilar atelectasis and/or infiltrates. No pleural effusion pneumothorax. No acute bony abnormality .  IMPRESSION: 1. AICD in stable position. Stable cardiomegaly. No pulmonary venous congestion. 2. Low lung volumes with mild bibasilar atelectasis and/or infiltrates.   Electronically Signed   By: Maisie Fus  Register   On: 05/08/2015 07:06   Dg Chest Port 1 View  05/15/2015   CLINICAL DATA:  Patient presented to her primary physician's office earlier today and was found to be cyanotic and hypoxic. This improved after nebulizer treatment. Patient was sent to the emergency department for further evaluation. Current history of COPD, asthma and CHF.  EXAM: PORTABLE CHEST - 1 VIEW  COMPARISON:  11/11/2014 and earlier, including CT chest 08/16/2011.  FINDINGS: Cardiac silhouette moderately to markedly enlarged, unchanged. Left subclavian biventricular pacemaker unchanged and appears intact. Scarring in the lung bases and lingula, unchanged. Lungs otherwise clear. No localized airspace consolidation. No pleural effusions. No pneumothorax. Normal pulmonary vascularity.  IMPRESSION: Stable cardiomegaly. Stable scarring in the lung bases and in the lingula. No acute cardiopulmonary disease.    Electronically Signed   By: Hulan Saas M.D.   On: 05/15/2015 19:07    Scheduled Meds: . apixaban  2.5 mg Oral BID  . aspirin EC  81 mg Oral Daily  . atorvastatin  40 mg Oral Q M,W,F  . furosemide  40 mg Intravenous Daily  . insulin aspart  0-15 Units Subcutaneous TID WC  . ipratropium-albuterol  3 mL Nebulization Q6H  . levETIRAcetam  500 mg Oral BID  . methylPREDNISolone (SOLU-MEDROL) injection  60 mg Intravenous Q6H  . montelukast  10 mg Oral QHS  . pantoprazole  40 mg Oral Daily  . PARoxetine  10 mg Oral Daily  . potassium chloride  40 mEq Oral BID  . tiotropium  18 mcg Inhalation Daily   Continuous Infusions:   Principal Problem:   Acute on chronic respiratory failure with hypoxia Active Problems:   Hypertension   COPD (chronic obstructive pulmonary disease)   T2_NIDDM w/CKD   Chronic atrial fibrillation   COPD, severe   Chronic renal failure, stage 3 (moderate)   Acute on chronic combined systolic and diastolic congestive heart failure   Acute and chronic respiratory failure with hypoxia   Hyperbilirubinemia   Elevated lactic acid level    Time spent: 45 minutes (50% of the time dedicated to face to face evaluation, coordination of care and discussion with other specialist attendings about her case)    Vassie Loll  [facility] Pager [phone]. If 7PM-7AM, please contact night-coverage at [email or web address], password [facility] 05/08/2015, 10:55 AM  LOS: 2 days

## 2015-05-09 LAB — BASIC METABOLIC PANEL
ANION GAP: 9 (ref 5–15)
BUN: 43 mg/dL — ABNORMAL HIGH (ref 6–20)
CALCIUM: 8.9 mg/dL (ref 8.9–10.3)
CO2: 32 mmol/L (ref 22–32)
CREATININE: 2.08 mg/dL — AB (ref 0.44–1.00)
Chloride: 98 mmol/L — ABNORMAL LOW (ref 101–111)
GFR calc non Af Amer: 21 mL/min — ABNORMAL LOW (ref >60)
GFR, EST AFRICAN AMERICAN: 25 mL/min — AB (ref >60)
Glucose, Bld: 191 mg/dL — ABNORMAL HIGH (ref 65–99)
Potassium: 6.2 mmol/L (ref 3.5–5.1)
SODIUM: 139 mmol/L (ref 135–145)

## 2015-05-09 LAB — POTASSIUM
POTASSIUM: 5.8 mmol/L — AB (ref 3.5–5.1)
Potassium: 6 mmol/L — ABNORMAL HIGH (ref 3.5–5.1)

## 2015-05-09 LAB — GLUCOSE, CAPILLARY
GLUCOSE-CAPILLARY: 196 mg/dL — AB (ref 65–99)
GLUCOSE-CAPILLARY: 160 mg/dL — AB (ref 65–99)
GLUCOSE-CAPILLARY: 199 mg/dL — AB (ref 65–99)
Glucose-Capillary: 163 mg/dL — ABNORMAL HIGH (ref 65–99)
Glucose-Capillary: 174 mg/dL — ABNORMAL HIGH (ref 65–99)

## 2015-05-09 LAB — LACTIC ACID, PLASMA: LACTIC ACID, VENOUS: 1.7 mmol/L (ref 0.5–2.0)

## 2015-05-09 MED ORDER — FUROSEMIDE 40 MG PO TABS
20.0000 mg | ORAL_TABLET | Freq: Every day | ORAL | Status: DC
  Administered 2015-05-09 – 2015-05-10 (×2): 20 mg via ORAL
  Filled 2015-05-09 (×3): qty 1

## 2015-05-09 MED ORDER — IPRATROPIUM-ALBUTEROL 0.5-2.5 (3) MG/3ML IN SOLN
3.0000 mL | Freq: Three times a day (TID) | RESPIRATORY_TRACT | Status: DC
  Administered 2015-05-10: 3 mL via RESPIRATORY_TRACT
  Filled 2015-05-09: qty 3

## 2015-05-09 MED ORDER — SODIUM CHLORIDE 0.9 % IV SOLN
1.0000 g | Freq: Once | INTRAVENOUS | Status: AC
  Administered 2015-05-09: 1 g via INTRAVENOUS
  Filled 2015-05-09: qty 10

## 2015-05-09 MED ORDER — METHYLPREDNISOLONE SODIUM SUCC 40 MG IJ SOLR
40.0000 mg | Freq: Three times a day (TID) | INTRAMUSCULAR | Status: DC
Start: 2015-05-09 — End: 2015-05-10
  Administered 2015-05-09 – 2015-05-10 (×3): 40 mg via INTRAVENOUS
  Filled 2015-05-09 (×3): qty 1

## 2015-05-09 MED ORDER — SODIUM POLYSTYRENE SULFONATE 15 GM/60ML PO SUSP
15.0000 g | Freq: Once | ORAL | Status: AC
  Administered 2015-05-09: 15 g via ORAL
  Filled 2015-05-09 (×2): qty 60

## 2015-05-09 NOTE — Progress Notes (Signed)
Pt was tried on 6 l n/c - wanted oxygen mask off . Unable to hold sats better than 87 to 88. Encouraged deep breathing. Advanced  to vent-imask after breakfast and than onto 100 & non rebreather . Using [facility] without difficulty changed from IV lasix to PO

## 2015-05-09 NOTE — Progress Notes (Signed)
[facility] PROGRESS NOTE  LAURIEL HELIN ZOX:[id number] DOB: 1932-04-18 DOA: 05/15/2015 PCP: Nadean Corwin, MD  Assessment/Plan: 1- Acute on Chronic resp failure with hypoxia: patient chronically on 5-6L at home; due to COPD. Presented with worsening SOB, hypoxia and increased LE swelling. Patient worsening breathing due to acute on chronic diastolic HF and mild COPD exacerbation. -will continue weaning oxygen supplementation as tolerated; patient steadily improving. She uses 5-6L at baseline. Goal is for O2 sat 88-89% - transition to po lasix - chest x ray results reviewed: Mentions no pulmonary venous congestion and low lung volumes - Change solumedrol to 40 mg IV q 8 hours  2-chronic atrial fibrillation: CHADsVASC score 6 - will continue apixaban - rate controlled - Stable  3-HLD: in patient with hx of PVD and AAA -continue statins - Stable  4-Hx of AAA: stable and follow as an outpatient -continue abd [place] checks periodically - After discharge patient should follow-up with primary care physician or vascular surgeon for continued monitoring  5-GERD: continue PPI, stable  6-type 2 diabetes: with PVD, renal manifestation and hyperglycemia -will use SSI and adjust as needed -expecting hyperglycemia due to use of steroids  7-essential HTN: stable and well controlled -will continue current antihypertensive regimen -follow VS   8-hx of seizures: continue keppra  9-elevated lactic acid: trending down -will continue holding metformin -most likely associated with decrease perfusion with ongoing CHF exacerbation and use of metformin  -will follow trend -no signs of infection   10-acute on CKD stage 3: due to diuresis; mild. -will monitor closely, especially with need of IV diuresis -plan is for PO diuresis to start on 9/16 if volume breathing stable  11. Hyperkalemia - patient is s/p kayexalate. Will repeat levels. Suspect hemolysis    Code Status:  DNR Family Communication: no family at bedside  Disposition Plan: remains in stepdown while determine stability and no further needs of BIPAP   Consultants:  None   PCCM (curbside; due to low sat on BIPAP; they expressed patient with chronic resp failure due to COPD and 5L O2 supplementation. Aim treatment to get her off BIPAP to an O2 sat of 88% and continue tx for CHF)  Procedures:  See below for x-ray reports   Antibiotics:  None   HPI/Subjective: Pt states that her breathing is improved.  Objective: Filed Vitals:   05/09/15 0806  BP:   Pulse:   Temp: 97.6 F (36.4 C)  Resp:     Intake/Output Summary (Last 24 hours) at 05/09/15 1031 Last data filed at 05/09/15 8119  Gross per 24 hour  Intake    590 ml  Output      0 ml  Net    590 ml   Filed Weights   05/07/15 0500 05/08/15 0412 05/09/15 0441  Weight: 81 kg (178 lb 9.2 oz) 78.2 kg (172 lb 6.4 oz) 78.6 kg (173 lb 4.5 oz)    Exam:   General:  Alert and awake, in NAD  Cardiovascular: positive JVD, rate controlled, no rubs or gallops  Respiratory: no wheezing; improved air movement, positive fine bibasilar crackles BL. No use of accessory muscles, tolerating partial non-rebreather mask  Abdomen: soft, NT, ND, positive BS  Musculoskeletal: no cyanosis, no erythema  Data Reviewed: Basic Metabolic Panel:  Recent Labs Lab 04/24/2015 1954 05/13/2015 1957 05/07/15 0112 05/09/15 0258 05/09/15 0430  NA 141 143 142 139  --   K 3.2* 3.4* 3.7 6.2* 5.8*  CL 95* 98* 95* 98*  --  CO2  --  28 32 32  --   GLUCOSE 184* 191* 267* 191*  --   BUN 26* 19 19 43*  --   CREATININE 1.50* 1.79* 1.90* 2.08*  --   CALCIUM  --  9.1 9.0 8.9  --    Liver Function Tests:  Recent Labs Lab 2015-05-26 1957 05/07/15 0112  AST 70* 59*  ALT 19 20  ALKPHOS 93 88  BILITOT 2.1* 1.6*  PROT 6.2* 5.8*  ALBUMIN 3.6 3.5   CBC:  Recent Labs Lab 26-May-2015 1954 2015-05-26 1957 05/07/15 0112  WBC  --  9.0 5.7  HGB 13.3 10.6*  10.2*  HCT 39.0 39.1 36.4  MCV  --  86.7 86.5  PLT  --  145* 115*   BNP (last 3 results)  Recent Labs  26-May-2015 1958  BNP 1843.2*    ProBNP (last 3 results)  Recent Labs  06/02/14  PROBNP 13998.0*    CBG:  Recent Labs Lab 05/08/15 0830 05/08/15 1208 05/08/15 1721 05/08/15 2151 05/09/15 0733  GLUCAP 199* 258* 146* 196* 163*    Recent Results (from the past 240 hour(s))  MRSA PCR Screening     Status: None   Collection Time: 05-26-2015  9:47 PM  Result Value Ref Range Status   MRSA by PCR NEGATIVE NEGATIVE Final    Comment:        The GeneXpert MRSA Assay (FDA approved for NASAL specimens only), is one component of a comprehensive MRSA colonization surveillance program. It is not intended to diagnose MRSA infection nor to guide or monitor treatment for MRSA infections.      Studies: Dg Chest Port 1 View  05/08/2015   CLINICAL DATA:  Cough.  EXAM: PORTABLE CHEST - 1 VIEW  COMPARISON:  May 26, 2015 .  FINDINGS: Mediastinum hilar structures are normal. AICD in stable position. Stable cardiomegaly. Low lung volumes with bibasilar atelectasis and/or infiltrates. No pleural effusion pneumothorax. No acute bony abnormality .  IMPRESSION: 1. AICD in stable position. Stable cardiomegaly. No pulmonary venous congestion. 2. Low lung volumes with mild bibasilar atelectasis and/or infiltrates.   Electronically Signed   By: Maisie Fus  Register   On: 05/08/2015 07:06    Scheduled Meds: . apixaban  2.5 mg Oral BID  . aspirin EC  81 mg Oral Daily  . atorvastatin  40 mg Oral Q M,W,F  . furosemide  20 mg Oral Daily  . insulin aspart  0-15 Units Subcutaneous TID WC  . ipratropium-albuterol  3 mL Nebulization Q6H  . levETIRAcetam  500 mg Oral BID  . methylPREDNISolone (SOLU-MEDROL) injection  60 mg Intravenous Q12H  . montelukast  10 mg Oral QHS  . pantoprazole  40 mg Oral Daily  . PARoxetine  10 mg Oral Daily  . tiotropium  18 mcg Inhalation Daily   Continuous Infusions:    Principal Problem:   Acute on chronic respiratory failure with hypoxia Active Problems:   Hypertension   COPD (chronic obstructive pulmonary disease)   T2_NIDDM w/CKD   Chronic atrial fibrillation   COPD, severe   Chronic renal failure, stage 3 (moderate)   Acute on chronic combined systolic and diastolic congestive heart failure   Acute and chronic respiratory failure with hypoxia   Hyperbilirubinemia   Elevated lactic acid level    Time spent: 35 minutes    [name]  [facility] Pager [phone]. If 7PM-7AM, please contact night-coverage at [email or web address], password [facility] 05/09/2015, 10:31 AM  LOS: 3 days

## 2015-05-09 NOTE — Care Management Important Message (Signed)
Important Message  Patient Details  Name: [name] MRN: [id number] Date of Birth: [birth year]   Medicare Important Message Given:  Yes-second notification given    Kyla Balzarine 05/09/2015, 11:04 AM

## 2015-05-10 LAB — BASIC METABOLIC PANEL
ANION GAP: 11 (ref 5–15)
BUN: 46 mg/dL — ABNORMAL HIGH (ref 6–20)
CHLORIDE: 98 mmol/L — AB (ref 101–111)
CO2: 31 mmol/L (ref 22–32)
CREATININE: 2.03 mg/dL — AB (ref 0.44–1.00)
Calcium: 9.1 mg/dL (ref 8.9–10.3)
GFR calc non Af Amer: 22 mL/min — ABNORMAL LOW (ref >60)
GFR, EST AFRICAN AMERICAN: 25 mL/min — AB (ref >60)
Glucose, Bld: 247 mg/dL — ABNORMAL HIGH (ref 65–99)
POTASSIUM: 5.3 mmol/L — AB (ref 3.5–5.1)
SODIUM: 140 mmol/L (ref 135–145)

## 2015-05-10 LAB — GLUCOSE, CAPILLARY
GLUCOSE-CAPILLARY: 222 mg/dL — AB (ref 65–99)
GLUCOSE-CAPILLARY: 237 mg/dL — AB (ref 65–99)
GLUCOSE-CAPILLARY: 184 mg/dL — AB (ref 65–99)
Glucose-Capillary: 144 mg/dL — ABNORMAL HIGH (ref 65–99)

## 2015-05-10 MED ORDER — ALBUTEROL SULFATE (2.5 MG/3ML) 0.083% IN NEBU: 2.5000 mg | INHALATION_SOLUTION | RESPIRATORY_TRACT | Status: DC | PRN

## 2015-05-10 MED ORDER — MOMETASONE FURO-FORMOTEROL FUM 100-5 MCG/ACT IN AERO
2.0000 | INHALATION_SPRAY | Freq: Two times a day (BID) | RESPIRATORY_TRACT | Status: DC
  Administered 2015-05-11: 2 via RESPIRATORY_TRACT
  Filled 2015-05-10 (×2): qty 8.8

## 2015-05-10 MED ORDER — METHYLPREDNISOLONE SODIUM SUCC 125 MG IJ SOLR: 60.0000 mg | Freq: Every day | INTRAMUSCULAR | Status: DC

## 2015-05-10 MED ORDER — SODIUM POLYSTYRENE SULFONATE 15 GM/60ML PO SUSP
15.0000 g | Freq: Once | ORAL | Status: AC
  Administered 2015-05-10: 15 g via ORAL
  Filled 2015-05-10 (×2): qty 60

## 2015-05-10 MED ORDER — ALBUTEROL SULFATE (2.5 MG/3ML) 0.083% IN NEBU
2.5000 mg | INHALATION_SOLUTION | Freq: Four times a day (QID) | RESPIRATORY_TRACT | Status: DC
  Administered 2015-05-10 – 2015-05-11 (×4): 2.5 mg via RESPIRATORY_TRACT
  Filled 2015-05-10 (×4): qty 3

## 2015-05-10 NOTE — Progress Notes (Signed)
05/10/2015 patient was placed on [place] 5 Liter at 1030, saturation eventually decrease 70's, 80's then 60's. Dr Dorene Sorrow was made aware. [facility] RN.

## 2015-05-10 NOTE — Progress Notes (Addendum)
[facility] PROGRESS NOTE  [name] ZOX:[id number] DOB: Jun 27, 1932 DOA: 2015-05-12 PCP: Nadean Corwin, MD  Assessment/Plan: 1- Acute on Chronic resp failure with hypoxia: patient chronically on 5-6L at home; due to COPD. Presented with worsening SOB, hypoxia and increased LE swelling. Patient worsening breathing due to acute on chronic diastolic HF and mild COPD exacerbation. -will continue weaning oxygen supplementation as tolerated; patient steadily improving. She uses 5-6L at baseline. Goal is for O2 sat 88-92%. On non rebreather currently. Discussed care plan with nursing and goal for her SO2. We will attempt to place her on her home supplemental oxygen regimen. - transition to po lasix - chest x ray results reviewed: Mentions no pulmonary venous congestion and low lung volumes - change solumedrol to 60 mg IV daily  2-chronic atrial fibrillation: CHADsVASC score 6 - will continue apixaban - rate controlled - Stable  3-HLD: in patient with hx of PVD and AAA -continue statins - Stable  4-Hx of AAA: stable and follow as an outpatient -continue abd [place] checks periodically - After discharge patient should follow-up with primary care physician or vascular surgeon for continued monitoring  5-GERD: continue PPI, stable  6-type 2 diabetes: with PVD, renal manifestation and hyperglycemia -will use SSI and adjust as needed -Hyperglycemia secondary to use of steroids  7-essential HTN: stable and well controlled -will continue current antihypertensive regimen -follow VS   8-hx of seizures: continue keppra  9-elevated lactic acid: trending down -will continue holding metformin -resolved  10-acute on CKD stage 3: due to diuresis; mild. -continue po diuresis  11. Hyperkalemia - Will administer kayexalate today. Will repeat levels. Suspect hemolysis    Code Status: DNR Family Communication: no family at bedside  Disposition Plan: remains in stepdown while  determine stability and no further needs of BIPAP   Consultants:  None   PCCM (curbside; due to low sat on BIPAP; they expressed patient with chronic resp failure due to COPD and 5L O2 supplementation. Aim treatment to get her off BIPAP to an O2 sat of 88% and continue tx for CHF)  Procedures:  See below for x-ray reports   Antibiotics:  None   HPI/Subjective: Pt inquiring about discharge but still on non rebreather. No new complaints reported today  Objective: Filed Vitals:   05/10/15 0847  BP: 92/50  Pulse: 73  Temp: 98.2 F (36.8 C)  Resp: 23    Intake/Output Summary (Last 24 hours) at 05/10/15 1107 Last data filed at 05/10/15 0816  Gross per 24 hour  Intake    640 ml  Output   1075 ml  Net   -435 ml   Filed Weights   05/08/15 0412 05/09/15 0441 05/10/15 0816  Weight: 78.2 kg (172 lb 6.4 oz) 78.6 kg (173 lb 4.5 oz) 78.563 kg (173 lb 3.2 oz)    Exam:   General:  Alert and awake, in NAD  Cardiovascular: positive JVD, rate controlled, no rubs or gallops  Respiratory: no wheezing; improved air movement, positive fine bibasilar crackles BL. No use of accessory muscles, tolerating partial non-rebreather mask  Abdomen: soft, NT, ND, positive BS  Musculoskeletal: no cyanosis, no erythema  Data Reviewed: Basic Metabolic Panel:  Recent Labs Lab 05/12/2015 1954 05-12-15 1957 05/07/15 0112 05/09/15 0258 05/09/15 0430 05/09/15 1246 05/10/15 0347  NA 141 143 142 139  --   --  140  K 3.2* 3.4* 3.7 6.2* 5.8* 6.0* 5.3*  CL 95* 98* 95* 98*  --   --  98*  CO2  --  28 32 32  --   --  31  GLUCOSE 184* 191* 267* 191*  --   --  247*  BUN 26* 19 19 43*  --   --  46*  CREATININE 1.50* 1.79* 1.90* 2.08*  --   --  2.03*  CALCIUM  --  9.1 9.0 8.9  --   --  9.1   Liver Function Tests:  Recent Labs Lab 04/24/2015 1957 05/07/15 0112  AST 70* 59*  ALT 19 20  ALKPHOS 93 88  BILITOT 2.1* 1.6*  PROT 6.2* 5.8*  ALBUMIN 3.6 3.5   CBC:  Recent Labs Lab  04/25/2015 1954 04/29/2015 1957 05/07/15 0112  WBC  --  9.0 5.7  HGB 13.3 10.6* 10.2*  HCT 39.0 39.1 36.4  MCV  --  86.7 86.5  PLT  --  145* 115*   BNP (last 3 results)  Recent Labs  05/12/2015 1958  BNP 1843.2*    ProBNP (last 3 results)  Recent Labs  06/02/14  PROBNP 13998.0*    CBG:  Recent Labs Lab 05/09/15 0733 05/09/15 1249 05/09/15 1709 05/09/15 2216 05/10/15 0825  GLUCAP 163* 174* 160* 199* 222*    Recent Results (from the past 240 hour(s))  MRSA PCR Screening     Status: None   Collection Time: 05/22/2015  9:47 PM  Result Value Ref Range Status   MRSA by PCR NEGATIVE NEGATIVE Final    Comment:        The GeneXpert MRSA Assay (FDA approved for NASAL specimens only), is one component of a comprehensive MRSA colonization surveillance program. It is not intended to diagnose MRSA infection nor to guide or monitor treatment for MRSA infections.      Studies: No results found.  Scheduled Meds: . apixaban  2.5 mg Oral BID  . aspirin EC  81 mg Oral Daily  . atorvastatin  40 mg Oral Q M,W,F  . furosemide  20 mg Oral Daily  . insulin aspart  0-15 Units Subcutaneous TID WC  . ipratropium-albuterol  3 mL Nebulization TID  . levETIRAcetam  500 mg Oral BID  . methylPREDNISolone (SOLU-MEDROL) injection  40 mg Intravenous Q8H  . montelukast  10 mg Oral QHS  . pantoprazole  40 mg Oral Daily  . PARoxetine  10 mg Oral Daily  . tiotropium  18 mcg Inhalation Daily   Continuous Infusions:   Principal Problem:   Acute on chronic respiratory failure with hypoxia Active Problems:   Hypertension   COPD (chronic obstructive pulmonary disease)   T2_NIDDM w/CKD   Chronic atrial fibrillation   COPD, severe   Chronic renal failure, stage 3 (moderate)   Acute on chronic combined systolic and diastolic congestive heart failure   Acute and chronic respiratory failure with hypoxia   Hyperbilirubinemia   Elevated lactic acid level    Time spent: 35  minutes    Penny Pia  [facility] Pager [phone]. If 7PM-7AM, please contact night-coverage at [email or web address], password [name] 05/10/2015, 11:07 AM  LOS: 4 days      Unable to place back on 5 L of supplemental oxygen due to desaturation. Will add dulera, discontinue duoneb, place on albuterol q 6 hr and q 2 hr prn, continue antibiotic and solumedrol  Patient had Echocardiogram on 3/16 which reported normal ef of 60-65%

## 2015-05-11 ENCOUNTER — Inpatient Hospital Stay (HOSPITAL_COMMUNITY): Payer: Medicare Other

## 2015-05-11 DIAGNOSIS — I5043 Acute on chronic combined systolic (congestive) and diastolic (congestive) heart failure: Principal | ICD-10-CM

## 2015-05-11 DIAGNOSIS — J9621 Acute and chronic respiratory failure with hypoxia: Secondary | ICD-10-CM

## 2015-05-11 DIAGNOSIS — J449 Chronic obstructive pulmonary disease, unspecified: Secondary | ICD-10-CM

## 2015-05-11 DIAGNOSIS — I482 Chronic atrial fibrillation: Secondary | ICD-10-CM

## 2015-05-11 DIAGNOSIS — R0600 Dyspnea, unspecified: Secondary | ICD-10-CM

## 2015-05-11 LAB — GLUCOSE, CAPILLARY
GLUCOSE-CAPILLARY: 189 mg/dL — AB (ref 65–99)
Glucose-Capillary: 153 mg/dL — ABNORMAL HIGH (ref 65–99)
Glucose-Capillary: 201 mg/dL — ABNORMAL HIGH (ref 65–99)
Glucose-Capillary: 218 mg/dL — ABNORMAL HIGH (ref 65–99)

## 2015-05-11 LAB — BASIC METABOLIC PANEL
Anion gap: 11 (ref 5–15)
BUN: 36 mg/dL — AB (ref 6–20)
CALCIUM: 8.9 mg/dL (ref 8.9–10.3)
CHLORIDE: 94 mmol/L — AB (ref 101–111)
CO2: 36 mmol/L — AB (ref 22–32)
CREATININE: 1.92 mg/dL — AB (ref 0.44–1.00)
GFR calc non Af Amer: 23 mL/min — ABNORMAL LOW (ref >60)
GFR, EST AFRICAN AMERICAN: 27 mL/min — AB (ref >60)
GLUCOSE: 174 mg/dL — AB (ref 65–99)
Potassium: 3.5 mmol/L (ref 3.5–5.1)
Sodium: 141 mmol/L (ref 135–145)

## 2015-05-11 MED ORDER — FUROSEMIDE 10 MG/ML IJ SOLN: 40.0000 mg | Freq: Every day | INTRAMUSCULAR | Status: DC

## 2015-05-11 MED ORDER — FUROSEMIDE 10 MG/ML IJ SOLN
INTRAMUSCULAR | Status: AC
  Filled 2015-05-11: qty 2

## 2015-05-11 MED ORDER — METHYLPREDNISOLONE SODIUM SUCC 125 MG IJ SOLR: 60.0000 mg | Freq: Three times a day (TID) | INTRAMUSCULAR | Status: DC

## 2015-05-11 MED ORDER — PANTOPRAZOLE SODIUM 40 MG PO TBEC
40.0000 mg | DELAYED_RELEASE_TABLET | Freq: Two times a day (BID) | ORAL | Status: DC
  Administered 2015-05-11 – 2015-05-15 (×9): 40 mg via ORAL
  Filled 2015-05-11 (×10): qty 1

## 2015-05-11 MED ORDER — ARFORMOTEROL TARTRATE 15 MCG/2ML IN NEBU
15.0000 ug | INHALATION_SOLUTION | Freq: Two times a day (BID) | RESPIRATORY_TRACT | Status: DC
  Administered 2015-05-11 – 2015-05-16 (×11): 15 ug via RESPIRATORY_TRACT
  Filled 2015-05-11 (×13): qty 2

## 2015-05-11 MED ORDER — ASPIRIN EC 81 MG PO TBEC
81.0000 mg | DELAYED_RELEASE_TABLET | Freq: Every day | ORAL | Status: DC
  Administered 2015-05-11 – 2015-05-15 (×5): 81 mg via ORAL
  Filled 2015-05-11 (×6): qty 1

## 2015-05-11 MED ORDER — FUROSEMIDE 10 MG/ML IJ SOLN
20.0000 mg | Freq: Once | INTRAMUSCULAR | Status: AC
Start: 2015-05-11 — End: 2015-05-11
  Administered 2015-05-11: 20 mg via INTRAVENOUS

## 2015-05-11 MED ORDER — BUDESONIDE 0.25 MG/2ML IN SUSP
0.2500 mg | Freq: Two times a day (BID) | RESPIRATORY_TRACT | Status: DC
  Administered 2015-05-11 – 2015-05-13 (×4): 0.25 mg via RESPIRATORY_TRACT
  Filled 2015-05-11 (×4): qty 2

## 2015-05-11 MED ORDER — METHYLPREDNISOLONE SODIUM SUCC 40 MG IJ SOLR
40.0000 mg | Freq: Two times a day (BID) | INTRAMUSCULAR | Status: DC
  Administered 2015-05-11 – 2015-05-14 (×8): 40 mg via INTRAVENOUS
  Filled 2015-05-11 (×9): qty 1

## 2015-05-11 MED ORDER — ALBUTEROL SULFATE (2.5 MG/3ML) 0.083% IN NEBU
2.5000 mg | INHALATION_SOLUTION | RESPIRATORY_TRACT | Status: DC | PRN
  Administered 2015-05-17: 2.5 mg via RESPIRATORY_TRACT
  Filled 2015-05-11: qty 3

## 2015-05-11 NOTE — Progress Notes (Signed)
Pt taken off Bipap for a break. Breathing is less labored and pt states she is comfortable on the partial rebreather at this time. Pt tolerated Bipap mask well and was eager to wear it this morning. RT will continue to monitor and make RN aware.

## 2015-05-11 NOTE — Consult Note (Signed)
PULMONARY / CRITICAL CARE MEDICINE   Name: [name] MRN: [id number] DOB: Jan 31, 1932    ADMISSION DATE:  06-05-2015 CONSULTATION DATE:  05/12/15   REFERRING MD :  [facility]  CHIEF COMPLAINT:  Sob/ 02 dep  INITIAL PRESENTATION:  81 yowf quit smoking around 1990 with dx of copd/ 02 dep resp failure but no pfts in chart referred to pulmonary in 10/3014 by cards but declined referral and now admtted 9/13 with acute on chronic resp failure with predominant atx changes on cxr which show low volumes so pulmonary asked to eval am 9/19  STUDIES:  Repeat Echo 9/18 >>>  SIGNIFICANT EVENTS:     HISTORY OF PRESENT ILLNESS:  81 y.o. female with a past medical history significant for COPD on 5L home O2 and daily prednisone 10 mg, chronic combined systolic and diastolic heart failure, atrial fibrillation on anticoagulation, pacemaker, NIDDM, hypertension, chronic anemia and CKD stage III-IV who presents with hypoxia and elevated lactic acid.  The patient was accompanied by her daughter who provideed most history as the patient was on Bipap. They went to the patient's PCP this afternoon for a routine physical, waited for 45 minutes with her portable oxygen (she takes 5L at baseline and usually has home SpO2 ~85-88% per daughter). When she got into the examination room, she was cold needing blankets, her fingers were blue and her SpO2 was in the 70s, so EMS was called. They gave nebs and brought her to the ER, where her SpO2 increased to the mid to upper 80s. The patient and her daughter denied new cough, worsening sputum. They note increased leg swelling over the last week PTA and decreased energy over the course of days to weeks.  Marland Kitchen Her baseline weight is 170 lbs per daughter. Last d/c weight was 173 lbs in March.  In the ED, the patient was able to oxygenate in the low to mid-80s on aeromask. She got additional bronchodilators and IV steroid and was prepared for admission. Of note, the patient's  sister died one week ago on Hospice for end-stage CHF.   Since admit pt has remained in step down and bipap dep/ still denies cough or cp.  No obvious day to day or daytime variability or assoc   chest tightness, subjective wheeze or overt sinus or hb symptoms. No unusual exp hx or h/o childhood pna/    Also denies any obvious fluctuation of symptoms with weather or environmental changes or other aggravating or alleviating factors except as outlined above   Current Medications, Allergies, Complete Past Medical History, Past Surgical History, Family History, and Social History were reviewed in [employer] record.  ROS  The following are not active complaints unless bolded sore throat, dysphagia, dental problems, itching, sneezing,  nasal congestion or excess/ purulent secretions, ear ache,   fever, chills, sweats, unintended wt loss, classically pleuritic or exertional cp, hemoptysis,  orthopnea pnd or leg swelling, presyncope, palpitations, abdominal pain, anorexia, nausea, vomiting, diarrhea  or change in bowel or bladder habits, change in stools or urine, dysuria,hematuria,  rash, arthralgias, visual complaints, headache, numbness, weakness or ataxia or problems with walking or coordination,  change in mood/affect or memory.       PAST MEDICAL HISTORY :   has a past medical history of AV block, complete; CHF (congestive heart failure); AAA (abdominal aortic aneurysm); Dyslipidemia; History of colonic polyps; Anxiety and depression; Diverticulosis of colon; Asthma; Acute renal failure; Atrial flutter; Hyperglycemia; UTI (lower urinary tract infection); COPD (chronic  obstructive pulmonary disease); HTN (hypertension); DM (diabetes mellitus); GERD (gastroesophageal reflux disease); DVT (deep venous thrombosis); B12 deficiency; and Angiodysplasia of intestine (without mention of hemorrhage).  has past surgical history that includes Tubal ligation; Abdominal hysterectomy;  Appendectomy; Pacemaker insertion; Colonoscopy (02/06/2002); and Bladder suspension. Prior to Admission medications   Medication Sig Start Date End Date Taking? Authorizing Provider  apixaban (ELIQUIS) 5 MG TABS tablet Take 0.5 tablets (2.5 mg total) by mouth 2 (two) times daily. 11/12/14  Yes Drema Dallas, MD  aspirin 81 MG tablet Take 81 mg by mouth daily.   Yes Historical Provider, MD  Cholecalciferol (VITAMIN D) 2000 UNITS tablet Take 5,000 Units by mouth daily.    Yes Historical Provider, MD  dexlansoprazole (DEXILANT) 60 MG capsule Take 1 capsule (60 mg total) by mouth daily. 04/25/15  Yes Lucky Cowboy, MD  furosemide (LASIX) 40 MG tablet Take 40 mg by mouth daily.  01/30/15  Yes Historical Provider, MD  ipratropium (ATROVENT) 0.06 % nasal spray Place 2 sprays into the nose 2 (two) times daily. 12/20/14  Yes Lucky Cowboy, MD  levalbuterol [facility] HFA) 45 MCG/ACT inhaler Take 1 - 2 puffs -  5 minutes apart  - 4 x day or every 4 hours as needed to rescue Asthma Patient taking differently: Inhale 1 puff into the lungs See admin instructions. Take 1 - 2 puffs -  5 minutes apart  - 4 x day or every 4 hours as needed to rescue Asthma 12/05/14 12/06/15 Yes Lucky Cowboy, MD  levalbuterol [facility]) 1.25 MG/3ML nebulizer solution 1 neb treatment 4 x day Patient taking differently: Take 1.25 mg by nebulization every 6 (six) hours as needed for shortness of breath. 1 neb treatment 4 x day 12/05/14 12/06/15 Yes Lucky Cowboy, MD  levETIRAcetam (KEPPRA) 500 MG tablet Take 1 tablet (500 mg total) by mouth 2 (two) times daily. 12/05/14  Yes Lucky Cowboy, MD  linagliptin (TRADJENTA) 5 MG TABS tablet Take 1 tablet (5 mg total) by mouth daily. 06/07/14  Yes Russella Dar, NP  metFORMIN (GLUCOPHAGE-XR) 500 MG 24 hr tablet Take 500 mg by mouth daily.   Yes Historical Provider, MD  montelukast (SINGULAIR) 10 MG tablet Take 1 tablet (10 mg total) by mouth at bedtime. 02/17/15  Yes Lucky Cowboy, MD   PARoxetine (PAXIL) 20 MG tablet Take 10 mg by mouth daily.   Yes Historical Provider, MD  predniSONE (DELTASONE) 10 MG tablet Take 10 mg by mouth daily with breakfast.   Yes Historical Provider, MD  simvastatin (ZOCOR) 80 MG tablet Take 40 mg by mouth See admin instructions. Only take on Mon, Wed, and Fridays per daughter   Yes Historical Provider, MD  tiotropium (SPIRIVA) 18 MCG inhalation capsule Place 18 mcg into inhaler and inhale daily.   Yes Historical Provider, MD  ONE TOUCH ULTRA TEST test strip USE TO CHECK BLOOD GLUCOSE ONCE A DAY AS INSTRUCTED 05/19/14   Lucky Cowboy, MD   Allergies  Allergen Reactions  . Advair Diskus [Fluticasone-Salmeterol]     Itching  . Biaxin [Clarithromycin]     N/V  . Ciprofloxacin     N/V  . Doxycycline Other (See Comments)    Inside of mouth and tongue red and peeled  . Keflex [Cephalexin]     N/V  . Ketek [Telithromycin]     N/V  . Levaquin [Levofloxacin In D5w] Other (See Comments)    Arrythmia, polymorphic Vtach on 10/15 hospitalization  . Macrobid [Nitrofurantoin]     N/V  .  Penicillins     Rash  . Sulfonamide Derivatives     unknown    FAMILY HISTORY:  indicated that her mother is deceased. She indicated that her father is deceased. She indicated that both of her sisters are deceased. She reported the following about her brother: (sibiling) neoplasm, maignant, colon .  SOCIAL HISTORY:  reports that she quit smoking about 26 years ago. She started smoking about 61 years ago. She has never used smokeless tobacco. She reports that she does not drink alcohol or use illicit drugs.     SUBJECTIVE:   VITAL SIGNS: Temp:  [97.8 F (36.6 C)-100.2 F (37.9 C)] 98.9 F (37.2 C) (09/18 0940) Pulse Rate:  [63-88] 88 (09/18 0940) Resp:  [16-31] 26 (09/18 0940) BP: (116-147)/(55-98) 125/59 mmHg (09/18 0940) SpO2:  [85 %-100 %] 90 % (09/18 0834) Weight:  [164 lb 9.6 oz (74.662 kg)] 164 lb 9.6 oz (74.662 kg) (09/18 0500) HEMODYNAMICS:    VENTILATOR SETTINGS:   INTAKE / OUTPUT:  Intake/Output Summary (Last 24 hours) at 05/11/15 1131 Last data filed at 05/11/15 0438  Gross per 24 hour  Intake      0 ml  Output    825 ml  Net   -825 ml    PHYSICAL EXAMINATION:  Wt Readings from Last 3 Encounters:  05/11/15 164 lb 9.6 oz (74.662 kg)  11/22/14 182 lb (82.555 kg)  11/12/14 168 lb 14 oz (76.6 kg)    Vital signs reviewed  General:  Very debilitated appearly elderly wf >> stated age sitting in recliner on bipap Pt alert, approp nad but looks worn out No jvd Oropharynx clear Neck supple Lungs with a bilateral coarse basilar insp and exp rhonchi but no wheezing at all  IRIR no s3 or or sign murmur Abd soft/ limited excursion  Extr wam with no edema or clubbing noted Neuro  No motor deficits   LABS:  CBC  Recent Labs Lab 05/05/2015 1954 05/05/2015 1957 05/07/15 0112  WBC  --  9.0 5.7  HGB 13.3 10.6* 10.2*  HCT 39.0 39.1 36.4  PLT  --  145* 115*   Coag's No results for input(s): APTT, INR in the last 168 hours. BMET  Recent Labs Lab 05/07/15 0112 05/09/15 0258 05/09/15 0430 05/09/15 1246 05/10/15 0347  NA 142 139  --   --  140  K 3.7 6.2* 5.8* 6.0* 5.3*  CL 95* 98*  --   --  98*  CO2 32 32  --   --  31  BUN 19 43*  --   --  46*  CREATININE 1.90* 2.08*  --   --  2.03*  GLUCOSE 267* 191*  --   --  247*   Electrolytes  Recent Labs Lab 05/07/15 0112 05/09/15 0258 05/10/15 0347  CALCIUM 9.0 8.9 9.1   Sepsis Markers  Recent Labs Lab 05/07/15 0112 05/07/15 0520 05/09/15 0258  LATICACIDVEN 6.0* 3.6* 1.7   ABG  Recent Labs Lab 05/02/2015 2034  PHART 7.379  PCO2ART 50.5*  PO2ART 39.0*   Liver Enzymes  Recent Labs Lab 04/27/2015 1957 05/07/15 0112  AST 70* 59*  ALT 19 20  ALKPHOS 93 88  BILITOT 2.1* 1.6*  ALBUMIN 3.6 3.5   Cardiac Enzymes No results for input(s): TROPONINI, PROBNP in the last 168 hours. Glucose  Recent Labs Lab 05/09/15 2216 05/10/15 0825 05/10/15 1238  05/10/15 1733 05/10/15 2108 05/11/15 0824  GLUCAP 199* 222* 237* 184* 144* 153*     Images reviewed:  Last pa and lateral in system is form 06/08/11 with all pCXR since   Last pCXR  05/08/15 bilateral basilar atx/ massive CM    ASSESSMENT / PLAN:  1) acute on chronic respiratory failure with bilateral atx changes on cxr - quit smoking 26 y PTA/ no pfts on file   NB   When respiratory symptoms begin or become refractory well after a patient reports complete smoking cessation,  Especially when this wasn't the case while they were smoking, a red flag is raised based on the work of Dr Primitivo Gauze which states:  if you quit smoking when your best day FEV1 is still well preserved it is highly unlikely you will progress to severe disease.  That is to say, once the smoking stops,  the symptoms should not suddenly erupt or markedly worsen.  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  Agree with trying to wean bipap/ mobilize/ diures to no edema if bun/creat/ bp allow and we will f/u to see if we have anything else to offer here.  At this point would try pulmocort/brovana as your laba/ics as doubt she can  inhale mdis     Sandrea Hughs, MD Pulmonary and Critical Care Medicine [facility] Cell [phone] After 5:30 PM or weekends, call [phone]

## 2015-05-11 NOTE — Progress Notes (Signed)
05/11/2015 Patient at 0859 had 4 beat run and 6 beats run, then a pair of pvc's and Dr Cena Benton was made aware. [facility] RN.

## 2015-05-11 NOTE — Progress Notes (Signed)
05/11/2015 Patient on 05/10/2015 had 3 beat run of pvc at 1208. Dr Cena Benton was made aware. [facility] RN.

## 2015-05-11 NOTE — Progress Notes (Signed)
05/11/2015 urine out put was not recorded in computer. It was charted her last void was at 0400. Bladder scan was done at 2022 she had 293 in bladder. Benedetto Coons (NP)was called and made aware. Per NP let patient try to void and if unable to notify NP. First shift nurse made Tresa Endo (RN) from third shift aware. [facility] RN.

## 2015-05-11 NOTE — Progress Notes (Signed)
Pt assessed per RN request. In for scheduled treatment, pt noted to be in the tripod posture, increased WOB noted as well. BBS reveal basilar crackles. Albuterol administered. Pt refusing BiPAP at this time. RN communicating with team, pt may benefit from diuresis in lieu of positive pressure. Pt returned to partial rebreather. Will continue to monitor closely.

## 2015-05-11 NOTE — Progress Notes (Signed)
[facility] PROGRESS NOTE  [name]:[id number] DOB: 1932-01-12 DOA: 05-29-2015 PCP: Nadean Corwin, MD  Assessment/Plan: 1- Acute on Chronic resp failure with hypoxia: patient chronically on 5-6L at home; due to COPD. Presented with worsening SOB, hypoxia and increased LE swelling. Patient worsening breathing due to acute on chronic diastolic HF and mild COPD exacerbation. -will continue weaning oxygen supplementation as tolerated; patient steadily improving. She uses 5-6L at baseline. Goal is for O2 sat 88-92%. On non rebreather currently. Discussed care plan with nursing and goal for her SO2. Failed attempts at weaning her to her [place] home regimen. - Place on IV lasix regimen, monitor daily weights and intake and output. - repeat chest x ray today. Pt currently on bipap - change solumedrol to 60 mg IV q 8 hours. Pt has history of CHF. Last echocardiogram on 11/05/14 reported EF of 60-65% with report stating that the study was not technically sufficient to allow evaluation of LV diastolic function. Will repeat echocardiogram.  2-chronic atrial fibrillation: CHADsVASC score 6 - will continue apixaban - rate controlled - Stable  3-HLD: in patient with hx of PVD and AAA -continue statins - Stable  4-Hx of AAA: stable and follow as an outpatient -continue abd [place] checks periodically - After discharge patient should follow-up with primary care physician or vascular surgeon for continued monitoring  5-GERD: continue PPI, stable  6-type 2 diabetes: with PVD, renal manifestation and hyperglycemia -will use SSI and adjust as needed -Hyperglycemia secondary to use of steroids  7-essential HTN: stable and well controlled -Will continue to monitor  8-hx of seizures: continue keppra  9-elevated lactic acid: trending down -will continue holding metformin -resolved  10-acute on CKD stage 3:  -continue diuresis  11. Hyperkalemia - Will administer kayexalate today.  Will repeat levels. Trending down - Had lasix 20 mg IV x 1 today   Code Status: DNR Family Communication: no family at bedside  Disposition Plan: remains in stepdown while determine stability and no further needs of BIPAP   Consultants:  None   PCCM (curbside; due to low sat on BIPAP; they expressed patient with chronic resp failure due to COPD and 5L O2 supplementation. Aim treatment to get her off BIPAP to an O2 sat of 88% and continue tx for CHF)  Procedures:  See below for x-ray reports   Antibiotics:  None   HPI/Subjective: Pt has no new complaints.  Objective: Filed Vitals:   05/11/15 0940  BP: 125/59  Pulse: 88  Temp: 98.9 F (37.2 C)  Resp: 26    Intake/Output Summary (Last 24 hours) at 05/11/15 1048 Last data filed at 05/11/15 0438  Gross per 24 hour  Intake      0 ml  Output    825 ml  Net   -825 ml   Filed Weights   05/09/15 0441 05/10/15 0816 05/11/15 0500  Weight: 78.6 kg (173 lb 4.5 oz) 78.563 kg (173 lb 3.2 oz) 74.662 kg (164 lb 9.6 oz)    Exam:   General:  Alert and awake, in NAD  Cardiovascular: positive JVD, rate controlled, no rubs or gallops  Respiratory: no wheezing; improved air movement, positive fine bibasilar crackles BL. No use of accessory muscles, on bipap  Abdomen: soft, NT, ND, positive BS  Musculoskeletal: no cyanosis, no erythema  Data Reviewed: Basic Metabolic Panel:  Recent Labs Lab 05-29-15 1954 2015-05-29 1957 05/07/15 0112 05/09/15 0258 05/09/15 0430 05/09/15 1246 05/10/15 0347  NA 141 143 142 139  --   --  140  K 3.2* 3.4* 3.7 6.2* 5.8* 6.0* 5.3*  CL 95* 98* 95* 98*  --   --  98*  CO2  --  28 32 32  --   --  31  GLUCOSE 184* 191* 267* 191*  --   --  247*  BUN 26* 19 19 43*  --   --  46*  CREATININE 1.50* 1.79* 1.90* 2.08*  --   --  2.03*  CALCIUM  --  9.1 9.0 8.9  --   --  9.1   Liver Function Tests:  Recent Labs Lab 05/11/2015 1957 05/07/15 0112  AST 70* 59*  ALT 19 20  ALKPHOS 93 88  BILITOT  2.1* 1.6*  PROT 6.2* 5.8*  ALBUMIN 3.6 3.5   CBC:  Recent Labs Lab 05/12/2015 1954 05/14/2015 1957 05/07/15 0112  WBC  --  9.0 5.7  HGB 13.3 10.6* 10.2*  HCT 39.0 39.1 36.4  MCV  --  86.7 86.5  PLT  --  145* 115*   BNP (last 3 results)  Recent Labs  05/02/2015 1958  BNP 1843.2*    ProBNP (last 3 results)  Recent Labs  06/02/14  PROBNP 13998.0*    CBG:  Recent Labs Lab 05/10/15 0825 05/10/15 1238 05/10/15 1733 05/10/15 2108 05/11/15 0824  GLUCAP 222* 237* 184* 144* 153*    Recent Results (from the past 240 hour(s))  MRSA PCR Screening     Status: None   Collection Time: 05/19/2015  9:47 PM  Result Value Ref Range Status   MRSA by PCR NEGATIVE NEGATIVE Final    Comment:        The GeneXpert MRSA Assay (FDA approved for NASAL specimens only), is one component of a comprehensive MRSA colonization surveillance program. It is not intended to diagnose MRSA infection nor to guide or monitor treatment for MRSA infections.      Studies: No results found.  Scheduled Meds: . albuterol  2.5 mg Nebulization Q6H  . apixaban  2.5 mg Oral BID  . aspirin EC  81 mg Oral Daily  . atorvastatin  40 mg Oral Q M,W,F  . furosemide  20 mg Oral Daily  . insulin aspart  0-15 Units Subcutaneous TID WC  . levETIRAcetam  500 mg Oral BID  . methylPREDNISolone (SOLU-MEDROL) injection  60 mg Intravenous Daily  . mometasone-formoterol  2 puff Inhalation BID  . montelukast  10 mg Oral QHS  . pantoprazole  40 mg Oral Daily  . PARoxetine  10 mg Oral Daily  . tiotropium  18 mcg Inhalation Daily   Continuous Infusions:   Principal Problem:   Acute on chronic respiratory failure with hypoxia Active Problems:   Hypertension   COPD (chronic obstructive pulmonary disease)   T2_NIDDM w/CKD   Chronic atrial fibrillation   COPD, severe   Chronic renal failure, stage 3 (moderate)   Acute on chronic combined systolic and diastolic congestive heart failure   Acute and chronic  respiratory failure with hypoxia   Hyperbilirubinemia   Elevated lactic acid level    Time spent: 35 minutes    Penny Pia  [facility] Pager [phone]. If 7PM-7AM, please contact night-coverage at [email or web address], password [facility] 05/11/2015, 10:48 AM  LOS: 5 days

## 2015-05-12 ENCOUNTER — Inpatient Hospital Stay (HOSPITAL_COMMUNITY): Payer: Medicare Other

## 2015-05-12 DIAGNOSIS — R0902 Hypoxemia: Secondary | ICD-10-CM

## 2015-05-12 DIAGNOSIS — J810 Acute pulmonary edema: Secondary | ICD-10-CM

## 2015-05-12 DIAGNOSIS — J431 Panlobular emphysema: Secondary | ICD-10-CM

## 2015-05-12 LAB — CBC
HCT: 35.4 % — ABNORMAL LOW (ref 36.0–46.0)
Hemoglobin: 10.1 g/dL — ABNORMAL LOW (ref 12.0–15.0)
MCH: 24 pg — AB (ref 26.0–34.0)
MCHC: 28.5 g/dL — AB (ref 30.0–36.0)
MCV: 84.1 fL (ref 78.0–100.0)
PLATELETS: 109 10*3/uL — AB (ref 150–400)
RBC: 4.21 MIL/uL (ref 3.87–5.11)
RDW: 16.2 % — ABNORMAL HIGH (ref 11.5–15.5)
WBC: 10.9 10*3/uL — ABNORMAL HIGH (ref 4.0–10.5)

## 2015-05-12 LAB — GLUCOSE, CAPILLARY
GLUCOSE-CAPILLARY: 201 mg/dL — AB (ref 65–99)
Glucose-Capillary: 250 mg/dL — ABNORMAL HIGH (ref 65–99)
Glucose-Capillary: 163 mg/dL — ABNORMAL HIGH (ref 65–99)
Glucose-Capillary: 242 mg/dL — ABNORMAL HIGH (ref 65–99)

## 2015-05-12 MED ORDER — FUROSEMIDE 10 MG/ML IJ SOLN
40.0000 mg | Freq: Every day | INTRAMUSCULAR | Status: DC
  Administered 2015-05-12: 40 mg via INTRAVENOUS
  Filled 2015-05-12: qty 4

## 2015-05-12 NOTE — Progress Notes (Signed)
[facility] PROGRESS NOTE  IDABELL PICKING [name]:[id number] DOB: 1931/11/16 DOA: May 14, 2015 PCP: Nadean Corwin, MD  Assessment/Plan: 1- Acute on Chronic resp failure with hypoxia/ Acute diastolic heart failure: patient chronically on 5-6L at home; due to COPD. Presented with worsening SOB, hypoxia and increased LE swelling. Patient worsening breathing due to acute on chronic diastolic HF and mild COPD exacerbation. -will continue weaning oxygen supplementation as tolerated; patient steadily improving. She uses 5-6L at baseline. Goal is for O2 sat 88-92%. On non rebreather currently.  - Place on IV lasix regimen, monitor daily weights and intake and output. - repeat chest x ray reported congestive heart failure with mild interstitial edema - change solumedrol per pulmonary team Pt has history of CHF. Echocardiogram reporting EF of 55-60% - Consult palliative for goals of care for planning should patient's condition not improve despite aggressive measures. - diuresis complicated due to CKD and low blood pressures.  Also difficult to treat CHF due to soft blood pressures limiting use of medication which will affect blood pressures  2-chronic atrial fibrillation: CHADsVASC score 6 - will continue apixaban - rate controlled - Stable  3-HLD: in patient with hx of PVD and AAA -continue statins - Stable  4-Hx of AAA: stable and follow as an outpatient -continue abd [place] checks periodically - After discharge patient should follow-up with primary care physician or vascular surgeon for continued monitoring  5-GERD: continue PPI, stable  6-type 2 diabetes: with PVD, renal manifestation and hyperglycemia -will use SSI and adjust as needed -Hyperglycemia secondary to use of steroids  7-essential HTN: stable and well controlled -Will continue to monitor  8-hx of seizures: continue keppra  9-elevated lactic acid: trending down -will continue holding metformin -resolved  10-acute  on CKD stage 3:  -continue diuresis  11. Hyperkalemia - Will administer kayexalate today. Will repeat levels. Trending down - Had lasix 20 mg IV x 1 today   Code Status: DNR Family Communication: no family at bedside  Disposition Plan: pending improvement in condition   Consultants:  Palliative care  PCCM   Procedures:  See below for x-ray reports   Antibiotics:  None   HPI/Subjective: Pt has no new complaints. She wishes to go home soon  Objective: Filed Vitals:   05/12/15 1000  BP: 111/55  Pulse: 73  Temp:   Resp: 22    Intake/Output Summary (Last 24 hours) at 05/12/15 1208 Last data filed at 05/12/15 0600  Gross per 24 hour  Intake    240 ml  Output    375 ml  Net   -135 ml   Filed Weights   05/10/15 0816 05/11/15 0500 05/12/15 0500  Weight: 78.563 kg (173 lb 3.2 oz) 74.662 kg (164 lb 9.6 oz) 77.338 kg (170 lb 8 oz)    Exam:   General:  Alert and awake, in NAD  Cardiovascular: positive JVD, rate controlled, no rubs or gallops  Respiratory: no wheezing, positive fine bibasilar crackles BL. No use of accessory muscles, on non rebreather  Abdomen: soft, NT, ND, positive BS  Musculoskeletal: no cyanosis, no erythema  Data Reviewed: Basic Metabolic Panel:  Recent Labs Lab 05/14/15 1957 05/07/15 0112 05/09/15 0258 05/09/15 0430 05/09/15 1246 05/10/15 0347 05/11/15 1120  NA 143 142 139  --   --  140 141  K 3.4* 3.7 6.2* 5.8* 6.0* 5.3* 3.5  CL 98* 95* 98*  --   --  98* 94*  CO2 28 32 32  --   --  31 36*  GLUCOSE 191* 267* 191*  --   --  247* 174*  BUN 19 19 43*  --   --  46* 36*  CREATININE 1.79* 1.90* 2.08*  --   --  2.03* 1.92*  CALCIUM 9.1 9.0 8.9  --   --  9.1 8.9   Liver Function Tests:  Recent Labs Lab 05/20/2015 1957 05/07/15 0112  AST 70* 59*  ALT 19 20  ALKPHOS 93 88  BILITOT 2.1* 1.6*  PROT 6.2* 5.8*  ALBUMIN 3.6 3.5   CBC:  Recent Labs Lab 04/30/2015 1954 04/24/2015 1957 05/07/15 0112 05/12/15 0559  WBC  --  9.0  5.7 10.9*  HGB 13.3 10.6* 10.2* 10.1*  HCT 39.0 39.1 36.4 35.4*  MCV  --  86.7 86.5 84.1  PLT  --  145* 115* 109*   BNP (last 3 results)  Recent Labs  05/03/2015 1958  BNP 1843.2*    ProBNP (last 3 results)  Recent Labs  06/02/14  PROBNP 13998.0*    CBG:  Recent Labs Lab 05/11/15 1326 05/11/15 1600 05/11/15 2137 05/12/15 0835 05/12/15 1200  GLUCAP 201* 189* 218* 201* 250*    Recent Results (from the past 240 hour(s))  MRSA PCR Screening     Status: None   Collection Time: 04/27/2015  9:47 PM  Result Value Ref Range Status   MRSA by PCR NEGATIVE NEGATIVE Final    Comment:        The GeneXpert MRSA Assay (FDA approved for NASAL specimens only), is one component of a comprehensive MRSA colonization surveillance program. It is not intended to diagnose MRSA infection nor to guide or monitor treatment for MRSA infections.      Studies: Dg Chest Port 1 View  05/11/2015   CLINICAL DATA:  Shortness of breath  EXAM: PORTABLE CHEST - 1 VIEW  COMPARISON:  05/08/2015  FINDINGS: Mild to moderate cardiac enlargement. Mild vascular congestion. Mild diffuse interstitial change with Kerley B-lines noted. Retrocardiac area not well evaluated. No change 2 lead cardiac pacer.  IMPRESSION: Congestive heart failure with mild interstitial edema.   Electronically Signed   By: Esperanza Heir M.D.   On: 05/11/2015 14:21    Scheduled Meds: . apixaban  2.5 mg Oral BID  . arformoterol  15 mcg Nebulization BID  . aspirin EC  81 mg Oral Daily  . atorvastatin  40 mg Oral Q M,W,F  . budesonide  0.25 mg Nebulization BID  . furosemide  40 mg Intravenous Daily  . insulin aspart  0-15 Units Subcutaneous TID WC  . levETIRAcetam  500 mg Oral BID  . methylPREDNISolone (SOLU-MEDROL) injection  40 mg Intravenous Q12H  . montelukast  10 mg Oral QHS  . pantoprazole  40 mg Oral BID AC  . PARoxetine  10 mg Oral Daily   Continuous Infusions:   Principal Problem:   Acute on chronic respiratory  failure with hypoxia Active Problems:   Hypertension   COPD (chronic obstructive pulmonary disease)   T2_NIDDM w/CKD   Chronic atrial fibrillation   COPD, severe   Chronic renal failure, stage 3 (moderate)   Acute on chronic combined systolic and diastolic congestive heart failure   Acute and chronic respiratory failure with hypoxia   Hyperbilirubinemia   Elevated lactic acid level    Time spent: 35 minutes    [name]  [facility] Pager [phone]. If 7PM-7AM, please contact night-coverage at [email or web address], password [facility] 05/12/2015, 12:08 PM  LOS: 6 days

## 2015-05-12 NOTE — Progress Notes (Signed)
PULMONARY / CRITICAL CARE MEDICINE   Name: [name] MRN: [id number] DOB: 1932/06/19    ADMISSION DATE:  05/23/15 CONSULTATION DATE:  05/12/15   REFERRING MD :  [facility]  CHIEF COMPLAINT:  Sob/ 02 dep  INITIAL PRESENTATION:  83 yowf quit smoking around 1990 with dx of copd/ 02 dep resp failure admtted 9/13 with acute on chronic resp failure with predominant atx changes on cxr which show low volumes so pulmonary asked to eval am 9/18  STUDIES:  Repeat Echo 9/18 >>>EF 55-60%, severely dilated RA, mod TR, PA press  9/18 BLE venous dopplers>>>  SIGNIFICANT EVENTS:    SUBJECTIVE:  Sitting OOB in chair on NRB.  "Feels bad" but no specific c/o.  Denies chest pain, dyspnea.   VITAL SIGNS: Temp:  [97.4 F (36.3 C)-99.6 F (37.6 C)] 97.4 F (36.3 C) (09/19 0835) Pulse Rate:  [68-81] 73 (09/19 1000) Resp:  [16-30] 22 (09/19 1000) BP: (111-148)/(55-125) 111/55 mmHg (09/19 1000) SpO2:  [77 %-100 %] 99 % (09/19 1000) FiO2 (%):  [80 %] 80 % (09/18 1430) Weight:  [170 lb 8 oz (77.338 kg)] 170 lb 8 oz (77.338 kg) (09/19 0500) HEMODYNAMICS:   VENTILATOR SETTINGS: Vent Mode:  [-]  FiO2 (%):  [80 %] 80 % INTAKE / OUTPUT:  Intake/Output Summary (Last 24 hours) at 05/12/15 1111 Last data filed at 05/12/15 0600  Gross per 24 hour  Intake    300 ml  Output    375 ml  Net    -75 ml    PHYSICAL EXAMINATION:  Wt Readings from Last 3 Encounters:  05/12/15 170 lb 8 oz (77.338 kg)  11/22/14 182 lb (82.555 kg)  11/12/14 168 lb 14 oz (76.6 kg)    EXAM: General: chronically ill appearing female, uncomfortable appearing OOB in chair on NRB Neuro: slow to respond but awake, follows commands, nods appropriately CV:  s1s2 rrr PULM: resps even, non labored on NRB, diminished bases but mostly clear  GI: abd soft, +bs Extremities:  Warm and dry, 1+ BLE edema, symmetric    LABS:  CBC  Recent Labs Lab 05/23/2015 1957 05/07/15 0112 05/12/15 0559  WBC 9.0 5.7 10.9*  HGB 10.6*  10.2* 10.1*  HCT 39.1 36.4 35.4*  PLT 145* 115* 109*   Coag's No results for input(s): APTT, INR in the last 168 hours. BMET  Recent Labs Lab 05/09/15 0258  05/09/15 1246 05/10/15 0347 05/11/15 1120  NA 139  --   --  140 141  K 6.2*  < > 6.0* 5.3* 3.5  CL 98*  --   --  98* 94*  CO2 32  --   --  31 36*  BUN 43*  --   --  46* 36*  CREATININE 2.08*  --   --  2.03* 1.92*  GLUCOSE 191*  --   --  247* 174*  < > = values in this interval not displayed. Electrolytes  Recent Labs Lab 05/09/15 0258 05/10/15 0347 05/11/15 1120  CALCIUM 8.9 9.1 8.9   Sepsis Markers  Recent Labs Lab 05/07/15 0112 05/07/15 0520 05/09/15 0258  LATICACIDVEN 6.0* 3.6* 1.7   ABG  Recent Labs Lab 05/23/2015 2034  PHART 7.379  PCO2ART 50.5*  PO2ART 39.0*   Liver Enzymes  Recent Labs Lab 2015/05/23 1957 05/07/15 0112  AST 70* 59*  ALT 19 20  ALKPHOS 93 88  BILITOT 2.1* 1.6*  ALBUMIN 3.6 3.5   Cardiac Enzymes No results for input(s): TROPONINI, PROBNP in the  last 168 hours. Glucose  Recent Labs Lab 05/10/15 2108 05/11/15 0824 05/11/15 1326 05/11/15 1600 05/11/15 2137 05/12/15 0835  GLUCAP 144* 153* 201* 189* 218* 201*     Images reviewed: Last pCXR  05/08/15 bilateral basilar atx/ massive CM    ASSESSMENT / PLAN:  Acute on chronic hypoxic respiratory failure with bilateral atx changes on cxr Hx COPD on 5-6L home O2  Acute on chronic diastolic CHF  Acute on CKD 3  HTN  Chronic AFib on eliquis   Significant changes in echo with severely dilated RA and PA pressure (since last echo in March 2016) certainly raises suspicion for PE given ongoing severe hypoxia requiring NRB despite diuresis and rx for AECOPD.  Not bronchospastic on exam.  However, pt on eliquis at home which would make PE much less likely.  Acute on CKD3 makes CTA more difficult and she certainly has other explanations for her respiratory failure.   PLAN - Continue gentle diuresis as Scr and BP  tolerate  High flow O2 - wean as able to keep sats 88-92%  PRN bipap for increased WOB Solumedrol  BD's  - change to pulmicort/brovana for now  d/c spiriva for now as she is unlikely to be able to inhale  Max PPI  Continue eliquis  Will go ahead with BLE venous doppler to r/o DVT   Dirk Dress, NP 05/12/2015  11:11 AM Pager: [phone] or [phone]  Attending Note:  81 year old female with history of COPD and CHF who had a normal PAP in march now with PAP in the 60's with acute on chronic hypoxemic respiratory failure as well as a COPD exacerbation.  DDx of acute on chronic PE vs worsening heart failure, unlikely to develop IPF at her age that quickly.  I reviewed CXR myself with evidence of pulmonary edema.  Discussed with PCCM NP and bedside RN.  Acute on chronic hypoxemic respiratory failure: concern for oxidative damage to the lung at this point.  - Diureses as renal function allows.  - Treat COPD as ordered.  - Would like to evaluate for PE but renal function precludes, regardless will be on anti-coagulation.  - Titrate O2 for sat of 88-92%.  Acute worsening of PAP.  - May consider vasodilators BP permitting.   - Diureses as able.  - Anti-coag as ordered (eliquis).  DVT/PT:  - Continue anti-coagulation.  - Lower ext dopplers ordered, if positive will need an IVC filter.  COPD:  - Continue spiriva.  - Continue symbicort.  - Solumedrol as ordered.  Patient seen and examined, agree with above note.  I dictated the care and orders written for this patient under my direction.  Alyson Reedy, MD [phone]

## 2015-05-12 NOTE — Progress Notes (Signed)
*  PRELIMINARY RESULTS* Vascular Ultrasound Lower extremity venous duplex has been completed.  Preliminary findings: Appears to be indeterminate age versus chronic DVT in the left common femoral and proximal femoral veins. No DVT RLE.   Farrel Demark, RDMS, RVT  05/12/2015, 4:25 PM

## 2015-05-12 NOTE — Progress Notes (Signed)
Patient is currently resting comfortably on a partial rebreather at 15L. Patient is in no distress at this time. BIPAP is not needed at this time.

## 2015-05-13 DIAGNOSIS — R0902 Hypoxemia: Secondary | ICD-10-CM

## 2015-05-13 LAB — GLUCOSE, CAPILLARY
GLUCOSE-CAPILLARY: 260 mg/dL — AB (ref 65–99)
Glucose-Capillary: 198 mg/dL — ABNORMAL HIGH (ref 65–99)
Glucose-Capillary: 141 mg/dL — ABNORMAL HIGH (ref 65–99)
Glucose-Capillary: 260 mg/dL — ABNORMAL HIGH (ref 65–99)

## 2015-05-13 MED ORDER — IPRATROPIUM-ALBUTEROL 0.5-2.5 (3) MG/3ML IN SOLN
3.0000 mL | Freq: Four times a day (QID) | RESPIRATORY_TRACT | Status: DC
  Administered 2015-05-13 – 2015-05-16 (×11): 3 mL via RESPIRATORY_TRACT
  Filled 2015-05-13 (×11): qty 3

## 2015-05-13 MED ORDER — BUDESONIDE 0.25 MG/2ML IN SUSP
0.5000 mg | Freq: Two times a day (BID) | RESPIRATORY_TRACT | Status: DC
  Administered 2015-05-13 – 2015-05-16 (×6): 0.5 mg via RESPIRATORY_TRACT
  Filled 2015-05-13 (×7): qty 4

## 2015-05-13 MED ORDER — POTASSIUM CHLORIDE CRYS ER 20 MEQ PO TBCR
20.0000 meq | EXTENDED_RELEASE_TABLET | Freq: Once | ORAL | Status: AC
  Administered 2015-05-13: 20 meq via ORAL
  Filled 2015-05-13: qty 1

## 2015-05-13 MED ORDER — FUROSEMIDE 10 MG/ML IJ SOLN
40.0000 mg | Freq: Two times a day (BID) | INTRAMUSCULAR | Status: DC
  Administered 2015-05-13 (×2): 40 mg via INTRAVENOUS
  Filled 2015-05-13 (×2): qty 4

## 2015-05-13 MED ORDER — IPRATROPIUM-ALBUTEROL 0.5-2.5 (3) MG/3ML IN SOLN: 3.0000 mL | Freq: Four times a day (QID) | RESPIRATORY_TRACT | Status: DC

## 2015-05-13 NOTE — Progress Notes (Signed)
Utilization Review Completed.  

## 2015-05-13 NOTE — Progress Notes (Signed)
PULMONARY / CRITICAL CARE MEDICINE   Name: [name] MRN: [id number] DOB: [birth year]    ADMISSION DATE:  05/23/2015 CONSULTATION DATE:  05/12/15   REFERRING MD :  [facility]  CHIEF COMPLAINT:  Sob/ 02 dep  INITIAL PRESENTATION:  83 yowf quit smoking around 1990 with dx of copd/ 02 dep resp failure admtted 9/13 with acute on chronic resp failure with predominant atx changes on cxr which show low volumes so pulmonary asked to eval am 9/18  STUDIES:  Repeat Echo 9/18 >>> EF 55-60%, severely dilated RA, mod TR, PA press  9/18 BLE venous dopplers >>>  SIGNIFICANT EVENTS:    SUBJECTIVE:  Sitting in chair visiting with son.  NRB in place.  She states "I feel about as usual".  Denies chest pain, cough, fevers/chills/sweats.  VITAL SIGNS: Temp:  [97 F (36.1 C)-97.8 F (36.6 C)] 97 F (36.1 C) (09/20 0800) Pulse Rate:  [70-74] 74 (09/20 0800) Resp:  [18-29] 22 (09/20 0800) BP: (96-147)/(56-82) 130/67 mmHg (09/20 0800) SpO2:  [92 %-100 %] 94 % (09/20 0800) Weight:  [78.699 kg (173 lb 8 oz)] 78.699 kg (173 lb 8 oz) (09/20 0500) HEMODYNAMICS:   VENTILATOR SETTINGS:   INTAKE / OUTPUT:  Intake/Output Summary (Last 24 hours) at 05/13/15 1025 Last data filed at 05/13/15 0900  Gross per 24 hour  Intake   1320 ml  Output      0 ml  Net   1320 ml    PHYSICAL EXAMINATION:  Wt Readings from Last 3 Encounters:  05/13/15 78.699 kg (173 lb 8 oz)  11/22/14 82.555 kg (182 lb)  11/12/14 76.6 kg (168 lb 14 oz)    EXAM: General: chronically ill appearing female, sitting in recliner visiting with son, in NAD. Neuro: A&O x 3 though slow to respond.  MAE's and follows simple commands. CV:  IRIR, no M/R/G. PULM: resps eve and non labored on NRB, diminished bases but clear without W/R/R. GI: BS x 4, soft, NT/ND. Extremities:  Warm and dry, 1+ BLE edema, symmetric.    LABS:  CBC  Recent Labs Lab 04/26/2015 1957 05/07/15 0112 05/12/15 0559  WBC 9.0 5.7 10.9*  HGB 10.6*  10.2* 10.1*  HCT 39.1 36.4 35.4*  PLT 145* 115* 109*   Coag's No results for input(s): APTT, INR in the last 168 hours. BMET  Recent Labs Lab 05/09/15 0258  05/09/15 1246 05/10/15 0347 05/11/15 1120  NA 139  --   --  140 141  K 6.2*  < > 6.0* 5.3* 3.5  CL 98*  --   --  98* 94*  CO2 32  --   --  31 36*  BUN 43*  --   --  46* 36*  CREATININE 2.08*  --   --  2.03* 1.92*  GLUCOSE 191*  --   --  247* 174*  < > = values in this interval not displayed. Electrolytes  Recent Labs Lab 05/09/15 0258 05/10/15 0347 05/11/15 1120  CALCIUM 8.9 9.1 8.9   Sepsis Markers  Recent Labs Lab 05/07/15 0112 05/07/15 0520 05/09/15 0258  LATICACIDVEN 6.0* 3.6* 1.7   ABG  Recent Labs Lab 05/09/2015 2034  PHART 7.379  PCO2ART 50.5*  PO2ART 39.0*   Liver Enzymes  Recent Labs Lab 05/14/2015 1957 05/07/15 0112  AST 70* 59*  ALT 19 20  ALKPHOS 93 88  BILITOT 2.1* 1.6*  ALBUMIN 3.6 3.5   Cardiac Enzymes No results for input(s): TROPONINI, PROBNP in the last 168 hours.  Glucose  Recent Labs Lab 05/11/15 2137 05/12/15 0835 05/12/15 1200 05/12/15 1639 05/12/15 2120 05/13/15 0816  GLUCAP 218* 201* 250* 163* 242* 198*     Images reviewed: Last pCXR  05/08/15 bilateral basilar atx/ massive CM    ASSESSMENT / PLAN:  Acute on chronic hypoxic respiratory failure with bilateral atx changes on cxr. Hx COPD on 5-6L home O2. Acute on chronic diastolic CHF. Acute on CKD 3. HTN. Chronic AFib on eliquis. PAH - new diagnosis this admission (PAP 67 on echo).  Significant changes in echo with severely dilated RA and PA pressure (since last echo in March 2016) certainly raises suspicion for PE given ongoing severe hypoxia requiring NRB despite diuresis and rx for AECOPD.  Not bronchospastic on exam.  However, pt on eliquis at home which would make PE much less likely.  Acute on CKD3 makes CTA more difficult and she certainly has other explanations for her respiratory failure.     PLAN: Continue gentle diuresis as Scr and BP tolerate. High flow O2 - wean as able to keep sats 88-92%. PRN bipap for increased WOB. Continue solumedrol, would drop down to QD soon as she remains without active bronchospasm. Continue pulmicort/brovana (pulmicort dose adjusted). DuoNebs in lieu of spiriva as nebs would provide better delivery than inhaler during her acute illness. Max PPI. Continue eliquis. F/u on BLE venous doppler to r/o DVT - CTA restricted due to AoCKD. Consider initiating vasodilator therapy for PAH if BP permits, attending MD to follow. Incentive spirometry / flutter valve.  Rutherford Guys, [name] [facility] Pager: [phone]  or [phone] 05/13/2015, 10:29 AM  Attending Note:  81 year old female with history of COPD and CHF who had a normal PAP in march now with PAP in the 60's with acute on chronic hypoxemic respiratory failure as well as a COPD exacerbation. DDx of acute on chronic PE vs worsening heart failure, unlikely to develop IPF at her age that quickly. I reviewed CXR myself with evidence of pulmonary edema.  Discussed with PCCM NP and bedside RN.  Acute on chronic hypoxemic respiratory failure: concern for oxidative damage to the lung at this point. - Diureses as renal function allows. - Treat COPD as ordered. - Would like to evaluate for PE but renal function precludes, regardless will be on anti-coagulation. - Titrate O2 for sat of 88-92%.  Acute worsening of PAP. - May consider vasodilators BP permitting.  - Diureses as able. - Anti-coag as ordered (eliquis).  DVT/PT: - Continue anti-coagulation. - Lower ext dopplers ordered, if positive will need an IVC filter.  COPD: - Continue spiriva. - Continue symbicort. - Solumedrol as ordered.  Patient seen  and examined, agree with above note. I dictated the care and orders written for this patient under my direction.  Alyson Reedy, MD [phone]

## 2015-05-13 NOTE — Progress Notes (Signed)
[facility] PROGRESS NOTE  [name] ZOX:[id number] DOB: 1932/02/26 DOA: 05-07-15 PCP: Nadean Corwin, MD  Assessment/Plan: 1- Acute on Chronic resp failure with hypoxia/ Acute diastolic heart failure: patient chronically on 5-6L at home; due to COPD. Presented with worsening SOB, hypoxia and increased LE swelling. Patient worsening breathing due to acute on chronic diastolic HF and mild COPD exacerbation. -will continue weaning oxygen supplementation as tolerated; patient steadily improving. She uses 5-6L at baseline. Goal is for O2 sat 88-92%. On non rebreather currently.  - Place on IV lasix regimen, monitor daily weights and intake and output. - repeat chest x ray reported congestive heart failure with mild interstitial edema - change solumedrol per pulmonary team Pt has history of CHF. Echocardiogram reporting EF of 55-60% - Consulted palliative for goals of care for planning should patient's condition not improve despite aggressive measures. - diuresis complicated due to CKD and low blood pressures.  Will increase lasix to 40 mg IV bid.   2-chronic atrial fibrillation: CHADsVASC score 6 - will continue apixaban - rate controlled - Stable  3-HLD: in patient with hx of PVD and AAA -continue statins - Stable  4-Hx of AAA: stable and follow as an outpatient -continue abd [place] checks periodically - After discharge patient should follow-up with primary care physician or vascular surgeon for continued monitoring  5-GERD: continue PPI, stable  6-type 2 diabetes: with PVD, renal manifestation and hyperglycemia -will use SSI and adjust as needed -Hyperglycemia secondary to use of steroids  7-essential HTN: stable and well controlled -Will continue to monitor  8-hx of seizures: continue keppra  9-elevated lactic acid: trending down -will continue holding metformin -resolved  10-acute on CKD stage 3:  -continue diuresis  11. Hyperkalemia -  resolved   Code Status: DNR Family Communication: no family at bedside  Disposition Plan: pending improvement in condition   Consultants:  Palliative care  PCCM   Procedures:  See below for x-ray reports   Antibiotics:  None   HPI/Subjective: Pt has no new complaints. No acute issues reported overnight.  Objective: Filed Vitals:   05/13/15 1000  BP: 102/51  Pulse: 70  Temp:   Resp: 25    Intake/Output Summary (Last 24 hours) at 05/13/15 1238 Last data filed at 05/13/15 1237  Gross per 24 hour  Intake   1440 ml  Output    500 ml  Net    940 ml   Filed Weights   05/11/15 0500 05/12/15 0500 05/13/15 0500  Weight: 74.662 kg (164 lb 9.6 oz) 77.338 kg (170 lb 8 oz) 78.699 kg (173 lb 8 oz)    Exam:   General:  Alert and awake, in NAD  Cardiovascular: positive JVD, rate controlled, no rubs or gallops  Respiratory: no wheezing, positive fine bibasilar crackles BL. No use of accessory muscles, on non rebreather  Abdomen: soft, NT, ND, positive BS  Musculoskeletal: no cyanosis, no erythema  Data Reviewed: Basic Metabolic Panel:  Recent Labs Lab 05-07-2015 1957 05/07/15 0112 05/09/15 0258 05/09/15 0430 05/09/15 1246 05/10/15 0347 05/11/15 1120  NA 143 142 139  --   --  140 141  K 3.4* 3.7 6.2* 5.8* 6.0* 5.3* 3.5  CL 98* 95* 98*  --   --  98* 94*  CO2 28 32 32  --   --  31 36*  GLUCOSE 191* 267* 191*  --   --  247* 174*  BUN 19 19 43*  --   --  46* 36*  CREATININE  1.79* 1.90* 2.08*  --   --  2.03* 1.92*  CALCIUM 9.1 9.0 8.9  --   --  9.1 8.9   Liver Function Tests:  Recent Labs Lab 2015-02-27 1957 05/07/15 011210/06/2015 70* 59*  ALT 19 20  ALKPHOS 93 88  BILITOT 2.1* 1.6*  PROT 6.2* 5.8*  ALBUMIN 3.6 3.5   CBC:  Recent Labs Lab 06/03/2015 1954 2015/06/03 1957 05/07/15 0112 05/12/15 0559  WBC  --  9.0 5.7 10.9*  HGB 13.3 10.6* 10.2* 10.1*  HCT 39.0 39.1 36.4 35.4*  MCV  --  86.7 86.5 84.1  PLT  --  145* 115* 109*   BNP (last 3  results)  Recent Labs  06/03/15 1958  BNP 1843.2*    ProBNP (last 3 results)  Recent Labs  06/02/14  PROBNP 13998.0*    CBG:  Recent Labs Lab 05/12/15 0835 05/12/15 1200 05/12/15 1639 05/12/15 2120 05/13/15 0816  GLUCAP 201* 250* 163* 242* 198*    Recent Results (from the past 240 hour(s))  MRSA PCR Screening     Status: None   Collection Time: 2015/06/03  9:47 PM  Result Value Ref Range Status   MRSA by PCR NEGATIVE NEGATIVE Final    Comment:        The GeneXpert MRSA Assay (FDA approved for NASAL specimens only), is one component of a comprehensive MRSA colonization surveillance program. It is not intended to diagnose MRSA infection nor to guide or monitor treatment for MRSA infections.      Studies: Dg Chest Port 1 View  05/11/2015   CLINICAL DATA:  Shortness of breath  EXAM: PORTABLE CHEST - 1 VIEW  COMPARISON:  05/08/2015  FINDINGS: Mild to moderate cardiac enlargement. Mild vascular congestion. Mild diffuse interstitial change with Kerley B-lines noted. Retrocardiac area not well evaluated. No change 2 lead cardiac pacer.  IMPRESSION: Congestive heart failure with mild interstitial edema.   Electronically Signed   By: Esperanza Heir M.D.   On: 05/11/2015 14:21    Scheduled Meds: . apixaban  2.5 mg Oral BID  . arformoterol  15 mcg Nebulization BID  . aspirin EC  81 mg Oral Daily  . atorvastatin  40 mg Oral Q M,W,F  . budesonide  0.5 mg Nebulization BID  . furosemide  40 mg Intravenous BID  . insulin aspart  0-15 Units Subcutaneous TID WC  . ipratropium-albuterol  3 mL Nebulization Q6H  . levETIRAcetam  500 mg Oral BID  . methylPREDNISolone (SOLU-MEDROL) injection  40 mg Intravenous Q12H  . montelukast  10 mg Oral QHS  . pantoprazole  40 mg Oral BID AC  . PARoxetine  10 mg Oral Daily   Continuous Infusions:   Principal Problem:   Acute on chronic respiratory failure with hypoxia Active Problems:   Hypertension   COPD (chronic obstructive  pulmonary disease)   T2_NIDDM w/CKD   Chronic atrial fibrillation   COPD, severe   Chronic renal failure, stage 3 (moderate)   Acute on chronic combined systolic and diastolic congestive heart failure   Acute and chronic respiratory failure with hypoxia   Hyperbilirubinemia   Elevated lactic acid level    Time spent: 35 minutes    Penny Pia  [facility] Pager [phone]. If 7PM-7AM, please contact night-coverage at [email or web address], password [facility] 05/13/2015, 12:38 PM  LOS: 7 days

## 2015-05-13 NOTE — Progress Notes (Signed)
RT attempted pt with nasal cannula on 5 Lpm. Pt desat to 78%. Pt returned to PRB mask at 15 Lpm.

## 2015-05-13 NOTE — Progress Notes (Signed)
Inpatient Diabetes Program Recommendations  AACE/ADA: New Consensus Statement on Inpatient Glycemic Control (2015)  Target Ranges:  Prepandial:   less than 140 mg/dL      Peak postprandial:   less than 180 mg/dL (1-2 hours)      Critically ill patients:  140 - 180 mg/dL    Results for [name], [name] (MRN [id number]) as of 05/13/2015 08:18  Ref. Range 05/12/2015 08:35 05/12/2015 12:00 05/12/2015 16:39 05/12/2015 21:20  Glucose-Capillary Latest Ref Range: 65-99 mg/dL 562 (H) 130 (H) 865 (H) 242 (H)     Current DM Orders: Novolog Moderate SSI (0-15 units) TID AC    -Patient currently getting IV Solumedrol 40 mg Q12 hours.    -Glucose levels consistently elevated >200 mg/dl.    MD- Please consider the following in-hospital insulin adjustments while patient getting IV Steroids:  1. Start low dose basal insulin- Levemir 8 units daily (0.1 units/kg dosing)  2. Start low dose Novolog Meal Coverage- Novolog 3 units tid with meals     Will follow Ambrose Finland RN, MSN, CDE Diabetes Coordinator Inpatient Glycemic Control Team Team Pager: [phone] (8a-5p)

## 2015-05-14 DIAGNOSIS — Z515 Encounter for palliative care: Secondary | ICD-10-CM

## 2015-05-14 DIAGNOSIS — R0602 Shortness of breath: Secondary | ICD-10-CM | POA: Insufficient documentation

## 2015-05-14 DIAGNOSIS — R0600 Dyspnea, unspecified: Secondary | ICD-10-CM

## 2015-05-14 LAB — GLUCOSE, CAPILLARY
GLUCOSE-CAPILLARY: 313 mg/dL — AB (ref 65–99)
GLUCOSE-CAPILLARY: 262 mg/dL — AB (ref 65–99)
GLUCOSE-CAPILLARY: 146 mg/dL — AB (ref 65–99)
GLUCOSE-CAPILLARY: 219 mg/dL — AB (ref 65–99)

## 2015-05-14 LAB — BASIC METABOLIC PANEL
ANION GAP: 10 (ref 5–15)
BUN: 49 mg/dL — ABNORMAL HIGH (ref 6–20)
CHLORIDE: 94 mmol/L — AB (ref 101–111)
CO2: 34 mmol/L — AB (ref 22–32)
CREATININE: 1.93 mg/dL — AB (ref 0.44–1.00)
Calcium: 8.7 mg/dL — ABNORMAL LOW (ref 8.9–10.3)
GFR calc non Af Amer: 23 mL/min — ABNORMAL LOW (ref >60)
GFR, EST AFRICAN AMERICAN: 27 mL/min — AB (ref >60)
Glucose, Bld: 308 mg/dL — ABNORMAL HIGH (ref 65–99)
Potassium: 4.3 mmol/L (ref 3.5–5.1)
SODIUM: 138 mmol/L (ref 135–145)

## 2015-05-14 MED ORDER — MORPHINE SULFATE (CONCENTRATE) 10 MG/0.5ML PO SOLN
5.0000 mg | ORAL | Status: DC | PRN
  Administered 2015-05-16: 5 mg via ORAL
  Filled 2015-05-14: qty 0.5

## 2015-05-14 MED ORDER — FUROSEMIDE 10 MG/ML IJ SOLN
80.0000 mg | Freq: Two times a day (BID) | INTRAMUSCULAR | Status: DC
  Administered 2015-05-14 (×2): 80 mg via INTRAVENOUS
  Filled 2015-05-14 (×2): qty 8

## 2015-05-14 MED ORDER — INSULIN GLARGINE 100 UNIT/ML ~~LOC~~ SOLN
10.0000 [IU] | Freq: Every day | SUBCUTANEOUS | Status: DC
  Administered 2015-05-14 – 2015-05-16 (×3): 10 [IU] via SUBCUTANEOUS
  Filled 2015-05-14 (×3): qty 0.1

## 2015-05-14 MED ORDER — LORAZEPAM 0.5 MG PO TABS: 0.5000 mg | ORAL_TABLET | ORAL | Status: DC | PRN

## 2015-05-14 NOTE — Progress Notes (Addendum)
Results for AILIN, ROCHFORD (MRN [id number]) as of 05/14/2015 11:22  Ref. Range 05/13/2015 08:16 05/13/2015 12:58 05/13/2015 17:22 05/13/2015 21:29 05/14/2015 08:07  Glucose-Capillary Latest Ref Range: 65-99 mg/dL 102 (H) 725 (H) 366 (H) 260 (H) 313 (H)  CBGs continue to be greater than 180 mg/dl. Recommend increasing Lantus 15-20 units every HS if CBGs continue to be greater than 180 mg/dl and while on steroids.  Will continue to follow while in hospital. Smith Mince RN BSN CDE

## 2015-05-14 NOTE — Care Management Important Message (Signed)
Important Message  Patient Details  Name: [name] MRN: [id number] Date of Birth: 10-06-1931   Medicare Important Message Given:  Yes-third notification given    Bernadette Hoit 05/14/2015, 10:52 AM

## 2015-05-14 NOTE — Consult Note (Signed)
Consultation Note Date: 05/14/2015   Patient Name: [name]  DOB: 1931-09-18  MRN: [id number]  Age / Sex: 81 y.o., female   PCP: Lucky Cowboy, MD Referring Physician: Zannie Cove, MD  Reason for Consultation: Establishing goals of care, Non pain symptom management, Pain control and Psychosocial/spiritual support  Palliative Care Assessment and Plan Summary of Established Goals of Care and Medical Treatment Preferences    Palliative Care Discussion Held Today:    This NP Lorinda Creed reviewed medical records, received report from team, assessed the patient and then meet at the patient's bedside along with her daughter Tarry Kos and two other siblings to discuss diagnosis, prognosis, GOC, EOL wishes disposition and options.  A detailed discussion was had today regarding advanced directives.  Concepts specific to code status, artifical feeding and hydration, continued IV antibiotics and rehospitalization was had.  The difference between a aggressive medical intervention path  and a palliative comfort care path for this patient at this time was had.  Values and goals of care important to patient and family were attempted to be elicited.  Concept of Hospice and Palliative Care were discussed  Natural trajectory and expectations at EOL were discussed.  Questions and concerns addressed.  Hard Choices booklet left for review. Family encouraged to call with questions or concerns.  PMT will continue to support holistically.  Family understands the poor prognosis, discussed consideration of hospice facility but family wants patient to go home.  We will need to create care plan that enables patient to meet her wish to go home.     Contacts/Participants in Discussion: Three children and neice Primary Decision Maker: [name]   Goals of Care/Code Status/Advance Care Planning:  Comfort and dignity at home with assistance of hospice benefit, no escalation of care.   Code Status:  DNR/DNI-comfort is focus of care  BiPap: no further utilization, treat symptoms with prn medications  Medications: continue current medications as long as patient is able to swallow  Artificial feeding: not now or in the future, comfort feeds as tolerated   Rehospitalization: avoid   Symptom Management:     Dyspnea:  Continue on current level of oxygen (Case management to check with home hospice regarding O2 needs), consider an oxymizer.  Utilize prn Roxanol for symptom management    Psycho-social/Spiritual:    Of note, the patient's sister died one week ago on Hospice for end-stage CHF.   Support System: family  Desire for further Chaplaincy support:yes  Prognosis: < 2 weeks  Discharge Planning:  Home with Hospice.   No escalation of care, continue current mediations and level of O2 administration until discharge home.  At that time work with hospice to formulate home care plan.       Chief Complaint: increased BOB  History of Present Illness:    81 y.o. female with a past medical history significant for COPD on 5L home O2 and daily prednisone 10 mg, chronic combined systolic and diastolic heart failure, atrial fibrillation on anticoagulation, pacemaker, NIDDM, hypertension, chronic anemia and CKD stage III-IV who presents with hypoxia and elevated lactic acid.  The patient is accompanied by her daughter who provides most history as the patient is on Bipap. They went to the patient's PCP this afternoon for a routine physical, waited for 45 minutes with her portable oxygen (she takes 5L at baseline and usually has home SpO2 ~85-88% per daughter). When she got into the examination room, she was cold needing blankets, her fingers were blue and her  SpO2 was in the 70s, so EMS was called. They gave nebs and brought her to the ER, where her SpO2 increased to the mid to upper 80s. The patient and her daughter denied new cough, worsening sputum. They note increased leg swelling  over the last week and decreased energy over the course of days to weeks. In the ED, the patient was able to oxygenate in the low to mid-80s on aeromask. She got additional bronchodilators and IV steroid and was prepared for admission.  Per family continued physical, and functional decline    Primary Diagnoses  Present on Admission:  . Acute on chronic respiratory failure with hypoxia . COPD (chronic obstructive pulmonary disease) . Hypertension . T2_NIDDM w/CKD . Chronic renal failure, stage 3 (moderate) . COPD, severe . Chronic atrial fibrillation . Acute on chronic combined systolic and diastolic congestive heart failure . Acute and chronic respiratory failure with hypoxia . Hyperbilirubinemia . Elevated lactic acid level  Palliative Review of Systems:    - weakness, fatigue, dyspnea   I have reviewed the medical record, interviewed the patient and family, and examined the patient. The following aspects are pertinent.  Past Medical History  Diagnosis Date  . AV block, complete   . CHF (congestive heart failure)   . AAA (abdominal aortic aneurysm)   . Dyslipidemia   . History of colonic polyps   . Anxiety and depression   . Diverticulosis of colon   . Asthma   . Acute renal failure   . Atrial flutter   . Hyperglycemia   . UTI (lower urinary tract infection)   . COPD (chronic obstructive pulmonary disease)   . HTN (hypertension)   . DM (diabetes mellitus)   . GERD (gastroesophageal reflux disease)   . DVT (deep venous thrombosis)   . B12 deficiency   . Angiodysplasia of intestine (without mention of hemorrhage)    Social History   Social History  . Marital Status: Widowed    Spouse Name: N/A  . Number of Children: N/A  . Years of Education: N/A   Social History Main Topics  . Smoking status: Former Smoker    Start date: 11/30/1953    Quit date: 08/23/1988  . Smokeless tobacco: Never Used     Comment: quit in 1990, smoked 1 ppd x 30 years   . Alcohol  Use: No  . Drug Use: No  . Sexual Activity: Not Asked   Other Topics Concern  . None   Social History Narrative   Married, 4 children; retired Associate Professor.    Family History  Problem Relation Age of Onset  . Emphysema Maternal Uncle     multiple   . Asthma Daughter   . Colon cancer Sister     alive at 26   . Heart disease Father   . CVA Mother   . Heart disease Mother   . Kidney failure Sister   . Heart disease Sister    Scheduled Meds: . apixaban  2.5 mg Oral BID  . arformoterol  15 mcg Nebulization BID  . aspirin EC  81 mg Oral Daily  . atorvastatin  40 mg Oral Q M,W,F  . budesonide  0.5 mg Nebulization BID  . furosemide  80 mg Intravenous BID  . insulin aspart  0-15 Units Subcutaneous TID WC  . insulin glargine  10 Units Subcutaneous Daily  . ipratropium-albuterol  3 mL Nebulization Q6H  . levETIRAcetam  500 mg Oral BID  . methylPREDNISolone (SOLU-MEDROL) injection  40  mg Intravenous Q12H  . montelukast  10 mg Oral QHS  . pantoprazole  40 mg Oral BID AC  . PARoxetine  10 mg Oral Daily   Continuous Infusions:  PRN Meds:.albuterol Medications Prior to Admission:  Prior to Admission medications   Medication Sig Start Date End Date Taking? Authorizing Provider  apixaban (ELIQUIS) 5 MG TABS tablet Take 0.5 tablets (2.5 mg total) by mouth 2 (two) times daily. 11/12/14  Yes Drema Dallas, MD  aspirin 81 MG tablet Take 81 mg by mouth daily.   Yes Historical Provider, MD  Cholecalciferol (VITAMIN D) 2000 UNITS tablet Take 5,000 Units by mouth daily.    Yes Historical Provider, MD  dexlansoprazole (DEXILANT) 60 MG capsule Take 1 capsule (60 mg total) by mouth daily. 04/25/15  Yes Lucky Cowboy, MD  furosemide (LASIX) 40 MG tablet Take 40 mg by mouth daily.  01/30/15  Yes Historical Provider, MD  ipratropium (ATROVENT) 0.06 % nasal spray Place 2 sprays into the nose 2 (two) times daily. 12/20/14  Yes Lucky Cowboy, MD  levalbuterol [facility] HFA) 45 MCG/ACT inhaler Take 1 -  2 puffs -  5 minutes apart  - 4 x day or every 4 hours as needed to rescue Asthma Patient taking differently: Inhale 1 puff into the lungs See admin instructions. Take 1 - 2 puffs -  5 minutes apart  - 4 x day or every 4 hours as needed to rescue Asthma 12/05/14 12/06/15 Yes Lucky Cowboy, MD  levalbuterol [facility]) 1.25 MG/3ML nebulizer solution 1 neb treatment 4 x day Patient taking differently: Take 1.25 mg by nebulization every 6 (six) hours as needed for shortness of breath. 1 neb treatment 4 x day 12/05/14 12/06/15 Yes Lucky Cowboy, MD  levETIRAcetam (KEPPRA) 500 MG tablet Take 1 tablet (500 mg total) by mouth 2 (two) times daily. 12/05/14  Yes Lucky Cowboy, MD  linagliptin (TRADJENTA) 5 MG TABS tablet Take 1 tablet (5 mg total) by mouth daily. 06/07/14  Yes Russella Dar, NP  metFORMIN (GLUCOPHAGE-XR) 500 MG 24 hr tablet Take 500 mg by mouth daily.   Yes Historical Provider, MD  montelukast (SINGULAIR) 10 MG tablet Take 1 tablet (10 mg total) by mouth at bedtime. 02/17/15  Yes Lucky Cowboy, MD  PARoxetine (PAXIL) 20 MG tablet Take 10 mg by mouth daily.   Yes Historical Provider, MD  predniSONE (DELTASONE) 10 MG tablet Take 10 mg by mouth daily with breakfast.   Yes Historical Provider, MD  simvastatin (ZOCOR) 80 MG tablet Take 40 mg by mouth See admin instructions. Only take on Mon, Wed, and Fridays per daughter   Yes Historical Provider, MD  tiotropium (SPIRIVA) 18 MCG inhalation capsule Place 18 mcg into inhaler and inhale daily.   Yes Historical Provider, MD  ONE TOUCH ULTRA TEST test strip USE TO CHECK BLOOD GLUCOSE ONCE A DAY AS INSTRUCTED 05/19/14   Lucky Cowboy, MD   Allergies  Allergen Reactions  . Advair Diskus [Fluticasone-Salmeterol]     Itching  . Biaxin [Clarithromycin]     N/V  . Ciprofloxacin     N/V  . Doxycycline Other (See Comments)    Inside of mouth and tongue red and peeled  . Keflex [Cephalexin]     N/V  . Ketek [Telithromycin]     N/V  . Levaquin  [Levofloxacin In D5w] Other (See Comments)    Arrythmia, polymorphic Vtach on 10/15 hospitalization  . Macrobid [Nitrofurantoin]     N/V  . Penicillins  Rash  . Sulfonamide Derivatives     unknown   CBC:    Component Value Date/Time   WBC 10.9* 05/12/2015 0559   HGB 10.1* 05/12/2015 0559   HCT 35.4* 05/12/2015 0559   PLT 109* 05/12/2015 0559   MCV 84.1 05/12/2015 0559   NEUTROABS 10.2* 12/26/2014 1542   LYMPHSABS 0.6* 12/26/2014 1542   MONOABS 0.6 12/26/2014 1542   EOSABS 0.1 12/26/2014 1542   BASOSABS 0.0 12/26/2014 1542   Comprehensive Metabolic Panel:    Component Value Date/Time   NA 138 05/14/2015 0018   K 4.3 05/14/2015 0018   CL 94* 05/14/2015 0018   CO2 34* 05/14/2015 0018   BUN 49* 05/14/2015 0018   CREATININE 1.93* 05/14/2015 0018   CREATININE 1.29* 12/26/2014 1542   GLUCOSE 308* 05/14/2015 0018   CALCIUM 8.7* 05/14/2015 0018   AST 59* 05/07/2015 0112   ALT 20 05/07/2015 0112   ALKPHOS 88 05/07/2015 0112   BILITOT 1.6* 05/07/2015 0112   PROT 5.8* 05/07/2015 0112   ALBUMIN 3.5 05/07/2015 0112    Physical Exam:  Vital Signs: BP 145/64 mmHg  Pulse 74  Temp(Src) 97.3 F (36.3 C) (Axillary)  Resp 22  Ht  (1.626 m)  Wt 77.066 kg (169 lb 14.4 oz)  BMI 29.15 kg/m2  SpO2 92% SpO2: SpO2: 92 % O2 Device: O2 Device: NRB O2 Flow Rate: O2 Flow Rate (L/min): 15 L/min Intake/output summary:  Intake/Output Summary (Last 24 hours) at 05/14/15 1124 Last data filed at 05/14/15 0851  Gross per 24 hour  Intake    840 ml  Output   1225 ml  Net   -385 ml   LBM: Last BM Date: 05/13/15 Baseline Weight: Weight: 79.8 kg (175 lb 14.8 oz) Most recent weight: Weight: 77.066 kg (169 lb 14.4 oz)  Exam Findings:   General: ill appearing, OOB to chair HEENT: dry buccal membranes, no exudate CVS: RRR Resp: high volume O2 needs/face mask  Abd: soft NT +BS Neuro: lethargic  Variable  0 Points 1 Point 2 Points Total  Heart rate per minute  <90 beats 90-109  beats >110 beats 0  Respiratory  Rate per minute < 18 breaths 19-30 breaths  >30 breaths 1  Restlessness; nonpurposeful movements None  occas slight movement Frequent movement 0  Paradoxical breathing pattern: None  Present 0  Accessory muscle use: rise in clavicle during inspiration None Slight rise Pronounced rise 1  Grunting at end-expiration: guttural sound None  Present 0  Nasal flaring: involuntary movement of nares None  Present 0  Look of fear None  Eyes wide 0  Overall total out of 16    2             Palliative Performance Scale: 30  %                Additional Data Reviewed: Recent Labs     05/12/15  0559  05/14/15  0018  WBC  10.9*   --   HGB  10.1*   --   PLT  109*   --   NA   --   138  BUN   --   49*  CREATININE   --   1.93*     Time In: 1130 Time Out: 1300 Time Total: 90 min  Greater than 50%  of this time was spent counseling and coordinating care related to the above assessment and plan.  Discussed with  Dr  Jomarie Longs and [facility] CMRN  Signed  by: [facility], Corrie Dandy, NP  Canary Brim, NP  05/14/2015, 11:24 AM  Please contact Palliative Medicine Team phone at [phone] for questions and concerns.   See AMION for contact information

## 2015-05-14 NOTE — Progress Notes (Signed)
ANTICOAGULATION CONSULT NOTE - Initial Consult  Pharmacy Consult for apixaban Indication: afib and VTE rx  Allergies  Allergen Reactions  . Advair Diskus [Fluticasone-Salmeterol]     Itching  . Biaxin [Clarithromycin]     N/V  . Ciprofloxacin     N/V  . Doxycycline Other (See Comments)    Inside of mouth and tongue red and peeled  . Keflex [Cephalexin]     N/V  . Ketek [Telithromycin]     N/V  . Levaquin [Levofloxacin In D5w] Other (See Comments)    Arrythmia, polymorphic Vtach on 10/15 hospitalization  . Macrobid [Nitrofurantoin]     N/V  . Penicillins     Rash  . Sulfonamide Derivatives     unknown    Patient Measurements: Height:  (162.6 cm) Weight: 169 lb 14.4 oz (77.066 kg) IBW/kg (Calculated) : 54.7  Vital Signs: Temp: 97.8 F (36.6 C) (09/21 0400) Temp Source: Oral (09/21 0400) BP: 131/65 mmHg (09/21 0630) Pulse Rate: 72 (09/21 0630)  Labs:  Recent Labs  05/11/15 1120 05/12/15 0559 05/14/15 0018  HGB  --  10.1*  --   HCT  --  35.4*  --   PLT  --  109*  --   CREATININE 1.92*  --  1.93*    Estimated Creatinine Clearance: 23 mL/min (by C-G formula based on Cr of 1.93).   Medical History: Past Medical History  Diagnosis Date  . AV block, complete   . CHF (congestive heart failure)   . AAA (abdominal aortic aneurysm)   . Dyslipidemia   . History of colonic polyps   . Anxiety and depression   . Diverticulosis of colon   . Asthma   . Acute renal failure   . Atrial flutter   . Hyperglycemia   . UTI (lower urinary tract infection)   . COPD (chronic obstructive pulmonary disease)   . HTN (hypertension)   . DM (diabetes mellitus)   . GERD (gastroesophageal reflux disease)   . DVT (deep venous thrombosis)   . B12 deficiency   . Angiodysplasia of intestine (without mention of hemorrhage)     Medications:  Prescriptions prior to admission  Medication Sig Dispense Refill Last Dose  . apixaban (ELIQUIS) 5 MG TABS tablet Take 0.5 tablets  (2.5 mg total) by mouth 2 (two) times daily. 60 tablet  05-14-2015 at Unknown time  . aspirin 81 MG tablet Take 81 mg by mouth daily.   Past Week at Unknown time  . Cholecalciferol (VITAMIN D) 2000 UNITS tablet Take 5,000 Units by mouth daily.    Past Week at Unknown time  . dexlansoprazole (DEXILANT) 60 MG capsule Take 1 capsule (60 mg total) by mouth daily. 30 capsule 1 2015-05-14 at Unknown time  . furosemide (LASIX) 40 MG tablet Take 40 mg by mouth daily.    05-14-2015 at Unknown time  . ipratropium (ATROVENT) 0.06 % nasal spray Place 2 sprays into the nose 2 (two) times daily. 15 mL 2 05-14-15 at Unknown time  . levalbuterol (XOPENEX HFA) 45 MCG/ACT inhaler Take 1 - 2 puffs -  5 minutes apart  - 4 x day or every 4 hours as needed to rescue Asthma (Patient taking differently: Inhale 1 puff into the lungs See admin instructions. Take 1 - 2 puffs -  5 minutes apart  - 4 x day or every 4 hours as needed to rescue Asthma) 1 Inhaler 99 05-14-15 at Unknown time  . levalbuterol (XOPENEX) 1.25 MG/3ML nebulizer solution 1  neb treatment 4 x day (Patient taking differently: Take 1.25 mg by nebulization every 6 (six) hours as needed for shortness of breath. 1 neb treatment 4 x day) 360 mL 12 Past Week at Unknown time  . levETIRAcetam (KEPPRA) 500 MG tablet Take 1 tablet (500 mg total) by mouth 2 (two) times daily. 60 tablet 5 05/08/2015 at Unknown time  . linagliptin (TRADJENTA) 5 MG TABS tablet Take 1 tablet (5 mg total) by mouth daily. 30 tablet 0 05/12/2015 at Unknown time  . metFORMIN (GLUCOPHAGE-XR) 500 MG 24 hr tablet Take 500 mg by mouth daily.   05/05/2015 at Unknown time  . montelukast (SINGULAIR) 10 MG tablet Take 1 tablet (10 mg total) by mouth at bedtime. 30 tablet prn 04/26/2015 at Unknown time  . PARoxetine (PAXIL) 20 MG tablet Take 10 mg by mouth daily.   05/07/2015 at Unknown time  . predniSONE (DELTASONE) 10 MG tablet Take 10 mg by mouth daily with breakfast.   05/04/2015 at Unknown time  .  simvastatin (ZOCOR) 80 MG tablet Take 40 mg by mouth See admin instructions. Only take on Mon, Wed, and Fridays per daughter   Past Week at Unknown time  . tiotropium (SPIRIVA) 18 MCG inhalation capsule Place 18 mcg into inhaler and inhale daily.   Past Week at Unknown time  . ONE TOUCH ULTRA TEST test strip USE TO CHECK BLOOD GLUCOSE ONCE A DAY AS INSTRUCTED 100 each 99 Taking  . [DISCONTINUED] predniSONE (DELTASONE) 10 MG tablet TAKE 1 TABLET BY MOUTH ONCE A DAY TO THREE TIMES A DAY OR AS INSTRUCTED (Patient not taking: Reported on 05/10/2015) 100 tablet 1 Not Taking at Unknown time    Assessment: 81 yo F on apixaban 2.5mg  bid pta for afib. Dose appropriate (age>80, SCr>1.5). Hg stable 10.2, plt decrease to 109 (last 9/14). No bleed documented 9/21 consulted to change from afib to vte rx dose Doppler: chronic/age inderminate DVT L common femoral VQ scan neg for PE VTE rx  Dose is: 10 bid x 7 day f/u by 5 bid, then after 6 months 2.5 bid to prevent recurrence; creat 1.93, age 32, wt 77 kg and VTE is chronic, VQ neg for PE.   I called and d/w Dr Jomarie Longs and rec to continue apixa 2.5 bid for prevention of recurrence of vte and afib rx in pt > 80 and with creat > 1.5 and she agreed.  Goal of Therapy: stroke prevention and prevention of recurrence of VTE   Plan:  Continue apixaban 2.5 mg po BID  Herby Abraham, Pharm.D. [phone] 05/14/2015 7:57 AM

## 2015-05-14 NOTE — Progress Notes (Addendum)
[facility] PROGRESS NOTE  [name] ZOX:[id number] DOB: 01/21/1932 DOA: 06/03/2015 PCP: Nadean Corwin, MD  Assessment/Plan: 1- Acute on Chronic resp failure with hypoxia/ Acute diastolic heart failure:  -patient chronically on 5-6L at home; due to COPD -felt to be due to acute on chronic diastolic HF and mild COPD exacerbation. -currently on Non rebreather mask 100% O2 -Increase lasix to  Q12, EF 55% -follow I/Os, weights Also being Rx for COPD flare with Solumedrol, nebs  -Dopplers with age indeterminate vs chronic LLE DVT, on Apixaban for Afib, will ask Pharmacy to change dose for VTE Rx dose, VQ scan on 9/15 with low prob of PE - Consulted palliative for goals of care -PCCM consulted for following   2-chronic atrial fibrillation: CHADsVASC score 6 - will continue apixaban - rate controlled - Stable  3 CKD 4 -monitor with diuresis  4-Hx of AAA: stable and follow as an outpatient  5-GERD: continue PPI, stable  6-type 2 diabetes: with PVD, renal manifestation and hyperglycemia -Hyperglycemia secondary to use of steroids, start lantus  7-essential HTN: stable and well controlled -Will continue to monitor  8-hx of seizures: continue keppra  9-elevated lactic acid: trending down -will continue holding metformin -resolved  10-acute on CKD stage 3:  -continue diuresis  11. Hyperkalemia - resolved  Code Status: DNR Family Communication: no family at bedside, will call daughter Disposition Plan: Keep in SDU  Consultants:  Palliative care  PCCM   Procedures:  See below for x-ray reports   Antibiotics:  None   HPI/Subjective:   Objective: Filed Vitals:   05/14/15 0400  BP: 131/65  Pulse: 70  Temp: 97.8 F (36.6 C)  Resp: 23    Intake/Output Summary (Last 24 hours) at 05/14/15 0724 Last data filed at 05/14/15 0600  Gross per 24 hour  Intake   1080 ml  Output   1225 ml  Net   -145 ml   Filed Weights   05/12/15 0500  05/13/15 0500 05/14/15 0500  Weight: 77.338 kg (170 lb 8 oz) 78.699 kg (173 lb 8 oz) 77.066 kg (169 lb 14.4 oz)    Exam:   General:  Alert and awake, in NAD  Cardiovascular: positive JVD, rate controlled, no rubs or gallops  Respiratory: no wheezing, positive fine bibasilar crackles BL. No use of accessory muscles, on non rebreather  Abdomen: soft, NT, ND, positive BS  Musculoskeletal: no cyanosis, no erythema  Data Reviewed: Basic Metabolic Panel:  Recent Labs Lab 05/09/15 0258 05/09/15 0430 05/09/15 1246 05/10/15 0347 05/11/15 1120 05/14/15 0018  NA 139  --   --  140 141 138  K 6.2* 5.8* 6.0* 5.3* 3.5 4.3  CL 98*  --   --  98* 94* 94*  CO2 32  --   --  31 36* 34*  GLUCOSE 191*  --   --  247* 174* 308*  BUN 43*  --   --  46* 36* 49*  CREATININE 2.08*  --   --  2.03* 1.92* 1.93*  CALCIUM 8.9  --   --  9.1 8.9 8.7*   Liver Function Tests: No results for input(s): AST, ALT, ALKPHOS, BILITOT, PROT, ALBUMIN in the last 168 hours. CBC:  Recent Labs Lab 05/12/15 0559  WBC 10.9*  HGB 10.1*  HCT 35.4*  MCV 84.1  PLT 109*   BNP (last 3 results)  Recent Labs  June 03, 2015 1958  BNP 1843.2*    ProBNP (last 3 results)  Recent Labs  06/02/14  PROBNP  13998.0*    CBG:  Recent Labs Lab 05/12/15 2120 05/13/15 0816 05/13/15 1258 05/13/15 1722 05/13/15 2129  GLUCAP 242* 198* 260* 141* 260*    Recent Results (from the past 240 hour(s))  MRSA PCR Screening     Status: None   Collection Time: 05/21/2015  9:47 PM  Result Value Ref Range Status   MRSA by PCR NEGATIVE NEGATIVE Final    Comment:        The GeneXpert MRSA Assay (FDA approved for NASAL specimens only), is one component of a comprehensive MRSA colonization surveillance program. It is not intended to diagnose MRSA infection nor to guide or monitor treatment for MRSA infections.      Studies: No results found.  Scheduled Meds: . apixaban  2.5 mg Oral BID  . arformoterol  15 mcg  Nebulization BID  . aspirin EC  81 mg Oral Daily  . atorvastatin  40 mg Oral Q M,W,F  . budesonide  0.5 mg Nebulization BID  . furosemide  40 mg Intravenous BID  . insulin aspart  0-15 Units Subcutaneous TID WC  . ipratropium-albuterol  3 mL Nebulization Q6H  . levETIRAcetam  500 mg Oral BID  . methylPREDNISolone (SOLU-MEDROL) injection  40 mg Intravenous Q12H  . montelukast  10 mg Oral QHS  . pantoprazole  40 mg Oral BID AC  . PARoxetine  10 mg Oral Daily   Continuous Infusions:   Principal Problem:   Acute on chronic respiratory failure with hypoxia Active Problems:   Hypertension   COPD (chronic obstructive pulmonary disease)   T2_NIDDM w/CKD   Chronic atrial fibrillation   COPD, severe   Chronic renal failure, stage 3 (moderate)   Acute on chronic combined systolic and diastolic congestive heart failure   Acute and chronic respiratory failure with hypoxia   Hyperbilirubinemia   Elevated lactic acid level   Hypoxia    Time spent: 35 minutes    [facility]  [facility] Pager [phone]. If 7PM-7AM, please contact night-coverage at [email or web address], password [email or web address] 05/14/2015, 7:24 AM  LOS: 8 days

## 2015-05-14 NOTE — Care Management Note (Signed)
Case Management Note  Patient Details  Name: [name] MRN: [id number] Date of Birth: [birth year]  Subjective/Objective:    Pt to discharge home with hospice services.  Provided list of agencies to pt and dtrs, referral made to [facility].  Documentation faxed.  Kennedy Meadows, SW @ [facility] will contact dtr to determine equipment needs.                            Expected Discharge Plan:  Home w Hospice Care  Discharge planning Services  CM Consult  Post Acute Care Choice:  Hospice Choice offered to:  Patient, Adult Children  HH Arranged:  RN, Social Work [employer] Agency:  [facility]  Status of Service:  In process, will continue to follow  Medicare Important Message Given:  Yes-third notification given  Magdalene River, RN 05/14/2015, 3:07 PM

## 2015-05-15 LAB — BASIC METABOLIC PANEL
Anion gap: 10 (ref 5–15)
BUN: 45 mg/dL — AB (ref 6–20)
CHLORIDE: 92 mmol/L — AB (ref 101–111)
CO2: 37 mmol/L — AB (ref 22–32)
CREATININE: 1.72 mg/dL — AB (ref 0.44–1.00)
Calcium: 8.7 mg/dL — ABNORMAL LOW (ref 8.9–10.3)
GFR calc Af Amer: 31 mL/min — ABNORMAL LOW (ref >60)
GFR calc non Af Amer: 27 mL/min — ABNORMAL LOW (ref >60)
GLUCOSE: 252 mg/dL — AB (ref 65–99)
POTASSIUM: 4.1 mmol/L (ref 3.5–5.1)
SODIUM: 139 mmol/L (ref 135–145)

## 2015-05-15 LAB — CBC
HEMATOCRIT: 35 % — AB (ref 36.0–46.0)
HEMOGLOBIN: 10.4 g/dL — AB (ref 12.0–15.0)
MCH: 24 pg — ABNORMAL LOW (ref 26.0–34.0)
MCHC: 29.7 g/dL — ABNORMAL LOW (ref 30.0–36.0)
MCV: 80.6 fL (ref 78.0–100.0)
Platelets: 127 10*3/uL — ABNORMAL LOW (ref 150–400)
RBC: 4.34 MIL/uL (ref 3.87–5.11)
RDW: 16.1 % — ABNORMAL HIGH (ref 11.5–15.5)
WBC: 11.5 10*3/uL — AB (ref 4.0–10.5)

## 2015-05-15 LAB — GLUCOSE, CAPILLARY
GLUCOSE-CAPILLARY: 174 mg/dL — AB (ref 65–99)
GLUCOSE-CAPILLARY: 160 mg/dL — AB (ref 65–99)
Glucose-Capillary: 233 mg/dL — ABNORMAL HIGH (ref 65–99)
Glucose-Capillary: 350 mg/dL — ABNORMAL HIGH (ref 65–99)

## 2015-05-15 LAB — MAGNESIUM: MAGNESIUM: 2.1 mg/dL (ref 1.7–2.4)

## 2015-05-15 MED ORDER — AMLODIPINE BESYLATE 5 MG PO TABS
5.0000 mg | ORAL_TABLET | Freq: Every day | ORAL | Status: DC
  Administered 2015-05-15: 5 mg via ORAL
  Filled 2015-05-15 (×2): qty 1

## 2015-05-15 MED ORDER — FUROSEMIDE 10 MG/ML IJ SOLN
80.0000 mg | Freq: Three times a day (TID) | INTRAMUSCULAR | Status: DC
  Administered 2015-05-15 – 2015-05-16 (×4): 80 mg via INTRAVENOUS
  Filled 2015-05-15 (×4): qty 8

## 2015-05-15 MED ORDER — PREDNISONE 50 MG PO TABS
50.0000 mg | ORAL_TABLET | Freq: Every day | ORAL | Status: DC
  Administered 2015-05-15: 50 mg via ORAL
  Filled 2015-05-15 (×2): qty 1

## 2015-05-15 NOTE — Progress Notes (Signed)
Tried pt on 55 % ventimask sats dropped down into 70's placed back on Partial nonrebreather receiving 70 % 02. Sats climbed slowly to 88 to 90 % No distress noted .

## 2015-05-15 NOTE — Progress Notes (Signed)
[facility] PROGRESS NOTE  [name] JXB:[id number] DOB: Nov 05, 1931 DOA: 05/18/2015 PCP: Nadean Corwin, MD  Assessment/Plan: 1- Acute on Chronic resp failure with hypoxia/ Acute diastolic heart failure:  -patient chronically on 5-6L at home; due to COPD -felt to be due to acute on chronic diastolic HF and COPD exacerbation. -currently on Non rebreather mask 100% O2 -Increase lasix to  Q8, EF 55% -follow I/Os, weights -not wheezing at this time, change solumedrol to Prednisone, duonebs  -Dopplers with age indeterminate vs chronic LLE DVT, on Apixaban for Afib, per Pharmacy continue same dose, VQ scan on 9/15 with low prob of PE -Palliative consulted and following, appreciate Skeet Simmer consult, plan for Home with Hospice tomorrow -PCCM was consulted -unfortunately still requiring Partial Non re breather mask, unable to wean down from this  2-chronic atrial fibrillation: CHADsVASC score 6 - will continue apixaban - rate controlled, stable  3 CKD 4 -monitor with diuresis  4-Hx of AAA: stable and follow as an outpatient  5-GERD: continue PPI, stable  6-type 2 diabetes: with PVD, renal manifestation and hyperglycemia -Hyperglycemia secondary to use of steroids, continue lantus  7-essential HTN: stable and well controlled -Will continue to monitor  8-hx of seizures: continue keppra  9-elevated lactic acid: trended down -will continue holding metformin -resolved  10-acute on CKD stage 3:  -suspect cardiorenal syndrome, improving, continue diuresis  11. Hyperkalemia - resolved  Code Status: DNR Family Communication: no family at bedside Disposition Plan: Tx to floor, attempt to wean O2, , plan for Home with Hospice tomorrow   Consultants:  Palliative care  PCCM   Procedures:  See below for x-ray reports   Antibiotics:  None   HPI/Subjective: Breathing better, no events overnight  Objective: Filed Vitals:   05/15/15 0717  BP:    Pulse:   Temp: 97.6 F (36.4 C)  Resp:     Intake/Output Summary (Last 24 hours) at 05/15/15 0951 Last data filed at 05/15/15 0936  Gross per 24 hour  Intake      0 ml  Output   2050 ml  Net  -2050 ml   Filed Weights   05/13/15 0500 05/14/15 0500 05/15/15 0441  Weight: 78.699 kg (173 lb 8 oz) 77.066 kg (169 lb 14.4 oz) 75.932 kg (167 lb 6.4 oz)    Exam:   General:  Alert and awake, in NAD  Cardiovascular: positive JVD, rate controlled, no rubs or gallops  Respiratory: no wheezing, positive fine bibasilar crackles BL. No use of accessory muscles, on non rebreather  Abdomen: soft, NT, ND, positive BS  Musculoskeletal: no cyanosis, no erythema, 2plus edema  Data Reviewed: Basic Metabolic Panel:  Recent Labs Lab 05/09/15 0258  05/09/15 1246 05/10/15 0347 05/11/15 1120 05/14/15 0018 05/15/15 0544  NA 139  --   --  140 141 138 139  K 6.2*  < > 6.0* 5.3* 3.5 4.3 4.1  CL 98*  --   --  98* 94* 94* 92*  CO2 32  --   --  31 36* 34* 37*  GLUCOSE 191*  --   --  247* 174* 308* 252*  BUN 43*  --   --  46* 36* 49* 45*  CREATININE 2.08*  --   --  2.03* 1.92* 1.93* 1.72*  CALCIUM 8.9  --   --  9.1 8.9 8.7* 8.7*  < > = values in this interval not displayed. Liver Function Tests: No results for input(s): AST, ALT, ALKPHOS, BILITOT, PROT, ALBUMIN in the last  168 hours. CBC:  Recent Labs Lab 05/12/15 0559 05/15/15 0544  WBC 10.9* 11.5*  HGB 10.1* 10.4*  HCT 35.4* 35.0*  MCV 84.1 80.6  PLT 109* 127*   BNP (last 3 results)  Recent Labs  10-May-2015 1958  BNP 1843.2*    ProBNP (last 3 results)  Recent Labs  06/02/14  PROBNP 13998.0*    CBG:  Recent Labs Lab 05/14/15 0807 05/14/15 1240 05/14/15 1613 05/14/15 2208 05/15/15 0713  GLUCAP 313* 262* 146* 219* 233*    Recent Results (from the past 240 hour(s))  MRSA PCR Screening     Status: None   Collection Time: 2015-05-10  9:47 PM  Result Value Ref Range Status   MRSA by PCR NEGATIVE NEGATIVE Final     Comment:        The GeneXpert MRSA Assay (FDA approved for NASAL specimens only), is one component of a comprehensive MRSA colonization surveillance program. It is not intended to diagnose MRSA infection nor to guide or monitor treatment for MRSA infections.      Studies: No results found.  Scheduled Meds: . apixaban  2.5 mg Oral BID  . arformoterol  15 mcg Nebulization BID  . aspirin EC  81 mg Oral Daily  . atorvastatin  40 mg Oral Q M,W,F  . budesonide  0.5 mg Nebulization BID  . furosemide  80 mg Intravenous Q8H  . insulin aspart  0-15 Units Subcutaneous TID WC  . insulin glargine  10 Units Subcutaneous Daily  . ipratropium-albuterol  3 mL Nebulization Q6H  . levETIRAcetam  500 mg Oral BID  . montelukast  10 mg Oral QHS  . pantoprazole  40 mg Oral BID AC  . PARoxetine  10 mg Oral Daily  . predniSONE  50 mg Oral Q breakfast   Continuous Infusions:   Principal Problem:   Acute on chronic respiratory failure with hypoxia Active Problems:   Hypertension   COPD (chronic obstructive pulmonary disease)   T2_NIDDM w/CKD   Chronic atrial fibrillation   COPD, severe   Chronic renal failure, stage 3 (moderate)   Acute on chronic combined systolic and diastolic congestive heart failure   Acute and chronic respiratory failure with hypoxia   Hyperbilirubinemia   Elevated lactic acid level   Hypoxia   Palliative care encounter   Dyspnea   SOB (shortness of breath)    Time spent: 35 minutes    [facility]  [facility] Pager [phone]. If 7PM-7AM, please contact night-coverage at [email or web address], password [facility] 05/15/2015, 9:51 AM  LOS: 9 days

## 2015-05-15 NOTE — Progress Notes (Signed)
PULMONARY / CRITICAL CARE MEDICINE   Name: [name] MRN: [id number] DOB: June 18, 1932    ADMISSION DATE:  May 20, 2015 CONSULTATION DATE:  05/12/15   REFERRING MD :  [facility]  CHIEF COMPLAINT:  Sob/ 02 dep  INITIAL PRESENTATION:  83 yowf quit smoking around 1990 with dx of copd/ 02 dep resp failure admtted 9/13 with acute on chronic resp failure with predominant atx changes on cxr which show low volumes so pulmonary asked to eval am 9/18  STUDIES:  Repeat Echo 9/18 >>> EF 55-60%, severely dilated RA, mod TR, PA press  9/18 BLE venous dopplers >>>  ? Indeterminate age vs chronic DVT in left common femoral and proximal femoral veins.  SIGNIFICANT EVENTS:  9/21 Palliative care consult / meeting with family >>> goal is home with hospice (hopefully 9/23).  DNR / DNI, NO escalation of care, no further BiPAP, no feeding tubes now or in future, avoid rehospitalization's.  SUBJECTIVE:  Palliative care discussion / meeting with family noted.  NO escalation of care.  Plan is for pt to return home with hospice, hopefully 9/23. 9/22 she is resting comfortably in recliner.  Denies chest pain, SOB.  Feels breathing somewhat better but still requiring NRB. SpO2 98%.  VITAL SIGNS: Temp:  [96.8 F (36 C)-97.9 F (36.6 C)] 97.3 F (36.3 C) (09/22 1131) Pulse Rate:  [69-71] 69 (09/22 1131) Resp:  [21-27] 21 (09/22 1131) BP: (108-148)/(67-83) 148/69 mmHg (09/22 1131) SpO2:  [91 %-100 %] 93 % (09/22 0803) FiO2 (%):  [70 %] 70 % (09/22 0803) Weight:  [75.932 kg (167 lb 6.4 oz)] 75.932 kg (167 lb 6.4 oz) (09/22 0441) HEMODYNAMICS:   VENTILATOR SETTINGS: Vent Mode:  [-]  FiO2 (%):  [70 %] 70 % INTAKE / OUTPUT:  Intake/Output Summary (Last 24 hours) at 05/15/15 1306 Last data filed at 05/15/15 1131  Gross per 24 hour  Intake    300 ml  Output   1950 ml  Net  -1650 ml    PHYSICAL EXAMINATION:  Wt Readings from Last 3 Encounters:  05/15/15 75.932 kg (167 lb 6.4 oz)  11/22/14 82.555  kg (182 lb)  11/12/14 76.6 kg (168 lb 14 oz)    EXAM: General: chronically ill appearing female, sitting in recliner resting, in NAD. Neuro: Sleepy but easy to arouse.  MAE's and follows simple commands. CV:  IRIR, no M/R/G. PULM: resps eve and non labored on NRB, diminished bases but clear without W/R/R. GI: BS x 4, soft, NT/ND. Extremities:  Warm and dry, 1+ BLE edema, symmetric.    LABS:  CBC  Recent Labs Lab 05/12/15 0559 05/15/15 0544  WBC 10.9* 11.5*  HGB 10.1* 10.4*  HCT 35.4* 35.0*  PLT 109* 127*   Coag's No results for input(s): APTT, INR in the last 168 hours. BMET  Recent Labs Lab 05/11/15 1120 05/14/15 0018 05/15/15 0544  NA 141 138 139  K 3.5 4.3 4.1  CL 94* 94* 92*  CO2 36* 34* 37*  BUN 36* 49* 45*  CREATININE 1.92* 1.93* 1.72*  GLUCOSE 174* 308* 252*   Electrolytes  Recent Labs Lab 05/11/15 1120 05/14/15 0018 05/15/15 0544  CALCIUM 8.9 8.7* 8.7*  MG  --   --  2.1   Sepsis Markers  Recent Labs Lab 05/09/15 0258  LATICACIDVEN 1.7   ABG No results for input(s): PHART, PCO2ART, PO2ART in the last 168 hours. Liver Enzymes No results for input(s): AST, ALT, ALKPHOS, BILITOT, ALBUMIN in the last 168 hours. Cardiac Enzymes  No results for input(s): TROPONINI, PROBNP in the last 168 hours. Glucose  Recent Labs Lab 05/14/15 0807 05/14/15 1240 05/14/15 1613 05/14/15 2208 05/15/15 0713 05/15/15 1130  GLUCAP 313* 262* 146* 219* 233* 350*     Images reviewed: Last pCXR  05/08/15 bilateral basilar atx/ massive CM    ASSESSMENT / PLAN:  Acute on chronic hypoxic respiratory failure with bilateral atx changes on cxr. Hx COPD on 5-6L home O2. Acute on chronic diastolic CHF. Acute on CKD 3. HTN. Chronic AFib on eliquis. ? Indeterminate age vs chronic DVT in left common femoral and proximal femoral veins. PAH - new diagnosis this admission (PAP 67 on echo).  Significant changes in echo with severely dilated RA and PA pressure  (since last echo in March 2016) certainly raises suspicion for PE given ongoing severe hypoxia requiring NRB despite diuresis and rx for AECOPD.  Not bronchospastic on exam.  However, pt on eliquis at home which would make PE much less likely.  Acute on CKD3 makes CTA more difficult and she certainly has other explanations for her respiratory failure.    PLAN: Continue gentle diuresis as Scr and BP tolerate. Continue O2, wean as able to keep sats 88-92%. NO further BiPAP / NIMV after goals of care discussion with palliative care. Continue steroids, BD's, PPI. Morphine PRN. Incentive spirometry / flutter valve. Continue eliquis. No IVC filter given decision on NO escalation of care and home with hospice.   Rutherford Guys, [name] [facility] Pager: [phone]  or [phone] 05/15/2015, 1:06 PM  Attending Note:  81 year old female with history of COPD and CHF who had a normal PAP in march now with PAP in the 60's with acute on chronic hypoxemic respiratory failure as well as a COPD exacerbation. DDx of acute on chronic PE vs worsening heart failure, unlikely to develop IPF at her age that quickly. I reviewed CXR myself with evidence of pulmonary edema.  Discussed with PCCM NP and bedside RN.  Acute on chronic hypoxemic respiratory failure: concern for oxidative damage to the lung at this point. - Diureses as renal function allows. - Treat COPD as ordered. - Would like to evaluate for PE but renal function precludes, regardless will be on anti-coagulation. - Titrate O2 for sat of 88-92%.  Acute worsening of PAP. - Will add norvasc for vascular dilation. - Diureses as able. - Anti-coag as ordered (eliquis).  DVT/PT: - Continue anti-coagulation. - Lower ext dopplers ordered, if positive will need an IVC filter.  COPD: -  Continue spiriva. - Continue symbicort. - Solumedrol as ordered.  Patient seen and examined, agree with above note. I dictated the care and orders written for this patient under my direction.  [name] Reedy, MD [phone]

## 2015-05-15 NOTE — Progress Notes (Signed)
Pt transferred from chair to [facility] weak but able to take a few steps. Says she does not feel well today - leads coming off from movement but looks like up to 20 beats of V-tach noted in lead 2 . Pt alert/oriented new electrodes applied. Call to Dr Jomarie Longs to make aware. Ordered a  Magnesium level and  12 lead EKG. Pt right back to v-paced at 70 no drop in BP. No resp distress still on partial nonrebreather at 70 % 02.

## 2015-05-16 ENCOUNTER — Inpatient Hospital Stay (HOSPITAL_COMMUNITY): Payer: Medicare Other

## 2015-05-16 LAB — GLUCOSE, CAPILLARY
GLUCOSE-CAPILLARY: 189 mg/dL — AB (ref 65–99)
Glucose-Capillary: 200 mg/dL — ABNORMAL HIGH (ref 65–99)

## 2015-05-16 LAB — BASIC METABOLIC PANEL
Anion gap: 13 (ref 5–15)
BUN: 41 mg/dL — AB (ref 6–20)
CALCIUM: 9.5 mg/dL (ref 8.9–10.3)
CO2: 39 mmol/L — ABNORMAL HIGH (ref 22–32)
Chloride: 89 mmol/L — ABNORMAL LOW (ref 101–111)
Creatinine, Ser: 1.89 mg/dL — ABNORMAL HIGH (ref 0.44–1.00)
GFR calc Af Amer: 28 mL/min — ABNORMAL LOW (ref >60)
GFR, EST NON AFRICAN AMERICAN: 24 mL/min — AB (ref >60)
GLUCOSE: 213 mg/dL — AB (ref 65–99)
Potassium: 3.6 mmol/L (ref 3.5–5.1)
Sodium: 141 mmol/L (ref 135–145)

## 2015-05-16 LAB — CBC
HCT: 39.4 % (ref 36.0–46.0)
HCT: 38.8 % (ref 36.0–46.0)
Hemoglobin: 11.8 g/dL — ABNORMAL LOW (ref 12.0–15.0)
Hemoglobin: 11.3 g/dL — ABNORMAL LOW (ref 12.0–15.0)
MCH: 24.7 pg — ABNORMAL LOW (ref 26.0–34.0)
MCH: 23.9 pg — AB (ref 26.0–34.0)
MCHC: 29.9 g/dL — AB (ref 30.0–36.0)
MCHC: 29.1 g/dL — ABNORMAL LOW (ref 30.0–36.0)
MCV: 82.4 fL (ref 78.0–100.0)
MCV: 82 fL (ref 78.0–100.0)
PLATELETS: 123 10*3/uL — AB (ref 150–400)
Platelets: 152 10*3/uL (ref 150–400)
RBC: 4.78 MIL/uL (ref 3.87–5.11)
RBC: 4.73 MIL/uL (ref 3.87–5.11)
RDW: 16.3 % — AB (ref 11.5–15.5)
RDW: 16.4 % — AB (ref 11.5–15.5)
WBC: 27.9 10*3/uL — ABNORMAL HIGH (ref 4.0–10.5)
WBC: 28.9 10*3/uL — AB (ref 4.0–10.5)

## 2015-05-16 MED ORDER — SODIUM CHLORIDE 0.9 % IV SOLN: 0.5000 mg/h | INTRAVENOUS | Status: DC

## 2015-05-16 MED ORDER — MORPHINE SULFATE (PF) 2 MG/ML IV SOLN
1.0000 mg | INTRAVENOUS | Status: DC | PRN
  Filled 2015-05-16: qty 1

## 2015-05-16 MED ORDER — SODIUM CHLORIDE 0.9 % IV SOLN
0.5000 mg/h | INTRAVENOUS | Status: DC
  Administered 2015-05-16: 0.5 mg/h via INTRAVENOUS
  Filled 2015-05-16: qty 10

## 2015-05-16 NOTE — Progress Notes (Signed)
O2 sats staying at 84-85 on PRB mask, placed on NRB mask and O2 increased to low 90s.

## 2015-05-16 NOTE — Progress Notes (Signed)
Daily Progress Note   Patient Name: [name]       Date: 05/16/2015 DOB: 10-11-1931  Age: 81 y.o. MRN#: [id number] Attending Physician: Zannie Cove, MD Primary Care Physician: Nadean Corwin, MD Admit Date: 05/10/2015  Reason for Consultation/Follow-up: Establishing goals of care, Psychosocial/spiritual support and Terminal care  Subjective:     -patient continues to decompensate, discussion with Dr Jomarie Longs and daughter [name] regarding limited prognosis and concern for transport  -decision to start a Morphine gtt here and if transfer makes sense family is open to hospice facility  -comfort and dignity are priority   Length of Stay: 10 days  Current Medications: Scheduled Meds:     Continuous Infusions: . morphine      PRN Meds: albuterol, LORazepam, morphine injection, morphine CONCENTRATE  Palliative Performance Scale: 20 %     Vital Signs: BP 113/96 mmHg  Pulse 73  Temp(Src) 99.4 F (37.4 C) (Oral)  Resp 33  Ht  (1.626 m)  Wt 75.932 kg (167 lb 6.4 oz)  BMI 28.72 kg/m2  SpO2 86% SpO2: SpO2: (!) 86 % O2 Device: O2 Device: NRB O2 Flow Rate: O2 Flow Rate (L/min): 15 L/min  Intake/output summary:  Intake/Output Summary (Last 24 hours) at 05/16/15 1039 Last data filed at 05/16/15 0300  Gross per 24 hour  Intake    460 ml  Output   2120 ml  Net  -1660 ml   LBM: Last BM Date: 05/13/15 Baseline Weight: Weight: 79.8 kg (175 lb 14.8 oz) Most recent weight: Weight:  (unable at this time due to pt inability to stand on scale)  Physical Exam:          General: ill appearing, dyspnic HEENT: dry buccal membranes, no exudate CVS: RRR Resp: tachypecnic  Abd: soft NT  Decreased BS Skin: warm and dry    Additional Data Reviewed: Recent Labs     05/15/15  0544  05/16/15  0303  05/16/15  0650  WBC  11.5*  27.9*  28.9*  HGB  10.4*  11.8*  11.3*  PLT  127*  152  123*  NA  139  141   --   BUN  45*  41*   --   CREATININE  1.72*  1.89*    --      Problem List:  Patient Active Problem List   Diagnosis Date Noted  . Palliative care encounter 05/14/2015  . Dyspnea 05/14/2015  . SOB (shortness of breath)   . Hypoxia   . Acute on chronic combined systolic and diastolic congestive heart failure 05/16/2015  . Acute and chronic respiratory failure with hypoxia 04/29/2015  . Hyperbilirubinemia 05/02/2015  . Elevated lactic acid level 05/13/2015  . HLD (hyperlipidemia)   . Urinary tract infection in elderly patient   . Chronic systolic CHF (congestive heart failure)   . Seizures   . COPD, severe   . Chronic systolic congestive heart failure   . Chronic renal failure, stage 3 (moderate)   . Altered mental status 11/04/2014  . Acute encephalopathy 11/04/2014  . Chronic systolic heart failure 11/04/2014  . Chronic atrial fibrillation 11/04/2014  . Chronic diastolic heart failure 01/07/2014  . Medication management 12/31/2013  . Long term (current) use of anticoagulants 12/19/2013  . Acute on chronic respiratory failure with hypoxia 12/05/2013  . NSTEMI (non-ST elevated myocardial infarction) 12/05/2013  . PNA (pneumonia) 12/05/2013  . Vitamin D deficiency 09/11/2013  . Hypertension   . COPD (chronic obstructive pulmonary disease)   .  T2_NIDDM w/CKD   . GERD (gastroesophageal reflux disease)   . DVT (deep venous thrombosis)   . B12 deficiency   . AV BLOCK, COMPLETE 01/22/2009  . Congestive heart failure, unspecified 01/22/2009  . PACEMAKER-[place]ude 01/22/2009  . PERSONAL HX COLONIC POLYPS 12/17/2008  . ANXIETY DEPRESSION 11/10/2007  . ABDOMINAL AORTIC ANEURYSM 11/10/2007  . ASTHMA 11/10/2007  . DIVERTICULOSIS, COLON 11/10/2007     Palliative Care Assessment & Plan    Code Status:  DNR  Goals of Care:  Focus of care is comfort  Desire for further Chaplaincy support:yes   Symptom Management:  Morphine gtt with titration orders to enhance comfort    Prognosis: Hours - Days   Discharge Planning:  Expect hospital death  Care plan was discussed with Dr Jomarie Longs  Thank you for allowing the Palliative Medicine Team to assist in the care of this patient.   Time In: 1000 Time Out: 1100 Total Time 60 min Prolonged Time Billed  no     Greater than 50%  of this time was spent counseling and coordinating care related to the above assessment and plan.     Canary Brim, NP  05/16/2015, 10:39 AM  Please contact Palliative Medicine Team phone at [phone] for questions and concerns.

## 2015-05-16 NOTE — Progress Notes (Signed)
Attempted to weigh pt on standing scale, pt unable at this time. O2 sats dropped to 70s, slow to come back up on PRB, placed on NRB for now.

## 2015-05-16 NOTE — Progress Notes (Signed)
Received patient from 2C, with Morphine drip.

## 2015-05-16 NOTE — Progress Notes (Signed)
PULMONARY / CRITICAL CARE MEDICINE   Name: [name] MRN: [id number] DOB: 1932-05-15    ADMISSION DATE:  05/03/2015 CONSULTATION DATE:  05/12/15   REFERRING MD :  [facility]  CHIEF COMPLAINT:  Sob/ 02 dep  INITIAL PRESENTATION:  83 yowf quit smoking around 1990 with dx of copd/ 02 dep resp failure admtted 9/13 with acute on chronic resp failure with predominant atx changes on cxr which show low volumes so pulmonary asked to eval am 9/18  STUDIES:  Repeat Echo 9/18 >>> EF 55-60%, severely dilated RA, mod TR, PA press  9/18 BLE venous dopplers >>>  ? Indeterminate age vs chronic DVT in left common femoral and proximal femoral veins.  SIGNIFICANT EVENTS:  9/21 Palliative care consult / meeting with family >>> goal is home with hospice (hopefully 9/23).  DNR / DNI, NO escalation of care, no further BiPAP, no feeding tubes now or in future, avoid rehospitalization's.  SUBJECTIVE:  Palliative care discussion / meeting with family noted.  NO escalation of care.  Plan is for pt to return home with hospice, hopefully 9/23. 9/22 she is resting comfortably in recliner.  Denies chest pain, SOB.  Feels breathing somewhat better but still requiring NRB. SpO2 98%. Patient is unresponsive at this point  VITAL SIGNS: Temp:  [97.1 F (36.2 C)-99.4 F (37.4 C)] 99.4 F (37.4 C) (09/23 0846) Pulse Rate:  [69-73] 73 (09/23 0846) Resp:  [21-33] 33 (09/23 0846) BP: (113-148)/(51-96) 113/96 mmHg (09/23 0846) SpO2:  [85 %-97 %] 86 % (09/23 0846) FiO2 (%):  [15 %] 15 % (09/23 0838) HEMODYNAMICS:   VENTILATOR SETTINGS: Vent Mode:  [-]  FiO2 (%):  [15 %] 15 % INTAKE / OUTPUT:  Intake/Output Summary (Last 24 hours) at 05/16/15 1110 Last data filed at 05/16/15 0300  Gross per 24 hour  Intake    460 ml  Output   2120 ml  Net  -1660 ml    PHYSICAL EXAMINATION:  Wt Readings from Last 3 Encounters:  05/15/15 75.932 kg (167 lb 6.4 oz)  11/22/14 82.555 kg (182 lb)  11/12/14 76.6 kg (168 lb  14 oz)    EXAM: General: chronically ill appearing female, unresponsive Neuro: Obtunded and hypoxic, withdraws to pain but no commands. CV:  IRIR, no M/R/G. PULM: resps even and non labored on NRB, diminished bases but clear without W/R/R. GI: BS x 4, soft, NT/ND. Extremities:  Warm and dry, 1+ BLE edema, symmetric.    LABS:  CBC  Recent Labs Lab 05/15/15 0544 05/16/15 0303 05/16/15 0650  WBC 11.5* 27.9* 28.9*  HGB 10.4* 11.8* 11.3*  HCT 35.0* 39.4 38.8  PLT 127* 152 123*   Coag's No results for input(s): APTT, INR in the last 168 hours. BMET  Recent Labs Lab 05/14/15 0018 05/15/15 0544 05/16/15 0303  NA 138 139 141  K 4.3 4.1 3.6  CL 94* 92* 89*  CO2 34* 37* 39*  BUN 49* 45* 41*  CREATININE 1.93* 1.72* 1.89*  GLUCOSE 308* 252* 213*   Electrolytes  Recent Labs Lab 05/14/15 0018 05/15/15 0544 05/16/15 0303  CALCIUM 8.7* 8.7* 9.5  MG  --  2.1  --    Sepsis Markers No results for input(s): LATICACIDVEN, PROCALCITON, O2SATVEN in the last 168 hours. ABG No results for input(s): PHART, PCO2ART, PO2ART in the last 168 hours. Liver Enzymes No results for input(s): AST, ALT, ALKPHOS, BILITOT, ALBUMIN in the last 168 hours. Cardiac Enzymes No results for input(s): TROPONINI, PROBNP in the last 168  hours. Glucose  Recent Labs Lab 05/14/15 2208 05/15/15 0713 05/15/15 1130 05/15/15 1628 05/15/15 2153 05/16/15 0812  GLUCAP 219* 233* 350* 174* 160* 189*     Images reviewed: Last pCXR  05/08/15 bilateral basilar atx/ massive CM    ASSESSMENT / PLAN:  Acute on chronic hypoxic respiratory failure with bilateral atx changes on cxr. Hx COPD on 5-6L home O2. Acute on chronic diastolic CHF. Acute on CKD 3. HTN. Chronic AFib on eliquis. ? Indeterminate age vs chronic DVT in left common femoral and proximal femoral veins. PAH - new diagnosis this admission (PAP 67 on echo).  Significant changes in echo with severely dilated RA and PA pressure  (since last echo in March 2016) certainly raises suspicion for PE given ongoing severe hypoxia requiring NRB despite diuresis and rx for AECOPD.  Not bronchospastic on exam.  However, pt on eliquis at home which would make PE much less likely.  Acute on CKD3 makes CTA more difficult and she certainly has other explanations for her respiratory failure.    81 year old female with history of COPD and CHF who had a normal PAP in march now with PAP in the 60's with acute on chronic hypoxemic respiratory failure as well as a COPD exacerbation. DDx of acute on chronic PE vs worsening heart failure, unlikely to develop IPF at her age that quickly. I reviewed CXR myself with evidence of pulmonary edema.  Discussed with PCCM NP and bedside RN.  Acute on chronic hypoxemic respiratory failure: concern for oxidative damage to the lung at this point. - Hold further treatment, morphine for comfort at this point.  Acute worsening of PAP. - Morphine for comfort.  DVT/PT: - Morphine for comfort.  COPD: - D/C inhalers.  - Morphine for comfort.  PCCM will sign off, please call back if needed.  Alyson Reedy, M.D. [facility] Pulmonary/Critical Care Medicine. Pager: [phone]. After hours pager: [phone].

## 2015-05-16 NOTE — Progress Notes (Addendum)
[facility] PROGRESS NOTE  [name]:[id number] DOB: 12/15/1931 DOA: 05/22/2015 PCP: Nadean Corwin, MD  Assessment/Plan: 1- Acute on Chronic resp failure with hypoxia/ Acute diastolic heart failure:  Worsened overnight, sats now high 80s on NRM, CXR this am with new consolidation, suspect aspiration, WBC 27K this am -patient chronically on 5-6L at home -felt to be due to acute on chronic diastolic HF and COPD exacerbation, and now aspiration pneumonia -no improvement despite IV lasix at high dose, solumedrol, nebs -Dopplers with age indeterminate vs chronic LLE DVT, on Apixaban for Afib, per Pharmacy continue same dose, VQ scan on 9/15 with low prob of PE -Palliative consulted and following, appreciate Skeet Simmer consult, given significant deterioration, worsening hypoxia and resp distress now, i called and d/w Daughter [name] and recommended Full Comfort care, and stated that patient too unstable to discharge, she understands and is agreeable -start IV morphine, Mary to order morphine gtt further orders  2-chronic atrial fibrillation: CHADsVASC score 6 - see above  3 CKD 4  4-Hx of AAA:   5-GERD  6-type 2 diabetes: with PVD, renal manifestation and hyperglycemia  7-essential HTN:   8-hx of seizures: continue keppra  9-elevated lactic acid  10.Hyperkalemia - resolved  Code Status: DNR Family Communication: no family at bedside, called and updated daughter Disposition Plan: comfort measures , anticipate in hospital demise  Consultants:  Palliative care  PCCM   Procedures:  See below for x-ray reports   Antibiotics:  None   HPI/Subjective: worsened resp distress since 3am  Objective: Filed Vitals:   05/16/15 0846  BP: 113/96  Pulse: 73  Temp: 99.4 F (37.4 C)  Resp: 33    Intake/Output Summary (Last 24 hours) at 05/16/15 1027 Last data filed at 05/16/15 0300  Gross per 24 hour  Intake    460 ml  Output   2120 ml  Net  -1660  ml   Filed Weights   05/13/15 0500 05/14/15 0500 05/15/15 0441  Weight: 78.699 kg (173 lb 8 oz) 77.066 kg (169 lb 14.4 oz) 75.932 kg (167 lb 6.4 oz)    Exam:   General:  Lethargic, somnolent, tachypneic  Cardiovascular: positive JVD, rate controlled, no rubs or gallops  Respiratory: no wheezing, positive fine bibasilar crackles BL. ronchi at R base, in resp distress  Abdomen: soft, NT, ND, positive BS  Musculoskeletal: no cyanosis, no erythema, 2plus edema  Data Reviewed: Basic Metabolic Panel:  Recent Labs Lab 05/10/15 0347 05/11/15 1120 05/14/15 0018 05/15/15 0544 05/16/15 0303  NA 140 141 138 139 141  K 5.3* 3.5 4.3 4.1 3.6  CL 98* 94* 94* 92* 89*  CO2 31 36* 34* 37* 39*  GLUCOSE 247* 174* 308* 252* 213*  BUN 46* 36* 49* 45* 41*  CREATININE 2.03* 1.92* 1.93* 1.72* 1.89*  CALCIUM 9.1 8.9 8.7* 8.7* 9.5  MG  --   --   --  2.1  --    Liver Function Tests: No results for input(s): AST, ALT, ALKPHOS, BILITOT, PROT, ALBUMIN in the last 168 hours. CBC:  Recent Labs Lab 05/12/15 0559 05/15/15 0544 05/16/15 0303 05/16/15 0650  WBC 10.9* 11.5* 27.9* 28.9*  HGB 10.1* 10.4* 11.8* 11.3*  HCT 35.4* 35.0* 39.4 38.8  MCV 84.1 80.6 82.4 82.0  PLT 109* 127* 152 123*   BNP (last 3 results)  Recent Labs  05-22-2015 1958  BNP 1843.2*    ProBNP (last 3 results)  Recent Labs  06/02/14  PROBNP 13998.0*    CBG:  Recent Labs Lab 05/15/15 0713 05/15/15 1130 05/15/15 1628 05/15/15 2153 05/16/15 0812  GLUCAP 233* 350* 174* 160* 189*    Recent Results (from the past 240 hour(s))  MRSA PCR Screening     Status: None   Collection Time: 05/21/2015  9:47 PM  Result Value Ref Range Status   MRSA by PCR NEGATIVE NEGATIVE Final    Comment:        The GeneXpert MRSA Assay (FDA approved for NASAL specimens only), is one component of a comprehensive MRSA colonization surveillance program. It is not intended to diagnose MRSA infection nor to guide or monitor  treatment for MRSA infections.      Studies: Dg Chest Port 1 View  05/16/2015   CLINICAL DATA:  Leukocytosis. History of CHF, asthma, COPD, hypertension.  EXAM: PORTABLE CHEST 1 VIEW  COMPARISON:  05/11/2015  FINDINGS: Cardiac pacemaker. Cardiac enlargement with mild central pulmonary vascular congestion. Diffuse interstitial changes in the lungs probably representing chronic fibrosis. Superimposed interstitial edema not excluded. Suggestion of consolidation behind the heart which may be increasing since previous study. No blunting of costophrenic angles. No pneumothorax. Calcified and tortuous aorta.  IMPRESSION: Cardiac enlargement. Interstitial pattern to the lungs is probably due to chronic fibrosis. Mild residual edema not excluded. Suggestion of developing consolidation behind the heart. A lateral view may be useful if the patient is able.   Electronically Signed   By: Burman Nieves M.D.   On: 05/16/2015 06:13    Scheduled Meds: . amLODipine  5 mg Oral Daily  . apixaban  2.5 mg Oral BID  . arformoterol  15 mcg Nebulization BID  . aspirin EC  81 mg Oral Daily  . atorvastatin  40 mg Oral Q M,W,F  . budesonide  0.5 mg Nebulization BID  . furosemide  80 mg Intravenous Q8H  . insulin aspart  0-15 Units Subcutaneous TID WC  . insulin glargine  10 Units Subcutaneous Daily  . ipratropium-albuterol  3 mL Nebulization Q6H  . levETIRAcetam  500 mg Oral BID  . montelukast  10 mg Oral QHS  . pantoprazole  40 mg Oral BID AC  . PARoxetine  10 mg Oral Daily  . predniSONE  50 mg Oral Q breakfast   Continuous Infusions:   Principal Problem:   Acute on chronic respiratory failure with hypoxia Active Problems:   Hypertension   COPD (chronic obstructive pulmonary disease)   T2_NIDDM w/CKD   Chronic atrial fibrillation   COPD, severe   Chronic renal failure, stage 3 (moderate)   Acute on chronic combined systolic and diastolic congestive heart failure   Acute and chronic respiratory failure  with hypoxia   Hyperbilirubinemia   Elevated lactic acid level   Hypoxia   Palliative care encounter   Dyspnea   SOB (shortness of breath)    Time spent: 35 minutes    [facility]  [facility] Pager [phone]. If 7PM-7AM, please contact night-coverage at [email or web address], password [facility] 05/16/2015, 10:27 AM  LOS: 10 days

## 2015-05-16 NOTE — Care Management Important Message (Signed)
Important Message  Patient Details  Name: KIYONNA TORTORELLI MRN: [id number] Date of Birth: 1932-04-24   Medicare Important Message Given:  Yes-fourth notification given    Bernadette Hoit 05/16/2015, 10:25 AM

## 2015-05-16 NOTE — Progress Notes (Signed)
   05/16/15 1600  Clinical Encounter Type  Visited With Patient and family together  Visit Type Spiritual support  Referral From Nurse;Palliative care team  Recommendations (See Pt first thing on Monday)  Chaplain visited with Pt twice; Chaplain saw that there were a large amount of family members; Pt then was transferred; Chaplain intends to visit Monday

## 2015-05-16 NOTE — Progress Notes (Signed)
Pt lethargic,tacypneic, easily desats PRN pain medicine given,refused to eat and did not want to take her pills,able to pivot back to bed with 3 assists, will continue to monitor.

## 2015-05-17 ENCOUNTER — Encounter: Payer: Self-pay | Admitting: Internal Medicine

## 2015-05-24 NOTE — Discharge Summary (Addendum)
Death Summary  [name]:[id number] DOB: [birth year] DOA: 05-31-2015  PCP: Nadean Corwin, MD  Admit date: 2015/05/31 Date of Death: 11-Jun-2015  Final Diagnoses:  Principal Problem:   Acute on chronic respiratory failure with hypoxia Active Problems:   Hypertension   COPD (chronic obstructive pulmonary disease)   T2_NIDDM w/CKD   Chronic atrial fibrillation   COPD, severe   Chronic renal failure, stage 3 (moderate)   Acute on chronic combined systolic and diastolic congestive heart failure   Acute and chronic respiratory failure with hypoxia   Hyperbilirubinemia   Elevated lactic acid level   Hypoxia   Palliative care encounter   Dyspnea   SOB (shortness of breath)   History of present illness:  Chief Complaint: Low O2 levels HPI: [name] is a 81 y.o. female with a past medical history significant for COPD on 5L home O2 and daily prednisone 10 mg, chronic combined systolic and diastolic heart failure, atrial fibrillation on anticoagulation, pacemaker, NIDDM, hypertension, chronic anemia and CKD stage III-IV who presented with hypoxia and elevated lactic acid  Hospital Course:  Acute on Chronic resp failure with hypoxia/ Acute diastolic heart failure: -due to advanced COPD and CHF, uses 6L Home O2 -had palliative meeting and decision was made for Home with Hospice -then worsened overnight 9/23 with sats in high 80s on Non re breather mask, then CXRshowed new consolidation, suspected to be from aspiration, WBC worsened to 27K, then after discussion with daughter decision made for full comfort measures -started on IV morphine and expired on 11-Jun-2023  2-chronic atrial fibrillation: CHADsVASC score 6  3 CKD 4  4-Hx of AAA:   5-GERD  6-type 2 diabetes: with PVD, renal manifestation and hyperglycemia  7-essential HTN:   8-hx of seizures: continue keppra  9-elevated lactic acid -due to poor perfusion from CHF  10.Hyperkalemia    Time:   Signed:  JOSEPH,PREETHA  [facility] 05/20/2015, 1:52 PM

## 2015-05-24 NOTE — Progress Notes (Signed)
Wasted 200 cc from Morphine drip /1cc solution witnessed by Lisette Grinder, RN

## 2015-05-24 NOTE — Progress Notes (Signed)
@  1024, patient did not have any pulse via auscultation, no respirationand with dilated pupils, findings confirmed by Arthur Holms.  Daughters and son at bedside.  Attending physician notified, [facility] notified with referral [id number].

## 2015-05-24 DEATH — deceased

## 2015-08-01 ENCOUNTER — Encounter: Payer: Medicare Other | Admitting: Internal Medicine

## 2016-03-16 IMAGING — CR DG CHEST 1V PORT
1 series · 1 of 1 positions shown · non-contrast
Comparison: Chest x-ray 12/04/2013.

CLINICAL DATA: Shortness of breath.  Dyspnea on exertion.

EXAM:
PORTABLE CHEST - 1 VIEW

[AP]
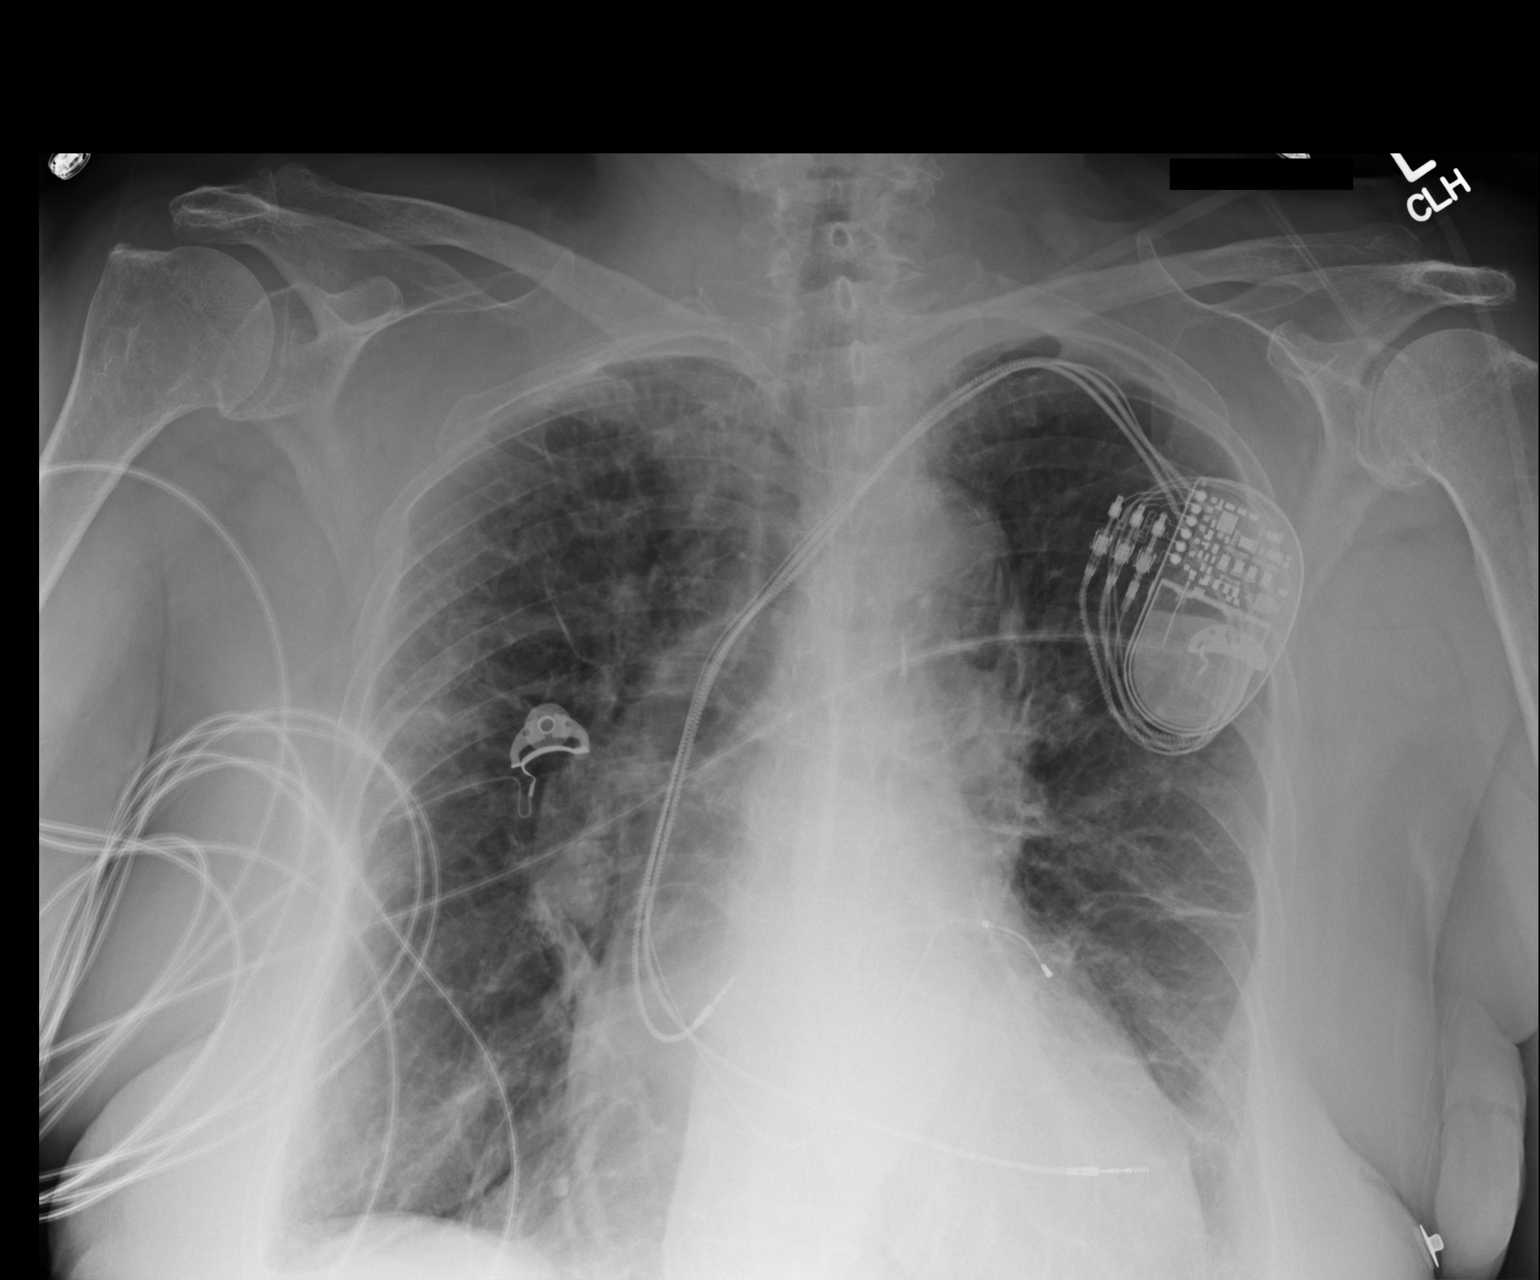

[1 of 1 positions shown; findings below may reference images not displayed]

FINDINGS: There is cephalization of the pulmonary vasculature and slight
indistinctness of the interstitial markings suggestive of mild
pulmonary edema. Focal areas of linear architectural distortion in
the right upper lung, right lower lung and left midlung, similar to
prior studies, favored to represent areas of post infectious or
inflammatory scarring. No definite consolidative airspace disease.
Limited study which fails to demonstrate the left lung base. No
definite pleural effusions. Heart size is mildly enlarged. The
patient is rotated to the right on today's exam, resulting in
distortion of the mediastinal contours and reduced diagnostic
sensitivity and specificity for mediastinal pathology.
Atherosclerosis in the thoracic aorta. Left-sided biventricular
pacemaker with lead tips projecting over the expected location of
the right atrium, right ventricular apex and overlying the anterior
wall of the left ventricle via the coronary sinus and coronary
veins.
IMPRESSION: 1. Appearance of the chest again suggests mild congestive heart
failure.
2. Chronic bilateral scarring related to prior episodes of infection
or inflammation.
3. Atherosclerosis.

## 2016-06-02 ENCOUNTER — Encounter: Payer: Self-pay | Admitting: Internal Medicine

## 2016-09-06 IMAGING — CR DG CHEST 1V PORT
1 series · 1 of 1 positions shown · non-contrast
Comparison: December 09, 2013

CLINICAL DATA: Shortness of breath

EXAM:
PORTABLE CHEST - 1 VIEW

[AP]
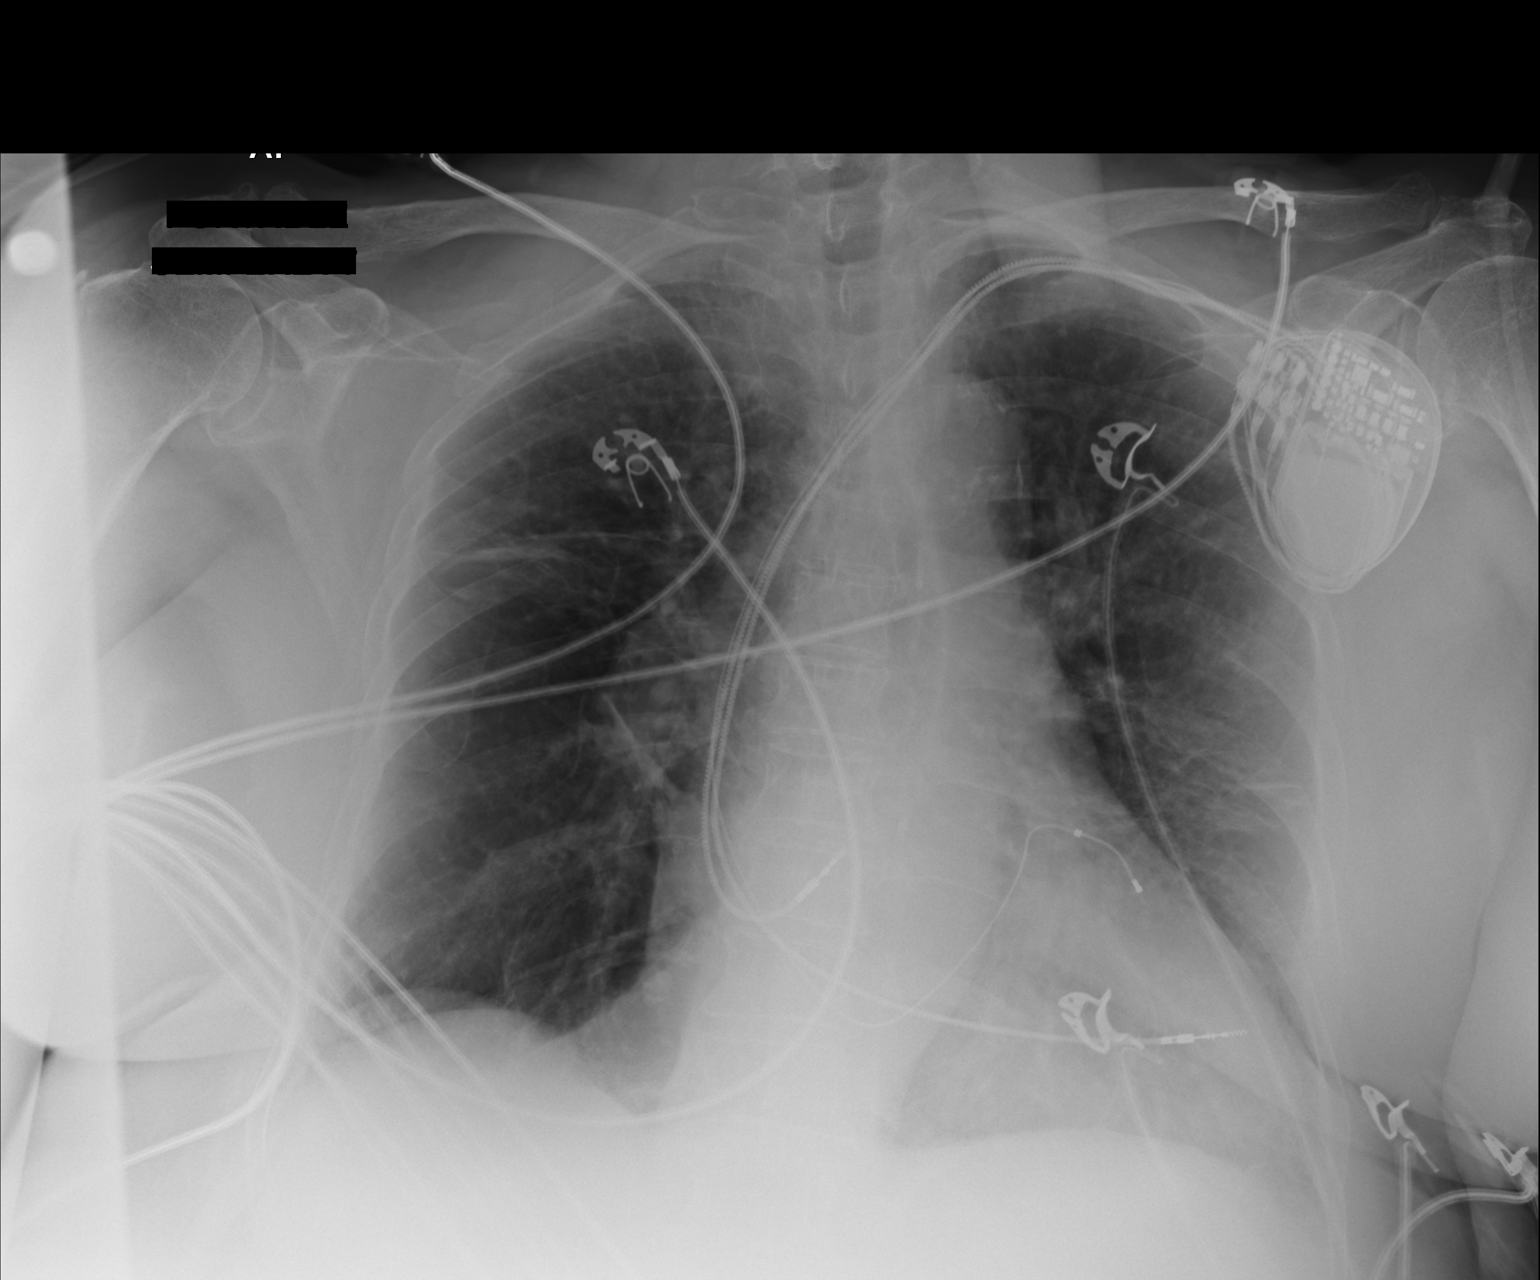

[1 of 1 positions shown; findings below may reference images not displayed]

FINDINGS: The heart size and mediastinal contours are stable. The heart size
is enlarged. Cardiac pacemaker is unchanged. There are mild
atelectasis of the right upper lobe and left mid to lower lung.
There is no focal pneumonia, pulmonary edema, or pleural effusion.
The visualized skeletal structures are stable.
IMPRESSION: Mild atelectasis of both lungs as described. No focal pneumonia or
pulmonary edema.

## 2016-09-10 IMAGING — CR DG CHEST 1V PORT
1 series · 1 of 1 positions shown · non-contrast
Comparison: 06/03/2014

CLINICAL DATA: Subsequent evaluation for pneumonia, shortness of
breath, hypoxia, current history of COPD

EXAM:
PORTABLE CHEST - 1 VIEW

[AP]
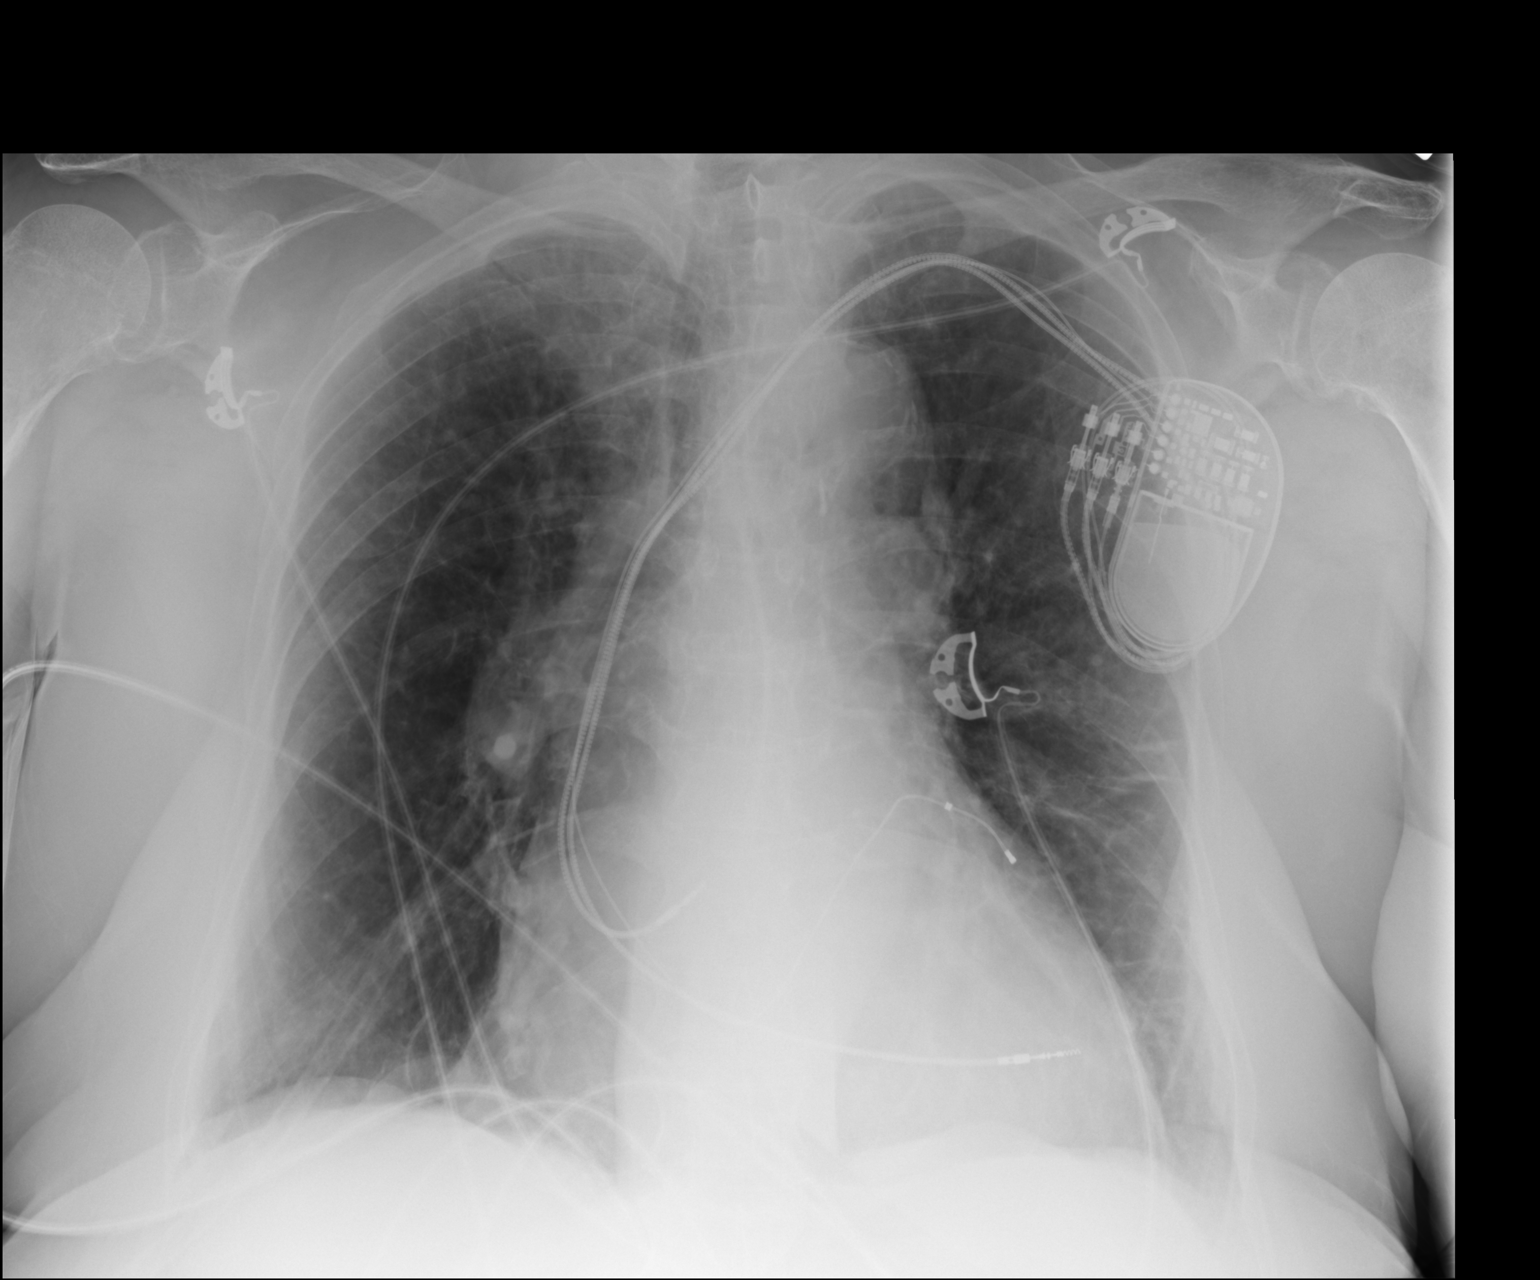

[1 of 1 positions shown; findings below may reference images not displayed]

FINDINGS: Moderate cardiac enlargement. Stable hyperinflation. Mildly
prominent pulmonary arteries suggesting the possibility of pulmonary
artery hypertension.

Bands of well-defined linear density in the lingula, mildly improved
when compared to the prior study. No other areas of airspace
abnormality. No pleural effusion. Stable cardiac pacer.
IMPRESSION: Improving discoid atelectasis in the lingula with with stable
chronic abnormalities.

## 2017-02-09 IMAGING — CT CT CERVICAL SPINE W/O CM
3 of 9 series · 12 of 34 positions shown, 14 images · non-contrast
Comparison: None.

CLINICAL DATA: Unwitnessed fall.  Found on floor.

EXAM:
CT CERVICAL SPINE WITHOUT CONTRAST
TECHNIQUE: Multidetector CT imaging of the cervical spine was performed without
intravenous contrast. Multiplanar CT image reconstructions were also
generated.

[Series 10: coronals · coronal · 0.23mm/px · 1 of 48 slices shown]
[im 24/48  bone]
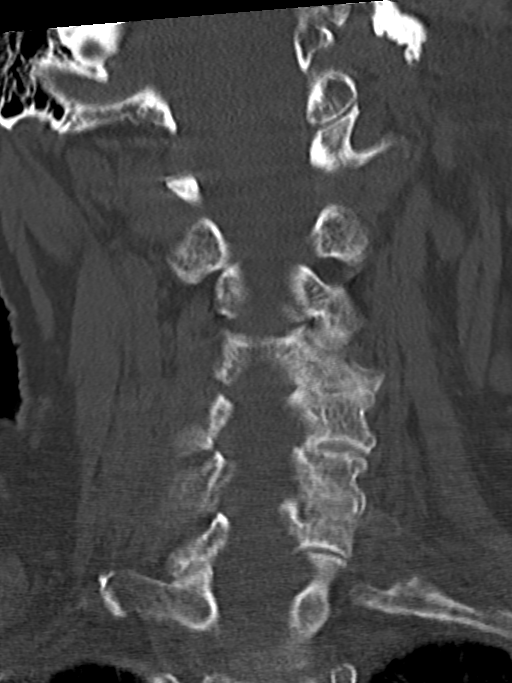

[Series 11: sagittals · sagittal · 0.23mm/px · 6 of 61 slices shown]
[im 7/61  soft-tissue]
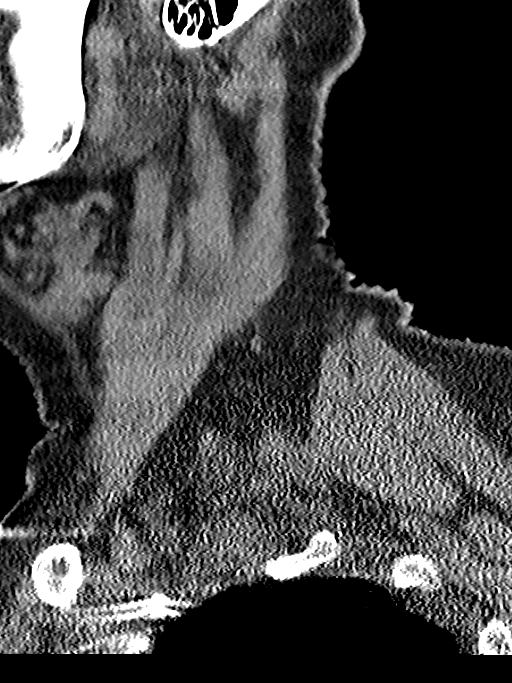
[im 11/61  bone]
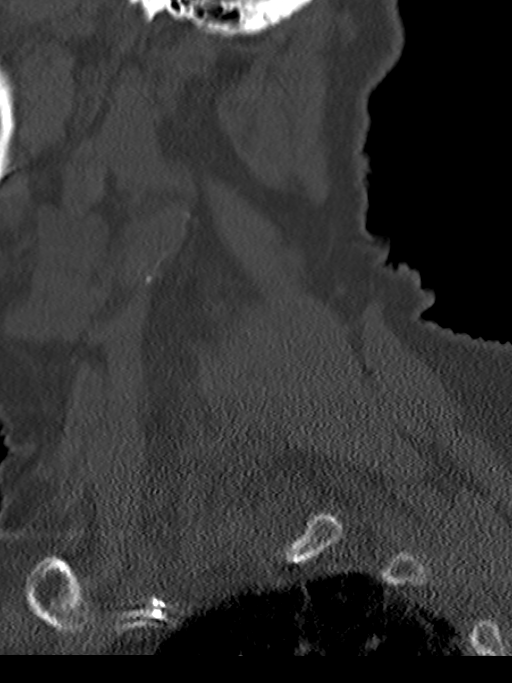
[im 21/61  bone]
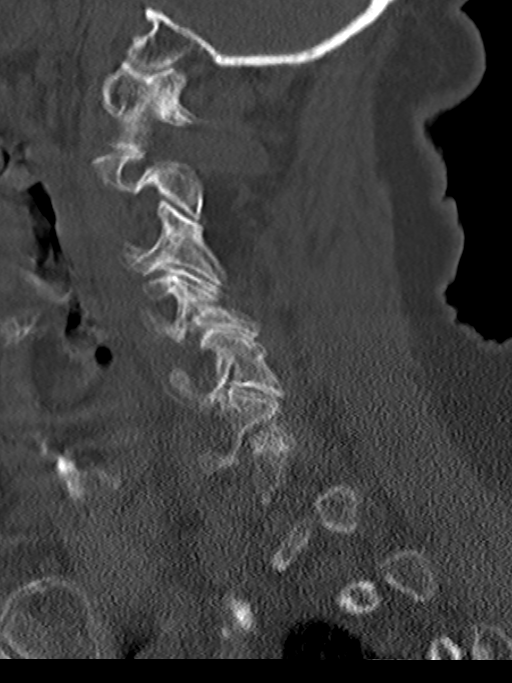
[im 31/61  bone]
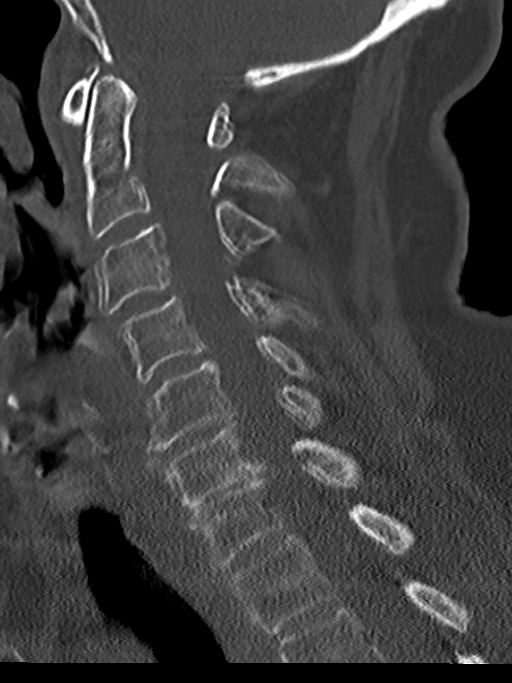
[im 41/61  bone]
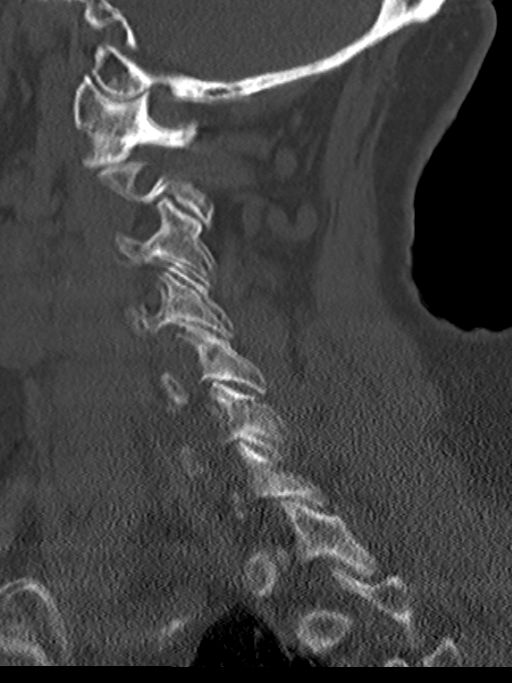
[im 51/61  bone]
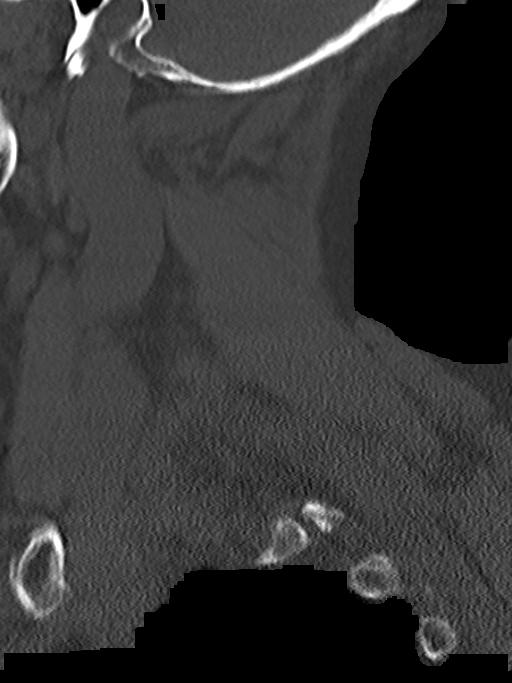

[Series 13: cor · axial · 0.18mm/px · z∈[+16,+124]mm · 5 of 224 slices shown, 7 images]
[im 38/224  soft-tissue]
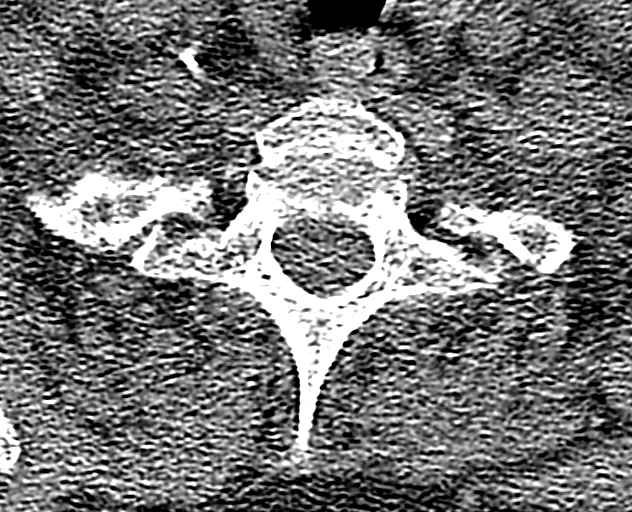
[im 38/224  bone]
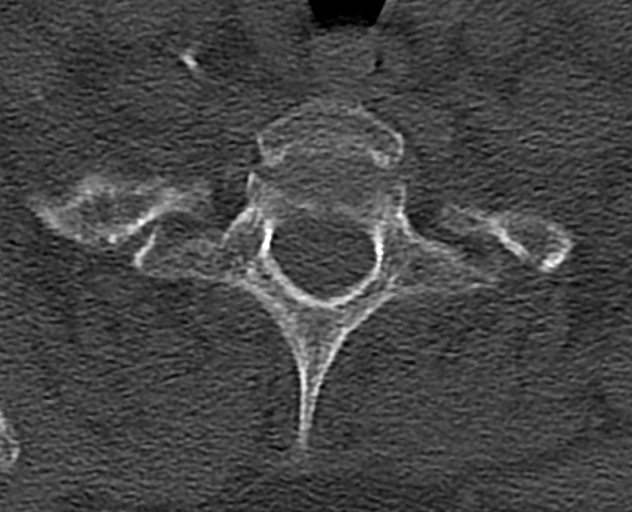
[im 75/224  bone]
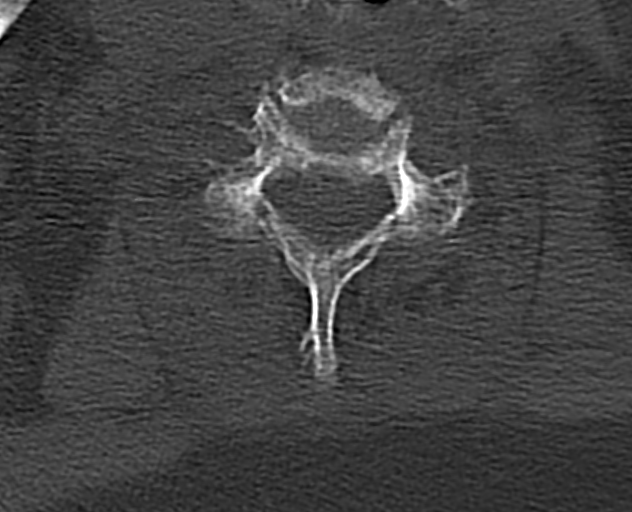
[im 112/224  bone]
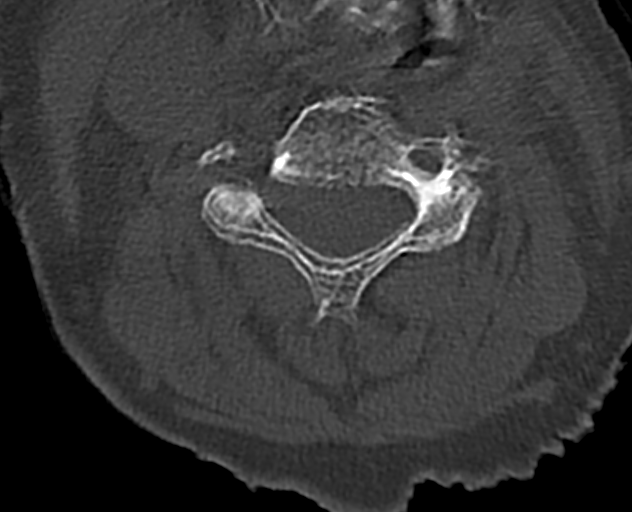
[im 149/224  bone]
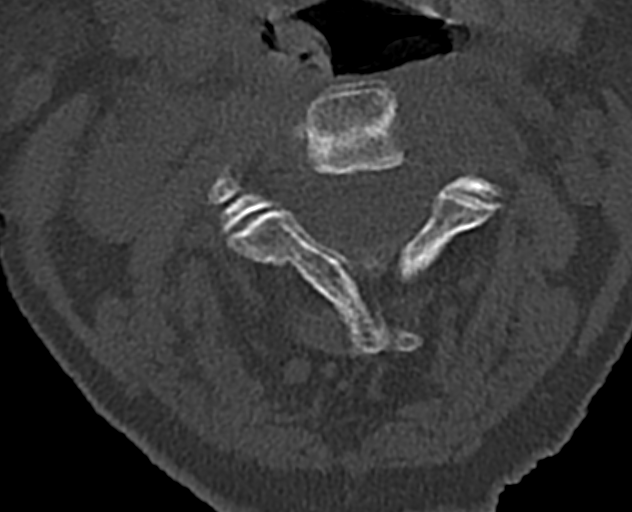
[im 186/224  soft-tissue]
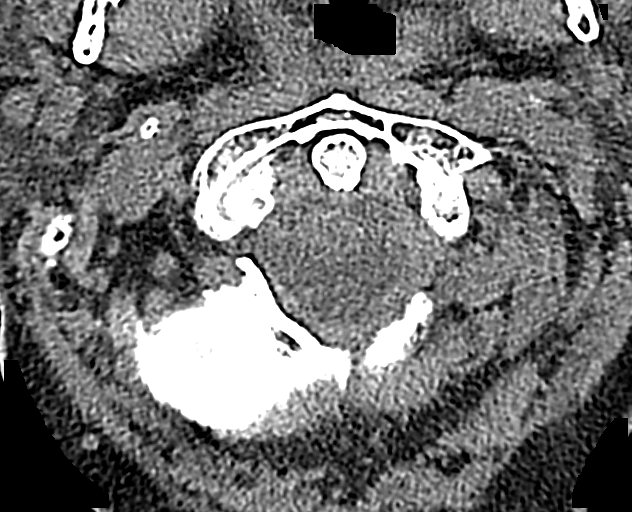
[im 186/224  bone]
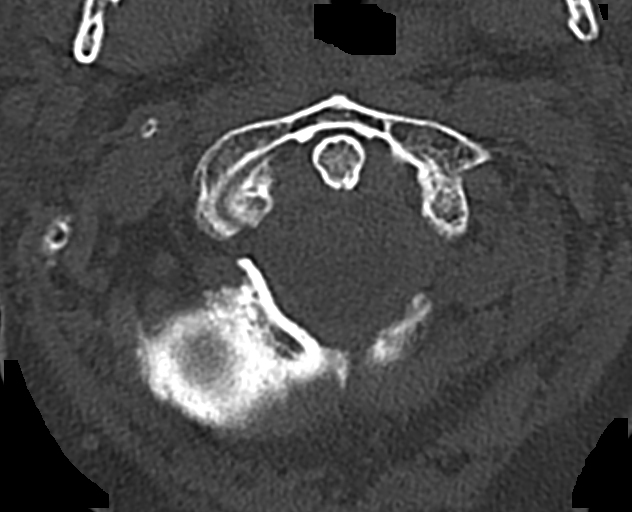

[12 of 34 positions shown; findings below may reference images not displayed]

FINDINGS: There is motion degradation of the images. Vertebral bodies are
normal in height. No acute fracture is evident. No acute soft tissue
abnormality is evident.
IMPRESSION: Negative for acute cervical spine fracture. There is mild limitation
of the study due to motion artifact.

## 2017-02-15 NOTE — Telephone Encounter (Signed)
z

## 2017-02-16 IMAGING — CR DG CHEST 1V PORT
1 series · 1 of 1 positions shown · non-contrast
Comparison: 11/04/2014

CLINICAL DATA: Hypoxia, shortness of breath

EXAM:
PORTABLE CHEST - 1 VIEW

[AP]
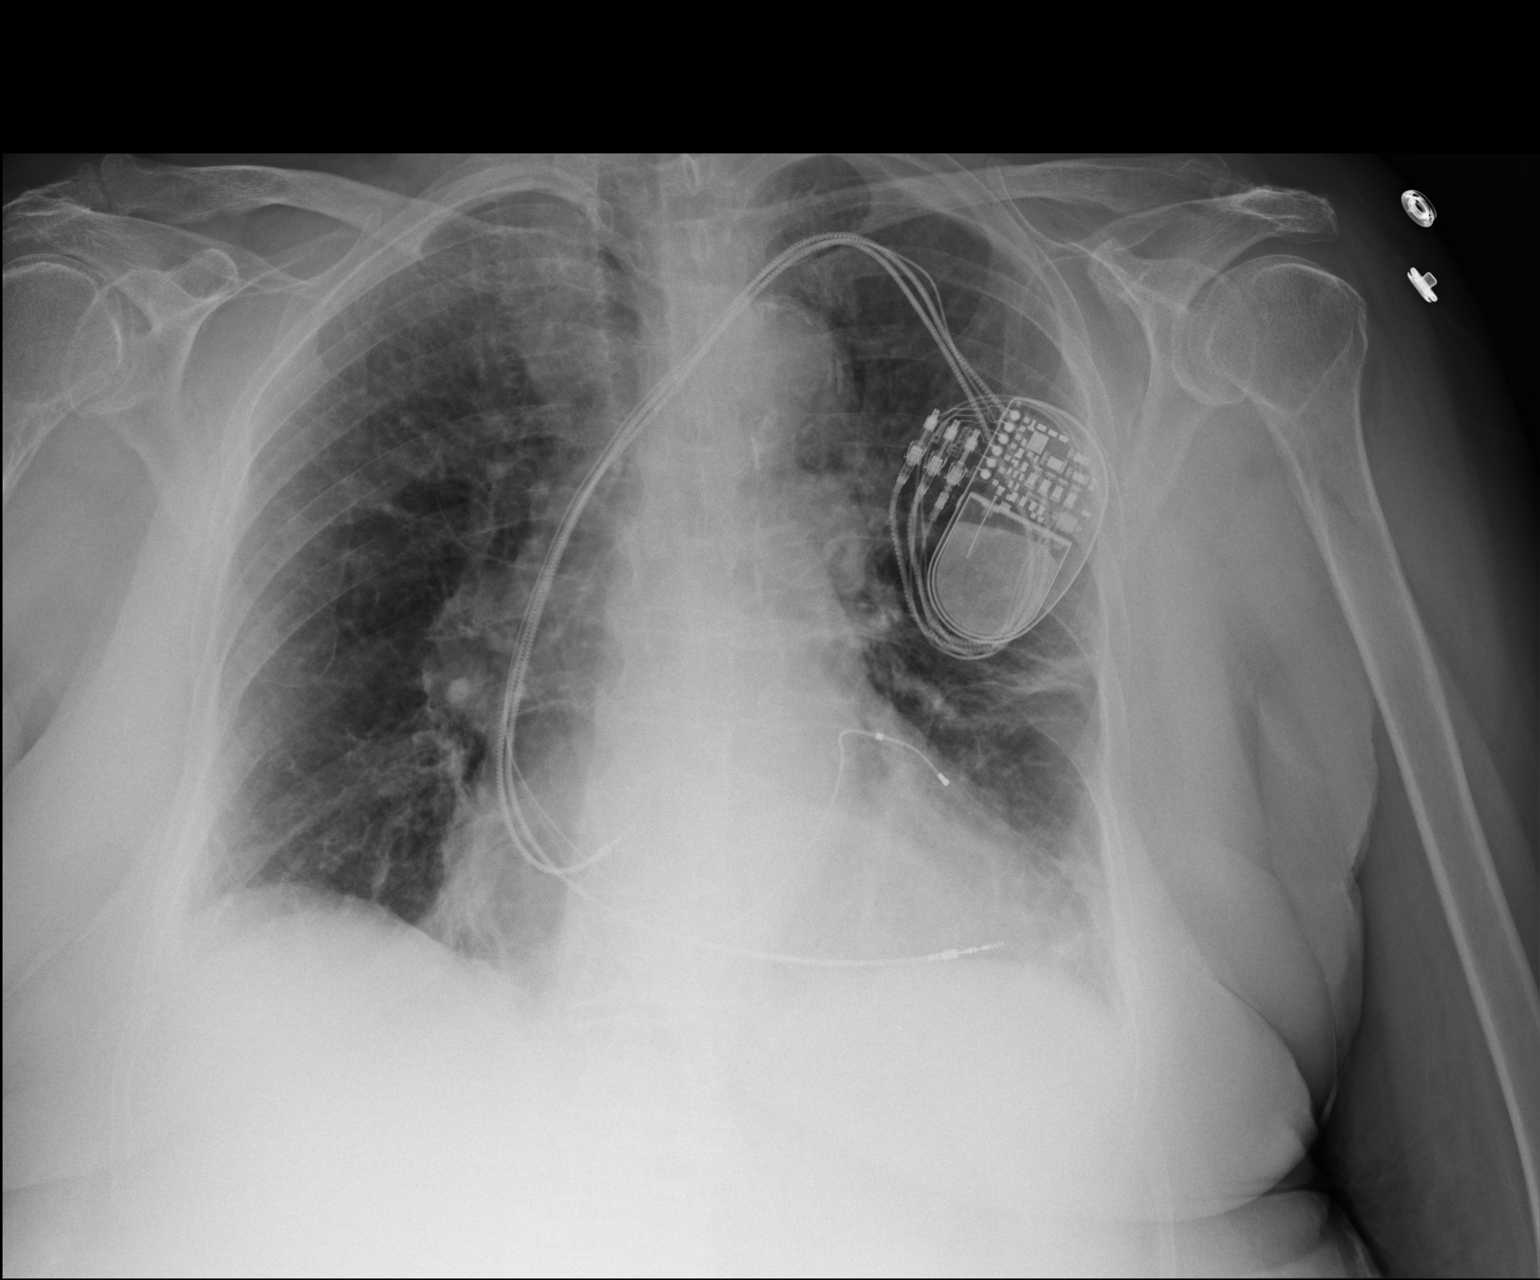

[1 of 1 positions shown; findings below may reference images not displayed]

FINDINGS: Areas is scarring in the left mid and lower lung. There is no focal
parenchymal opacity, pleural effusion, or pneumothorax. There is
mild stable cardiomegaly. There is a 3 lead cardiac pacer.

The osseous structures are unremarkable.
IMPRESSION: No active cardiopulmonary disease.
